# Patient Record
Sex: Female | Born: 1945
Health system: Southern US, Community
[De-identification: ages and names within clinical notes are randomized; demographics above are authoritative.]

## PROBLEM LIST (undated history)

## (undated) DIAGNOSIS — Z9289 Personal history of other medical treatment: Secondary | ICD-10-CM

## (undated) DIAGNOSIS — R112 Nausea with vomiting, unspecified: Secondary | ICD-10-CM

## (undated) DIAGNOSIS — Z923 Personal history of irradiation: Secondary | ICD-10-CM

## (undated) DIAGNOSIS — B1910 Unspecified viral hepatitis B without hepatic coma: Secondary | ICD-10-CM

## (undated) DIAGNOSIS — C679 Malignant neoplasm of bladder, unspecified: Secondary | ICD-10-CM

## (undated) DIAGNOSIS — F329 Major depressive disorder, single episode, unspecified: Secondary | ICD-10-CM

## (undated) DIAGNOSIS — C50312 Malignant neoplasm of lower-inner quadrant of left female breast: Secondary | ICD-10-CM

## (undated) DIAGNOSIS — J449 Chronic obstructive pulmonary disease, unspecified: Secondary | ICD-10-CM

## (undated) DIAGNOSIS — I10 Essential (primary) hypertension: Secondary | ICD-10-CM

## (undated) DIAGNOSIS — Z9889 Other specified postprocedural states: Secondary | ICD-10-CM

## (undated) DIAGNOSIS — C50919 Malignant neoplasm of unspecified site of unspecified female breast: Secondary | ICD-10-CM

## (undated) DIAGNOSIS — Z9221 Personal history of antineoplastic chemotherapy: Secondary | ICD-10-CM

## (undated) DIAGNOSIS — F419 Anxiety disorder, unspecified: Secondary | ICD-10-CM

## (undated) DIAGNOSIS — K219 Gastro-esophageal reflux disease without esophagitis: Secondary | ICD-10-CM

## (undated) DIAGNOSIS — E785 Hyperlipidemia, unspecified: Secondary | ICD-10-CM

## (undated) DIAGNOSIS — F32A Depression, unspecified: Secondary | ICD-10-CM

## (undated) DIAGNOSIS — F319 Bipolar disorder, unspecified: Secondary | ICD-10-CM

## (undated) HISTORY — DX: Unspecified viral hepatitis B without hepatic coma: B19.10

## (undated) HISTORY — DX: Depression, unspecified: F32.A

## (undated) HISTORY — DX: Malignant neoplasm of unspecified site of unspecified female breast: C50.919

## (undated) HISTORY — DX: Hyperlipidemia, unspecified: E78.5

## (undated) HISTORY — DX: Malignant neoplasm of bladder, unspecified: C67.9

## (undated) HISTORY — DX: Gastro-esophageal reflux disease without esophagitis: K21.9

## (undated) HISTORY — PX: ABDOMINAL HYSTERECTOMY: SHX81

## (undated) HISTORY — DX: Personal history of other medical treatment: Z92.89

## (undated) HISTORY — DX: Bipolar disorder, unspecified: F31.9

## (undated) HISTORY — DX: Major depressive disorder, single episode, unspecified: F32.9

## (undated) HISTORY — DX: Malignant neoplasm of lower-inner quadrant of left female breast: C50.312

---
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
## 1989-10-21 HISTORY — PX: PARTIAL HYSTERECTOMY: SHX80

## 1999-08-31 ENCOUNTER — Inpatient Hospital Stay (HOSPITAL_COMMUNITY): Admission: AD | Admit: 1999-08-31 | Discharge: 1999-09-03 | Payer: Self-pay | Admitting: *Deleted

## 1999-09-04 ENCOUNTER — Other Ambulatory Visit: Admission: RE | Admit: 1999-09-04 | Discharge: 1999-09-10 | Payer: Self-pay

## 2004-10-21 HISTORY — PX: OTHER SURGICAL HISTORY: SHX169

## 2005-10-21 HISTORY — PX: BREAST LUMPECTOMY: SHX2

## 2007-04-21 ENCOUNTER — Emergency Department (HOSPITAL_COMMUNITY): Admission: EM | Admit: 2007-04-21 | Discharge: 2007-04-21 | Payer: Self-pay | Admitting: Family Medicine

## 2011-04-17 ENCOUNTER — Encounter: Payer: Self-pay | Admitting: Family Medicine

## 2011-04-17 ENCOUNTER — Ambulatory Visit (INDEPENDENT_AMBULATORY_CARE_PROVIDER_SITE_OTHER): Payer: Medicare Other | Admitting: Family Medicine

## 2011-04-17 DIAGNOSIS — E119 Type 2 diabetes mellitus without complications: Secondary | ICD-10-CM

## 2011-04-17 DIAGNOSIS — J45909 Unspecified asthma, uncomplicated: Secondary | ICD-10-CM

## 2011-04-17 DIAGNOSIS — F319 Bipolar disorder, unspecified: Secondary | ICD-10-CM | POA: Insufficient documentation

## 2011-04-17 DIAGNOSIS — E785 Hyperlipidemia, unspecified: Secondary | ICD-10-CM

## 2011-04-17 DIAGNOSIS — K219 Gastro-esophageal reflux disease without esophagitis: Secondary | ICD-10-CM

## 2011-04-17 DIAGNOSIS — E1165 Type 2 diabetes mellitus with hyperglycemia: Secondary | ICD-10-CM | POA: Insufficient documentation

## 2011-04-17 DIAGNOSIS — C50919 Malignant neoplasm of unspecified site of unspecified female breast: Secondary | ICD-10-CM

## 2011-04-17 DIAGNOSIS — C679 Malignant neoplasm of bladder, unspecified: Secondary | ICD-10-CM | POA: Insufficient documentation

## 2011-04-17 LAB — CBC WITH DIFFERENTIAL/PLATELET
Basophils Absolute: 0 10*3/uL (ref 0.0–0.1)
Basophils Relative: 0.5 % (ref 0.0–3.0)
Eosinophils Absolute: 0.2 10*3/uL (ref 0.0–0.7)
Eosinophils Relative: 3.5 % (ref 0.0–5.0)
HCT: 42.1 % (ref 36.0–46.0)
Hemoglobin: 14.3 g/dL (ref 12.0–15.0)
Lymphocytes Relative: 24.4 % (ref 12.0–46.0)
Lymphs Abs: 1.2 10*3/uL (ref 0.7–4.0)
MCHC: 33.9 g/dL (ref 30.0–36.0)
MCV: 95.6 fl (ref 78.0–100.0)
Monocytes Absolute: 0.4 10*3/uL (ref 0.1–1.0)
Monocytes Relative: 8.4 % (ref 3.0–12.0)
Neutro Abs: 3.1 10*3/uL (ref 1.4–7.7)
Neutrophils Relative %: 63.2 % (ref 43.0–77.0)
Platelets: 158 10*3/uL (ref 150.0–400.0)
RBC: 4.4 Mil/uL (ref 3.87–5.11)
RDW: 13.2 % (ref 11.5–14.6)
WBC: 4.9 10*3/uL (ref 4.5–10.5)

## 2011-04-17 LAB — LIPID PANEL
Cholesterol: 198 mg/dL (ref 0–200)
HDL: 45.1 mg/dL (ref >39.00)
LDL Cholesterol: 137 mg/dL — ABNORMAL HIGH (ref 0–99)
Total CHOL/HDL Ratio: 4
Triglycerides: 82 mg/dL (ref 0.0–149.0)
VLDL: 16.4 mg/dL (ref 0.0–40.0)

## 2011-04-17 LAB — BASIC METABOLIC PANEL
BUN: 10 mg/dL (ref 6–23)
CO2: 25 mEq/L (ref 19–32)
Calcium: 8.8 mg/dL (ref 8.4–10.5)
Chloride: 109 mEq/L (ref 96–112)
Creatinine, Ser: 0.7 mg/dL (ref 0.4–1.2)
GFR: 97.22 mL/min (ref >60.00)
Glucose, Bld: 110 mg/dL — ABNORMAL HIGH (ref 70–99)
Potassium: 4.3 mEq/L (ref 3.5–5.1)
Sodium: 142 mEq/L (ref 135–145)

## 2011-04-17 LAB — HEPATIC FUNCTION PANEL
ALT: 30 U/L (ref 0–35)
AST: 27 U/L (ref 0–37)
Albumin: 4 g/dL (ref 3.5–5.2)
Alkaline Phosphatase: 54 U/L (ref 39–117)
Bilirubin, Direct: 0.1 mg/dL (ref 0.0–0.3)
Total Bilirubin: 0.6 mg/dL (ref 0.3–1.2)
Total Protein: 6.3 g/dL (ref 6.0–8.3)

## 2011-04-17 LAB — HEMOGLOBIN A1C: Hgb A1c MFr Bld: 6.7 % — ABNORMAL HIGH (ref 4.6–6.5)

## 2011-04-17 LAB — TSH: TSH: 1.03 u[IU]/mL (ref 0.35–5.50)

## 2011-04-17 MED ORDER — ALBUTEROL SULFATE HFA 108 (90 BASE) MCG/ACT IN AERS: 2.0000 | INHALATION_SPRAY | RESPIRATORY_TRACT | Status: DC | PRN

## 2011-04-17 MED ORDER — ATORVASTATIN CALCIUM 20 MG PO TABS
20.0000 mg | ORAL_TABLET | Freq: Every day | ORAL | Status: DC
Start: 2011-04-17 — End: 2011-07-25

## 2011-04-17 NOTE — Patient Instructions (Signed)
Please schedule your complete physical in the next 4-6 weeks We'll notify you of your lab results Someone will call you with your urology and oncology appts Call with any questions or concerns Welcome!  We're glad to have you!

## 2011-04-17 NOTE — Progress Notes (Signed)
  Subjective:    Patient ID: Michelle Long, female    DOB: 1946/04/29, 65 y.o.   MRN: [id number]  HPI New to establish care.  Previous MD- Landry Mellow at [place].  Bipolar- sees Dr Betti Cruz and a therapist regularly for this.  On Lamictal, klonopin, fanapt.  Feels sxs are currently well controlled.  Bladder Cancer- dx'd 2006, was seeing Dr Sabino Gasser in [place].  Was seeing him every 6-12 months.  S/p chemo txs.  Would prefer to see [facility].  Breast Cancer- dx'd 2007, was seeing Dr Fredricka Bonine at [facility].  Currently on Tamoxifen.  Had lumpectomy, chemo and radiation.  Was following w/ her every 6 months.  Would like to see new oncologist.  DM- reports this is secondary to antipsychotics.  dx'd 2 yrs ago.  Not currently on meds, reports 'A1C is great'.  Controlling sugars w/ diet and exercise.  Rarely checks sugars at home.  Does get yearly eye exams.  Denies neuropathy.  Hyperlipidemia- chronic problem for pt.  Previously on Pravachol but has not taken this 'in a long time'.  Asthma/COPD- chronic problem for pt, rarely uses albuterol but takes Symbicort daily.  Was having severe problems w/ GERD.    Review of Systems For ROS see HPI     Objective:   Physical Exam  Constitutional: She is oriented to person, place, and time. She appears well-developed and well-nourished. No distress.  HENT:  Head: Normocephalic and atraumatic.  Eyes: Conjunctivae and EOM are normal. Pupils are equal, round, and reactive to light.  Neck: Normal range of motion. Neck supple. No thyromegaly present.  Cardiovascular: Normal rate, regular rhythm, normal heart sounds and intact distal pulses.   No murmur heard. Pulmonary/Chest: Effort normal and breath sounds normal. No respiratory distress.  Abdominal: Soft. She exhibits no distension. There is no tenderness.  Musculoskeletal: She exhibits no edema.  Lymphadenopathy:    She has no cervical adenopathy.  Neurological: She is alert and oriented to person,  place, and time.  Skin: Skin is warm and dry.  Psychiatric: She has a normal mood and affect. Her behavior is normal.          Assessment & Plan:

## 2011-04-30 NOTE — Assessment & Plan Note (Signed)
2/2 antipsychotic use and weight gain.  Pt reports she has been managing this w/ diet and exercise.  Check labs.  Start meds prn.

## 2011-04-30 NOTE — Assessment & Plan Note (Signed)
Following w/ psych.  sxs currently well controlled per pt report.  No changes.

## 2011-04-30 NOTE — Assessment & Plan Note (Signed)
Pt reports this was a severe problem for her but is now doing well on protonix.  Currently sx free.  Continue meds.

## 2011-04-30 NOTE — Assessment & Plan Note (Signed)
Pt prefers to establish care in [place]- refer to oncology.

## 2011-04-30 NOTE — Assessment & Plan Note (Signed)
Check labs to assess starting point and prescribe appropriate statin based on results.  LDL goal i <70 w/ DM

## 2011-04-30 NOTE — Assessment & Plan Note (Signed)
Pt rarely using rescue inhaler.  Taking symbicort daily as controller med.  She is not sure if she truly has asthma or her lung problem was due to her severe GERD at the time.  May need PFTs in the future to assess.

## 2011-04-30 NOTE — Assessment & Plan Note (Signed)
Was following w/ urology in [place].  Would prefer to transfer her care to [place].  Will refer to [facility].

## 2011-05-01 ENCOUNTER — Other Ambulatory Visit: Payer: Self-pay | Admitting: Family Medicine

## 2011-05-01 MED ORDER — BUDESONIDE-FORMOTEROL FUMARATE 160-4.5 MCG/ACT IN AERO: 2.0000 | INHALATION_SPRAY | Freq: Two times a day (BID) | RESPIRATORY_TRACT | Status: DC

## 2011-05-01 NOTE — Telephone Encounter (Signed)
Refill sent.

## 2011-05-09 ENCOUNTER — Other Ambulatory Visit: Payer: Self-pay | Admitting: Family Medicine

## 2011-05-09 ENCOUNTER — Ambulatory Visit: Payer: Medicare Other | Admitting: Hematology & Oncology

## 2011-05-09 DIAGNOSIS — Z9889 Other specified postprocedural states: Secondary | ICD-10-CM

## 2011-05-09 DIAGNOSIS — Z853 Personal history of malignant neoplasm of breast: Secondary | ICD-10-CM

## 2011-05-15 ENCOUNTER — Ambulatory Visit: Payer: Self-pay | Admitting: Family Medicine

## 2011-05-15 ENCOUNTER — Ambulatory Visit (INDEPENDENT_AMBULATORY_CARE_PROVIDER_SITE_OTHER): Payer: Medicare Other | Admitting: Family Medicine

## 2011-05-15 ENCOUNTER — Telehealth: Payer: Self-pay | Admitting: *Deleted

## 2011-05-15 VITALS — BP 120/74 | Temp 98.7°F | Wt 167.0 lb

## 2011-05-15 DIAGNOSIS — R300 Dysuria: Secondary | ICD-10-CM

## 2011-05-15 DIAGNOSIS — L853 Xerosis cutis: Secondary | ICD-10-CM

## 2011-05-15 DIAGNOSIS — R238 Other skin changes: Secondary | ICD-10-CM

## 2011-05-15 LAB — POCT URINALYSIS DIPSTICK
Bilirubin, UA: NEGATIVE
Glucose, UA: NEGATIVE
Ketones, UA: NEGATIVE
Nitrite, UA: NEGATIVE
Protein, UA: NEGATIVE
Spec Grav, UA: 1.02
Urobilinogen, UA: 0.2
pH, UA: 5

## 2011-05-15 MED ORDER — CIPROFLOXACIN HCL 500 MG PO TABS: 500.0000 mg | ORAL_TABLET | Freq: Two times a day (BID) | ORAL | Status: AC

## 2011-05-15 NOTE — Telephone Encounter (Signed)
Pt has pending appt with Dr Beverely Low @ 9:30 am today.

## 2011-05-15 NOTE — Assessment & Plan Note (Signed)
Itching is most likely due to pt's dry skin.  Reviewed that in excessive humidity she will need to moisturize skin due to insensible losses.  Pt understands.

## 2011-05-15 NOTE — Progress Notes (Signed)
  Subjective:    Patient ID: Michelle Long, female    DOB: September 03, 1946, 65 y.o.   MRN: [id number]  HPI ? UTI- sxs started yesterday w/ dysuria.  Started drinking increased fluids and cranberry juice w/ some relief this AM.  Was having increased frequency and urgency overnight.  Hx of similar.  Diffuse intermittent body itching- has started just recently.  No pattern to sxs.  Review of Systems For ROS see HPI     Objective:   Physical Exam  Constitutional: She appears well-developed and well-nourished. No distress.  Abdominal: Soft. Bowel sounds are normal. She exhibits no distension. There is no tenderness. There is no rebound.  Skin: Skin is warm and dry. No rash noted.          Assessment & Plan:

## 2011-05-15 NOTE — Patient Instructions (Signed)
Take the cipro as directed for the UTI Drink plenty of fluids Make sure to keep your skin moisturized to prevent itching Call with any questions or concerns Have a great summer!!!

## 2011-05-15 NOTE — Assessment & Plan Note (Signed)
UA consistent w/ infxn.  Start abx and send urine for cx.

## 2011-05-15 NOTE — Telephone Encounter (Signed)
[facility] Triage Call Report Triage Record Num: [id number] Operator: Karma Greaser Patient Name: Michelle Long Call Date & Time: 05/14/2011 6:40:33PM Patient Phone: [phone] PCP: Patient Gender: Female PCP Fax : Patient DOB: 1946-04-03 Practice Name: [facility] Reason for Call: Urinary symptoms began 05/11/11 but worsening 05/13/11. Burning and not able to pass all of her urine. All emergent s/s of Urinary Symptoms r/o except has one or more urinary tract symptoms and has not been previously evaluated. Told her to see Provider in 24 hours. Protocol(s) Used: Urinary Symptoms - Female Recommended Outcome per Protocol: See Provider within 24 hours Reason for Outcome: Has one or more urinary tract symptoms AND has not been previously evaluated Care Advice: ~ Tell provider medical history of renal disease; especially if have only one kidney. ~ Call provider if you develop flank or low back pain, fever, generally feel sick. Increase intake of fluids. Try to drink 8 oz. (.2 liter) every hour when awake, including unsweetened cranberry juice, unless on restricted fluids for other medical reasons. Take sips of fluid or eat ice chips if nauseated or vomiting. ~ ~ Tell your provider if you are taking Warfarin (Coumadin) and drinking cranberry juice or taking cranberry capsules. ~ SYMPTOM / CONDITION MANAGEMENT ~ CAUTIONS Limit carbonated, alcoholic, and caffeinated beverages such as coffee, tea and soda. Avoid nonprescription cold and allergy medications that contain caffeine. Limit intake of tomatoes, fruit juices (except for unsweetened cranberry juice), dairy products, spicy foods, sugar, and artificial sweeteners (aspartame or saccharine). Stop or decrease smoking. Reducing exposure to bladder irritants may help lessen urgency. ~ Analgesic/Antipyretic Advice - Acetaminophen: Consider acetaminophen as directed on label or by pharmacist/provider for pain or  fever PRECAUTIONS: - Use if there is no history of liver disease, alcoholism, or intake of three or more alcohol drinks per day - Only if approved by provider during pregnancy or when breastfeeding - During pregnancy, acetaminophen should not be taken more than 3 consecutive days without telling provider - Do not exceed recommended dose or frequency ~ ~ Call provider if urine is pink, red, smoky or cola colored. Systemic Inflammatory Response Syndrome (SIRS): Watch for signs of a generalized, whole body infection. Occurs within days of a localized infection, especially of the urinary, GI, respiratory or nervous systems; or after a traumatic injury or invasive procedure. - Call EMS 911 if symptoms have worsened, such as increasing confusion or unusual drowsiness; cold and clammy skin; no urine output; rapid respiration (>30/min.) or slow respiration (<10/min.); struggling to breathe. - Go to the ED immediately for early symptoms of rapid pulse >90/min. or rapid breathing >20/min. at rest; chills; oral temperature >100.4 F (38 C) or <96.8 F (36 C) when associated with conditions noted. ~ Analgesic/Antipyretic Advice - NSAIDs: Consider aspirin, ibuprofen, naproxen or ketoprofen for pain or fever as directed on label or by pharmacist/provider. PRECAUTIONS: - If over 43 years of age, should not take longer than 1 week without consulting provider. EXCEPTIONS: - Should not be used if taking blood thinners or have bleeding problems. - Do not use if have history of sensitivity/allergy to any of these medications; or history of cardiovascular, ulcer, kidney, liver disease or diabetes unless approved by provider.

## 2011-05-17 ENCOUNTER — Ambulatory Visit
Admission: RE | Admit: 2011-05-17 | Discharge: 2011-05-17 | Disposition: A | Discharge status: Home / Self Care | Payer: Medicare Other | Source: Ambulatory Visit | Attending: Family Medicine | Admitting: Family Medicine

## 2011-05-17 DIAGNOSIS — Z9889 Other specified postprocedural states: Secondary | ICD-10-CM

## 2011-05-17 DIAGNOSIS — Z853 Personal history of malignant neoplasm of breast: Secondary | ICD-10-CM

## 2011-05-17 LAB — URINE CULTURE

## 2011-05-29 ENCOUNTER — Ambulatory Visit (INDEPENDENT_AMBULATORY_CARE_PROVIDER_SITE_OTHER): Payer: Medicare Other | Admitting: Family Medicine

## 2011-05-29 ENCOUNTER — Encounter: Payer: Self-pay | Admitting: Family Medicine

## 2011-05-29 DIAGNOSIS — F319 Bipolar disorder, unspecified: Secondary | ICD-10-CM

## 2011-05-29 DIAGNOSIS — E559 Vitamin D deficiency, unspecified: Secondary | ICD-10-CM | POA: Insufficient documentation

## 2011-05-29 DIAGNOSIS — Z8742 Personal history of other diseases of the female genital tract: Secondary | ICD-10-CM | POA: Insufficient documentation

## 2011-05-29 DIAGNOSIS — Z0000 Encounter for general adult medical examination without abnormal findings: Secondary | ICD-10-CM

## 2011-05-29 DIAGNOSIS — Z136 Encounter for screening for cardiovascular disorders: Secondary | ICD-10-CM

## 2011-05-29 NOTE — Patient Instructions (Signed)
Follow up in 2 months to recheck diabetes- sooner if needed We'll call you with your bone density and GYN appt Continue your current meds Your exam looks great!  Keep up the good work! Call with any questions or concerns Hang in there!  You're doing great!

## 2011-05-29 NOTE — Progress Notes (Signed)
  Subjective:    Patient ID: Michelle Long, female    DOB: 1945/12/09, 65 y.o.   MRN: [id number]  HPI Here today for CPE.  Risk Factors: Depression/Anxiety- pt admits to increased difficulty recently, has not been seeing therapist regularly- plans on making appt.  Is not taking Fanapt as directed due to sleepiness.  Feels this is why BP is elevated.  Admits to increased anxiety. Vit D deficiency- taking 1000 IUs daily in form of supplement, plus 200 IUs in Calcium supplement.  Due for DEXA scan. Physical Activity: walking regularly Fall risk: low Depression: ongoing issue, sees Psych regularly Hearing: no concerns, normal to whispered voice at 6 ft ADL's: independent Cognitive: normal linear thought process, no deficits noted Home Safety: feels safe at home, lives alone. Height, Weight, BMI, Visual Acuity: see vitals, vision corrected to 20/20 w/ glasses Counseling: UTD on colonoscopy, mammograms.  Overdue on pap and dexa.  Has had Pneumovax. Labs Ordered: See A&P Care Plan: See A&P    Review of Systems Patient reports no vision/ hearing changes, adenopathy,fever, weight change,  persistant/recurrent hoarseness , swallowing issues, chest pain, palpitations, edema, persistant/recurrent cough, hemoptysis, dyspnea (rest/exertional/paroxysmal nocturnal), gastrointestinal bleeding (melena, rectal bleeding), abdominal pain, significant heartburn, bowel changes, GU symptoms (dysuria, hematuria, incontinence), Gyn symptoms (abnormal  bleeding, pain),  syncope, focal weakness, memory loss, numbness & tingling, skin/hair/nail changes, abnormal bruising or bleeding.    Objective:   Physical Exam  General Appearance:    Alert, cooperative, no distress, appears stated age  Head:    Normocephalic, without obvious abnormality, atraumatic  Eyes:    PERRL, conjunctiva/corneas clear, EOM's intact, fundi    benign, both eyes  Ears:    Normal TM's and external ear canals, both ears  Nose:   Nares normal,  septum midline, mucosa normal, no drainage    or sinus tenderness  Throat:   Lips, mucosa, and tongue normal; teeth and gums normal  Neck:   Supple, symmetrical, trachea midline, no adenopathy;    Thyroid: no enlargement/tenderness/nodules  Back:     Symmetric, no curvature, ROM normal, no CVA tenderness  Lungs:     Clear to auscultation bilaterally, respirations unlabored  Chest Wall:    No tenderness or deformity   Heart:    Regular rate and rhythm, S1 and S2 normal, no murmur, rub   or gallop  Breast Exam:    Deferred to GYN  Abdomen:     Soft, non-tender, bowel sounds active all four quadrants,    no masses, no organomegaly  Genitalia:    Deferred to GYN  Rectal:    Deferred to GYN  Extremities:   Extremities normal, atraumatic, no cyanosis or edema  Pulses:   2+ and symmetric all extremities  Skin:   Skin color, texture, turgor normal, no rashes or lesions  Lymph nodes:   Cervical, supraclavicular, and axillary nodes normal  Neurologic:   CNII-XII intact, normal strength, sensation and reflexes    throughout          Assessment & Plan:

## 2011-05-30 ENCOUNTER — Encounter (HOSPITAL_BASED_OUTPATIENT_CLINIC_OR_DEPARTMENT_OTHER): Payer: Medicare Other | Admitting: Hematology & Oncology

## 2011-05-30 ENCOUNTER — Other Ambulatory Visit: Payer: Self-pay | Admitting: Hematology & Oncology

## 2011-05-30 DIAGNOSIS — C50419 Malignant neoplasm of upper-outer quadrant of unspecified female breast: Secondary | ICD-10-CM

## 2011-05-30 LAB — CBC WITH DIFFERENTIAL (CANCER CENTER ONLY)
BASO#: 0 10*3/uL (ref 0.0–0.2)
BASO%: 0.2 % (ref 0.0–2.0)
EOS%: 1.4 % (ref 0.0–7.0)
Eosinophils Absolute: 0.2 10*3/uL (ref 0.0–0.5)
HCT: 40.3 % (ref 34.8–46.6)
HGB: 14.1 g/dL (ref 11.6–15.9)
LYMPH#: 1.7 10*3/uL (ref 0.9–3.3)
LYMPH%: 16.2 % (ref 14.0–48.0)
MCH: 32.5 pg (ref 26.0–34.0)
MCHC: 35 g/dL (ref 32.0–36.0)
MCV: 93 fL (ref 81–101)
MONO#: 0.8 10*3/uL (ref 0.1–0.9)
MONO%: 7.2 % (ref 0.0–13.0)
NEUT#: 7.8 10*3/uL — ABNORMAL HIGH (ref 1.5–6.5)
NEUT%: 75 % (ref 39.6–80.0)
Platelets: 145 10*3/uL (ref 145–400)
RBC: 4.34 10*6/uL (ref 3.70–5.32)
RDW: 12 % (ref 11.1–15.7)
WBC: 10.4 10*3/uL — ABNORMAL HIGH (ref 3.9–10.0)

## 2011-05-30 LAB — COMPREHENSIVE METABOLIC PANEL
ALT: 30 U/L (ref 0–35)
AST: 24 U/L (ref 0–37)
Albumin: 4.3 g/dL (ref 3.5–5.2)
Alkaline Phosphatase: 64 U/L (ref 39–117)
BUN: 12 mg/dL (ref 6–23)
CO2: 25 mEq/L (ref 19–32)
Calcium: 9.7 mg/dL (ref 8.4–10.5)
Chloride: 107 mEq/L (ref 96–112)
Creatinine, Ser: 0.75 mg/dL (ref 0.50–1.10)
Glucose, Bld: 95 mg/dL (ref 70–99)
Potassium: 3.9 mEq/L (ref 3.5–5.3)
Sodium: 139 mEq/L (ref 135–145)
Total Bilirubin: 0.4 mg/dL (ref 0.3–1.2)
Total Protein: 6.1 g/dL (ref 6.0–8.3)

## 2011-05-30 LAB — VITAMIN D 25 HYDROXY (VIT D DEFICIENCY, FRACTURES): Vit D, 25-Hydroxy: 28 ng/mL — ABNORMAL LOW (ref 30–89)

## 2011-05-31 ENCOUNTER — Telehealth: Payer: Self-pay

## 2011-05-31 LAB — VITAMIN D 1,25 DIHYDROXY
Vitamin D 1, 25 (OH)2 Total: 86 pg/mL — ABNORMAL HIGH (ref 18–72)
Vitamin D2 1, 25 (OH)2: <8 pg/mL
Vitamin D3 1, 25 (OH)2: 86 pg/mL

## 2011-05-31 NOTE — Telephone Encounter (Signed)
Message copied by Beverely Low on Fri May 31, 2011  3:31 PM ------      Message from: Sheliah Hatch      Created: Fri May 31, 2011  8:01 AM       Vit D is elevated- should decrease the amount of her supplement.  She is taking 1000 units daily + her Ca/D + multi vitamin.  Can stop the 1000 units daily for now and will recheck in 6 months.

## 2011-05-31 NOTE — Telephone Encounter (Signed)
Pt.notified

## 2011-06-02 NOTE — Assessment & Plan Note (Signed)
Pt's PE WNL.  Reviewed labs from last visit.  Only thing missing is Vit D level- will get this today.  UTD on health maintenance- w/ exception of pap and dexa.  Will order DEXA and refer to GYN for pap due to hx of abnormal paps and hx of cancer.  Pt expressed understanding and is in agreement w/ plan.

## 2011-06-02 NOTE — Assessment & Plan Note (Signed)
Refer to GYN. 

## 2011-06-02 NOTE — Assessment & Plan Note (Signed)
Check labs and get DEXA

## 2011-06-02 NOTE — Assessment & Plan Note (Signed)
Pt extremely anxious over recently changing doctors.  Says she needed to change for a reason she won't divulge but this makes her very nervous.  Reassured her that the specialists she is seeing are excellent- this made her happy.  Encouraged her to schedule a f/u w/ her therapist- she plans to.  Will follow.

## 2011-06-04 ENCOUNTER — Ambulatory Visit (INDEPENDENT_AMBULATORY_CARE_PROVIDER_SITE_OTHER): Payer: Medicare Other | Admitting: Family

## 2011-06-04 VITALS — BP 142/72 | HR 72 | Temp 98.1°F | Ht 64.0 in | Wt 168.0 lb

## 2011-06-04 DIAGNOSIS — N390 Urinary tract infection, site not specified: Secondary | ICD-10-CM

## 2011-06-04 DIAGNOSIS — R350 Frequency of micturition: Secondary | ICD-10-CM

## 2011-06-04 LAB — POCT URINALYSIS DIPSTICK
Glucose, UA: NEGATIVE
Ketones, UA: NEGATIVE
Nitrite, UA: NEGATIVE
Spec Grav, UA: 1.005
Urobilinogen, UA: 0.2
pH, UA: 6

## 2011-06-04 MED ORDER — CEFUROXIME AXETIL 500 MG PO TABS: 500.0000 mg | ORAL_TABLET | Freq: Two times a day (BID) | ORAL | Status: AC

## 2011-06-04 NOTE — Assessment & Plan Note (Signed)
Symptoms and UA are suggestive of UTI.  Will send urine for culture and plan to treat with ceftin. Pt is instructed to call for f/u as outlined in after visit summary.

## 2011-06-04 NOTE — Patient Instructions (Signed)
Please call if your symptoms worsen or if you develop low back pain, blood in urine, fever >101, or if no improvement in 2-3 days.

## 2011-06-04 NOTE — Progress Notes (Signed)
Subjective:    Patient ID: Michelle Long, female    DOB: July 13, 1946, 65 y.o.   MRN: [id number]  HPI  Michelle Long is a 65 yr old female who presents today with chief complaint of dysuria.  Has hx of bladder cancer 6 yrs ago.  Reports + cystoscopy several weeks ago.  Saw Dr. Beverely Low with dysuria on 7/25 and was treated with cipro for UTI (20 K mult morphotypes).  Symptoms improved briefly, now have returned.  Denies fever.  Deneis low back pain or hematuria.  She does have a sense of pelvic fullness.  Denies vaginal discharge.  She is on Tamoxifen.  Review of Systems See HPI  Past Medical History  Diagnosis Date  . Asthma   . Breast cancer   . Bladder cancer   . Depression   . Bipolar disorder   . Diabetes mellitus   . GERD (gastroesophageal reflux disease)   . Hepatitis B infection   . Hyperlipidemia   . History of transfusion of whole blood     Approximately 2009, due to breast cancer/chemo.    History   Social History  . Marital Status: Divorced    Spouse Name: N/A    Number of Children: N/A  . Years of Education: N/A   Occupational History  . Not on file.   Social History Main Topics  . Smoking status: Former Smoker    Quit date: 08/21/1998  . Smokeless tobacco: Not on file  . Alcohol Use: No  . Drug Use: No  . Sexually Active:    Other Topics Concern  . Not on file   Social History Narrative  . No narrative on file    Past Surgical History  Procedure Date  . Tonsillectomy 1951  . Appendectomy 1961  . Partial hysterectomy 1991  . Bladder cancer 2006  . Breast lumpectomy 2007  . Abdominal hysterectomy     Family History  Problem Relation Age of Onset  . Arthritis Mother   . Breast cancer Mother   . Transient ischemic attack Mother   . Hypertension Mother   . Heart disease Father   . Diabetes Father   . Cancer Father     bladder cancer  . Throat cancer Father     Allergies  Allergen Reactions  . Prozac (Fluoxetine Hcl)     seizure  . Sulfa  Drugs Cross Reactors     Dyspnea     Current Outpatient Prescriptions on File Prior to Visit  Medication Sig Dispense Refill  . atorvastatin (LIPITOR) 20 MG tablet Take 1 tablet (20 mg total) by mouth daily.  30 tablet  3  . budesonide-formoterol (SYMBICORT) 160-4.5 MCG/ACT inhaler Inhale 2 puffs into the lungs 2 (two) times daily.  1 Inhaler  0  . Calcium Carb-Cholecalciferol (CALCIUM 1000 + D PO) Take by mouth daily.        . Cholecalciferol (VITAMIN D) 1000 UNITS capsule Take 3,000 Units by mouth daily.       . clonazePAM (KLONOPIN) 0.5 MG tablet Take 0.5 mg by mouth 2 (two) times daily as needed.        . fish oil-omega-3 fatty acids 1000 MG capsule Take 1 g by mouth daily.        . Iloperidone (FANAPT) 6 MG TABS Take 3 mg by mouth at bedtime as needed.        . lamoTRIgine (LAMICTAL) 100 MG tablet Take 100 mg by mouth daily.        Marland Kitchen  Multiple Vitamin (MULTIVITAMIN) tablet Take 1 tablet by mouth daily.        . pantoprazole (PROTONIX) 40 MG tablet Take 40 mg by mouth daily.        . tamoxifen (NOLVADEX) 20 MG tablet Take 20 mg by mouth daily.          BP 142/72  Pulse 72  Temp(Src) 98.1 F (36.7 C) (Oral)  Ht 5\' 4"  (1.626 m)  Wt 168 lb 0.6 oz (76.222 kg)  BMI 28.84 kg/m2       Objective:   Physical Exam  Constitutional: She appears well-developed and well-nourished.  HENT:  Head: Normocephalic and atraumatic.  Eyes: Conjunctivae are normal.  Cardiovascular: Normal rate and regular rhythm.   No murmur heard. Pulmonary/Chest: Effort normal and breath sounds normal. No respiratory distress. She has no wheezes. She has no rales. She exhibits no tenderness.  Abdominal: Soft. Bowel sounds are normal.       Neg Suprapubic tenderness to palpation  Genitourinary:       Neg CVAT bilaterally  Psychiatric: She has a normal mood and affect. Her speech is normal and behavior is normal. Judgment and thought content normal. Cognition and memory are normal.          Assessment &  Plan:

## 2011-06-05 ENCOUNTER — Other Ambulatory Visit: Payer: Self-pay | Admitting: Family Medicine

## 2011-06-05 NOTE — Telephone Encounter (Signed)
Request denied. Pt advised to schedule office visit with last refill

## 2011-06-06 ENCOUNTER — Other Ambulatory Visit: Payer: Medicare Other

## 2011-06-07 LAB — URINE CULTURE: Colony Count: 75000

## 2011-06-10 MED ORDER — BUDESONIDE-FORMOTEROL FUMARATE 160-4.5 MCG/ACT IN AERO: 2.0000 | INHALATION_SPRAY | Freq: Two times a day (BID) | RESPIRATORY_TRACT | Status: DC

## 2011-06-10 NOTE — Telephone Encounter (Signed)
Addended by: Beverely Low on: 06/10/2011 10:34 AM   Modules accepted: Orders

## 2011-06-11 ENCOUNTER — Telehealth: Payer: Self-pay | Admitting: Family

## 2011-06-11 MED ORDER — NITROFURANTOIN MACROCRYSTAL 100 MG PO CAPS: 100.0000 mg | ORAL_CAPSULE | Freq: Four times a day (QID) | ORAL | Status: AC

## 2011-06-11 NOTE — Telephone Encounter (Signed)
Spoke with pt re: enterococcus UTI and the cephalosporin may not have covered this organism. She reports that she is feeling better.  Will plan to treat with 7 days of macrobid. Pt is aware.

## 2011-06-18 ENCOUNTER — Ambulatory Visit
Admission: RE | Admit: 2011-06-18 | Discharge: 2011-06-18 | Disposition: A | Discharge status: Home / Self Care | Payer: Medicare Other | Source: Ambulatory Visit | Attending: Family Medicine | Admitting: Family Medicine

## 2011-06-19 ENCOUNTER — Encounter: Payer: Medicare Other | Admitting: Family Medicine

## 2011-06-19 ENCOUNTER — Telehealth: Payer: Self-pay

## 2011-06-19 NOTE — Telephone Encounter (Signed)
Left message to notify pt dexa nl. Copy mailed

## 2011-06-19 NOTE — Telephone Encounter (Signed)
Message copied by Beverely Low on Wed Jun 19, 2011 10:58 AM ------      Message from: Sheliah Hatch      Created: Wed Jun 19, 2011  8:04 AM       Normal bone density- this is great news!

## 2011-07-25 ENCOUNTER — Other Ambulatory Visit: Payer: Self-pay | Admitting: Family Medicine

## 2011-07-25 MED ORDER — ATORVASTATIN CALCIUM 20 MG PO TABS: 20.0000 mg | ORAL_TABLET | Freq: Every day | ORAL | Status: DC

## 2011-07-25 NOTE — Telephone Encounter (Signed)
Done

## 2011-08-15 ENCOUNTER — Telehealth: Payer: Self-pay

## 2011-08-15 NOTE — Telephone Encounter (Signed)
msg from patient stating she has a knot on her arm close the elbow.   I called patient back and there was no answer---Msg left to return call       KP

## 2011-08-15 NOTE — Telephone Encounter (Signed)
Discussed with patient and apt has been scheduled for tomorrow. Ok with Dr.Tabori    KP

## 2011-08-16 ENCOUNTER — Ambulatory Visit (INDEPENDENT_AMBULATORY_CARE_PROVIDER_SITE_OTHER): Payer: Medicare Other | Admitting: Family Medicine

## 2011-08-16 ENCOUNTER — Ambulatory Visit (HOSPITAL_BASED_OUTPATIENT_CLINIC_OR_DEPARTMENT_OTHER)
Admission: RE | Admit: 2011-08-16 | Discharge: 2011-08-16 | Disposition: A | Discharge status: Home / Self Care | Payer: Medicare Other | Source: Ambulatory Visit | Attending: Family Medicine | Admitting: Family Medicine

## 2011-08-16 VITALS — BP 140/82 | HR 106 | Temp 98.6°F | Ht 64.0 in | Wt 167.0 lb

## 2011-08-16 DIAGNOSIS — M7989 Other specified soft tissue disorders: Secondary | ICD-10-CM

## 2011-08-16 DIAGNOSIS — R252 Cramp and spasm: Secondary | ICD-10-CM

## 2011-08-16 IMAGING — US US EXTREM  UP VENOUS*R*
1 series · 14 of 24 positions shown · non-contrast
Comparison: None.

CLINICAL DATA: Right upper arm swelling.  History of right-sided
Port-A-Cath.

RIGHT UPPER EXTREMITY VENOUS DUPLEX ULTRASOUND
TECHNIQUE: Gray-scale sonography with graded compression, as well
as color Doppler and duplex ultrasound were performed to evaluate
the upper extremity deep venous system from the level of the
subclavian vein and including the jugular, axillary, basilic and
upper cephalic vein.  Spectral Doppler was utilized to evaluate
flow at rest and with distal augmentation maneuvers.

[Series 1: us extrem up venous*right* · 14 of 31 slices shown]
[im 1/31]
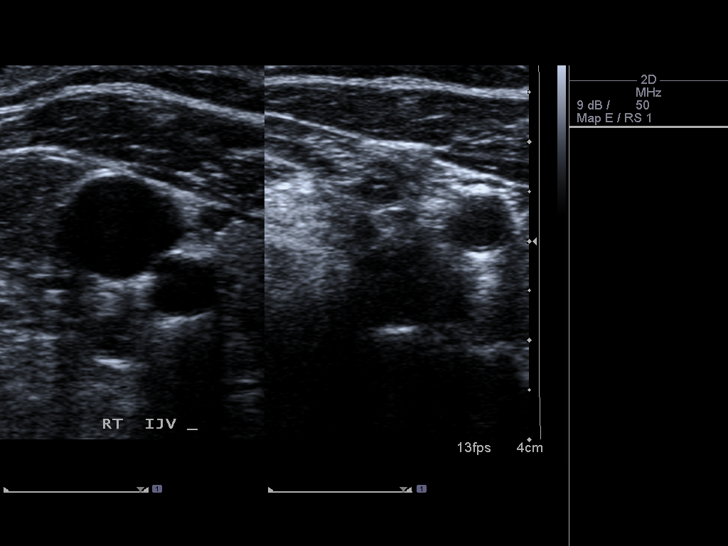
[im 3/31]
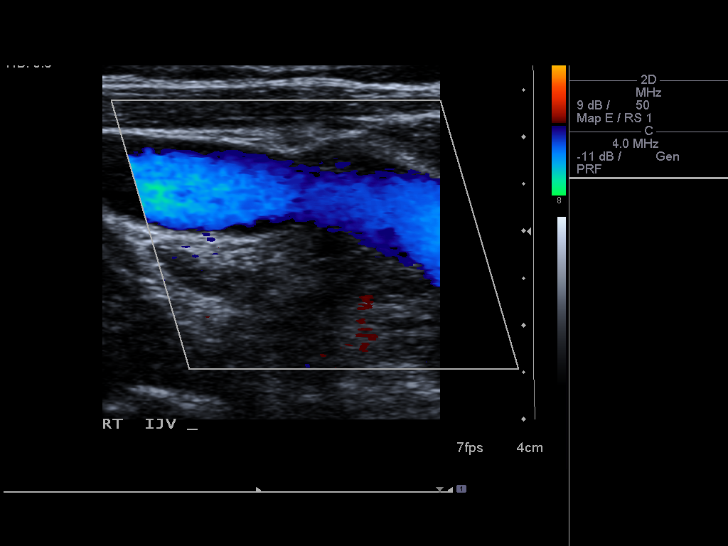
[im 6/31]
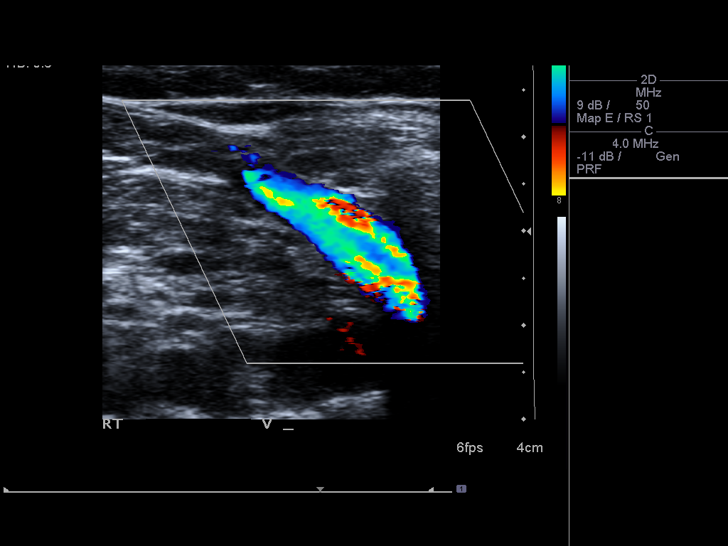
[im 8/31]
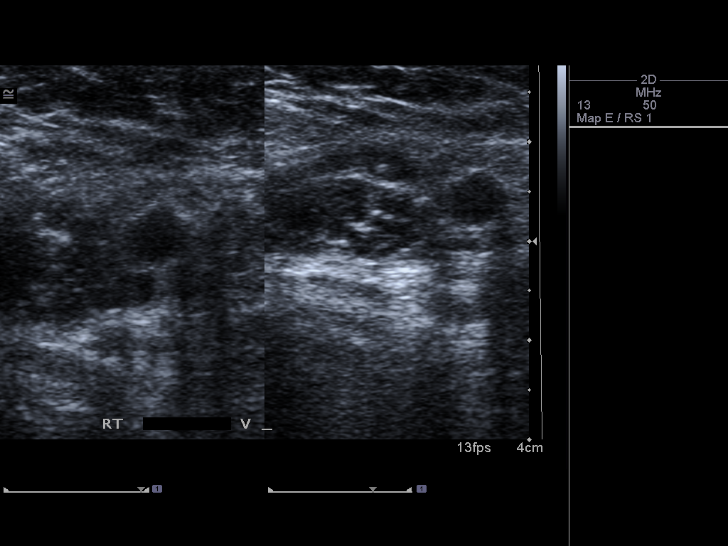
[im 10/31]
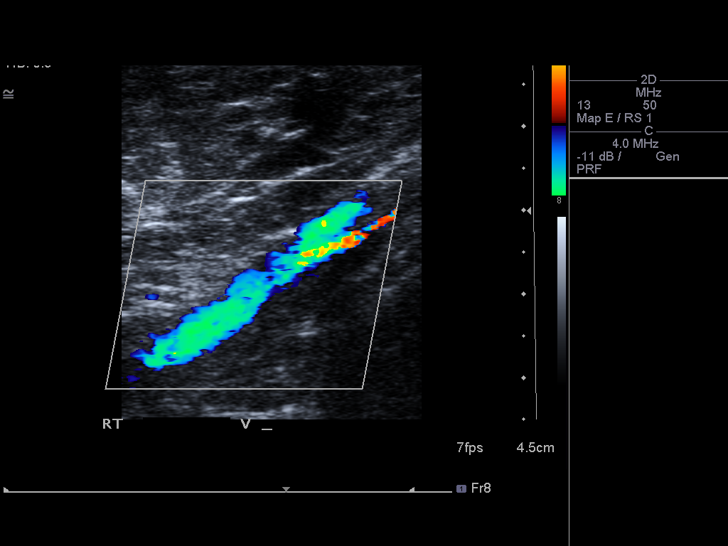
[im 12/31]
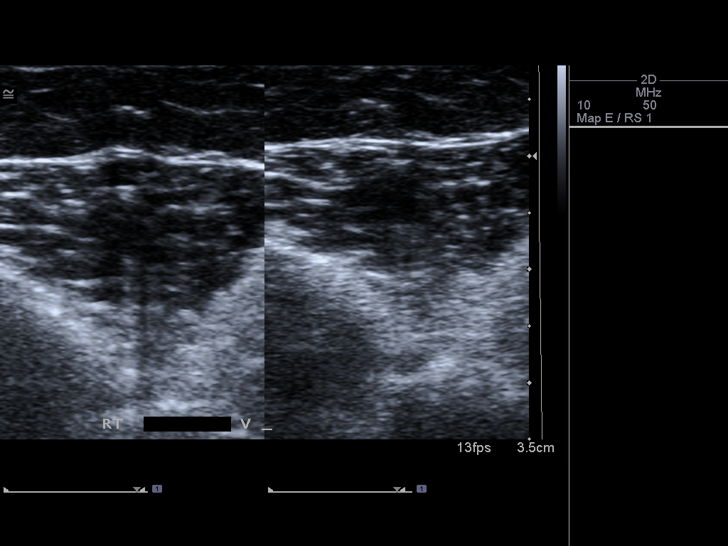
[im 15/31]
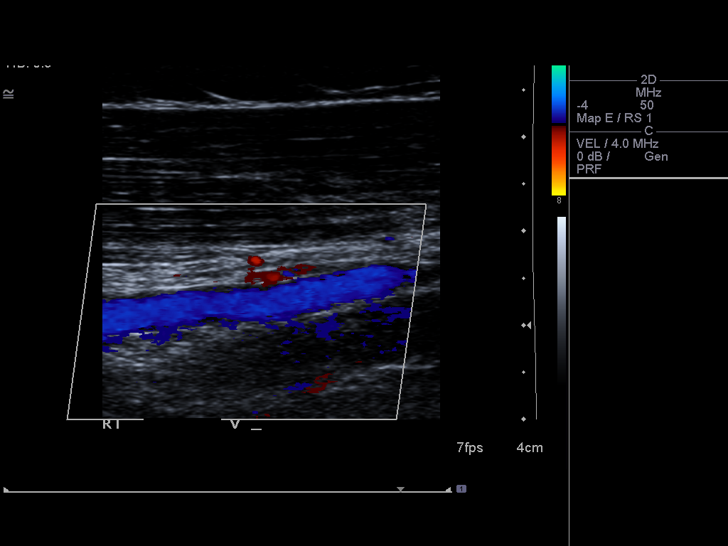
[im 16/31]
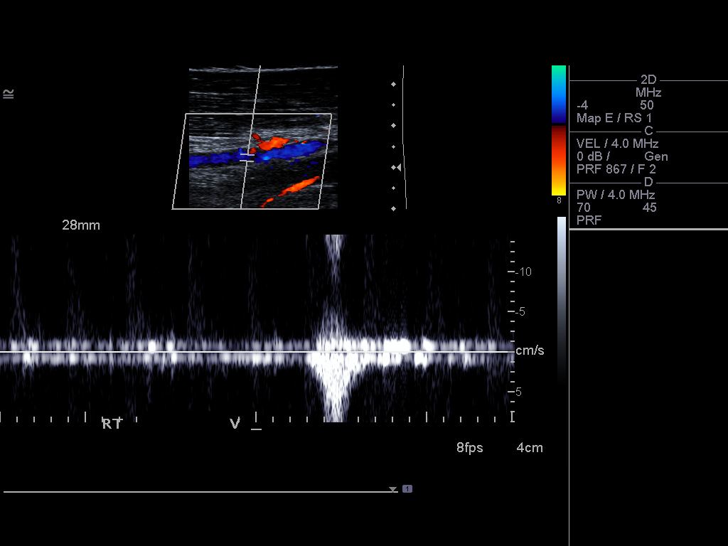
[im 19/31]
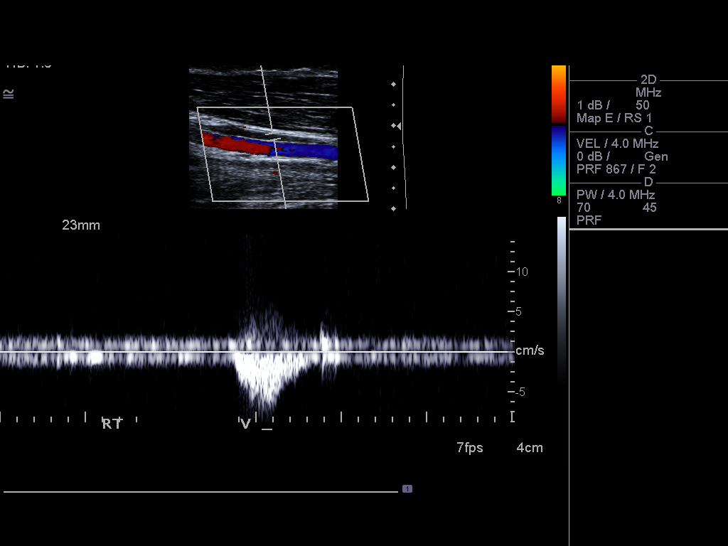
[im 21/31]
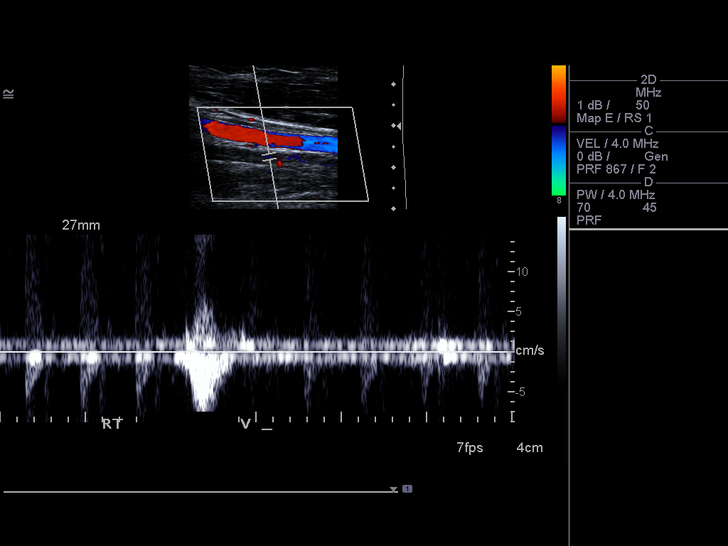
[im 24/31]
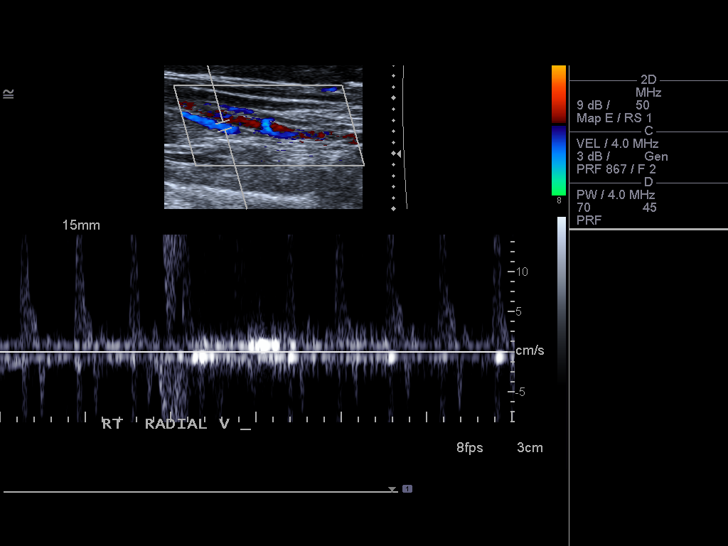
[im 25/31]
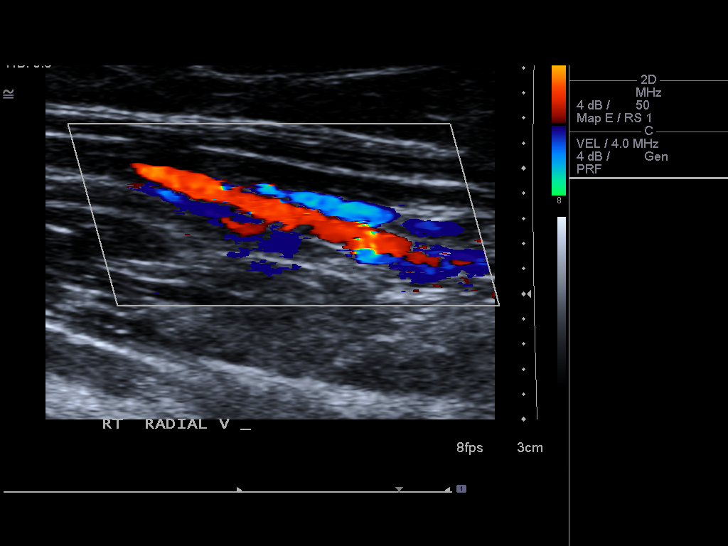
[im 28/31]
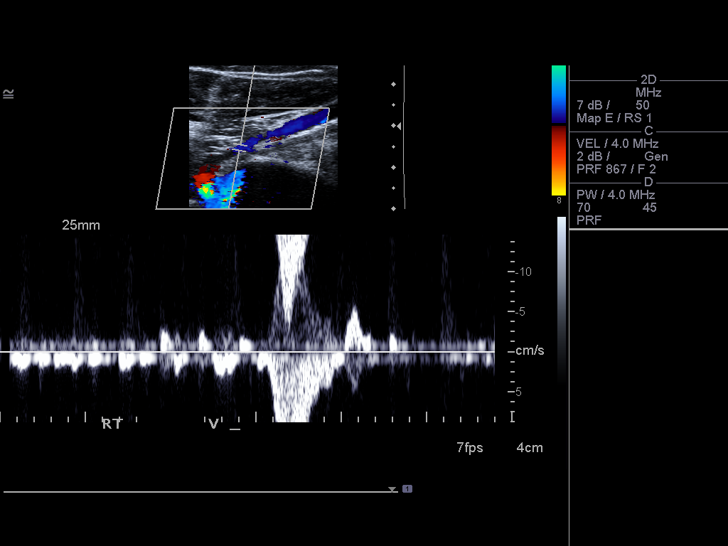
[im 31/31]
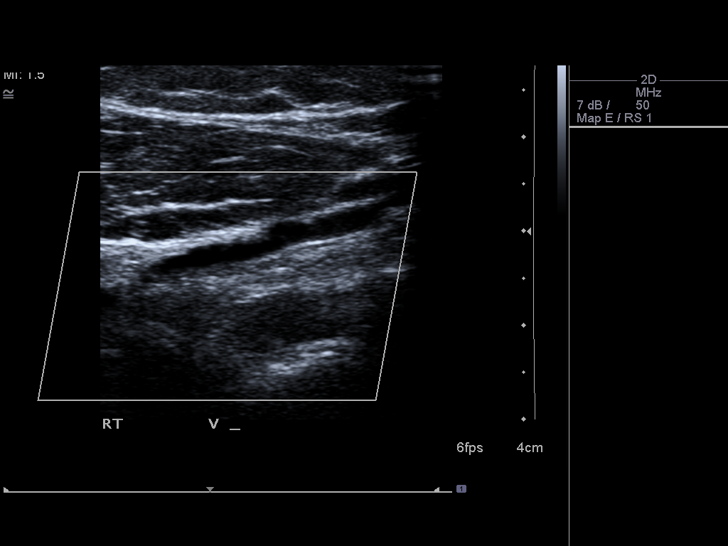

[14 of 24 positions shown; findings below may reference images not displayed]

FINDINGS: Right IJ vein is patent by color flow imaging.  Normal
appearing venous wave form is present in the right internal jugular
vein.

Right subclavian vein is patent by color flow imaging.  Normal
appearing venous wave form is present.

Right axillary, cephalic, basilic, brachial, radial, and ulnar
veins are all compressible.  Doppler analysis demonstrates a normal
appearing venous wave form.  There is augmentation of flow upon
distal compression.
IMPRESSION: No evidence of DVT in the right upper extremity or right internal
jugular vein.

## 2011-08-16 NOTE — Patient Instructions (Signed)
We'll get you scheduled for your [place] and let you know the results Alternate ice or heat to improve swelling Increase the amount of potassium in your diet- citrus fruits, leafy green veggies, bananas, dried fruits, beans, milk Call with any questions or concerns Hang in there!!

## 2011-08-16 NOTE — Progress Notes (Signed)
  Subjective:    Patient ID: Michelle Long, female    DOB: May 19, 1946, 65 y.o.   MRN: [id number]  HPI Arm swelling- R sided, just above elbow crease.  Noticed by daughter yesterday but pt doesn't think this is new.  Not painful.  Not red.  Not warm.  Lower extremity cramping- pt reports this is ongoing problem, thought it was normal part of aging but friends have told her otherwise.   Review of Systems For ROS see HPI     Objective:   Physical Exam  Vitals reviewed. Constitutional: She appears well-developed and well-nourished. No distress.  Musculoskeletal: Normal range of motion. She exhibits edema (soft tissue swelling of R upper arm just above elbow crease, no redness, warmth, pain). She exhibits no tenderness.  Skin: Skin is warm and dry. No erythema.          Assessment & Plan:

## 2011-08-17 DIAGNOSIS — R252 Cramp and spasm: Secondary | ICD-10-CM | POA: Insufficient documentation

## 2011-08-17 NOTE — Assessment & Plan Note (Signed)
Unlikely to be DVT but will r/o w/ doppler.  Soft tissue swelling is more concerning for possible cyst, mass or other underlying abnormality.  Will [place] to assess.  No pain to treat at this time.  Pt to call if sxs change or worsen.

## 2011-08-17 NOTE — Assessment & Plan Note (Signed)
Pt's recent electrolytes have been normal.  Encouraged increased fluid intake, increased K+ in diet.  Pt to notify if sxs don't improve.

## 2011-08-20 ENCOUNTER — Telehealth: Payer: Self-pay | Admitting: Family

## 2011-08-22 ENCOUNTER — Other Ambulatory Visit: Payer: Self-pay | Admitting: *Deleted

## 2011-08-22 MED ORDER — PANTOPRAZOLE SODIUM 40 MG PO TBEC: 40.0000 mg | DELAYED_RELEASE_TABLET | Freq: Every day | ORAL | Status: DC

## 2011-08-23 ENCOUNTER — Telehealth: Payer: Self-pay | Admitting: *Deleted

## 2011-08-23 NOTE — Telephone Encounter (Signed)
rx sent to pharmacy by e-script  

## 2011-08-26 NOTE — Telephone Encounter (Signed)
Pt aware.

## 2011-08-28 ENCOUNTER — Telehealth: Payer: Self-pay | Admitting: Family Medicine

## 2011-08-28 NOTE — Telephone Encounter (Signed)
Patient needs Test strips for one touch glucose monitor - [facility] - [employer]

## 2011-08-29 MED ORDER — GLUCOSE BLOOD VI STRP: ORAL_STRIP | Status: AC

## 2011-08-29 NOTE — Telephone Encounter (Signed)
rx sent to pharmacy by e-script For test strips

## 2011-09-05 ENCOUNTER — Telehealth: Payer: Self-pay | Admitting: *Deleted

## 2011-09-05 NOTE — Telephone Encounter (Signed)
.  left message to have patient return my call. Per pt called to ask about last reesults for A1C however did not notice any recent labs for A1C.

## 2011-09-24 ENCOUNTER — Telehealth: Payer: Self-pay | Admitting: *Deleted

## 2011-09-24 NOTE — Telephone Encounter (Signed)
Pt c/o anxiousness, unable to sleep xweeks, increased heart rate and Pt states that she just doesn't feel good. Pt denies any SOB, chest pain/pressure, numbness or tingling in extremity. Pt advise ED. Pt states that she is currently driving and just saw a UC and will be evaluated there.

## 2011-09-25 ENCOUNTER — Ambulatory Visit: Payer: Medicare Other | Admitting: Family Medicine

## 2011-09-25 ENCOUNTER — Encounter: Payer: Self-pay | Admitting: Family Medicine

## 2011-09-25 ENCOUNTER — Ambulatory Visit (INDEPENDENT_AMBULATORY_CARE_PROVIDER_SITE_OTHER): Payer: Medicare Other | Admitting: Family Medicine

## 2011-09-25 DIAGNOSIS — J45909 Unspecified asthma, uncomplicated: Secondary | ICD-10-CM

## 2011-09-25 DIAGNOSIS — R238 Other skin changes: Secondary | ICD-10-CM

## 2011-09-25 DIAGNOSIS — R252 Cramp and spasm: Secondary | ICD-10-CM

## 2011-09-25 DIAGNOSIS — E785 Hyperlipidemia, unspecified: Secondary | ICD-10-CM

## 2011-09-25 DIAGNOSIS — F319 Bipolar disorder, unspecified: Secondary | ICD-10-CM

## 2011-09-25 LAB — PROTIME-INR
INR: 0.97 (ref <1.50)
Prothrombin Time: 13.3 seconds (ref 11.6–15.2)

## 2011-09-25 MED ORDER — ALBUTEROL SULFATE (2.5 MG/3ML) 0.083% IN NEBU: 2.5000 mg | INHALATION_SOLUTION | Freq: Four times a day (QID) | RESPIRATORY_TRACT | Status: DC | PRN

## 2011-09-25 NOTE — Progress Notes (Signed)
  Subjective:    Patient ID: Michelle Long, female    DOB: 11/05/1945, 65 y.o.   MRN: [id number]  HPI Easy bruising- reports hx of similar.  Has multiple unexplained bruising on arms, none on legs, back, and abdomen.  Itching- lower legs bilaterally, grandchild has flea bites.  Previous whole body itching is attributed to Lipitor.  Not currently taking.  Leg cramps- have improved since restart magnesium  GERD- on protonix, reports sxs are worse than usual.  Asthma- reports she's out of albuterol for her nebulizer.  Bipolar- stopped Lamictal, Dr Betti Cruz is unaware.  Will see him on Friday.  Is very animated today- pressured speech, hyperactive.   Review of Systems For ROS see HPI     Objective:   Physical Exam  Vitals reviewed. Constitutional: She is oriented to person, place, and time. She appears well-developed and well-nourished. No distress.       Very animated- pressured speech, hyperactivity  HENT:  Head: Normocephalic and atraumatic.  Eyes: Conjunctivae and EOM are normal. Pupils are equal, round, and reactive to light.  Neck: Normal range of motion. Neck supple. No thyromegaly present.  Cardiovascular: Normal rate, regular rhythm, normal heart sounds and intact distal pulses.   Pulmonary/Chest: Effort normal and breath sounds normal. No respiratory distress. She has no wheezes. She has no rales.  Abdominal: Soft. She exhibits no distension. There is no tenderness.  Musculoskeletal: She exhibits no edema.  Lymphadenopathy:    She has no cervical adenopathy.  Neurological: She is alert and oriented to person, place, and time.  Skin: Skin is warm and dry.       Multiple flea bites of lower legs bilaterally Scattered bruises on legs and arms bilaterally- none seen on back, trunk, buttocks  Psychiatric: She has a normal mood and affect. Her behavior is normal.          Assessment & Plan:

## 2011-09-25 NOTE — Patient Instructions (Signed)
We'll notify you of your lab results Your bruising is likely due to increased activity but the labs will give [place] more info Start the Crestor 10mg  samples- watch for itching Continue the magnesium Call with any questions or concerns Happy Holidays!

## 2011-09-25 NOTE — Telephone Encounter (Signed)
Agree w/ evaluation- will need f/u w/ me to discuss plan going forward

## 2011-09-26 ENCOUNTER — Ambulatory Visit: Payer: Medicare Other | Admitting: Family Medicine

## 2011-09-26 LAB — CBC WITH DIFFERENTIAL/PLATELET
Basophils Absolute: 0 10*3/uL (ref 0.0–0.1)
Basophils Relative: 0.1 % (ref 0.0–3.0)
Eosinophils Absolute: 0.2 10*3/uL (ref 0.0–0.7)
Eosinophils Relative: 2.8 % (ref 0.0–5.0)
HCT: 42.1 % (ref 36.0–46.0)
Hemoglobin: 14.2 g/dL (ref 12.0–15.0)
Lymphocytes Relative: 29.4 % (ref 12.0–46.0)
Lymphs Abs: 1.6 10*3/uL (ref 0.7–4.0)
MCHC: 33.6 g/dL (ref 30.0–36.0)
MCV: 96.3 fl (ref 78.0–100.0)
Monocytes Absolute: 0.5 10*3/uL (ref 0.1–1.0)
Monocytes Relative: 8.5 % (ref 3.0–12.0)
Neutro Abs: 3.2 10*3/uL (ref 1.4–7.7)
Neutrophils Relative %: 59.2 % (ref 43.0–77.0)
Platelets: 135 10*3/uL — ABNORMAL LOW (ref 150.0–400.0)
RBC: 4.38 Mil/uL (ref 3.87–5.11)
RDW: 13.5 % (ref 11.5–14.6)
WBC: 5.5 10*3/uL (ref 4.5–10.5)

## 2011-09-26 LAB — HEPATIC FUNCTION PANEL
ALT: 52 U/L — ABNORMAL HIGH (ref 0–35)
AST: 46 U/L — ABNORMAL HIGH (ref 0–37)
Albumin: 3.9 g/dL (ref 3.5–5.2)
Alkaline Phosphatase: 55 U/L (ref 39–117)
Bilirubin, Direct: 0.1 mg/dL (ref 0.0–0.3)
Total Bilirubin: 0.3 mg/dL (ref 0.3–1.2)
Total Protein: 6.4 g/dL (ref 6.0–8.3)

## 2011-09-26 LAB — BASIC METABOLIC PANEL
BUN: 8 mg/dL (ref 6–23)
CO2: 27 mEq/L (ref 19–32)
Calcium: 8.8 mg/dL (ref 8.4–10.5)
Chloride: 105 mEq/L (ref 96–112)
Creatinine, Ser: 0.8 mg/dL (ref 0.4–1.2)
GFR: 74.25 mL/min (ref >60.00)
Glucose, Bld: 168 mg/dL — ABNORMAL HIGH (ref 70–99)
Potassium: 4.4 mEq/L (ref 3.5–5.1)
Sodium: 139 mEq/L (ref 135–145)

## 2011-10-06 NOTE — Assessment & Plan Note (Signed)
Pt has stopped Lipitor due to itching.  Given her psychological state today will not start new medication but will follow this at next lab draw.

## 2011-10-06 NOTE — Assessment & Plan Note (Signed)
Lungs currently clear.  Refill provided for nebulizer.

## 2011-10-06 NOTE — Assessment & Plan Note (Signed)
None of pt's current bruises are concerning.  Most likely they are results of unremembered bumps of arms or legs.  Will check labs to ensure no underlying problems.  Reassurance provided.

## 2011-10-06 NOTE — Assessment & Plan Note (Signed)
Pt has decided to stop her mood stabilizer.  This is very apparent today in her pressured speech, hyperactivity.  Has appt upcoming w/ psych.  Will defer med changes or management to him.

## 2011-10-06 NOTE — Assessment & Plan Note (Signed)
Pt reports these have improved since stopping Lipitor and restarting magnesium.

## 2011-10-14 ENCOUNTER — Telehealth: Payer: Self-pay | Admitting: Hematology & Oncology

## 2011-10-14 NOTE — Telephone Encounter (Signed)
Pt called and resch missed apt for 08/20/11 to 11/21/11

## 2011-10-16 ENCOUNTER — Telehealth: Payer: Self-pay | Admitting: *Deleted

## 2011-10-16 NOTE — Telephone Encounter (Signed)
Pt called to advise that she has a sore throat,fatigue, and congestion since Monday-worried this is the flu, delegated instructions verbally from MD Beverely Low, this is more than likely a viral illness, she can take OTC cold medication, alternate tylenol and ibuprofen if not better in 7-10 days she needs to come in for an office visit, pt understood, scheduled pt for a follow up visit on 10-18-11 at 9:45am to check her A1C and cholesterol advised to be fasting, pt understood and will come in office for appt

## 2011-10-18 ENCOUNTER — Ambulatory Visit (INDEPENDENT_AMBULATORY_CARE_PROVIDER_SITE_OTHER): Payer: Medicare Other | Admitting: Family Medicine

## 2011-10-18 ENCOUNTER — Encounter: Payer: Self-pay | Admitting: Family Medicine

## 2011-10-18 DIAGNOSIS — E119 Type 2 diabetes mellitus without complications: Secondary | ICD-10-CM

## 2011-10-18 DIAGNOSIS — E785 Hyperlipidemia, unspecified: Secondary | ICD-10-CM

## 2011-10-18 LAB — HEPATIC FUNCTION PANEL
ALT: 48 U/L — ABNORMAL HIGH (ref 0–35)
AST: 36 U/L (ref 0–37)
Albumin: 4.1 g/dL (ref 3.5–5.2)
Alkaline Phosphatase: 52 U/L (ref 39–117)
Bilirubin, Direct: 0.1 mg/dL (ref 0.0–0.3)
Total Bilirubin: 0.6 mg/dL (ref 0.3–1.2)
Total Protein: 6.9 g/dL (ref 6.0–8.3)

## 2011-10-18 LAB — HEMOGLOBIN A1C: Hgb A1c MFr Bld: 6.8 % — ABNORMAL HIGH (ref 4.6–6.5)

## 2011-10-18 LAB — LIPID PANEL
Cholesterol: 188 mg/dL (ref 0–200)
HDL: 47.2 mg/dL (ref >39.00)
LDL Cholesterol: 119 mg/dL — ABNORMAL HIGH (ref 0–99)
Total CHOL/HDL Ratio: 4
Triglycerides: 111 mg/dL (ref 0.0–149.0)
VLDL: 22.2 mg/dL (ref 0.0–40.0)

## 2011-10-18 LAB — BASIC METABOLIC PANEL
BUN: 9 mg/dL (ref 6–23)
CO2: 29 mEq/L (ref 19–32)
Calcium: 9.4 mg/dL (ref 8.4–10.5)
Chloride: 108 mEq/L (ref 96–112)
Creatinine, Ser: 0.8 mg/dL (ref 0.4–1.2)
GFR: 76.39 mL/min (ref >60.00)
Glucose, Bld: 145 mg/dL — ABNORMAL HIGH (ref 70–99)
Potassium: 4.8 mEq/L (ref 3.5–5.1)
Sodium: 145 mEq/L (ref 135–145)

## 2011-10-18 NOTE — Assessment & Plan Note (Signed)
Chronic problem.  Has never been on meds.  Attempting to manage w/ diet and exercise.  Check labs.  Start meds prn.  Pt expressed understanding and is in agreement w/ plan.

## 2011-10-18 NOTE — Patient Instructions (Signed)
Schedule your complete physical for June We'll notify you of your lab results and make any changes if needed Keep up the good work!  You look great! Call with any questions or concerns Happy New Year!!!

## 2011-10-18 NOTE — Assessment & Plan Note (Signed)
Chronic problem.  Was previously on pravastatin which was not adequate in reducing cholesterol.  Did not tolerate switch to Lipitor.  Not currently on meds.  Check labs.  Determine which med would be appropriate to start based on levels.  Pt expressed understanding and is in agreement w/ plan.

## 2011-10-18 NOTE — Progress Notes (Signed)
  Subjective:    Patient ID: Michelle Long, female    DOB: [birth year], 65 y.o.   MRN: [id number]  HPI Hyperlipidemia- chronic problem, previously on Pravastatin.  Was switched to Lipitor but had itching from this.  Not currently on meds.  Due for labs.  DM- chronic problem, reports this was due to psych meds.  Has never been on meds.  Has previously controlled w/ diet and exercise.  No CP, SOB, HAs, visual changes, edema   Review of Systems For ROS see HPI     Objective:   Physical Exam  Vitals reviewed. Constitutional: She is oriented to person, place, and time. She appears well-developed and well-nourished. No distress.  HENT:  Head: Normocephalic and atraumatic.  Eyes: Conjunctivae and EOM are normal. Pupils are equal, round, and reactive to light.  Neck: Normal range of motion. Neck supple. No thyromegaly present.  Cardiovascular: Normal rate, regular rhythm, normal heart sounds and intact distal pulses.   No murmur heard. Pulmonary/Chest: Effort normal and breath sounds normal. No respiratory distress.  Abdominal: Soft. She exhibits no distension. There is no tenderness.  Musculoskeletal: She exhibits no edema.  Lymphadenopathy:    She has no cervical adenopathy.  Neurological: She is alert and oriented to person, place, and time.  Skin: Skin is warm and dry.  Psychiatric: She has a normal mood and affect. Her behavior is normal.          Assessment & Plan:

## 2011-10-25 ENCOUNTER — Telehealth: Payer: Self-pay | Admitting: Family Medicine

## 2011-10-25 NOTE — Telephone Encounter (Signed)
Please contact patient

## 2011-10-25 NOTE — Telephone Encounter (Signed)
Spoke to pt to advise results/instructions. Pt understood.  

## 2011-11-21 ENCOUNTER — Ambulatory Visit (HOSPITAL_BASED_OUTPATIENT_CLINIC_OR_DEPARTMENT_OTHER): Payer: Medicare Other | Admitting: Hematology & Oncology

## 2011-11-21 ENCOUNTER — Other Ambulatory Visit (HOSPITAL_BASED_OUTPATIENT_CLINIC_OR_DEPARTMENT_OTHER): Payer: Medicare Other | Admitting: Lab

## 2011-11-21 VITALS — BP 135/81 | HR 98 | Temp 97.1°F | Ht 64.0 in | Wt 163.0 lb

## 2011-11-21 DIAGNOSIS — C50919 Malignant neoplasm of unspecified site of unspecified female breast: Secondary | ICD-10-CM

## 2011-11-21 DIAGNOSIS — C50319 Malignant neoplasm of lower-inner quadrant of unspecified female breast: Secondary | ICD-10-CM

## 2011-11-21 LAB — COMPREHENSIVE METABOLIC PANEL WITH GFR
ALT: 32 U/L (ref 0–35)
AST: 26 U/L (ref 0–37)
Albumin: 4.1 g/dL (ref 3.5–5.2)
Alkaline Phosphatase: 55 U/L (ref 39–117)
BUN: 10 mg/dL (ref 6–23)
CO2: 24 meq/L (ref 19–32)
Calcium: 9.1 mg/dL (ref 8.4–10.5)
Chloride: 108 meq/L (ref 96–112)
Creatinine, Ser: 0.73 mg/dL (ref 0.50–1.10)
Glucose, Bld: 130 mg/dL — ABNORMAL HIGH (ref 70–99)
Potassium: 4.1 meq/L (ref 3.5–5.3)
Sodium: 142 meq/L (ref 135–145)
Total Bilirubin: 0.3 mg/dL (ref 0.3–1.2)
Total Protein: 6.2 g/dL (ref 6.0–8.3)

## 2011-11-21 LAB — CBC WITH DIFFERENTIAL (CANCER CENTER ONLY)
BASO#: 0 10*3/uL (ref 0.0–0.2)
BASO%: 0.4 % (ref 0.0–2.0)
EOS%: 3.2 % (ref 0.0–7.0)
Eosinophils Absolute: 0.2 10*3/uL (ref 0.0–0.5)
HCT: 42.5 % (ref 34.8–46.6)
HGB: 14.3 g/dL (ref 11.6–15.9)
LYMPH#: 1.5 10*3/uL (ref 0.9–3.3)
LYMPH%: 26.8 % (ref 14.0–48.0)
MCH: 31.5 pg (ref 26.0–34.0)
MCHC: 33.6 g/dL (ref 32.0–36.0)
MCV: 94 fL (ref 81–101)
MONO#: 0.4 10*3/uL (ref 0.1–0.9)
MONO%: 7.3 % (ref 0.0–13.0)
NEUT#: 3.5 10*3/uL (ref 1.5–6.5)
NEUT%: 62.3 % (ref 39.6–80.0)
Platelets: 160 10*3/uL (ref 145–400)
RBC: 4.54 10*6/uL (ref 3.70–5.32)
RDW: 12.1 % (ref 11.1–15.7)
WBC: 5.6 10*3/uL (ref 3.9–10.0)

## 2011-11-21 LAB — LACTATE DEHYDROGENASE: LDH: 184 U/L (ref 94–250)

## 2011-11-21 MED ORDER — TAMOXIFEN CITRATE 20 MG PO TABS: 20.0000 mg | ORAL_TABLET | Freq: Every day | ORAL | Status: DC

## 2011-11-21 NOTE — Progress Notes (Signed)
Addended by: Arlan Organ R on: 11/21/2011 05:22 PM   Modules accepted: Orders

## 2011-11-21 NOTE — Progress Notes (Signed)
This office note has been dictated.

## 2011-11-22 NOTE — Progress Notes (Signed)
DIAGNOSIS:  Stage IIA (T2 N0 M0) ductal carcinoma of the left breast.  CURRENT THERAPY:  Observation.  INTERIM HISTORY:  [name] comes in for follow-up.  She was seen back in August 2012.  She is doing okay.  She got through the holidays without any problems. She has had no nausea or vomiting.  There has been no bony pain.  She has had no fatigue or weakness.  There has been no leg swelling.  There have been no rashes.  She I think stopped her tamoxifen back in I think December.  She had been on it for about 5-1/2 years.  PHYSICAL EXAMINATION:  General Appearance:  This is a well-developed, well-nourished white female in no obvious distress.  Vital Signs:  Show a temperature of 97.1, pulse 98, respiratory rate 16, blood pressure 135/81.  Weight is 163.  Head and Neck Exam:  Shows a normocephalic, atraumatic skull.  There are no ocular or oral lesions.  There are no palpable cervical or supraclavicular lymph nodes.  Lungs:  Clear bilaterally.  She has no rales, wheezes or rhonchi.  Cardiac Exam: Regular rate and rhythm with a normal S1 and S2.  There are no murmurs, rubs or bruits.  Abdominal Exam:  Soft with good bowel sounds.  There is no palpable abdominal mass.  There is no palpable hepatosplenomegaly. Breast Exam: Shows right breast with no masses, edema or erythema. There is no right axillary adenopathy.  Left breast shows a well-healed lumpectomy at the I think 8 o'clock position.  There is some slight contraction of the left breast from radiation.  There is some firmness at the lumpectomy site.  There is no distinct mass in the left breast. There is no left axillary adenopathy.  Back Exam:  No tenderness over the spine, ribs or hips.  Extremities:  Show no clubbing, cyanosis or edema.  No lymphedema is noted in the left arm.  Neurological Exam: Shows no focal neurological deficits.  LABORATORY STUDIES:  White cell count is 5.6, hemoglobin 14.3, hematocrit 42.5, platelet  count 160.  IMPRESSION:  [name] is a 66 year old white female with stage IIA (T2 N0 M0) ductal carcinoma of the left breast.  She did receive adjuvant chemotherapy with four cycles of Adriamycin/Cytoxan followed by Herceptin.  She could not complete a year of Herceptin because of declining cardiac function.  I do not see any evidence of congestive heart failure right now.  I think the issue that we are going to have though is that she is off tamoxifen now.  At the recent [facility] breast meeting, it was found that 10 years of tamoxifen clearly is better than 5 years.  As such, I think that we are going to have to get [name] back onto tamoxifen.  I am going to have to talk to her about this.  I do believe tamoxifen is useful and helpful for her.  I also would put her on aspirin.  I think two baby aspirin a day would be helpful for her.  I do want to get her back in 6 months for follow-up.  I do not see that we need to do any x-rays or additional lab work in between visits.    ______________________________ Josph Macho, M.D. PRE/MEDQ  D:  11/21/2011  T:  11/22/2011  Job:  [id number]

## 2011-12-18 ENCOUNTER — Other Ambulatory Visit: Payer: Self-pay

## 2011-12-18 MED ORDER — ROSUVASTATIN CALCIUM 10 MG PO TABS: 10.0000 mg | ORAL_TABLET | Freq: Every day | ORAL | Status: DC

## 2011-12-18 NOTE — Telephone Encounter (Signed)
Patient called requesting a script for the Crestor to be called into her pharmacy.  She states that she was given samples previously.  Prescription sent to her pharmacy.

## 2011-12-19 ENCOUNTER — Telehealth: Payer: Self-pay | Admitting: Family Medicine

## 2011-12-19 MED ORDER — BUDESONIDE-FORMOTEROL FUMARATE 160-4.5 MCG/ACT IN AERO: 2.0000 | INHALATION_SPRAY | Freq: Two times a day (BID) | RESPIRATORY_TRACT | Status: DC

## 2011-12-19 NOTE — Telephone Encounter (Signed)
rx sent to pharmacy by e-script  

## 2011-12-19 NOTE — Telephone Encounter (Signed)
Refill: Symbicort 160-4.33mcg AER. Inhale 2 puffs into the lungs 2 times daily.  Qty 10.2. Last fill 12-19-11

## 2011-12-25 ENCOUNTER — Telehealth: Payer: Self-pay | Admitting: *Deleted

## 2011-12-25 NOTE — Telephone Encounter (Signed)
Will await derm report

## 2011-12-25 NOTE — Telephone Encounter (Signed)
[facility] Triage Call Report Triage Record Num: [id number] Operator: Richardean Chimera Patient Name: Michelle Long Call Date & Time: 12/25/2011 1:57:00PM Patient Phone: [phone] PCP: Lezlie Octave Patient Gender: Female PCP Fax : [phone] Patient DOB: 22-Dec-1945 Practice Name: [facility] Reason for Call: [name]: Michelle Long/Patient; PCP: Sheliah Hatch.; CB#: [phone]; Call regarding pin point, bright red, non-itchy rash on both shins. Onset: since June, 2012. All emergent sxs per Rash protocol r/o with exception to 'Rash lasting more than 2 weeks'. Pt. says she has a dermatologist and will go for a full body scan to have evaluated. RN instructed her to have Dermatologist send report for our records as well. Protocol(s) Used: Rash Recommended Outcome per Protocol: See Provider within 72 Hours Reason for Outcome: Rash lasting more than 2 weeks Care Advice: ~ Do not scratch or rub lesions to decrease the chance of secondary infection. ~ Wear loose-fitting, non-restricting clothing. Avoid use of perfumed or strong soaps, detergents or any product that is irritating to the skin. Only use mild, unscented soap (Aveeno, Neutrogena) for bathing to decrease irritation. ~ ~ SYMPTOM / CONDITION MANAGEMENT

## 2012-01-22 ENCOUNTER — Encounter: Payer: Self-pay | Admitting: Family Medicine

## 2012-02-04 ENCOUNTER — Ambulatory Visit (INDEPENDENT_AMBULATORY_CARE_PROVIDER_SITE_OTHER): Payer: Medicare Other | Admitting: Internal Medicine

## 2012-02-04 ENCOUNTER — Encounter: Payer: Self-pay | Admitting: Internal Medicine

## 2012-02-04 VITALS — BP 140/86 | HR 109 | Temp 97.9°F | Wt 166.0 lb

## 2012-02-04 DIAGNOSIS — D696 Thrombocytopenia, unspecified: Secondary | ICD-10-CM

## 2012-02-04 DIAGNOSIS — R5381 Other malaise: Secondary | ICD-10-CM

## 2012-02-04 DIAGNOSIS — R5383 Other fatigue: Secondary | ICD-10-CM

## 2012-02-04 DIAGNOSIS — N390 Urinary tract infection, site not specified: Secondary | ICD-10-CM

## 2012-02-04 DIAGNOSIS — E119 Type 2 diabetes mellitus without complications: Secondary | ICD-10-CM

## 2012-02-04 LAB — POCT URINALYSIS DIPSTICK
Glucose, UA: NEGATIVE
Ketones, UA: NEGATIVE
Leukocytes, UA: NEGATIVE
Nitrite, UA: NEGATIVE
Spec Grav, UA: 1.02
Urobilinogen, UA: 0.2
pH, UA: 6

## 2012-02-04 MED ORDER — NITROFURANTOIN MONOHYD MACRO 100 MG PO CAPS: 100.0000 mg | ORAL_CAPSULE | Freq: Two times a day (BID) | ORAL | Status: AC

## 2012-02-04 NOTE — Progress Notes (Addendum)
  Subjective:    Patient ID: Michelle Long, female    DOB: 08-Apr-1946, 66 y.o.   MRN: [id number]  HPI Acute visit The patient is here with several symptoms: One month  history of fatigue and tiredness, hard for her to describe it any further. See review of systems. Also, her diabetes is not as well-controlled as before. Previously , her ambulatory blood sugars fasting where ~100, lately they are ~ 163-170. Nonfasting blood sugars are 200 Last night she felt achy and had a very dry mouth but she feels better today.  Past Medical History  Diagnosis Date  . Asthma   . Breast cancer   . Bladder cancer   . Depression   . Bipolar disorder   . Diabetes mellitus   . GERD (gastroesophageal reflux disease)   . Hepatitis B infection   . Hyperlipidemia   . History of transfusion of whole blood     Approximately 2009, due to breast cancer/chemo.   Past Surgical History  Procedure Date  . Tonsillectomy 1951  . Appendectomy 1961  . Partial hysterectomy 1991  . Bladder cancer 2006  . Breast lumpectomy 2007  . Abdominal hysterectomy      Review of Systems No chest pain or shortness breath. No lower extremity edema or dyspnea on exertion. No nausea, vomiting, diarrhea. Her psych medicines have been changed, the newest medication she's taking the Zyprexa for the last 3 months. She did have polyuria last night but otherwise denies it. No polydipsia. No blurred vision. No dysuria, gross hematuria. She is a slightly tachycardic, but denies any recent airplane  trips or lower extremity pain or edema.    Objective:   Physical Exam General -- alert, well-developed, and well-nourished. NAD  Neck --no LADs, no thyromegaly Lungs -- normal respiratory effort, no intercostal retractions, no accessory muscle use, and normal breath sounds.   Heart-- slightly tachycardic, regular rhythm, no murmur, and no gallop.   Abdomen--soft, non-tender, no distention, no masses, no HSM, no guarding, and no  rigidity.   Extremities-- no pretibial edema bilaterally , no calf tenderness       Assessment & Plan:    Presents today with the following problems: Fatigue, may be due to: -undercontrolled diabetes -recent change on psych medicines -other etiology. Diabetes with recently increased CBGs, may be related to UTI Urinalysis consistent with a UTI, culture pending. Also wonders if Crestor is increasing her CBGs. Plan: Start empiric treatment with Bactrim, UCX pending Labs Followup with PCP in 10 days As far as Crestor, I recommend her to continue with it for now.

## 2012-02-04 NOTE — Patient Instructions (Signed)
Drink plenty of fluids, call if you get  worse. Please see your  PCP next week.

## 2012-02-05 LAB — CBC WITH DIFFERENTIAL/PLATELET
Basophils Absolute: 0 10*3/uL (ref 0.0–0.1)
Basophils Relative: 0.6 % (ref 0.0–3.0)
Eosinophils Absolute: 0.1 10*3/uL (ref 0.0–0.7)
Eosinophils Relative: 1.2 % (ref 0.0–5.0)
HCT: 43.5 % (ref 36.0–46.0)
Hemoglobin: 14.2 g/dL (ref 12.0–15.0)
Lymphocytes Relative: 33.2 % (ref 12.0–46.0)
Lymphs Abs: 1.9 10*3/uL (ref 0.7–4.0)
MCHC: 32.7 g/dL (ref 30.0–36.0)
MCV: 95.3 fl (ref 78.0–100.0)
Monocytes Absolute: 0.8 10*3/uL (ref 0.1–1.0)
Monocytes Relative: 14 % — ABNORMAL HIGH (ref 3.0–12.0)
Neutro Abs: 3 10*3/uL (ref 1.4–7.7)
Neutrophils Relative %: 51 % (ref 43.0–77.0)
Platelets: 90 10*3/uL — ABNORMAL LOW (ref 150.0–400.0)
RBC: 4.56 Mil/uL (ref 3.87–5.11)
RDW: 13.3 % (ref 11.5–14.6)
WBC: 5.8 10*3/uL (ref 4.5–10.5)

## 2012-02-05 LAB — BASIC METABOLIC PANEL
BUN: 13 mg/dL (ref 6–23)
CO2: 24 mEq/L (ref 19–32)
Calcium: 9 mg/dL (ref 8.4–10.5)
Chloride: 106 mEq/L (ref 96–112)
Creatinine, Ser: 0.6 mg/dL (ref 0.4–1.2)
GFR: 98.73 mL/min (ref >60.00)
Glucose, Bld: 143 mg/dL — ABNORMAL HIGH (ref 70–99)
Potassium: 4 mEq/L (ref 3.5–5.1)
Sodium: 139 mEq/L (ref 135–145)

## 2012-02-05 LAB — HEMOGLOBIN A1C: Hgb A1c MFr Bld: 7.7 % — ABNORMAL HIGH (ref 4.6–6.5)

## 2012-02-06 LAB — URINE CULTURE: Colony Count: 4000

## 2012-02-07 NOTE — Progress Notes (Signed)
Addended by: Edwena Felty T on: 02/07/2012 12:13 PM   Modules accepted: Orders

## 2012-02-11 ENCOUNTER — Other Ambulatory Visit (INDEPENDENT_AMBULATORY_CARE_PROVIDER_SITE_OTHER): Payer: Medicare Other

## 2012-02-11 DIAGNOSIS — D696 Thrombocytopenia, unspecified: Secondary | ICD-10-CM

## 2012-02-11 LAB — CBC WITH DIFFERENTIAL/PLATELET
Basophils Absolute: 0 10*3/uL (ref 0.0–0.1)
Basophils Relative: 0.4 % (ref 0.0–3.0)
Eosinophils Absolute: 0.2 10*3/uL (ref 0.0–0.7)
Eosinophils Relative: 3 % (ref 0.0–5.0)
HCT: 40.6 % (ref 36.0–46.0)
Hemoglobin: 13.4 g/dL (ref 12.0–15.0)
Lymphocytes Relative: 25.8 % (ref 12.0–46.0)
Lymphs Abs: 1.4 10*3/uL (ref 0.7–4.0)
MCHC: 33 g/dL (ref 30.0–36.0)
MCV: 95.1 fl (ref 78.0–100.0)
Monocytes Absolute: 0.4 10*3/uL (ref 0.1–1.0)
Monocytes Relative: 6.7 % (ref 3.0–12.0)
Neutro Abs: 3.5 10*3/uL (ref 1.4–7.7)
Neutrophils Relative %: 64.1 % (ref 43.0–77.0)
Platelets: 147 10*3/uL — ABNORMAL LOW (ref 150.0–400.0)
RBC: 4.27 Mil/uL (ref 3.87–5.11)
RDW: 13.3 % (ref 11.5–14.6)
WBC: 5.4 10*3/uL (ref 4.5–10.5)

## 2012-02-11 NOTE — Progress Notes (Signed)
LABS ONLY  

## 2012-02-13 ENCOUNTER — Encounter: Payer: Self-pay | Admitting: Family Medicine

## 2012-02-13 ENCOUNTER — Ambulatory Visit (INDEPENDENT_AMBULATORY_CARE_PROVIDER_SITE_OTHER): Payer: Medicare Other | Admitting: Family Medicine

## 2012-02-13 ENCOUNTER — Encounter: Payer: Self-pay | Admitting: *Deleted

## 2012-02-13 VITALS — BP 118/77 | HR 97 | Temp 97.7°F | Ht 63.75 in | Wt 169.0 lb

## 2012-02-13 DIAGNOSIS — E119 Type 2 diabetes mellitus without complications: Secondary | ICD-10-CM

## 2012-02-13 DIAGNOSIS — R5383 Other fatigue: Secondary | ICD-10-CM

## 2012-02-13 DIAGNOSIS — R5381 Other malaise: Secondary | ICD-10-CM

## 2012-02-13 LAB — TSH: TSH: 0.68 u[IU]/mL (ref 0.35–5.50)

## 2012-02-13 MED ORDER — SITAGLIPTIN PHOSPHATE 100 MG PO TABS: 100.0000 mg | ORAL_TABLET | Freq: Every day | ORAL | Status: DC

## 2012-02-13 NOTE — Patient Instructions (Signed)
Follow up in 1 month Start the Januvia- 1 tab daily Don't take the Crestor until our next visit We'll notify you of your lab results Hang in there!

## 2012-02-13 NOTE — Progress Notes (Signed)
  Subjective:    Patient ID: Michelle Long, female    DOB: 1946/03/17, 66 y.o.   MRN: [id number]  HPI Fatigue- sxs started 'several weeks ago'.  Had CBC 2 days ago showing no anemia.  Reports sleeping well.  Reports CBGs are running higher than usual- wonders if this can contribute.  Has never been on meds for DM.  Has not had thyroid checked recently.  Has called psych to discuss bipolar meds- zyprexa was recently started.   Review of Systems For ROS see HPI     Objective:   Physical Exam  Vitals reviewed. Constitutional: She is oriented to person, place, and time. She appears well-developed and well-nourished. No distress.  HENT:  Head: Normocephalic and atraumatic.  Eyes: Conjunctivae and EOM are normal. Pupils are equal, round, and reactive to light.  Neck: Normal range of motion. Neck supple. No thyromegaly present.  Cardiovascular: Normal rate, regular rhythm, normal heart sounds and intact distal pulses.   No murmur heard. Pulmonary/Chest: Effort normal and breath sounds normal. No respiratory distress.  Abdominal: Soft. She exhibits no distension. There is no tenderness.  Musculoskeletal: She exhibits no edema.  Lymphadenopathy:    She has no cervical adenopathy.  Neurological: She is alert and oriented to person, place, and time.  Skin: Skin is warm and dry.  Psychiatric: She has a normal mood and affect. Her behavior is normal.          Assessment & Plan:

## 2012-02-14 NOTE — Assessment & Plan Note (Signed)
New.  Likely multifactorial- poor glycemic control, sedating psych meds, ? Viral illness (given transient low platelet count as this is an acute phase reactant).  Check TSH as this was not done previous.

## 2012-02-14 NOTE — Assessment & Plan Note (Signed)
Deteriorated.  Pt's recent A1C shows a 1% jump from previous.  Likely due in part to her antipsychotic use.  Add Januvia daily for better glycemic control.  This may also improve pt's sxs of fatigue.  Will follow closely.

## 2012-02-17 ENCOUNTER — Encounter: Payer: Self-pay | Admitting: *Deleted

## 2012-03-13 ENCOUNTER — Ambulatory Visit: Payer: Medicare Other | Admitting: Family Medicine

## 2012-03-19 ENCOUNTER — Ambulatory Visit (INDEPENDENT_AMBULATORY_CARE_PROVIDER_SITE_OTHER): Payer: Medicare Other | Admitting: Family Medicine

## 2012-03-19 ENCOUNTER — Encounter: Payer: Self-pay | Admitting: Family Medicine

## 2012-03-19 VITALS — BP 136/74 | HR 96 | Temp 97.7°F | Wt 161.0 lb

## 2012-03-19 DIAGNOSIS — R5383 Other fatigue: Secondary | ICD-10-CM

## 2012-03-19 DIAGNOSIS — E119 Type 2 diabetes mellitus without complications: Secondary | ICD-10-CM

## 2012-03-19 DIAGNOSIS — R5381 Other malaise: Secondary | ICD-10-CM

## 2012-03-19 DIAGNOSIS — K219 Gastro-esophageal reflux disease without esophagitis: Secondary | ICD-10-CM

## 2012-03-19 NOTE — Patient Instructions (Signed)
Follow up as scheduled in June Continue the Januvia daily Keep up the good work!  You look great! Try and eat earlier in the evening to avoid lying down after eating- this will improve the reflux If you again have severe symptoms, double the Protonix to twice daily for 3-5 days Call with any questions or concerns Happy Early [place] Ouch!

## 2012-03-19 NOTE — Progress Notes (Signed)
  Subjective:    Patient ID: Michelle Long, female    DOB: 1946-01-26, 66 y.o.   MRN: [id number]  HPI Fatigue- CBGs have leveled out, now running 100-110.  Has lost weight since last visit.  Leg cramps and itching has resolved since stopping Lamictal and Crestor.  Reports sxs of fatigue have improved.   GERD- pt has hx of severe sxs, was taking Protonix BID but had decreased to daily.  Recently had severe flare.  Wants to know about changing meds.  Admits to poor lifestyle choices- wrong foods, eating late.  Has GI in [place].    Review of Systems For ROS see HPI     Objective:   Physical Exam  Vitals reviewed. Constitutional: She is oriented to person, place, and time. She appears well-developed and well-nourished. No distress.  HENT:  Head: Normocephalic and atraumatic.  Eyes: Conjunctivae and EOM are normal. Pupils are equal, round, and reactive to light.  Neck: Normal range of motion. Neck supple. No thyromegaly present.  Cardiovascular: Normal rate, regular rhythm, normal heart sounds and intact distal pulses.   No murmur heard. Pulmonary/Chest: Effort normal and breath sounds normal. No respiratory distress.  Abdominal: Soft. She exhibits no distension. There is no tenderness.  Musculoskeletal: She exhibits no edema.  Lymphadenopathy:    She has no cervical adenopathy.  Neurological: She is alert and oriented to person, place, and time.  Skin: Skin is warm and dry.  Psychiatric: She has a normal mood and affect. Her behavior is normal.          Assessment & Plan:

## 2012-03-22 NOTE — Assessment & Plan Note (Signed)
Improved CBGs since starting Januvia at last visit.  Continue current meds.  Follow up as scheduled in June.

## 2012-03-22 NOTE — Assessment & Plan Note (Signed)
Improved.  Pt stopped Crestor and had psych meds switched.  Also feels that improved CBGs are making a difference since starting Januvia.  Will continue to follow.  No changes at this time.

## 2012-03-22 NOTE — Assessment & Plan Note (Signed)
Deteriorated.  Pt w/ recent flare.  Discussed lifestyle modifications.  Pt to double PPI short term when flares occur.  Will follow.

## 2012-04-13 ENCOUNTER — Other Ambulatory Visit: Payer: Self-pay | Admitting: Family Medicine

## 2012-04-13 DIAGNOSIS — Z9889 Other specified postprocedural states: Secondary | ICD-10-CM

## 2012-04-13 DIAGNOSIS — Z853 Personal history of malignant neoplasm of breast: Secondary | ICD-10-CM

## 2012-04-16 ENCOUNTER — Other Ambulatory Visit: Payer: Self-pay | Admitting: *Deleted

## 2012-04-16 MED ORDER — SITAGLIPTIN PHOSPHATE 100 MG PO TABS: 100.0000 mg | ORAL_TABLET | Freq: Every day | ORAL | Status: DC

## 2012-04-17 ENCOUNTER — Encounter: Payer: Medicare Other | Admitting: Family Medicine

## 2012-04-20 ENCOUNTER — Telehealth: Payer: Self-pay | Admitting: Family Medicine

## 2012-04-20 MED ORDER — PANTOPRAZOLE SODIUM 40 MG PO TBEC: 40.0000 mg | DELAYED_RELEASE_TABLET | Freq: Every day | ORAL | Status: DC

## 2012-04-20 NOTE — Telephone Encounter (Signed)
rx sent to pharmacy by e-script  

## 2012-04-20 NOTE — Telephone Encounter (Signed)
Refill: Pantoprazole sodium 40mg  ter. Take 1 tablet by mouth daily. Qty 30. Last fill 04-18-12

## 2012-05-14 ENCOUNTER — Telehealth: Payer: Self-pay | Admitting: Hematology & Oncology

## 2012-05-14 ENCOUNTER — Ambulatory Visit (HOSPITAL_BASED_OUTPATIENT_CLINIC_OR_DEPARTMENT_OTHER): Payer: Medicare Other | Admitting: Medical

## 2012-05-14 ENCOUNTER — Other Ambulatory Visit (HOSPITAL_BASED_OUTPATIENT_CLINIC_OR_DEPARTMENT_OTHER): Payer: Medicare Other | Admitting: Lab

## 2012-05-14 VITALS — BP 135/78 | HR 97 | Temp 97.5°F | Ht 63.0 in | Wt 166.0 lb

## 2012-05-14 DIAGNOSIS — C50919 Malignant neoplasm of unspecified site of unspecified female breast: Secondary | ICD-10-CM

## 2012-05-14 DIAGNOSIS — C50319 Malignant neoplasm of lower-inner quadrant of unspecified female breast: Secondary | ICD-10-CM

## 2012-05-14 LAB — CBC WITH DIFFERENTIAL (CANCER CENTER ONLY)
BASO#: 0 10*3/uL (ref 0.0–0.2)
BASO%: 0.6 % (ref 0.0–2.0)
EOS%: 3.3 % (ref 0.0–7.0)
Eosinophils Absolute: 0.2 10*3/uL (ref 0.0–0.5)
HCT: 42.6 % (ref 34.8–46.6)
HGB: 14.5 g/dL (ref 11.6–15.9)
LYMPH#: 1.6 10*3/uL (ref 0.9–3.3)
LYMPH%: 30.7 % (ref 14.0–48.0)
MCH: 31.7 pg (ref 26.0–34.0)
MCHC: 34 g/dL (ref 32.0–36.0)
MCV: 93 fL (ref 81–101)
MONO#: 0.5 10*3/uL (ref 0.1–0.9)
MONO%: 10 % (ref 0.0–13.0)
NEUT#: 2.8 10*3/uL (ref 1.5–6.5)
NEUT%: 55.4 % (ref 39.6–80.0)
Platelets: 136 10*3/uL — ABNORMAL LOW (ref 145–400)
RBC: 4.57 10*6/uL (ref 3.70–5.32)
RDW: 12.6 % (ref 11.1–15.7)
WBC: 5.1 10*3/uL (ref 3.9–10.0)

## 2012-05-14 NOTE — Telephone Encounter (Signed)
Mailed January schedule °

## 2012-05-14 NOTE — Progress Notes (Signed)
Patient Name : Michelle Long, Ramer MR #[id number] DOB: 1945/11/27 Encounter Date: 05/14/2012 Dictated by Eunice Blase, PA-C  Diagnosis: Stage IIA (T2, N0 M0) ductal carcinoma of the left breast.  Current therapy: Tamoxifen 20 mg by mouth every day  Interim history: Michelle Long comes in today for an office followup visit.  Overall, she, reports she's been doing quite well without any new problems.  She is not reporting any type of bony pain, fatigue, weakness.  She is not reporting any rashes, or leg swelling.  She does not report any chest pain, or shortness of breath, nausea, vomiting, diarrhea, constipation, or any obvious, bleeding.  She remains on her tamoxifen. The Overall plan was that is to keep her on it for about 10 years.  She has been on it for over 6 years now.  She continues with yearly mammograms and reports her yearly is due very soon.  Michelle Long is doing remarkable well without any new Popham's or complaints.    Review of Systems: Pt. Denies any changes in their vision, hearing, adenopathy, fevers, chills, nausea, vomiting, diarrhea, constipation, chest pain, shortness of breath, passing blood, passing out, blacking out,  any changes in skin, joints, neurologic or psychiatric except as noted.  Physical Exam: This is a pleasant, 66 year old, white female, in no obvious distress Vitals: Temperature 97.5 degrees, pulse 97, respirations 20, blood pressure 135/78, weight 166 pounds HEENT reveals a normocephalic, atraumatic skull, no scleral icterus, no oral lesions  Neck is supple without any cervical or supraclavicular adenopathy.  Lungs are clear to auscultation bilaterally. There are no wheezes, rales or rhonci Cardiac is regular rate and rhythm with a normal S1 and S2. There are no murmurs, rubs, or bruits.  Abdomen is soft with good bowel sounds there's a palpable mass. There is no palpable hepatosplenomegaly. There is no palpable fluid wave.  Musculoskeletal no tenderness of the spine,  ribs, or hips.  Extremities there are no clubbing, cyanosis, or edema.  Skin no petechia, purpura or ecchymosis Neurologic is nonfocal. Bilateral Breast Exam: Right breast with no masses, edema, erythema, peau de orange, nipple discharge, or dimpling. No axillary adenopathy.  Left breast exam: well healed lumpectomy at the 8'oclock position.  Slight contraction of the left breast from radiation and scar tissue. There is no distinct mass in the left breast or axillary adenopathy.  Laboratory Data: White count 5.1, hemoglobin 14.5, hematocrit 42.6, platelets 136,000  Current Outpatient Prescriptions on File Prior to Visit  Medication Sig Dispense Refill  . albuterol (PROVENTIL) (2.5 MG/3ML) 0.083% nebulizer solution Take 3 mLs (2.5 mg total) by nebulization every 6 (six) hours as needed for wheezing.  75 mL  12  . aspirin 81 MG tablet Take 81 mg by mouth daily.      . budesonide-formoterol (SYMBICORT) 160-4.5 MCG/ACT inhaler Inhale 2 puffs into the lungs 2 (two) times daily.  1 Inhaler  5  . calcium carbonate (TUMS EX) 750 MG chewable tablet Chew 1 tablet by mouth daily.      . Cholecalciferol (VITAMIN D) 1000 UNITS capsule Take 3,000 Units by mouth daily.       . citalopram (CELEXA) 10 MG tablet Take 10 mg by mouth at bedtime. Taking 5 mg. Only.      . fish oil-omega-3 fatty acids 1000 MG capsule Take 1 g by mouth daily.        Marland Kitchen glucose blood (ONE TOUCH ULTRA TEST) test strip Use as instructed  100 each  12  . LORazepam (  ATIVAN) 0.5 MG tablet Take 0.5 mg by mouth 2 (two) times daily as needed.        . Multiple Vitamin (MULTIVITAMIN) tablet Take 1 tablet by mouth daily.        Marland Kitchen OLANZapine (ZYPREXA) 10 MG tablet Take 10 mg by mouth At bedtime.      . pantoprazole (PROTONIX) 40 MG tablet Take 1 tablet (40 mg total) by mouth daily.  30 tablet  5  . sitaGLIPtin (JANUVIA) 100 MG tablet Take 1 tablet (100 mg total) by mouth daily.  30 tablet  1  . tamoxifen (NOLVADEX) 20 MG tablet Take 1 tablet  (20 mg total) by mouth daily.  90 tablet  3  . vitamin C (ASCORBIC ACID) 500 MG tablet Take 500 mg by mouth 2 (two) times daily before a meal.        Assessment/Plan: This is a pleasant, 66 year old, female, with the following issues: #1 stage II a (T2, N0, M0) ductal carcinoma of left breast.  She did receive adjuvant chemotherapy with 4 cycles of Adriamycin/Cytoxan, followed by Herceptin.  She was unable to complete a year Perceptin because of declining cardiac function.  She will continue to remain on her tamoxifen for a total of 10 years.  She will continue with yearly mammograms.  She will also remain on 2 baby aspirin a day.   #2 followup-we will follow back up with Michelle Long in 6 months, but before then should there be questions or concerns.

## 2012-05-15 LAB — COMPREHENSIVE METABOLIC PANEL
ALT: 25 U/L (ref 0–35)
AST: 22 U/L (ref 0–37)
Albumin: 4.1 g/dL (ref 3.5–5.2)
Alkaline Phosphatase: 49 U/L (ref 39–117)
BUN: 11 mg/dL (ref 6–23)
CO2: 26 mEq/L (ref 19–32)
Calcium: 9 mg/dL (ref 8.4–10.5)
Chloride: 108 mEq/L (ref 96–112)
Creatinine, Ser: 0.73 mg/dL (ref 0.50–1.10)
Glucose, Bld: 132 mg/dL — ABNORMAL HIGH (ref 70–99)
Potassium: 4.3 mEq/L (ref 3.5–5.3)
Sodium: 143 mEq/L (ref 135–145)
Total Bilirubin: 0.4 mg/dL (ref 0.3–1.2)
Total Protein: 6.2 g/dL (ref 6.0–8.3)

## 2012-05-15 LAB — VITAMIN D 25 HYDROXY (VIT D DEFICIENCY, FRACTURES): Vit D, 25-Hydroxy: 37 ng/mL (ref 30–89)

## 2012-05-18 ENCOUNTER — Telehealth: Payer: Self-pay | Admitting: Family Medicine

## 2012-05-18 MED ORDER — SITAGLIPTIN PHOSPHATE 100 MG PO TABS: 100.0000 mg | ORAL_TABLET | Freq: Every day | ORAL | Status: DC

## 2012-05-18 NOTE — Telephone Encounter (Signed)
Refill: Januvia 100mg  tab. Take 1 tablet by mouth daily.Qty 30. Last fill 05-17-12

## 2012-05-18 NOTE — Telephone Encounter (Signed)
rx sent to pharmacy by e-script  

## 2012-05-19 ENCOUNTER — Ambulatory Visit
Admission: RE | Admit: 2012-05-19 | Discharge: 2012-05-19 | Disposition: A | Discharge status: Home / Self Care | Payer: Medicare Other | Source: Ambulatory Visit | Attending: Family Medicine | Admitting: Family Medicine

## 2012-05-19 DIAGNOSIS — Z9889 Other specified postprocedural states: Secondary | ICD-10-CM

## 2012-05-19 DIAGNOSIS — Z853 Personal history of malignant neoplasm of breast: Secondary | ICD-10-CM

## 2012-06-15 ENCOUNTER — Telehealth: Payer: Self-pay | Admitting: *Deleted

## 2012-06-15 ENCOUNTER — Ambulatory Visit (INDEPENDENT_AMBULATORY_CARE_PROVIDER_SITE_OTHER): Payer: Medicare Other | Admitting: Family Medicine

## 2012-06-15 ENCOUNTER — Encounter: Payer: Self-pay | Admitting: Family Medicine

## 2012-06-15 VITALS — BP 130/72 | HR 88 | Temp 97.6°F | Ht 63.75 in | Wt 170.8 lb

## 2012-06-15 DIAGNOSIS — Z0000 Encounter for general adult medical examination without abnormal findings: Secondary | ICD-10-CM

## 2012-06-15 DIAGNOSIS — E1165 Type 2 diabetes mellitus with hyperglycemia: Secondary | ICD-10-CM

## 2012-06-15 DIAGNOSIS — E785 Hyperlipidemia, unspecified: Secondary | ICD-10-CM

## 2012-06-15 DIAGNOSIS — E119 Type 2 diabetes mellitus without complications: Secondary | ICD-10-CM | POA: Insufficient documentation

## 2012-06-15 LAB — HEPATIC FUNCTION PANEL
ALT: 31 U/L (ref 0–35)
AST: 29 U/L (ref 0–37)
Albumin: 3.7 g/dL (ref 3.5–5.2)
Alkaline Phosphatase: 54 U/L (ref 39–117)
Bilirubin, Direct: 0 mg/dL (ref 0.0–0.3)
Total Bilirubin: 0.3 mg/dL (ref 0.3–1.2)
Total Protein: 6.3 g/dL (ref 6.0–8.3)

## 2012-06-15 LAB — LIPID PANEL
Cholesterol: 186 mg/dL (ref 0–200)
HDL: 48.4 mg/dL (ref >39.00)
LDL Cholesterol: 119 mg/dL — ABNORMAL HIGH (ref 0–99)
Total CHOL/HDL Ratio: 4
Triglycerides: 92 mg/dL (ref 0.0–149.0)
VLDL: 18.4 mg/dL (ref 0.0–40.0)

## 2012-06-15 LAB — BASIC METABOLIC PANEL
BUN: 8 mg/dL (ref 6–23)
CO2: 26 mEq/L (ref 19–32)
Calcium: 8.9 mg/dL (ref 8.4–10.5)
Chloride: 106 mEq/L (ref 96–112)
Creatinine, Ser: 0.6 mg/dL (ref 0.4–1.2)
GFR: 108.32 mL/min (ref >60.00)
Glucose, Bld: 120 mg/dL — ABNORMAL HIGH (ref 70–99)
Potassium: 3.9 mEq/L (ref 3.5–5.1)
Sodium: 143 mEq/L (ref 135–145)

## 2012-06-15 LAB — CBC WITH DIFFERENTIAL/PLATELET
Basophils Absolute: 0 10*3/uL (ref 0.0–0.1)
Basophils Relative: 0.7 % (ref 0.0–3.0)
Eosinophils Absolute: 0.1 10*3/uL (ref 0.0–0.7)
Eosinophils Relative: 2.9 % (ref 0.0–5.0)
HCT: 42.3 % (ref 36.0–46.0)
Hemoglobin: 13.8 g/dL (ref 12.0–15.0)
Lymphocytes Relative: 26.9 % (ref 12.0–46.0)
Lymphs Abs: 1.3 10*3/uL (ref 0.7–4.0)
MCHC: 32.6 g/dL (ref 30.0–36.0)
MCV: 94.1 fl (ref 78.0–100.0)
Monocytes Absolute: 0.4 10*3/uL (ref 0.1–1.0)
Monocytes Relative: 8.3 % (ref 3.0–12.0)
Neutro Abs: 3 10*3/uL (ref 1.4–7.7)
Neutrophils Relative %: 61.2 % (ref 43.0–77.0)
Platelets: 133 10*3/uL — ABNORMAL LOW (ref 150.0–400.0)
RBC: 4.49 Mil/uL (ref 3.87–5.11)
RDW: 14.2 % (ref 11.5–14.6)
WBC: 4.9 10*3/uL (ref 4.5–10.5)

## 2012-06-15 LAB — HEMOGLOBIN A1C: Hgb A1c MFr Bld: 6.8 % — ABNORMAL HIGH (ref 4.6–6.5)

## 2012-06-15 LAB — TSH: TSH: 1.19 u[IU]/mL (ref 0.35–5.50)

## 2012-06-15 NOTE — Patient Instructions (Addendum)
Follow up in 3-4 months to recheck diabetes You look great!  Keep up the good work! We'll notify you of your lab results and make any changes if needed Call with any questions or concerns Happy Labor Day!!!

## 2012-06-15 NOTE — Progress Notes (Signed)
  Subjective:    Patient ID: Michelle Long, female    DOB: 1945-10-22, 66 y.o.   MRN: [id number]  HPI Here today for CPE.  Risk Factors: DM- chronic problem, on Januvia daily.  Asymptomatic.  No symptomatic lows- no CP, SOB, HAs, visual changes.  UTD on eye exam.  No numbness or tingling of hands/feet. Hyperlipidemia- chronic problem, on fish oil daily, not currently on statin.  Statin was causing muscle aches/itching.  Physical Activity:  Some exercise, walking Fall Risk: low risk, steady on feet Depression: no current concerns, feels sxs are well controlled. Hearing: normal to conversational tones and whispered voice. ADL's: independent Cognitive: normal linear thought process, memory and attention intact Home Safety: feels safe at home Height, Weight, BMI, Visual Acuity: see vitals, vision corrected to 20/20 w/ glasses Counseling: UTD on pap/mammo, plans to call GI re colonoscopy Labs Ordered: See A&P Care Plan: See A&P    Review of Systems Patient reports no vision/ hearing changes, adenopathy,fever, weight change,  persistant/recurrent hoarseness , swallowing issues, chest pain, palpitations, edema, persistant/recurrent cough, hemoptysis, dyspnea (rest/exertional/paroxysmal nocturnal), gastrointestinal bleeding (melena, rectal bleeding), abdominal pain, significant heartburn, bowel changes, GU symptoms (dysuria, hematuria, incontinence), Gyn symptoms (abnormal  bleeding, pain),  syncope, focal weakness, memory loss, numbness & tingling, skin/hair/nail changes, abnormal bruising or bleeding, anxiety, or depression.     Objective:   Physical Exam  General Appearance:    Alert, cooperative, no distress, appears stated age  Head:    Normocephalic, without obvious abnormality, atraumatic  Eyes:    PERRL, conjunctiva/corneas clear, EOM's intact, fundi    benign, both eyes  Ears:    Normal TM's and external ear canals, both ears  Nose:   Nares normal, septum midline, mucosa normal, no  drainage    or sinus tenderness  Throat:   Lips, mucosa, and tongue normal; teeth and gums normal  Neck:   Supple, symmetrical, trachea midline, no adenopathy;    Thyroid: no enlargement/tenderness/nodules  Back:     Symmetric, no curvature, ROM normal, no CVA tenderness  Lungs:     Clear to auscultation bilaterally, respirations unlabored  Chest Wall:    No tenderness or deformity   Heart:    Regular rate and rhythm, S1 and S2 normal, no murmur, rub   or gallop  Breast Exam:    No tenderness, masses, or nipple abnormality  Abdomen:     Soft, non-tender, bowel sounds active all four quadrants,    no masses, no organomegaly  Genitalia:    deferred  Rectal:    Extremities:   Extremities normal, atraumatic, no cyanosis or edema  Pulses:   2+ and symmetric all extremities  Skin:   Skin color, texture, turgor normal, no rashes or lesions  Lymph nodes:   Cervical, supraclavicular, and axillary nodes normal  Neurologic:   CNII-XII intact, normal strength, sensation and reflexes    throughout          Assessment & Plan:

## 2012-06-15 NOTE — Telephone Encounter (Signed)
Pt called to report that [place] is too expensive and would like to be changed to a cheaper med. .Please advise

## 2012-06-15 NOTE — Telephone Encounter (Signed)
We can continue to give her samples and she can try patient assistance (if her A1C is well controlled)

## 2012-06-15 NOTE — Telephone Encounter (Signed)
.  left message to have patient return my call.  

## 2012-06-16 NOTE — Telephone Encounter (Signed)
Spoke to pt to advise results/instructions. Pt understood. Placed samples at front desk for pt pick up, pt will contact company for next steps for pt assistance.

## 2012-06-17 LAB — VITAMIN D 1,25 DIHYDROXY
Vitamin D 1, 25 (OH)2 Total: 54 pg/mL (ref 18–72)
Vitamin D2 1, 25 (OH)2: <8 pg/mL
Vitamin D3 1, 25 (OH)2: 54 pg/mL

## 2012-06-28 NOTE — Assessment & Plan Note (Signed)
Chronic problem.  Tolerating meds w/out difficulty.  Check labs.  Adjust meds prn  

## 2012-06-28 NOTE — Assessment & Plan Note (Signed)
Pt's PE WNL.  Plans to call GI to reschedule colonoscopy.  UTD on mammo.  Check labs.  Anticipatory guidance provided.

## 2012-06-28 NOTE — Assessment & Plan Note (Signed)
Chronic problem.  Tolerating meds w/out difficulty.  UTD on eye exam.  Currently asymptomatic.  Check labs.  Adjust meds prn

## 2012-06-30 ENCOUNTER — Telehealth: Payer: Self-pay | Admitting: *Deleted

## 2012-06-30 NOTE — Telephone Encounter (Signed)
Pt left msg on triage vmail requesting lab results.  

## 2012-07-01 ENCOUNTER — Telehealth: Payer: Self-pay | Admitting: *Deleted

## 2012-07-01 NOTE — Telephone Encounter (Signed)
Discuss with patient  

## 2012-07-01 NOTE — Telephone Encounter (Signed)
Pt left VM that she was calling to get result of labs. Left message to call office.   Entered by Sheliah Hatch, MD at 06/15/2012 3:09 PM A1C has dropped nearly a whole point- this is great! Based on this, I would continue the Januvia- we can continue to give you samples or you can apply for patient assistance to help w/ the cost  Your cholesterol is holding steady since stopping the meds- this is good news!!  Overall, I'm very pleased! Keep up the good work!

## 2012-07-01 NOTE — Telephone Encounter (Signed)
Pt made aware results via phone note, pt understood

## 2012-07-10 ENCOUNTER — Other Ambulatory Visit: Payer: Self-pay | Admitting: Family Medicine

## 2012-07-10 MED ORDER — BUDESONIDE-FORMOTEROL FUMARATE 160-4.5 MCG/ACT IN AERO: 2.0000 | INHALATION_SPRAY | Freq: Two times a day (BID) | RESPIRATORY_TRACT | Status: DC

## 2012-07-10 NOTE — Telephone Encounter (Signed)
refill symbicourt 160-4.41mcg AER #10.2 inhale 2 puffs into the lungs 2 times daily -- last fill 2.28.13 Last ov 8.26.13 V70.9

## 2012-09-02 ENCOUNTER — Other Ambulatory Visit: Payer: Self-pay

## 2012-09-02 MED ORDER — SITAGLIPTIN PHOSPHATE 100 MG PO TABS: 100.0000 mg | ORAL_TABLET | Freq: Every day | ORAL | Status: DC

## 2012-09-02 NOTE — Telephone Encounter (Signed)
Pt called [facility] triage line stating she is in the donut whole and requesting Junuvia samples. OV 06/15/12 last filled 05/18/12 # 30 x 3   Plz advise  MW

## 2012-09-02 NOTE — Telephone Encounter (Signed)
Ok for [employer] x2 months if available

## 2012-09-02 NOTE — Telephone Encounter (Signed)
Pt aware samples placed up front. 

## 2012-09-23 ENCOUNTER — Other Ambulatory Visit: Payer: Self-pay | Admitting: Family Medicine

## 2012-09-23 MED ORDER — GLUCOSE BLOOD VI STRP: ORAL_STRIP | Status: DC

## 2012-09-23 NOTE — Telephone Encounter (Signed)
Refill one touch ultra tes #100 Use as directed last fill 11.08.12 last ov 8.26.13 V70.9

## 2012-09-23 NOTE — Telephone Encounter (Signed)
Last Ov 06-15-12, not on med list. .Please advise

## 2012-09-23 NOTE — Telephone Encounter (Signed)
Ok for #100, 6 refills

## 2012-09-23 NOTE — Telephone Encounter (Signed)
Test Strips sent.    MW

## 2012-10-06 ENCOUNTER — Encounter: Payer: Self-pay | Admitting: Family Medicine

## 2012-10-06 ENCOUNTER — Ambulatory Visit (INDEPENDENT_AMBULATORY_CARE_PROVIDER_SITE_OTHER): Payer: Medicare Other | Admitting: Family Medicine

## 2012-10-06 VITALS — BP 140/80 | HR 100 | Temp 98.1°F | Ht 63.75 in | Wt 168.2 lb

## 2012-10-06 DIAGNOSIS — E785 Hyperlipidemia, unspecified: Secondary | ICD-10-CM

## 2012-10-06 DIAGNOSIS — F4321 Adjustment disorder with depressed mood: Secondary | ICD-10-CM

## 2012-10-06 DIAGNOSIS — E119 Type 2 diabetes mellitus without complications: Secondary | ICD-10-CM

## 2012-10-06 DIAGNOSIS — E1165 Type 2 diabetes mellitus with hyperglycemia: Secondary | ICD-10-CM

## 2012-10-06 LAB — BASIC METABOLIC PANEL
BUN: 7 mg/dL (ref 6–23)
CO2: 24 mEq/L (ref 19–32)
Calcium: 9.3 mg/dL (ref 8.4–10.5)
Chloride: 105 mEq/L (ref 96–112)
Creatinine, Ser: 0.7 mg/dL (ref 0.4–1.2)
GFR: 86 mL/min (ref >60.00)
Glucose, Bld: 166 mg/dL — ABNORMAL HIGH (ref 70–99)
Potassium: 4.1 mEq/L (ref 3.5–5.1)
Sodium: 139 mEq/L (ref 135–145)

## 2012-10-06 LAB — HEPATIC FUNCTION PANEL
ALT: 29 U/L (ref 0–35)
AST: 30 U/L (ref 0–37)
Albumin: 4.1 g/dL (ref 3.5–5.2)
Alkaline Phosphatase: 66 U/L (ref 39–117)
Bilirubin, Direct: 0 mg/dL (ref 0.0–0.3)
Total Bilirubin: 0.6 mg/dL (ref 0.3–1.2)
Total Protein: 7.1 g/dL (ref 6.0–8.3)

## 2012-10-06 LAB — POCT GLYCOSYLATED HEMOGLOBIN (HGB A1C): Hemoglobin A1C: 6.9

## 2012-10-06 LAB — LIPID PANEL
Cholesterol: 185 mg/dL (ref 0–200)
HDL: 39.5 mg/dL (ref >39.00)
LDL Cholesterol: 130 mg/dL — ABNORMAL HIGH (ref 0–99)
Total CHOL/HDL Ratio: 5
Triglycerides: 80 mg/dL (ref 0.0–149.0)
VLDL: 16 mg/dL (ref 0.0–40.0)

## 2012-10-06 NOTE — Assessment & Plan Note (Signed)
Chronic problem.  Today's A1C 6.9.  Pt tolerating Januvia w/out difficulty.  UTD on eye exam.  Asymptomatic.  No changes.

## 2012-10-06 NOTE — Patient Instructions (Addendum)
Follow up in 1 month to recheck mood We'll notify you of your lab results and make any changes if needed Call with any questions or concerns HANG IN THERE!!!

## 2012-10-06 NOTE — Assessment & Plan Note (Signed)
New.  Pt still in shock.  Hx of bipolar- well controlled on current meds but will need close f/u given recent events.

## 2012-10-06 NOTE — Progress Notes (Signed)
  Subjective:    Patient ID: Michelle Long, female    DOB: 07-29-1946, 67 y.o.   MRN: [id number]  HPI DM- chronic problem, last labs showed good control.  On Januvia.  No symptomatic lows.  No CP, SOB, HAs, visual changes, edema.  Hyperlipidemia- pt was tried on multiple statins but had 'horrible muscle aches' on each.  Currently not on meds.  Due for labs.  Grief- grandson was killed this AM in MVA.  Tearful.  Obviously upset.   Review of Systems For ROS see HPI     Objective:   Physical Exam  Constitutional: She is oriented to person, place, and time. She appears well-developed and well-nourished. No distress.  HENT:  Head: Normocephalic and atraumatic.  Eyes: Conjunctivae normal and EOM are normal. Pupils are equal, round, and reactive to light.  Neck: Normal range of motion. Neck supple. No thyromegaly present.  Cardiovascular: Normal rate, regular rhythm, normal heart sounds and intact distal pulses.   No murmur heard. Pulmonary/Chest: Effort normal and breath sounds normal. No respiratory distress.  Abdominal: Soft. She exhibits no distension. There is no tenderness.  Musculoskeletal: She exhibits no edema.  Lymphadenopathy:    She has no cervical adenopathy.  Neurological: She is alert and oriented to person, place, and time.  Skin: Skin is warm and dry.  Psychiatric: Her behavior is normal. Thought content normal.       Tearful, understandably upset          Assessment & Plan:

## 2012-10-06 NOTE — Assessment & Plan Note (Signed)
Chronic problem, due for labs to determine whether pt requires Zetia or other medication.

## 2012-11-05 ENCOUNTER — Ambulatory Visit (INDEPENDENT_AMBULATORY_CARE_PROVIDER_SITE_OTHER): Payer: BC Managed Care – HMO | Admitting: Family Medicine

## 2012-11-05 ENCOUNTER — Encounter: Payer: Self-pay | Admitting: Family Medicine

## 2012-11-05 VITALS — BP 130/60 | HR 91 | Temp 98.1°F | Resp 18 | Ht 64.0 in | Wt 171.6 lb

## 2012-11-05 DIAGNOSIS — R030 Elevated blood-pressure reading, without diagnosis of hypertension: Secondary | ICD-10-CM

## 2012-11-05 DIAGNOSIS — IMO0001 Reserved for inherently not codable concepts without codable children: Secondary | ICD-10-CM | POA: Insufficient documentation

## 2012-11-05 DIAGNOSIS — F4321 Adjustment disorder with depressed mood: Secondary | ICD-10-CM

## 2012-11-05 NOTE — Assessment & Plan Note (Signed)
Improved.  Pt doing very well since sudden death of grandson.  No sxs of current bipolar.  Will continue to follow.

## 2012-11-05 NOTE — Patient Instructions (Addendum)
Schedule your diabetes f/u for March Keep up the good work!  You look great! Call me if you need anything! Keep hanging in there!!!

## 2012-11-05 NOTE — Progress Notes (Signed)
  Subjective:    Patient ID: Michelle Long, female    DOB: 12-25-1945, 66 y.o.   MRN: [id number]  HPI Grief- grandson was killed in MVA just before Christmas.  Pt feels she is in a good place w/ this loss.  Pt reports mood has been stable- her 2 prior 'bipolar episodes' were triggered by loss.  Pt is very aware of this.  Eating well, exercising regularly.  Currently has a therapist.   Elevated BP- BP looks good today.  No CP, SOB, HAs, visual changes, edema.  BP was elevated last visit due to circumstances.  Has never been on BP meds and has never had an issue.   Review of Systems For ROS see HPI     Objective:   Physical Exam  Vitals reviewed. Constitutional: She is oriented to person, place, and time. She appears well-developed and well-nourished. No distress.  HENT:  Head: Normocephalic and atraumatic.  Eyes: Conjunctivae normal and EOM are normal. Pupils are equal, round, and reactive to light.  Neck: Normal range of motion. Neck supple. No thyromegaly present.  Cardiovascular: Normal rate, regular rhythm, normal heart sounds and intact distal pulses.   No murmur heard. Pulmonary/Chest: Effort normal and breath sounds normal. No respiratory distress.  Abdominal: Soft. She exhibits no distension. There is no tenderness.  Musculoskeletal: She exhibits no edema.  Lymphadenopathy:    She has no cervical adenopathy.  Neurological: She is alert and oriented to person, place, and time.  Skin: Skin is warm and dry.  Psychiatric: She has a normal mood and affect. Her behavior is normal. Judgment and thought content normal.          Assessment & Plan:

## 2012-11-05 NOTE — Assessment & Plan Note (Signed)
Much improved today.  No need for meds.  Will continue to follow.

## 2012-11-16 ENCOUNTER — Other Ambulatory Visit: Payer: Medicare Other | Admitting: Lab

## 2012-11-16 ENCOUNTER — Ambulatory Visit: Payer: Medicare Other | Admitting: Hematology & Oncology

## 2012-11-26 ENCOUNTER — Other Ambulatory Visit (HOSPITAL_BASED_OUTPATIENT_CLINIC_OR_DEPARTMENT_OTHER): Payer: BC Managed Care – HMO | Admitting: Lab

## 2012-11-26 ENCOUNTER — Ambulatory Visit (HOSPITAL_BASED_OUTPATIENT_CLINIC_OR_DEPARTMENT_OTHER): Payer: BC Managed Care – HMO | Admitting: Hematology & Oncology

## 2012-11-26 ENCOUNTER — Telehealth: Payer: Self-pay | Admitting: *Deleted

## 2012-11-26 VITALS — BP 137/74 | HR 89 | Temp 97.7°F | Resp 16 | Ht 64.0 in | Wt 168.0 lb

## 2012-11-26 DIAGNOSIS — E119 Type 2 diabetes mellitus without complications: Secondary | ICD-10-CM

## 2012-11-26 DIAGNOSIS — C50919 Malignant neoplasm of unspecified site of unspecified female breast: Secondary | ICD-10-CM

## 2012-11-26 DIAGNOSIS — Z170 Estrogen receptor positive status [ER+]: Secondary | ICD-10-CM

## 2012-11-26 LAB — CBC WITH DIFFERENTIAL (CANCER CENTER ONLY)
BASO#: 0 10*3/uL (ref 0.0–0.2)
BASO%: 0.4 % (ref 0.0–2.0)
EOS%: 2.8 % (ref 0.0–7.0)
Eosinophils Absolute: 0.1 10*3/uL (ref 0.0–0.5)
HCT: 44 % (ref 34.8–46.6)
HGB: 14.7 g/dL (ref 11.6–15.9)
LYMPH#: 1.6 10*3/uL (ref 0.9–3.3)
LYMPH%: 31.6 % (ref 14.0–48.0)
MCH: 31.1 pg (ref 26.0–34.0)
MCHC: 33.4 g/dL (ref 32.0–36.0)
MCV: 93 fL (ref 81–101)
MONO#: 0.4 10*3/uL (ref 0.1–0.9)
MONO%: 8.1 % (ref 0.0–13.0)
NEUT#: 2.8 10*3/uL (ref 1.5–6.5)
NEUT%: 57.1 % (ref 39.6–80.0)
Platelets: 148 10*3/uL (ref 145–400)
RBC: 4.73 10*6/uL (ref 3.70–5.32)
RDW: 12.4 % (ref 11.1–15.7)
WBC: 4.9 10*3/uL (ref 3.9–10.0)

## 2012-11-26 MED ORDER — EZETIMIBE 10 MG PO TABS: 10.0000 mg | ORAL_TABLET | Freq: Every day | ORAL | Status: DC

## 2012-11-26 MED ORDER — TAMOXIFEN CITRATE 20 MG PO TABS: 20.0000 mg | ORAL_TABLET | Freq: Every day | ORAL | Status: DC

## 2012-11-26 NOTE — Progress Notes (Signed)
This office note has been dictated.

## 2012-11-26 NOTE — Telephone Encounter (Signed)
Pt called back stating that she has not had any response to the Zetia as of yet and would like to have Rx sent to pharmacy. Rx sent

## 2012-11-27 LAB — COMPREHENSIVE METABOLIC PANEL
ALT: 39 U/L — ABNORMAL HIGH (ref 0–35)
AST: 44 U/L — ABNORMAL HIGH (ref 0–37)
Albumin: 4.4 g/dL (ref 3.5–5.2)
Alkaline Phosphatase: 71 U/L (ref 39–117)
BUN: 10 mg/dL (ref 6–23)
CO2: 24 mEq/L (ref 19–32)
Calcium: 9.1 mg/dL (ref 8.4–10.5)
Chloride: 103 mEq/L (ref 96–112)
Creatinine, Ser: 0.85 mg/dL (ref 0.50–1.10)
Glucose, Bld: 183 mg/dL — ABNORMAL HIGH (ref 70–99)
Potassium: 4 mEq/L (ref 3.5–5.3)
Sodium: 138 mEq/L (ref 135–145)
Total Bilirubin: 0.5 mg/dL (ref 0.3–1.2)
Total Protein: 6.6 g/dL (ref 6.0–8.3)

## 2012-11-27 LAB — VITAMIN D 25 HYDROXY (VIT D DEFICIENCY, FRACTURES): Vit D, 25-Hydroxy: 42 ng/mL (ref 30–89)

## 2012-11-27 NOTE — Progress Notes (Signed)
CC:   Neena Rhymes, M.D.  DIAGNOSIS:  Stage IIA (T2 N0 M0) infiltrating ductal carcinoma of the left breast.  CURRENT THERAPY:  Tamoxifen 20 mg p.o. daily.  INTERIM HISTORY:  Ms. Correia comes in for her followup.  She is doing quite well.  We see her every 6 months.  She has had no issues with nausea or vomiting.  There has been no bony pain.  Unfortunately, her grandson died in a car accident over Christmas.  This has been very tough on the whole family.  Ms. Tugman is showing a lot of strength.  She is having no problems with the tamoxifen.  She is taking aspirin.  She is on I think vitamin D.  She is due for a mammogram this summer.  She is diabetic.  Her blood sugars apparently have been fairly well control.  PHYSICAL EXAMINATION:  General:  This is a well-developed, well- nourished white female in no obvious distress.  Vital signs: Temperature of 97.7, pulse 89, respiratory rate 16, blood pressure 137/74.  Weight is 168.  Head and neck: Normocephalic, atraumatic skull. There are no ocular or oral lesions.  There are no palpable cervical or supraclavicular lymph nodes.  Lungs:  Clear bilaterally.  Cardiac: Regular rate and rhythm with a normal S1 and S2.  There are no murmurs, rubs, or bruits.  Abdomen:  Soft with good bowel sounds.  There is no palpable abdominal mass.  There is no palpable hepatosplenomegaly. Breasts:  Right breast with no masses, edema, or erythema.  There is no right axillary adenopathy.  Left breast shows slightly contracted breast.  She has a well-healed lumpectomy at the 3 o'clock position. There is some slight firmness at the lumpectomy site.  No distinct mass is noted in the left breast.  There is no left axillary adenopathy. Back:  No tenderness over the spine, ribs, or hips.  There is no kyphosis or osteoporotic changes.  Extremities:  No clubbing, cyanosis, or edema.  No lymphedema is noted in the left arm.  LABORATORY STUDIES:  White  cell count is 4.9, hemoglobin 14.7, hematocrit 44, platelet count 148.  IMPRESSION:  Ms. Bentivegna is a very nice 67 year old white female with stage IIA infiltrating ductal carcinoma of the left breast.  She is ER positive.  She did receive 4 cycles of adjuvant chemotherapy followed by Herceptin.  Obviously, her tumor is HER2 positive.  We will continue with the tamoxifen for 10 years.  We will go ahead and plan to get her back in 6 more months.    ______________________________ Josph Macho, M.D. PRE/MEDQ  D:  11/26/2012  T:  11/27/2012  Job:  [id number]

## 2012-12-25 ENCOUNTER — Ambulatory Visit: Payer: BC Managed Care – HMO | Admitting: Family Medicine

## 2012-12-26 ENCOUNTER — Other Ambulatory Visit: Payer: Self-pay | Admitting: Family Medicine

## 2012-12-30 ENCOUNTER — Ambulatory Visit (INDEPENDENT_AMBULATORY_CARE_PROVIDER_SITE_OTHER): Payer: BC Managed Care – HMO | Admitting: Family Medicine

## 2012-12-30 ENCOUNTER — Encounter: Payer: Self-pay | Admitting: Family Medicine

## 2012-12-30 VITALS — BP 128/70 | HR 93 | Temp 98.4°F | Ht 64.0 in | Wt 167.6 lb

## 2012-12-30 DIAGNOSIS — E1165 Type 2 diabetes mellitus with hyperglycemia: Secondary | ICD-10-CM

## 2012-12-30 DIAGNOSIS — E119 Type 2 diabetes mellitus without complications: Secondary | ICD-10-CM

## 2012-12-30 LAB — BASIC METABOLIC PANEL
BUN: 8 mg/dL (ref 6–23)
CO2: 27 mEq/L (ref 19–32)
Calcium: 8.9 mg/dL (ref 8.4–10.5)
Chloride: 105 mEq/L (ref 96–112)
Creatinine, Ser: 0.8 mg/dL (ref 0.4–1.2)
GFR: 73.97 mL/min (ref >60.00)
Glucose, Bld: 145 mg/dL — ABNORMAL HIGH (ref 70–99)
Potassium: 4 mEq/L (ref 3.5–5.1)
Sodium: 140 mEq/L (ref 135–145)

## 2012-12-30 LAB — HEMOGLOBIN A1C: Hgb A1c MFr Bld: 8.4 % — ABNORMAL HIGH (ref 4.6–6.5)

## 2012-12-30 NOTE — Progress Notes (Signed)
  Subjective:    Patient ID: Michelle Long, female    DOB: 1945/12/09, 67 y.o.   MRN: [id number]  HPI DM- chronic problem, on Januvia daily.  Due to weight gain pt stopped Zyprexa and switched to Saphris.  Not checking sugars.  UTD on eye exam.  No numbness/tingling hands or feet.  No CP, SOB, HAs, visual changes, abd pain, N/V, symptomatic lows.   Review of Systems For ROS see HPI     Objective:   Physical Exam  Vitals reviewed. Constitutional: She is oriented to person, place, and time. She appears well-developed and well-nourished. No distress.  HENT:  Head: Normocephalic and atraumatic.  Eyes: Conjunctivae and EOM are normal. Pupils are equal, round, and reactive to light.  Neck: Normal range of motion. Neck supple. No thyromegaly present.  Cardiovascular: Normal rate, regular rhythm, normal heart sounds and intact distal pulses.   No murmur heard. Pulmonary/Chest: Effort normal and breath sounds normal. No respiratory distress.  Abdominal: Soft. She exhibits no distension. There is no tenderness.  Musculoskeletal: She exhibits no edema.  Lymphadenopathy:    She has no cervical adenopathy.  Neurological: She is alert and oriented to person, place, and time.  Skin: Skin is warm and dry.  Psychiatric: She has a normal mood and affect. Her behavior is normal.          Assessment & Plan:

## 2012-12-30 NOTE — Assessment & Plan Note (Signed)
Pt tolerating Januvia w/out difficulty.  Not checking sugars.  UTD on eye exam.  Foot exam done today.  Asymptomatic.  Check labs.  Adjust meds prn.

## 2012-12-30 NOTE — Patient Instructions (Addendum)
Follow up in 3 months to recheck cholesterol and diabetes Keep up the good work!  You look good! We'll notify you of your lab results and make any changes if needed Call with any questions or concerns Happy Spring!

## 2012-12-31 ENCOUNTER — Telehealth: Payer: Self-pay | Admitting: *Deleted

## 2012-12-31 DIAGNOSIS — K219 Gastro-esophageal reflux disease without esophagitis: Secondary | ICD-10-CM

## 2012-12-31 MED ORDER — PANTOPRAZOLE SODIUM 40 MG PO TBEC: 40.0000 mg | DELAYED_RELEASE_TABLET | Freq: Every day | ORAL | Status: DC

## 2012-12-31 NOTE — Telephone Encounter (Signed)
Refill for protonix sent to [facility] per pt request

## 2013-01-01 ENCOUNTER — Telehealth: Payer: Self-pay | Admitting: Family Medicine

## 2013-01-01 DIAGNOSIS — J441 Chronic obstructive pulmonary disease with (acute) exacerbation: Secondary | ICD-10-CM

## 2013-01-01 MED ORDER — ALBUTEROL SULFATE HFA 108 (90 BASE) MCG/ACT IN AERS: 2.0000 | INHALATION_SPRAY | Freq: Four times a day (QID) | RESPIRATORY_TRACT | Status: DC | PRN

## 2013-01-01 NOTE — Telephone Encounter (Signed)
per hand wrt request from pharmacy "PT is requesting refills on her ProAir she is new to [place] please send new RX)

## 2013-01-01 NOTE — Telephone Encounter (Signed)
Refill for Pro-Air [facility] to [facility] [address] per pt request. Pt also wanting lab results from recent blood work, advised Hgb A1c has increased since last check. Pt explained that it was due to a new med prescribed by another MD and that she would be working hard to decrease that value prior to the next OV.

## 2013-01-06 ENCOUNTER — Telehealth: Payer: Self-pay | Admitting: Family Medicine

## 2013-01-12 ENCOUNTER — Other Ambulatory Visit: Payer: Self-pay | Admitting: *Deleted

## 2013-01-12 NOTE — Telephone Encounter (Signed)
Refill for [facility]Air called [facility]

## 2013-02-01 ENCOUNTER — Telehealth: Payer: Self-pay | Admitting: Family Medicine

## 2013-02-01 DIAGNOSIS — K219 Gastro-esophageal reflux disease without esophagitis: Secondary | ICD-10-CM

## 2013-02-01 NOTE — Telephone Encounter (Signed)
*  New Pharmacy: [facility]* Refill: Pantoprazole 40 mg tab. 1 po qd.

## 2013-02-02 MED ORDER — PANTOPRAZOLE SODIUM 40 MG PO TBEC: 40.0000 mg | DELAYED_RELEASE_TABLET | Freq: Every day | ORAL | Status: DC

## 2013-02-02 NOTE — Telephone Encounter (Signed)
Rx sent to the pharmacy by e-script.//AB/CMA 

## 2013-02-03 ENCOUNTER — Other Ambulatory Visit: Payer: Self-pay | Admitting: *Deleted

## 2013-02-03 DIAGNOSIS — C50919 Malignant neoplasm of unspecified site of unspecified female breast: Secondary | ICD-10-CM

## 2013-02-03 MED ORDER — TAMOXIFEN CITRATE 20 MG PO TABS: 20.0000 mg | ORAL_TABLET | Freq: Every day | ORAL | Status: DC

## 2013-02-03 NOTE — Telephone Encounter (Signed)
error 

## 2013-02-04 ENCOUNTER — Encounter: Payer: Self-pay | Admitting: Family Medicine

## 2013-02-04 ENCOUNTER — Ambulatory Visit: Payer: BC Managed Care – HMO | Admitting: Family Medicine

## 2013-02-04 ENCOUNTER — Ambulatory Visit (INDEPENDENT_AMBULATORY_CARE_PROVIDER_SITE_OTHER): Payer: BC Managed Care – HMO | Admitting: Family Medicine

## 2013-02-04 VITALS — BP 118/80 | HR 103 | Temp 98.7°F | Wt 168.0 lb

## 2013-02-04 DIAGNOSIS — D229 Melanocytic nevi, unspecified: Secondary | ICD-10-CM

## 2013-02-04 DIAGNOSIS — L309 Dermatitis, unspecified: Secondary | ICD-10-CM

## 2013-02-04 DIAGNOSIS — L259 Unspecified contact dermatitis, unspecified cause: Secondary | ICD-10-CM

## 2013-02-04 DIAGNOSIS — D239 Other benign neoplasm of skin, unspecified: Secondary | ICD-10-CM

## 2013-02-04 MED ORDER — MOMETASONE FUROATE 0.1 % EX CREA: TOPICAL_CREAM | Freq: Every day | CUTANEOUS | Status: DC

## 2013-02-04 NOTE — Progress Notes (Signed)
  Subjective:     Michelle Long is a 67 y.o. female who presents for evaluation of a rash involving the lower leg. Rash started several days ago. Lesions are pink, and raised in texture. Rash has not changed over time. Rash is pruritic. Associated symptoms: none. Patient denies: abdominal pain, arthralgia, congestion, cough, crankiness, decrease in appetite, decrease in energy level, fever, headache, irritability, myalgia, nausea, sore throat and vomiting. Patient has not had contacts with similar rash. Patient has not had new exposures (soaps, lotions, laundry detergents, foods, medications, plants, insects or animals).  Pt also has multiple moles she wants [place] to look at---on face and R thigh  The following portions of the patient's history were reviewed and updated as appropriate: allergies, current medications, past family history, past medical history, past social history, past surgical history and problem list.  Review of Systems Pertinent items are noted in HPI.    Objective:    BP 118/80  Pulse 103  Temp(Src) 98.7 F (37.1 C) (Oral)  Wt 168 lb (76.204 kg)  BMI 28.82 kg/m2  SpO2 95% General:  alert, cooperative, appears stated age and no distress  Skin:  erythema noted on low ext with dry skin and escoriations and moles noted on face and r upper thigh   + irregular moles on either side nose, irregular borders and color Dark raised mole on upper R thigh.  Assessment:    eczema and atypical moles    Plan:    Medications: moisturizing cream and topical steroid: elocon. verbal and written patient instruction given. Follow up in several days. [----prn Refer to derm for moles

## 2013-02-04 NOTE — Patient Instructions (Signed)
Mix elocon cream with eucerin cream 50/50 and use 1x daily----you can use eucerin as much as you want.

## 2013-03-01 ENCOUNTER — Other Ambulatory Visit: Payer: Self-pay | Admitting: *Deleted

## 2013-03-01 DIAGNOSIS — K219 Gastro-esophageal reflux disease without esophagitis: Secondary | ICD-10-CM

## 2013-03-01 MED ORDER — PANTOPRAZOLE SODIUM 40 MG PO TBEC: 40.0000 mg | DELAYED_RELEASE_TABLET | Freq: Every day | ORAL | Status: DC

## 2013-03-01 NOTE — Telephone Encounter (Signed)
Rx sent 

## 2013-03-09 ENCOUNTER — Telehealth: Payer: Self-pay | Admitting: Family Medicine

## 2013-03-09 NOTE — Telephone Encounter (Signed)
Patient called requesting samples of zetia 10mg  and januvia 100mg . Call [phone] when ready for pick up.

## 2013-03-10 MED ORDER — SITAGLIPTIN PHOSPHATE 100 MG PO TABS: 100.0000 mg | ORAL_TABLET | Freq: Every day | ORAL | Status: DC

## 2013-03-10 MED ORDER — EZETIMIBE 10 MG PO TABS: 10.0000 mg | ORAL_TABLET | Freq: Every day | ORAL | Status: DC

## 2013-03-10 NOTE — Telephone Encounter (Signed)
Ok for samples of both but please ask if she has been taking them regularly.  Also provide refills if needed.

## 2013-03-10 NOTE — Telephone Encounter (Signed)
Please advise if ok for samples. Pt last seen 12/2012 and recent labs completed within 6 months. No current Rx's on file for either med.

## 2013-03-10 NOTE — Telephone Encounter (Signed)
Pt is taking meds regularly. Rx sent for both medications. Pt aware samples up front for pick up.

## 2013-04-01 ENCOUNTER — Encounter: Payer: Self-pay | Admitting: Family Medicine

## 2013-04-01 ENCOUNTER — Ambulatory Visit (INDEPENDENT_AMBULATORY_CARE_PROVIDER_SITE_OTHER): Payer: BC Managed Care – HMO | Admitting: Family Medicine

## 2013-04-01 VITALS — BP 130/70 | HR 92 | Temp 98.6°F | Ht 64.0 in | Wt 164.8 lb

## 2013-04-01 DIAGNOSIS — E785 Hyperlipidemia, unspecified: Secondary | ICD-10-CM

## 2013-04-01 DIAGNOSIS — E119 Type 2 diabetes mellitus without complications: Secondary | ICD-10-CM

## 2013-04-01 DIAGNOSIS — E1165 Type 2 diabetes mellitus with hyperglycemia: Secondary | ICD-10-CM

## 2013-04-01 LAB — BASIC METABOLIC PANEL
BUN: 8 mg/dL (ref 6–23)
CO2: 28 mEq/L (ref 19–32)
Calcium: 9.1 mg/dL (ref 8.4–10.5)
Chloride: 108 mEq/L (ref 96–112)
Creatinine, Ser: 0.8 mg/dL (ref 0.4–1.2)
GFR: 80.68 mL/min (ref >60.00)
Glucose, Bld: 112 mg/dL — ABNORMAL HIGH (ref 70–99)
Potassium: 4.2 mEq/L (ref 3.5–5.1)
Sodium: 140 mEq/L (ref 135–145)

## 2013-04-01 LAB — LIPID PANEL
Cholesterol: 164 mg/dL (ref 0–200)
HDL: 40.9 mg/dL (ref >39.00)
LDL Cholesterol: 101 mg/dL — ABNORMAL HIGH (ref 0–99)
Total CHOL/HDL Ratio: 4
Triglycerides: 113 mg/dL (ref 0.0–149.0)
VLDL: 22.6 mg/dL (ref 0.0–40.0)

## 2013-04-01 LAB — HEPATIC FUNCTION PANEL
ALT: 42 U/L — ABNORMAL HIGH (ref 0–35)
AST: 41 U/L — ABNORMAL HIGH (ref 0–37)
Albumin: 3.8 g/dL (ref 3.5–5.2)
Alkaline Phosphatase: 52 U/L (ref 39–117)
Bilirubin, Direct: 0 mg/dL (ref 0.0–0.3)
Total Bilirubin: 0.4 mg/dL (ref 0.3–1.2)
Total Protein: 6.3 g/dL (ref 6.0–8.3)

## 2013-04-01 LAB — HEMOGLOBIN A1C: Hgb A1c MFr Bld: 7 % — ABNORMAL HIGH (ref 4.6–6.5)

## 2013-04-01 NOTE — Assessment & Plan Note (Signed)
Chronic problem.  Has made dramatic lifestyle changes since last visit.  Wants to wean off Januvia if possible.  Check labs.  Adjust meds prn

## 2013-04-01 NOTE — Assessment & Plan Note (Signed)
Chronic problem.  Tolerating Zetia w/out difficulty.  Has made drastic lifestyle changes.  Goal is to stop meds.  Check labs.  Adjust meds prn

## 2013-04-01 NOTE — Progress Notes (Signed)
  Subjective:    Patient ID: Michelle Long, female    DOB: 17-Mar-1946, 67 y.o.   MRN: [id number]  HPI DM- chronic problem, last A1C was elevated.  Read book 'Reversing Diabetes' has changed lifestyle.  Exercising 6 days/week.  Losing weight.  On Januvia daily.  No symptomatic lows.  CBGs 90-100s.  No CP, SOB, HAs, visual changes, edema.  Hyperlipidemia- chronic problem, on Zetia.  No abd pain, N/V, myalgias.   Review of Systems For ROS see HPI     Objective:   Physical Exam  Vitals reviewed. Constitutional: She is oriented to person, place, and time. She appears well-developed and well-nourished. No distress.  HENT:  Head: Normocephalic and atraumatic.  Eyes: Conjunctivae and EOM are normal. Pupils are equal, round, and reactive to light.  Neck: Normal range of motion. Neck supple. No thyromegaly present.  Cardiovascular: Normal rate, regular rhythm, normal heart sounds and intact distal pulses.   No murmur heard. Pulmonary/Chest: Effort normal and breath sounds normal. No respiratory distress.  Abdominal: Soft. She exhibits no distension. There is no tenderness.  Musculoskeletal: She exhibits no edema.  Lymphadenopathy:    She has no cervical adenopathy.  Neurological: She is alert and oriented to person, place, and time.  Skin: Skin is warm and dry.  Psychiatric: She has a normal mood and affect. Her behavior is normal.          Assessment & Plan:

## 2013-04-01 NOTE — Patient Instructions (Addendum)
Schedule your complete physical for August Keep up the good work!  You look great! We'll notify you of your lab results and make any changes if needed Call with any questions or concerns Hang in there!!!

## 2013-04-02 ENCOUNTER — Other Ambulatory Visit: Payer: Self-pay | Admitting: *Deleted

## 2013-04-02 MED ORDER — GLUCOSE BLOOD VI STRP: ORAL_STRIP | Status: DC

## 2013-04-02 MED ORDER — ONETOUCH DELICA LANCETS FINE MISC: 1.0000 | Freq: Every day | Status: DC

## 2013-04-02 NOTE — Telephone Encounter (Signed)
Pt left VM that she need to get test strips and lancet sent in to pharmacy.. Rx sent Pt aware.

## 2013-04-03 ENCOUNTER — Ambulatory Visit (INDEPENDENT_AMBULATORY_CARE_PROVIDER_SITE_OTHER): Payer: BC Managed Care – HMO | Admitting: Family Medicine

## 2013-04-03 VITALS — BP 152/88 | HR 81 | Temp 97.5°F | Wt 166.0 lb

## 2013-04-03 DIAGNOSIS — B353 Tinea pedis: Secondary | ICD-10-CM

## 2013-04-03 NOTE — Assessment & Plan Note (Signed)
Topical tx, f/u prn.  Skin flag could have been related to sandals. D/w pt.  No other intervention needed.

## 2013-04-03 NOTE — Progress Notes (Signed)
L 1st toe pain today.  Not tender yesterday.  No trauma, no trigger known.   Had changed shoes/sandals yesterday.  She feels well except for the L 1st toe and isn't isn't as tender now as it was earlier today.  Some pain with weight bearing.  No h/o gout.   A1c 7.0 when checked 2 days ago.    Meds, vitals, and allergies reviewed.   ROS: See HPI.  Otherwise, noncontributory.  L foot with normal inspection except for older chronic nail changes Plantar side of 1st toe with a small skin flap (a few mm in length) that is superficial and not infected.   Small area of likely athlete's foot in the L 1st web space.

## 2013-04-03 NOTE — Patient Instructions (Signed)
Use an over the counter athlete's foot cream or spray. Keep your foot clean and dry otherwise.  This should resolve.  Take care.

## 2013-04-05 ENCOUNTER — Telehealth: Payer: Self-pay | Admitting: Family Medicine

## 2013-04-05 NOTE — Telephone Encounter (Signed)
Patient was seen on 04/03/13 at [facility].  [facility] Triage Call Report Triage Record Num: [id number] Operator: Hillary Bow Patient Name: Michelle Long Call Date & Time: 04/03/2013 12:23:50PM Patient Phone: [phone] PCP: Lezlie Octave Patient Gender: Female PCP Fax : [phone] Patient DOB: 1946-01-09 Practice Name: [facility] Reason for Call: [name]: [name]/Patient; PCP: Sheliah Hatch.; CB#: [phone]; Call regarding Toe pain, onset 6-14. Pt noticed a small cut on bottom of Big Toe. Toe is slightly swelling,w/ slight redness on top of Toe. Afebrile. Finger Stick 121 on 6-14. Diabetes Foot Problems protocol used, call provider, due to signs of worsening soft tissue infection. Consulted w/ [facility] office, Dr Para March, before scheduled 1245 appt on 6-14 due to Pt will be approx 5 to 10 min late making appt due to transit time. MD is ok w/ Pt coming on to appt. Pt verbalized understanding. Protocol(s) Used: Diabetes: Foot Problems Recommended Outcome per Protocol: Call Provider Immediately Reason for Outcome: Any signs and symptoms of worsening soft tissue infection Care Advice: ~

## 2013-04-06 NOTE — Telephone Encounter (Signed)
Patient was seen on 04/03/13 at [facility].//AB/CMA

## 2013-04-07 ENCOUNTER — Other Ambulatory Visit: Payer: Self-pay | Admitting: Family Medicine

## 2013-04-08 ENCOUNTER — Ambulatory Visit: Payer: BC Managed Care – HMO | Admitting: Family Medicine

## 2013-04-22 ENCOUNTER — Other Ambulatory Visit: Payer: Self-pay | Admitting: Family Medicine

## 2013-04-22 NOTE — Telephone Encounter (Signed)
Refill done.  

## 2013-04-30 ENCOUNTER — Other Ambulatory Visit: Payer: Self-pay | Admitting: Family Medicine

## 2013-04-30 DIAGNOSIS — Z9889 Other specified postprocedural states: Secondary | ICD-10-CM

## 2013-04-30 DIAGNOSIS — Z853 Personal history of malignant neoplasm of breast: Secondary | ICD-10-CM

## 2013-05-20 ENCOUNTER — Ambulatory Visit
Admission: RE | Admit: 2013-05-20 | Discharge: 2013-05-20 | Disposition: A | Discharge status: Home / Self Care | Payer: BC Managed Care – HMO | Source: Ambulatory Visit | Attending: Family Medicine | Admitting: Family Medicine

## 2013-05-20 DIAGNOSIS — Z853 Personal history of malignant neoplasm of breast: Secondary | ICD-10-CM

## 2013-05-20 DIAGNOSIS — Z9889 Other specified postprocedural states: Secondary | ICD-10-CM

## 2013-05-26 ENCOUNTER — Other Ambulatory Visit: Payer: Self-pay

## 2013-05-27 ENCOUNTER — Other Ambulatory Visit (HOSPITAL_BASED_OUTPATIENT_CLINIC_OR_DEPARTMENT_OTHER): Payer: BC Managed Care – HMO | Admitting: Lab

## 2013-05-27 ENCOUNTER — Ambulatory Visit (HOSPITAL_BASED_OUTPATIENT_CLINIC_OR_DEPARTMENT_OTHER): Payer: BC Managed Care – HMO | Admitting: Hematology & Oncology

## 2013-05-27 VITALS — BP 123/68 | HR 78 | Temp 98.0°F | Resp 16 | Ht 64.0 in | Wt 162.0 lb

## 2013-05-27 DIAGNOSIS — C50912 Malignant neoplasm of unspecified site of left female breast: Secondary | ICD-10-CM

## 2013-05-27 DIAGNOSIS — C50919 Malignant neoplasm of unspecified site of unspecified female breast: Secondary | ICD-10-CM

## 2013-05-27 DIAGNOSIS — C679 Malignant neoplasm of bladder, unspecified: Secondary | ICD-10-CM

## 2013-05-27 DIAGNOSIS — Z170 Estrogen receptor positive status [ER+]: Secondary | ICD-10-CM

## 2013-05-27 LAB — COMPREHENSIVE METABOLIC PANEL
ALT: 42 U/L — ABNORMAL HIGH (ref 0–35)
AST: 42 U/L — ABNORMAL HIGH (ref 0–37)
Albumin: 4 g/dL (ref 3.5–5.2)
Alkaline Phosphatase: 49 U/L (ref 39–117)
BUN: 9 mg/dL (ref 6–23)
CO2: 25 mEq/L (ref 19–32)
Calcium: 8.9 mg/dL (ref 8.4–10.5)
Chloride: 108 mEq/L (ref 96–112)
Creatinine, Ser: 0.82 mg/dL (ref 0.50–1.10)
Glucose, Bld: 160 mg/dL — ABNORMAL HIGH (ref 70–99)
Potassium: 4.4 mEq/L (ref 3.5–5.3)
Sodium: 141 mEq/L (ref 135–145)
Total Bilirubin: 0.3 mg/dL (ref 0.3–1.2)
Total Protein: 6.1 g/dL (ref 6.0–8.3)

## 2013-05-27 LAB — CBC WITH DIFFERENTIAL (CANCER CENTER ONLY)
BASO#: 0 10*3/uL (ref 0.0–0.2)
BASO%: 0.4 % (ref 0.0–2.0)
EOS%: 1.5 % (ref 0.0–7.0)
Eosinophils Absolute: 0.1 10*3/uL (ref 0.0–0.5)
HCT: 42.8 % (ref 34.8–46.6)
HGB: 14.3 g/dL (ref 11.6–15.9)
LYMPH#: 1.4 10*3/uL (ref 0.9–3.3)
LYMPH%: 25.9 % (ref 14.0–48.0)
MCH: 32.5 pg (ref 26.0–34.0)
MCHC: 33.4 g/dL (ref 32.0–36.0)
MCV: 97 fL (ref 81–101)
MONO#: 0.4 10*3/uL (ref 0.1–0.9)
MONO%: 7.3 % (ref 0.0–13.0)
NEUT#: 3.6 10*3/uL (ref 1.5–6.5)
NEUT%: 64.9 % (ref 39.6–80.0)
Platelets: 144 10*3/uL — ABNORMAL LOW (ref 145–400)
RBC: 4.4 10*6/uL (ref 3.70–5.32)
RDW: 12.1 % (ref 11.1–15.7)
WBC: 5.5 10*3/uL (ref 3.9–10.0)

## 2013-05-27 LAB — VITAMIN D 25 HYDROXY (VIT D DEFICIENCY, FRACTURES): Vit D, 25-Hydroxy: 50 ng/mL (ref 30–89)

## 2013-05-27 NOTE — Progress Notes (Signed)
This office note has been dictated.

## 2013-05-28 NOTE — Progress Notes (Signed)
CC:   Michelle Long, M.D.  DIAGNOSIS:  Stage IIA (T2 N0 M0) ductal carcinoma of the left breast.  CURRENT THERAPY:  Tamoxifen 20 mg p.o. daily (patient has been on this for 7 years).  INTERIM HISTORY:  Michelle Long comes in for followup.  She is doing quite well.  We see her every 6 months.  She has had no problems since we last saw her.  She and her family were on vacation at [facility].  They had a nice time.  She has had no problems with cough or shortness of breath.  She got through the wintertime without too much trouble.  She had a mammogram done on July 31st.  The mammogram looked fine with no suspicious hypercalcifications.  There has been no problems with rashes.  There has been no leg swelling. She has had no lymphedema of the left arm.  Overall, her performance status is ECOG zero.  She is on quite a few medications.  She does have diabetes.  PHYSICAL EXAMINATION:  General:  This is a well-developed, well- nourished white female in no obvious distress.  Vital signs: Temperature 98, pulse 78, respiratory rate 18, blood pressure 123/68. Weight 162.  Head and Neck:  Normocephalic, atraumatic skull.  There are no ocular or oral lesions.  There are no palpable cervical or supraclavicular lymph nodes.  Lungs:  Clear bilaterally.  Cardiac: Regular rate and rhythm with a normal S1 and S2.  There are no murmurs, rubs or bruits.  Breasts:  Right breast with no masses, edema or erythema.  There is no right axillary adenopathy.  The left breast is somewhat contracted from surgery and radiation.  She has a healed lumpectomy scar at the 7 o'clock position just adjacent to the areola. There is some firmness at the lumpectomy site.  No distinct mass noted in the left breast.  There is no left axillary adenopathy.  Abdomen: Soft.  She has good bowel sounds.  There is no fluid wave.  There is no palpable hepatosplenomegaly.  Back:  No tenderness over the spine, ribs, or hips.   Extremities:  No clubbing, cyanosis or edema.  There is no lymphedema of the left arm.  Neurologic:  No focal neurological deficits.  LABORATORY STUDIES:  White cell count 5.5, hemoglobin 14.3, hematocrit 42.8, platelet count 144.  IMPRESSION:  Michelle Long is a very charming 67 year old white female with stage IIA ductal carcinoma of the left breast.  She is on tamoxifen. She underwent surgery 7 years ago.  Of note, her tumor is triple positive.  I plan for 10 years of tamoxifen.  We will get her back in 6 more months.  I do not see a need for any labs or x-rays in between visits.    ______________________________ Josph Macho, M.D. PRE/MEDQ  D:  05/27/2013  T:  05/28/2013  Job:  [id number]

## 2013-05-31 ENCOUNTER — Telehealth: Payer: Self-pay | Admitting: Oncology

## 2013-05-31 NOTE — Telephone Encounter (Addendum)
Message copied by Lacie Draft on Mon May 31, 2013  3:53 PM ------      Message from: Josph Macho      Created: Fri May 28, 2013  6:18 PM       Call - labs and vit D are ok!!  Cindee Lame ------Spoke with patient.

## 2013-06-22 ENCOUNTER — Encounter: Payer: Self-pay | Admitting: Family Medicine

## 2013-06-22 ENCOUNTER — Ambulatory Visit (INDEPENDENT_AMBULATORY_CARE_PROVIDER_SITE_OTHER): Payer: BC Managed Care – HMO | Admitting: Family Medicine

## 2013-06-22 VITALS — BP 118/80 | HR 99 | Temp 98.1°F | Ht 64.0 in | Wt 163.4 lb

## 2013-06-22 DIAGNOSIS — Z23 Encounter for immunization: Secondary | ICD-10-CM

## 2013-06-22 DIAGNOSIS — E785 Hyperlipidemia, unspecified: Secondary | ICD-10-CM

## 2013-06-22 DIAGNOSIS — Z0000 Encounter for general adult medical examination without abnormal findings: Secondary | ICD-10-CM

## 2013-06-22 DIAGNOSIS — E559 Vitamin D deficiency, unspecified: Secondary | ICD-10-CM

## 2013-06-22 DIAGNOSIS — E1165 Type 2 diabetes mellitus with hyperglycemia: Secondary | ICD-10-CM

## 2013-06-22 DIAGNOSIS — E119 Type 2 diabetes mellitus without complications: Secondary | ICD-10-CM

## 2013-06-22 DIAGNOSIS — E2839 Other primary ovarian failure: Secondary | ICD-10-CM

## 2013-06-22 LAB — BASIC METABOLIC PANEL
BUN: 8 mg/dL (ref 6–23)
CO2: 27 mEq/L (ref 19–32)
Calcium: 9.2 mg/dL (ref 8.4–10.5)
Chloride: 103 mEq/L (ref 96–112)
Creatinine, Ser: 0.7 mg/dL (ref 0.4–1.2)
GFR: 85.82 mL/min (ref >60.00)
Glucose, Bld: 144 mg/dL — ABNORMAL HIGH (ref 70–99)
Potassium: 4.2 mEq/L (ref 3.5–5.1)
Sodium: 136 mEq/L (ref 135–145)

## 2013-06-22 LAB — HEPATIC FUNCTION PANEL
ALT: 46 U/L — ABNORMAL HIGH (ref 0–35)
AST: 40 U/L — ABNORMAL HIGH (ref 0–37)
Albumin: 4.2 g/dL (ref 3.5–5.2)
Alkaline Phosphatase: 51 U/L (ref 39–117)
Bilirubin, Direct: 0 mg/dL (ref 0.0–0.3)
Total Bilirubin: 0.5 mg/dL (ref 0.3–1.2)
Total Protein: 7.1 g/dL (ref 6.0–8.3)

## 2013-06-22 LAB — CBC WITH DIFFERENTIAL/PLATELET
Basophils Absolute: 0 10*3/uL (ref 0.0–0.1)
Basophils Relative: 0.6 % (ref 0.0–3.0)
Eosinophils Absolute: 0.2 10*3/uL (ref 0.0–0.7)
Eosinophils Relative: 2.8 % (ref 0.0–5.0)
HCT: 46.2 % — ABNORMAL HIGH (ref 36.0–46.0)
Hemoglobin: 15.6 g/dL — ABNORMAL HIGH (ref 12.0–15.0)
Lymphocytes Relative: 25.8 % (ref 12.0–46.0)
Lymphs Abs: 1.8 10*3/uL (ref 0.7–4.0)
MCHC: 33.8 g/dL (ref 30.0–36.0)
MCV: 94.1 fl (ref 78.0–100.0)
Monocytes Absolute: 0.5 10*3/uL (ref 0.1–1.0)
Monocytes Relative: 7.1 % (ref 3.0–12.0)
Neutro Abs: 4.5 10*3/uL (ref 1.4–7.7)
Neutrophils Relative %: 63.7 % (ref 43.0–77.0)
Platelets: 163 10*3/uL (ref 150.0–400.0)
RBC: 4.91 Mil/uL (ref 3.87–5.11)
RDW: 13.1 % (ref 11.5–14.6)
WBC: 7 10*3/uL (ref 4.5–10.5)

## 2013-06-22 LAB — LDL CHOLESTEROL, DIRECT: Direct LDL: 145.6 mg/dL

## 2013-06-22 LAB — HEMOGLOBIN A1C: Hgb A1c MFr Bld: 6.9 % — ABNORMAL HIGH (ref 4.6–6.5)

## 2013-06-22 LAB — TSH: TSH: 1.23 u[IU]/mL (ref 0.35–5.50)

## 2013-06-22 LAB — LIPID PANEL
Cholesterol: 202 mg/dL — ABNORMAL HIGH (ref 0–200)
HDL: 40.8 mg/dL (ref >39.00)
Total CHOL/HDL Ratio: 5
Triglycerides: 159 mg/dL — ABNORMAL HIGH (ref 0.0–149.0)
VLDL: 31.8 mg/dL (ref 0.0–40.0)

## 2013-06-22 NOTE — Patient Instructions (Addendum)
Follow up in 4 months to recheck diabetes Call your insurance company and ask about the shingles vaccine We'll notify you of your lab results and make any changes if needed Call with any questions or concerns Keep up the good work!  You look great! Happy September!!

## 2013-06-22 NOTE — Progress Notes (Signed)
  Subjective:    Patient ID: Michelle Long, female    DOB: May 25, 1946, 67 y.o.   MRN: [id number]  HPI Here today for CPE.  Risk Factors: Hyperlipidemia- chronic problem, is eating low fat diet.  Stopped the Zetia due to cost but not side effects.   DM- chronic problem, has been on Januvia but stopped this due to cost.  Stopped ~1 month ago.  Is exercising regularly, eating low carb diet.  UTD on eye exam.  No numbness/tingling hands/feet.  Physical Activity: exercising regularly Fall Risk: low risk Depression: well controlled, continues to see psych regularly Hearing: normal to conversational tones and whispered voice at 6 ft ADL's: independent Cognitive: normal linear thought process, memory and attention intact Home Safety: safe at home Height, Weight, BMI, Visual Acuity: see vitals, vision corrected to 20/20 w/ glasses Counseling: UTD on colonoscopy, [facility] ([facility]).  No need for paps.  Due for DEXA. Labs Ordered: See A&P Care Plan: See A&P    Review of Systems Patient reports no vision/ hearing changes, adenopathy,fever, weight change,  persistant/recurrent hoarseness , swallowing issues, chest pain, palpitations, edema, persistant/recurrent cough, hemoptysis, dyspnea (rest/exertional/paroxysmal nocturnal), gastrointestinal bleeding (melena, rectal bleeding), abdominal pain, significant heartburn, bowel changes, GU symptoms (dysuria, hematuria, incontinence), Gyn symptoms (abnormal  bleeding, pain),  syncope, focal weakness, memory loss, numbness & tingling, skin/hair/nail changes, abnormal bruising or bleeding, anxiety, or depression.      Objective:   Physical Exam General Appearance:    Alert, cooperative, no distress, appears stated age  Head:    Normocephalic, without obvious abnormality, atraumatic  Eyes:    PERRL, conjunctiva/corneas clear, EOM's intact, fundi    benign, both eyes  Ears:    Normal TM's and external ear canals, both ears  Nose:   Nares normal, septum  midline, mucosa normal, no drainage    or sinus tenderness  Throat:   Lips, mucosa, and tongue normal; teeth and gums normal  Neck:   Supple, symmetrical, trachea midline, no adenopathy;    Thyroid: no enlargement/tenderness/nodules  Back:     Symmetric, no curvature, ROM normal, no CVA tenderness  Lungs:     Clear to auscultation bilaterally, respirations unlabored  Chest Wall:    No tenderness or deformity   Heart:    Regular rate and rhythm, S1 and S2 normal, no murmur, rub   or gallop  Breast Exam:    Deferred to mammo  Abdomen:     Soft, non-tender, bowel sounds active all four quadrants,    no masses, no organomegaly  Genitalia:    Deferred at pt's request  Rectal:    Extremities:   Extremities normal, atraumatic, no cyanosis or edema  Pulses:   2+ and symmetric all extremities  Skin:   Skin color, texture, turgor normal, no rashes or lesions  Lymph nodes:   Cervical, supraclavicular, and axillary nodes normal  Neurologic:   CNII-XII intact, normal strength, sensation and reflexes    throughout          Assessment & Plan:

## 2013-06-26 LAB — VITAMIN D 1,25 DIHYDROXY
Vitamin D 1, 25 (OH)2 Total: 58 pg/mL (ref 18–72)
Vitamin D2 1, 25 (OH)2: <8 pg/mL
Vitamin D3 1, 25 (OH)2: 58 pg/mL

## 2013-06-27 NOTE — Assessment & Plan Note (Signed)
Chronic problem.  Statin intolerant.  Using Zetia w/out difficulty but struggling due to cost.  Samples provided.  Will follow.

## 2013-06-27 NOTE — Assessment & Plan Note (Signed)
Chronic problem.  Pt doing well on [place] w/ exception of cost which caused her to stop 1 month ago.  UTD on eye exam.  Foot exam done today.  Check labs.  Adjust meds prn

## 2013-06-27 NOTE — Assessment & Plan Note (Signed)
Pt's PE WNL.  UTD on mammo, colonoscopy.  No need for paps.  Due for DEXA- pt prefers to have this at time of next mammo.  Check labs.  Anticipatory guidance provided.

## 2013-06-27 NOTE — Assessment & Plan Note (Signed)
Check labs.  Replete prn. 

## 2013-06-29 ENCOUNTER — Telehealth: Payer: Self-pay | Admitting: Family Medicine

## 2013-06-29 NOTE — Telephone Encounter (Signed)
Spoke with pt gave her lab results and mailed a copy.

## 2013-06-29 NOTE — Telephone Encounter (Signed)
Patient is calling to request lab results. Says she was not able to see full results on MyChart. Please advise.

## 2013-07-15 ENCOUNTER — Other Ambulatory Visit: Payer: Self-pay | Admitting: Family Medicine

## 2013-08-03 ENCOUNTER — Other Ambulatory Visit: Payer: Self-pay | Admitting: General Practice

## 2013-08-03 MED ORDER — PANTOPRAZOLE SODIUM 40 MG PO TBEC: 40.0000 mg | DELAYED_RELEASE_TABLET | Freq: Every day | ORAL | Status: DC

## 2013-08-05 ENCOUNTER — Telehealth: Payer: Self-pay | Admitting: *Deleted

## 2013-08-05 NOTE — Telephone Encounter (Signed)
Pt called and stated that she would like samples of symbicort if they are available. Patient states that she only have a couple of days left. Please advise. SW

## 2013-08-05 NOTE — Telephone Encounter (Signed)
Pt notified and samples placed up front

## 2013-08-05 NOTE — Telephone Encounter (Signed)
Ok for samples if available 

## 2013-08-10 ENCOUNTER — Telehealth: Payer: Self-pay | Admitting: *Deleted

## 2013-08-10 ENCOUNTER — Other Ambulatory Visit: Payer: Self-pay | Admitting: Family Medicine

## 2013-08-10 DIAGNOSIS — E2839 Other primary ovarian failure: Secondary | ICD-10-CM

## 2013-08-10 NOTE — Telephone Encounter (Signed)
Patient would like to have her bone density schedule.

## 2013-08-10 NOTE — Telephone Encounter (Signed)
Order entered Sept 2014

## 2013-08-10 NOTE — Telephone Encounter (Signed)
No record of you speaking with pt in regards to this. Pt has vitamin d deficiency. Ok for order?

## 2013-08-10 NOTE — Telephone Encounter (Signed)
Please advise if you have spoken to the pt in regard to this?

## 2013-08-12 ENCOUNTER — Ambulatory Visit (INDEPENDENT_AMBULATORY_CARE_PROVIDER_SITE_OTHER): Payer: BC Managed Care – HMO | Admitting: General Practice

## 2013-08-12 DIAGNOSIS — Z23 Encounter for immunization: Secondary | ICD-10-CM

## 2013-08-18 ENCOUNTER — Telehealth: Payer: Self-pay | Admitting: *Deleted

## 2013-08-18 NOTE — Telephone Encounter (Signed)
Pt was called on 10/22.

## 2013-08-18 NOTE — Telephone Encounter (Deleted)
Called patient to to inform her that we have Crestor samples up front for her until her prior authorization is approved.

## 2013-08-18 NOTE — Telephone Encounter (Signed)
Error

## 2013-08-31 LAB — HM DIABETES EYE EXAM: HM Diabetic Eye Exam: NEGATIVE

## 2013-09-07 ENCOUNTER — Ambulatory Visit (INDEPENDENT_AMBULATORY_CARE_PROVIDER_SITE_OTHER): Payer: BC Managed Care – HMO | Admitting: *Deleted

## 2013-09-07 DIAGNOSIS — Z2911 Encounter for prophylactic immunotherapy for respiratory syncytial virus (RSV): Secondary | ICD-10-CM

## 2013-09-07 DIAGNOSIS — Z23 Encounter for immunization: Secondary | ICD-10-CM

## 2013-09-08 ENCOUNTER — Telehealth: Payer: Self-pay | Admitting: *Deleted

## 2013-09-08 NOTE — Telephone Encounter (Signed)
Patient is requesting samples of Januvia 100mg  due to being in the "doughnut hole and is unable to afford medications". Samples provided #8 Lot # [id number] Exp 11/2015

## 2013-10-05 ENCOUNTER — Encounter: Payer: Self-pay | Admitting: Family Medicine

## 2013-10-06 ENCOUNTER — Telehealth: Payer: Self-pay | Admitting: Family Medicine

## 2013-10-06 NOTE — Telephone Encounter (Signed)
Sample placed up front and pt notified.

## 2013-10-06 NOTE — Telephone Encounter (Signed)
Patient wants to know if we have any samples of Symbicort she could have. She does not have enough to last her until the 1st of the year.

## 2013-10-22 ENCOUNTER — Ambulatory Visit (INDEPENDENT_AMBULATORY_CARE_PROVIDER_SITE_OTHER): Payer: Medicare HMO | Admitting: Family Medicine

## 2013-10-22 ENCOUNTER — Encounter: Payer: Self-pay | Admitting: Family Medicine

## 2013-10-22 VITALS — BP 122/82 | HR 94 | Temp 97.9°F | Resp 16 | Wt 165.4 lb

## 2013-10-22 DIAGNOSIS — E785 Hyperlipidemia, unspecified: Secondary | ICD-10-CM

## 2013-10-22 DIAGNOSIS — E119 Type 2 diabetes mellitus without complications: Secondary | ICD-10-CM

## 2013-10-22 DIAGNOSIS — Z23 Encounter for immunization: Secondary | ICD-10-CM

## 2013-10-22 DIAGNOSIS — E1165 Type 2 diabetes mellitus with hyperglycemia: Secondary | ICD-10-CM

## 2013-10-22 DIAGNOSIS — E2839 Other primary ovarian failure: Secondary | ICD-10-CM | POA: Insufficient documentation

## 2013-10-22 LAB — MICROALBUMIN / CREATININE URINE RATIO
Creatinine,U: 140.9 mg/dL
Microalb Creat Ratio: 1.7 mg/g (ref 0.0–30.0)
Microalb, Ur: 2.4 mg/dL — ABNORMAL HIGH (ref 0.0–1.9)

## 2013-10-22 LAB — HEPATIC FUNCTION PANEL
ALT: 45 U/L — ABNORMAL HIGH (ref 0–35)
AST: 60 U/L — ABNORMAL HIGH (ref 0–37)
Albumin: 4 g/dL (ref 3.5–5.2)
Alkaline Phosphatase: 53 U/L (ref 39–117)
Bilirubin, Direct: 0.1 mg/dL (ref 0.0–0.3)
Total Bilirubin: 0.5 mg/dL (ref 0.3–1.2)
Total Protein: 6.6 g/dL (ref 6.0–8.3)

## 2013-10-22 LAB — BASIC METABOLIC PANEL
BUN: 11 mg/dL (ref 6–23)
CO2: 27 mEq/L (ref 19–32)
Calcium: 8.8 mg/dL (ref 8.4–10.5)
Chloride: 104 mEq/L (ref 96–112)
Creatinine, Ser: 0.7 mg/dL (ref 0.4–1.2)
GFR: 90.05 mL/min (ref >60.00)
Glucose, Bld: 164 mg/dL — ABNORMAL HIGH (ref 70–99)
Potassium: 4 mEq/L (ref 3.5–5.1)
Sodium: 140 mEq/L (ref 135–145)

## 2013-10-22 LAB — LIPID PANEL
Cholesterol: 199 mg/dL (ref 0–200)
HDL: 35.6 mg/dL — ABNORMAL LOW (ref >39.00)
LDL Cholesterol: 137 mg/dL — ABNORMAL HIGH (ref 0–99)
Total CHOL/HDL Ratio: 6
Triglycerides: 132 mg/dL (ref 0.0–149.0)
VLDL: 26.4 mg/dL (ref 0.0–40.0)

## 2013-10-22 NOTE — Assessment & Plan Note (Signed)
Chronic problem.  Tolerating the Januvia w/out difficulty.  Foot exam done.  UTD on eye exam.  Due for UA and microalbumin.  Check labs.  Adjust meds prn

## 2013-10-22 NOTE — Patient Instructions (Signed)
Follow up in 3-4 months to recheck sugars We'll notify you of your lab results and make any changes if needed We'll call you with your bone density appt Call with any questions or concerns Happy New Year!!!

## 2013-10-22 NOTE — Progress Notes (Signed)
Pre visit review using our clinic review tool, if applicable. No additional management support is needed unless otherwise documented below in the visit note. 

## 2013-10-22 NOTE — Assessment & Plan Note (Signed)
Pt due for DEXA- order entered

## 2013-10-22 NOTE — Progress Notes (Signed)
   Subjective:    Patient ID: Michelle Long, female    DOB: 1946/08/14, 68 y.o.   MRN: [id number]  HPI DM- chronic problem, not currently checking CBGs.  On Januvia.  No symptomatic lows.  No CP, SOB, HAs, visual changes, edema, N/V/D.  UTD on eye exam.  No numbness/tingling hands/feet.  Hyperlipidemia- chronic problem, currently off Zetia.  Due for labs.  Estrogen deficiency- pt due for DEXA  Review of Systems For ROS see HPI     Objective:   Physical Exam  Vitals reviewed. Constitutional: She is oriented to person, place, and time. She appears well-developed and well-nourished. No distress.  HENT:  Head: Normocephalic and atraumatic.  Eyes: Conjunctivae and EOM are normal. Pupils are equal, round, and reactive to light.  Neck: Normal range of motion. Neck supple. No thyromegaly present.  Cardiovascular: Normal rate, regular rhythm, normal heart sounds and intact distal pulses.   No murmur heard. Pulmonary/Chest: Effort normal and breath sounds normal. No respiratory distress.  Abdominal: Soft. She exhibits no distension. There is no tenderness.  Musculoskeletal: She exhibits no edema.  Lymphadenopathy:    She has no cervical adenopathy.  Neurological: She is alert and oriented to person, place, and time.  Skin: Skin is warm and dry.  Psychiatric: She has a normal mood and affect. Her behavior is normal.          Assessment & Plan:

## 2013-10-22 NOTE — Assessment & Plan Note (Signed)
Chronic problem.  Pt had wanted to hold Zetia (due to cost) and see if her lipids remained stable.  Due for labs today.  Will restart meds prn.

## 2013-11-04 ENCOUNTER — Ambulatory Visit (INDEPENDENT_AMBULATORY_CARE_PROVIDER_SITE_OTHER)
Admission: RE | Admit: 2013-11-04 | Discharge: 2013-11-04 | Disposition: A | Discharge status: Home / Self Care | Payer: Medicare HMO | Source: Ambulatory Visit | Attending: Family Medicine | Admitting: Family Medicine

## 2013-11-04 DIAGNOSIS — E2839 Other primary ovarian failure: Secondary | ICD-10-CM

## 2013-11-05 ENCOUNTER — Telehealth: Payer: Self-pay | Admitting: *Deleted

## 2013-11-05 NOTE — Telephone Encounter (Signed)
Patient called and stated that she has an apt with dr Burney Gauze February 5th also has an apt with dr Reece Levy @ [facility] March 4th. Patient needs referrals.

## 2013-11-09 ENCOUNTER — Encounter: Payer: Self-pay | Admitting: Family Medicine

## 2013-11-09 DIAGNOSIS — J441 Chronic obstructive pulmonary disease with (acute) exacerbation: Secondary | ICD-10-CM

## 2013-11-10 MED ORDER — SITAGLIPTIN PHOSPHATE 100 MG PO TABS: 100.0000 mg | ORAL_TABLET | Freq: Every day | ORAL | Status: DC

## 2013-11-10 MED ORDER — ONETOUCH DELICA LANCETS FINE MISC: 1.0000 | Freq: Every day | Status: DC

## 2013-11-10 MED ORDER — ALBUTEROL SULFATE HFA 108 (90 BASE) MCG/ACT IN AERS: 2.0000 | INHALATION_SPRAY | Freq: Four times a day (QID) | RESPIRATORY_TRACT | Status: DC | PRN

## 2013-11-10 MED ORDER — TAMOXIFEN CITRATE 20 MG PO TABS: 20.0000 mg | ORAL_TABLET | Freq: Every day | ORAL | Status: DC

## 2013-11-10 MED ORDER — PANTOPRAZOLE SODIUM 40 MG PO TBEC: 40.0000 mg | DELAYED_RELEASE_TABLET | Freq: Every day | ORAL | Status: DC

## 2013-11-10 MED ORDER — BUDESONIDE-FORMOTEROL FUMARATE 160-4.5 MCG/ACT IN AERO: INHALATION_SPRAY | RESPIRATORY_TRACT | Status: DC

## 2013-11-10 MED ORDER — LORAZEPAM 0.5 MG PO TABS: 0.5000 mg | ORAL_TABLET | Freq: Two times a day (BID) | ORAL | Status: DC | PRN

## 2013-11-10 MED ORDER — OLANZAPINE 10 MG PO TABS: 5.0000 mg | ORAL_TABLET | Freq: Every day | ORAL | Status: DC

## 2013-11-10 MED ORDER — EZETIMIBE 10 MG PO TABS: 10.0000 mg | ORAL_TABLET | Freq: Every day | ORAL | Status: DC

## 2013-11-10 MED ORDER — CITALOPRAM HYDROBROMIDE 10 MG PO TABS: 5.0000 mg | ORAL_TABLET | Freq: Every day | ORAL | Status: DC

## 2013-11-10 MED ORDER — GLUCOSE BLOOD VI STRP: ORAL_STRIP | Status: DC

## 2013-11-10 NOTE — Telephone Encounter (Signed)
Ok for #90, no refills 

## 2013-11-10 NOTE — Telephone Encounter (Signed)
Pt wants to have a 90 day supply sent to [facility]. Please advise on this med.

## 2013-11-10 NOTE — Telephone Encounter (Signed)
Med filled.  

## 2013-11-11 ENCOUNTER — Other Ambulatory Visit: Payer: Self-pay | Admitting: General Practice

## 2013-11-11 ENCOUNTER — Encounter: Payer: Self-pay | Admitting: Family Medicine

## 2013-11-11 DIAGNOSIS — J441 Chronic obstructive pulmonary disease with (acute) exacerbation: Secondary | ICD-10-CM

## 2013-11-11 MED ORDER — LOSARTAN POTASSIUM 50 MG PO TABS: 50.0000 mg | ORAL_TABLET | Freq: Every day | ORAL | Status: DC

## 2013-11-11 NOTE — Telephone Encounter (Signed)
Referrals faxed. 

## 2013-11-12 MED ORDER — SITAGLIPTIN PHOSPHATE 100 MG PO TABS: 100.0000 mg | ORAL_TABLET | Freq: Every day | ORAL | Status: DC

## 2013-11-12 NOTE — Telephone Encounter (Signed)
Med filled locally for 30days per pt request.

## 2013-11-15 ENCOUNTER — Telehealth: Payer: Self-pay | Admitting: Hematology & Oncology

## 2013-11-15 NOTE — Telephone Encounter (Signed)
[employer] Referral Approved for new pt visit/consult.  Brien Few: [id number] Requesting Provider: Dr. Annye Asa Treating Provider: Dr. Burney Gauze Visits: 4 Status: Approved Dates: 11/25/2013-02/22/2014  COPY SCANNED

## 2013-11-16 ENCOUNTER — Telehealth: Payer: Self-pay | Admitting: Family Medicine

## 2013-11-16 NOTE — Telephone Encounter (Signed)
Relevant patient education assigned to patient using Emmi. ° °

## 2013-11-18 ENCOUNTER — Other Ambulatory Visit (INDEPENDENT_AMBULATORY_CARE_PROVIDER_SITE_OTHER): Payer: Medicare HMO

## 2013-11-18 DIAGNOSIS — E785 Hyperlipidemia, unspecified: Secondary | ICD-10-CM

## 2013-11-18 DIAGNOSIS — C50912 Malignant neoplasm of unspecified site of left female breast: Secondary | ICD-10-CM

## 2013-11-18 DIAGNOSIS — E119 Type 2 diabetes mellitus without complications: Secondary | ICD-10-CM

## 2013-11-18 LAB — HEPATIC FUNCTION PANEL
ALT: 54 U/L — ABNORMAL HIGH (ref 0–35)
AST: 67 U/L — ABNORMAL HIGH (ref 0–37)
Albumin: 4 g/dL (ref 3.5–5.2)
Alkaline Phosphatase: 59 U/L (ref 39–117)
Bilirubin, Direct: 0 mg/dL (ref 0.0–0.3)
Total Bilirubin: 0.6 mg/dL (ref 0.3–1.2)
Total Protein: 6.9 g/dL (ref 6.0–8.3)

## 2013-11-18 LAB — HEMOGLOBIN A1C: Hgb A1c MFr Bld: 8 % — ABNORMAL HIGH (ref 4.6–6.5)

## 2013-11-19 ENCOUNTER — Other Ambulatory Visit: Payer: Self-pay | Admitting: Family Medicine

## 2013-11-19 ENCOUNTER — Other Ambulatory Visit: Payer: Self-pay | Admitting: General Practice

## 2013-11-19 DIAGNOSIS — R7989 Other specified abnormal findings of blood chemistry: Secondary | ICD-10-CM

## 2013-11-19 DIAGNOSIS — R945 Abnormal results of liver function studies: Principal | ICD-10-CM

## 2013-11-19 MED ORDER — METFORMIN HCL 500 MG PO TABS: 500.0000 mg | ORAL_TABLET | Freq: Two times a day (BID) | ORAL | Status: DC

## 2013-11-22 ENCOUNTER — Telehealth: Payer: Self-pay | Admitting: Hematology & Oncology

## 2013-11-22 NOTE — Telephone Encounter (Signed)
Patient called and cx 11/25/13 apt and resch for 12/08/13

## 2013-11-25 ENCOUNTER — Ambulatory Visit: Payer: BC Managed Care – HMO | Admitting: Hematology & Oncology

## 2013-11-25 ENCOUNTER — Other Ambulatory Visit: Payer: BC Managed Care – HMO | Admitting: Lab

## 2013-12-07 ENCOUNTER — Other Ambulatory Visit: Payer: Self-pay | Admitting: Nurse Practitioner

## 2013-12-07 DIAGNOSIS — C679 Malignant neoplasm of bladder, unspecified: Secondary | ICD-10-CM

## 2013-12-08 ENCOUNTER — Encounter: Payer: Self-pay | Admitting: Hematology & Oncology

## 2013-12-08 ENCOUNTER — Other Ambulatory Visit (HOSPITAL_BASED_OUTPATIENT_CLINIC_OR_DEPARTMENT_OTHER): Payer: Medicare HMO | Admitting: Lab

## 2013-12-08 ENCOUNTER — Ambulatory Visit: Payer: Self-pay | Admitting: Hematology & Oncology

## 2013-12-08 ENCOUNTER — Other Ambulatory Visit: Payer: Medicare HMO | Admitting: Lab

## 2013-12-08 ENCOUNTER — Ambulatory Visit (HOSPITAL_BASED_OUTPATIENT_CLINIC_OR_DEPARTMENT_OTHER): Payer: Commercial Managed Care - HMO | Admitting: Hematology & Oncology

## 2013-12-08 VITALS — BP 134/66 | HR 90 | Temp 97.9°F | Resp 14 | Ht 64.0 in | Wt 164.0 lb

## 2013-12-08 DIAGNOSIS — E559 Vitamin D deficiency, unspecified: Secondary | ICD-10-CM

## 2013-12-08 DIAGNOSIS — C50319 Malignant neoplasm of lower-inner quadrant of unspecified female breast: Secondary | ICD-10-CM

## 2013-12-08 DIAGNOSIS — C679 Malignant neoplasm of bladder, unspecified: Secondary | ICD-10-CM

## 2013-12-08 DIAGNOSIS — C50919 Malignant neoplasm of unspecified site of unspecified female breast: Secondary | ICD-10-CM

## 2013-12-08 DIAGNOSIS — C50312 Malignant neoplasm of lower-inner quadrant of left female breast: Secondary | ICD-10-CM

## 2013-12-08 HISTORY — DX: Malignant neoplasm of lower-inner quadrant of left female breast: C50.312

## 2013-12-08 LAB — CBC WITH DIFFERENTIAL (CANCER CENTER ONLY)
BASO#: 0 10*3/uL (ref 0.0–0.2)
BASO%: 0.3 % (ref 0.0–2.0)
EOS%: 2.7 % (ref 0.0–7.0)
Eosinophils Absolute: 0.2 10*3/uL (ref 0.0–0.5)
HCT: 43.6 % (ref 34.8–46.6)
HGB: 14.2 g/dL (ref 11.6–15.9)
LYMPH#: 1.5 10*3/uL (ref 0.9–3.3)
LYMPH%: 22.4 % (ref 14.0–48.0)
MCH: 31.1 pg (ref 26.0–34.0)
MCHC: 32.6 g/dL (ref 32.0–36.0)
MCV: 95 fL (ref 81–101)
MONO#: 0.5 10*3/uL (ref 0.1–0.9)
MONO%: 7.5 % (ref 0.0–13.0)
NEUT#: 4.6 10*3/uL (ref 1.5–6.5)
NEUT%: 67.1 % (ref 39.6–80.0)
Platelets: 139 10*3/uL — ABNORMAL LOW (ref 145–400)
RBC: 4.57 10*6/uL (ref 3.70–5.32)
RDW: 12.4 % (ref 11.1–15.7)
WBC: 6.8 10*3/uL (ref 3.9–10.0)

## 2013-12-08 LAB — COMPREHENSIVE METABOLIC PANEL
ALT: 43 U/L — ABNORMAL HIGH (ref 0–35)
AST: 41 U/L — ABNORMAL HIGH (ref 0–37)
Albumin: 4.2 g/dL (ref 3.5–5.2)
Alkaline Phosphatase: 52 U/L (ref 39–117)
BUN: 8 mg/dL (ref 6–23)
CO2: 24 mEq/L (ref 19–32)
Calcium: 9 mg/dL (ref 8.4–10.5)
Chloride: 104 mEq/L (ref 96–112)
Creatinine, Ser: 0.72 mg/dL (ref 0.50–1.10)
Glucose, Bld: 211 mg/dL — ABNORMAL HIGH (ref 70–99)
Potassium: 4.3 mEq/L (ref 3.5–5.3)
Sodium: 138 mEq/L (ref 135–145)
Total Bilirubin: 0.4 mg/dL (ref 0.2–1.2)
Total Protein: 6.4 g/dL (ref 6.0–8.3)

## 2013-12-08 NOTE — Progress Notes (Signed)
Hematology and Oncology Follow Up Visit  Michelle Long [id number] [birth year] 68 y.o. 12/08/2013   Principle Diagnosis:   Stage IIA (T2 N0M0) infiltrating ductal carcinoma of the left breast  Current Therapy:    Tamoxifen 20 mg by mouth daily     Interim History:  [name] is in for followup. We see her every 6 months. She is a little worried as her family doctor is going to send her to a gastroenterologist for elevated liver test. I suspect that she, in that it has anything, has a fatty liver. She is diabetic.  As far as her breast cancer is concerned, she is doing well with this. I think in about 8 years. She's had no bony pain. She's had no nausea vomiting. There's been no change in bowel bladder habits. She's had no issues with the flu this year  Her next a mammogram will be due in July..  Medications: Current outpatient prescriptions:albuterol (PROVENTIL HFA;VENTOLIN HFA) 108 (90 BASE) MCG/ACT inhaler, Inhale 2 puffs into the lungs every 6 (six) hours as needed for wheezing., Disp: 3 Inhaler, Rfl: 1;  aspirin 81 MG tablet, Take 81 mg by mouth daily., Disp: , Rfl: ;  budesonide-formoterol (SYMBICORT) 160-4.5 MCG/ACT inhaler, USE 2 PUFFS INTO LUNGS TWICE A DAY, Disp: 3 Inhaler, Rfl: 1 Cholecalciferol (VITAMIN D) 1000 UNITS capsule, Take 3,000 Units by mouth daily. , Disp: , Rfl: ;  citalopram (CELEXA) 10 MG tablet, Take 0.5 tablets (5 mg total) by mouth at bedtime. Taking 5 mg. Only., Disp: 90 tablet, Rfl: 1;  fish oil-omega-3 fatty acids 1000 MG capsule, Take 1 g by mouth daily.  , Disp: , Rfl: ;  glucose blood test strip, Test Blood Sugar once a day. Dx 250.00, Disp: 100 each, Rfl: 5 LORazepam (ATIVAN) 0.5 MG tablet, Take 1 tablet (0.5 mg total) by mouth 2 (two) times daily as needed., Disp: 90 tablet, Rfl: 0;  losartan (COZAAR) 50 MG tablet, Take 1 tablet (50 mg total) by mouth daily., Disp: 90 tablet, Rfl: 3;  metFORMIN (GLUCOPHAGE) 500 MG tablet, Take 1 tablet (500 mg total) by mouth 2  (two) times daily with a meal., Disp: 180 tablet, Rfl: 1 OLANZapine (ZYPREXA) 10 MG tablet, Take 0.5 tablets (5 mg total) by mouth daily., Disp: 90 tablet, Rfl: 1;  ONETOUCH DELICA LANCETS FINE MISC, 1 each by Does not apply route daily. Test Blood sugar once a day. Dx 250.00, Disp: 100 each, Rfl: 5;  pantoprazole (PROTONIX) 40 MG tablet, Take 1 tablet (40 mg total) by mouth daily., Disp: 90 tablet, Rfl: 1 tamoxifen (NOLVADEX) 20 MG tablet, Take 1 tablet (20 mg total) by mouth daily., Disp: 90 tablet, Rfl: 1  Allergies:  Allergies  Allergen Reactions  . Lamictal [Lamotrigine]     itch  . Prozac [Fluoxetine Hcl]     seizure  . Sulfa Drugs Cross Reactors     Dyspnea     Past Medical History, Surgical history, Social history, and Family History were reviewed and updated.  Review of Systems: As above  Physical Exam:  height is 5\' 4"  (1.626 m) and weight is 164 lb (74.39 kg). Her oral temperature is 97.9 F (36.6 C). Her blood pressure is 134/66 and her pulse is 90. Her respiration is 14.   Well-developed well-nourished white female. She is no ocular or oral lesions. There is no palpable cervical or supraclavicular lymph nodes. Lungs are clear folic. There are no rales wheezes or rhonchi. Cardiac exam regular rate and rhythm  with no murmurs rubs or bruits. Breast exam shows right breast no masses edema or erythema. There is no right axillary adenopathy. Left breast shows a well-healed lumpectomy at about the 6:00 position. There is some contraction of the left breast. There's no mass. There is no left axillary adenopathy. Abdomen is soft. She has good bowel sounds. There is no fluid wave. There is no palpable hepato- splenomegaly back exam no tenderness over the spine ribs or hips. She has no kyphosis. Extremities shows some osteoarthritic changes. Has good strength. Skin exam no rashes. Neurological exam no focal neurological deficits.  Lab Results  Component Value Date   WBC 6.8 12/08/2013    HGB 14.2 12/08/2013   HCT 43.6 12/08/2013   MCV 95 12/08/2013   PLT 139* 12/08/2013     Chemistry      Component Value Date/Time   NA 140 10/22/2013 1140   K 4.0 10/22/2013 1140   CL 104 10/22/2013 1140   CO2 27 10/22/2013 1140   BUN 11 10/22/2013 1140   CREATININE 0.7 10/22/2013 1140      Component Value Date/Time   CALCIUM 8.8 10/22/2013 1140   ALKPHOS 59 11/18/2013 1332   AST 67* 11/18/2013 1332   ALT 54* 11/18/2013 1332   BILITOT 0.6 11/18/2013 1332         Impression and Plan: Michelle Long is a 68 year old postmenopausal female. She has stage XXVIII ductal carcinoma left breast. Her breast cancer is triple positive. She's on tamoxifen now. I plan for a total of 10 years of tamoxifen.  I tried to reassure her about her liver. I explained to her what I thought that she may have. I told her that I did not think that she was ever going to have an issue.  We will go ahead and plan for another six-month followup.    Volanda Napoleon, MD 2/18/20151:33 PM

## 2013-12-23 ENCOUNTER — Other Ambulatory Visit: Payer: Self-pay | Admitting: Family Medicine

## 2013-12-23 NOTE — Telephone Encounter (Signed)
Med filled.  

## 2014-01-18 ENCOUNTER — Encounter: Payer: Self-pay | Admitting: General Practice

## 2014-01-28 ENCOUNTER — Encounter: Payer: Self-pay | Admitting: Family Medicine

## 2014-01-28 ENCOUNTER — Encounter: Payer: Self-pay | Admitting: Lab

## 2014-01-28 ENCOUNTER — Ambulatory Visit (INDEPENDENT_AMBULATORY_CARE_PROVIDER_SITE_OTHER): Payer: Commercial Managed Care - HMO | Admitting: Family Medicine

## 2014-01-28 VITALS — BP 134/82 | HR 96 | Temp 98.2°F | Resp 12 | Wt 161.0 lb

## 2014-01-28 DIAGNOSIS — E119 Type 2 diabetes mellitus without complications: Secondary | ICD-10-CM

## 2014-01-28 DIAGNOSIS — E1165 Type 2 diabetes mellitus with hyperglycemia: Secondary | ICD-10-CM

## 2014-01-28 LAB — BASIC METABOLIC PANEL
BUN: 11 mg/dL (ref 6–23)
CO2: 25 mEq/L (ref 19–32)
Calcium: 9.4 mg/dL (ref 8.4–10.5)
Chloride: 105 mEq/L (ref 96–112)
Creatinine, Ser: 0.7 mg/dL (ref 0.4–1.2)
GFR: 89.98 mL/min (ref >60.00)
Glucose, Bld: 114 mg/dL — ABNORMAL HIGH (ref 70–99)
Potassium: 3.9 mEq/L (ref 3.5–5.1)
Sodium: 140 mEq/L (ref 135–145)

## 2014-01-28 LAB — HEMOGLOBIN A1C: Hgb A1c MFr Bld: 7.3 % — ABNORMAL HIGH (ref 4.6–6.5)

## 2014-01-28 NOTE — Patient Instructions (Signed)
Follow up in 4 months to recheck diabetes and cholesterol We'll notify you of your lab results and make any changes if needed Keep up the good work!  You look great! Call with any questions or concerns Happy Spring!!!

## 2014-01-28 NOTE — Progress Notes (Signed)
Pre visit review using our clinic review tool, if applicable. No additional management support is needed unless otherwise documented below in the visit note. 

## 2014-01-28 NOTE — Progress Notes (Signed)
   Subjective:    Patient ID: Michelle Long, female    DOB: 12-24-1945, 68 y.o.   MRN: [id number]  Diabetes   DM- chronic problem, on Januvia and Metformin.  On Losartan for renal protection.  CBGs are <130 in the AM.  Denies symptomatic lows.  No CP, SOB, HAs, visual changes, N/V/D, edema, numbness/tingling of hands/feet.  UTD on eye exam.   Review of Systems For ROS see HPI     Objective:   Physical Exam  Vitals reviewed. Constitutional: She is oriented to person, place, and time. She appears well-developed and well-nourished. No distress.  HENT:  Head: Normocephalic and atraumatic.  Eyes: Conjunctivae and EOM are normal. Pupils are equal, round, and reactive to light.  Neck: Normal range of motion. Neck supple. No thyromegaly present.  Cardiovascular: Normal rate, regular rhythm, normal heart sounds and intact distal pulses.   No murmur heard. Pulmonary/Chest: Effort normal and breath sounds normal. No respiratory distress.  Abdominal: Soft. She exhibits no distension. There is no tenderness.  Musculoskeletal: She exhibits no edema.  Lymphadenopathy:    She has no cervical adenopathy.  Neurological: She is alert and oriented to person, place, and time.  Skin: Skin is warm and dry.  Psychiatric: She has a normal mood and affect. Her behavior is normal.          Assessment & Plan:

## 2014-01-28 NOTE — Assessment & Plan Note (Signed)
Chronic problem.  Pt has recommitted to healthy diet and regular exercise.  Is UTD on eye exam.  Foot exam done today.  On ARB for renal protection.  Asymptomatic.  Check labs.  Adjust meds prn

## 2014-02-14 ENCOUNTER — Telehealth: Payer: Self-pay

## 2014-02-14 NOTE — Telephone Encounter (Signed)
Relevant patient education assigned to patient using Emmi. ° °

## 2014-03-01 ENCOUNTER — Telehealth: Payer: Self-pay | Admitting: Family Medicine

## 2014-03-01 NOTE — Telephone Encounter (Addendum)
[name] name:Michelle Long Relation to RN:HAFBXUX Call back number: [phone] Pharmacy:  Reason for call: to request a referral to [facility]. Patient states that it is time for her check up and to get glasses.   [employer] id# [id number]

## 2014-03-04 NOTE — Telephone Encounter (Signed)
Patient states this is a routine eye exam for her glasses. [employer] does not require a referral for routine eye exams. Patient made aware.

## 2014-03-11 ENCOUNTER — Encounter: Payer: Self-pay | Admitting: Family Medicine

## 2014-03-24 ENCOUNTER — Other Ambulatory Visit: Payer: Self-pay | Admitting: Hematology & Oncology

## 2014-04-15 ENCOUNTER — Other Ambulatory Visit: Payer: Self-pay

## 2014-04-15 DIAGNOSIS — Z1231 Encounter for screening mammogram for malignant neoplasm of breast: Secondary | ICD-10-CM

## 2014-04-15 DIAGNOSIS — Z853 Personal history of malignant neoplasm of breast: Secondary | ICD-10-CM

## 2014-04-18 ENCOUNTER — Other Ambulatory Visit: Payer: Self-pay | Admitting: General Practice

## 2014-04-18 ENCOUNTER — Telehealth: Payer: Self-pay | Admitting: Family Medicine

## 2014-04-18 DIAGNOSIS — J441 Chronic obstructive pulmonary disease with (acute) exacerbation: Secondary | ICD-10-CM

## 2014-04-18 MED ORDER — GLUCOSE BLOOD VI STRP: ORAL_STRIP | Status: DC

## 2014-04-18 MED ORDER — ALBUTEROL SULFATE HFA 108 (90 BASE) MCG/ACT IN AERS: 2.0000 | INHALATION_SPRAY | Freq: Four times a day (QID) | RESPIRATORY_TRACT | Status: DC | PRN

## 2014-04-18 NOTE — Telephone Encounter (Signed)
[name] name: Anelis Hrivnak Witcher Relation to pt: Patient Call back number: [phone] Pharmacy: [facility] on [address] in [facility]  Reason for call: called to request a refill for albuterol (PROVENTIL HFA;VENTOLIN HFA) 108 (90 BASE) MCG/ACT inhaler and one touch ultra 50 test strips. Also patient switched pharmacies to [facility] [address] in [place].

## 2014-04-18 NOTE — Telephone Encounter (Signed)
Medication filled will notify pt.  

## 2014-04-19 ENCOUNTER — Telehealth: Payer: Self-pay | Admitting: General Practice

## 2014-04-19 MED ORDER — ALBUTEROL SULFATE HFA 108 (90 BASE) MCG/ACT IN AERS: 2.0000 | INHALATION_SPRAY | Freq: Four times a day (QID) | RESPIRATORY_TRACT | Status: DC | PRN

## 2014-04-19 NOTE — Telephone Encounter (Signed)
Med changed, new rx sent.

## 2014-04-26 ENCOUNTER — Other Ambulatory Visit: Payer: Self-pay | Admitting: Family Medicine

## 2014-04-26 NOTE — Telephone Encounter (Signed)
Med filled.  

## 2014-05-24 ENCOUNTER — Ambulatory Visit: Payer: Commercial Managed Care - HMO

## 2014-05-31 ENCOUNTER — Ambulatory Visit (INDEPENDENT_AMBULATORY_CARE_PROVIDER_SITE_OTHER): Payer: Commercial Managed Care - HMO | Admitting: Family Medicine

## 2014-05-31 ENCOUNTER — Encounter: Payer: Self-pay | Admitting: Family Medicine

## 2014-05-31 VITALS — BP 128/84 | HR 99 | Temp 98.7°F | Resp 16 | Wt 158.5 lb

## 2014-05-31 DIAGNOSIS — E1165 Type 2 diabetes mellitus with hyperglycemia: Secondary | ICD-10-CM

## 2014-05-31 DIAGNOSIS — E119 Type 2 diabetes mellitus without complications: Secondary | ICD-10-CM

## 2014-05-31 DIAGNOSIS — E785 Hyperlipidemia, unspecified: Secondary | ICD-10-CM

## 2014-05-31 LAB — CBC WITH DIFFERENTIAL/PLATELET
Basophils Absolute: 0 10*3/uL (ref 0.0–0.1)
Basophils Relative: 0.4 % (ref 0.0–3.0)
Eosinophils Absolute: 0.1 10*3/uL (ref 0.0–0.7)
Eosinophils Relative: 2.3 % (ref 0.0–5.0)
HCT: 44 % (ref 36.0–46.0)
Hemoglobin: 14.4 g/dL (ref 12.0–15.0)
Lymphocytes Relative: 28 % (ref 12.0–46.0)
Lymphs Abs: 1.5 10*3/uL (ref 0.7–4.0)
MCHC: 32.7 g/dL (ref 30.0–36.0)
MCV: 96.8 fl (ref 78.0–100.0)
Monocytes Absolute: 0.4 10*3/uL (ref 0.1–1.0)
Monocytes Relative: 7.6 % (ref 3.0–12.0)
Neutro Abs: 3.4 10*3/uL (ref 1.4–7.7)
Neutrophils Relative %: 61.7 % (ref 43.0–77.0)
Platelets: 159 10*3/uL (ref 150.0–400.0)
RBC: 4.54 Mil/uL (ref 3.87–5.11)
RDW: 13 % (ref 11.5–15.5)
WBC: 5.4 10*3/uL (ref 4.0–10.5)

## 2014-05-31 LAB — HEPATIC FUNCTION PANEL
ALT: 55 U/L — ABNORMAL HIGH (ref 0–35)
AST: 67 U/L — ABNORMAL HIGH (ref 0–37)
Albumin: 4.1 g/dL (ref 3.5–5.2)
Alkaline Phosphatase: 46 U/L (ref 39–117)
Bilirubin, Direct: 0.1 mg/dL (ref 0.0–0.3)
Total Bilirubin: 0.7 mg/dL (ref 0.2–1.2)
Total Protein: 7.1 g/dL (ref 6.0–8.3)

## 2014-05-31 LAB — LIPID PANEL
Cholesterol: 198 mg/dL (ref 0–200)
HDL: 40.8 mg/dL (ref >39.00)
LDL Cholesterol: 134 mg/dL — ABNORMAL HIGH (ref 0–99)
NonHDL: 157.2
Total CHOL/HDL Ratio: 5
Triglycerides: 116 mg/dL (ref 0.0–149.0)
VLDL: 23.2 mg/dL (ref 0.0–40.0)

## 2014-05-31 LAB — BASIC METABOLIC PANEL WITH GFR
BUN: 11 mg/dL (ref 6–23)
CO2: 23 meq/L (ref 19–32)
Calcium: 9.2 mg/dL (ref 8.4–10.5)
Chloride: 105 meq/L (ref 96–112)
Creatinine, Ser: 0.8 mg/dL (ref 0.4–1.2)
GFR: 79.2 mL/min
Glucose, Bld: 131 mg/dL — ABNORMAL HIGH (ref 70–99)
Potassium: 4.1 meq/L (ref 3.5–5.1)
Sodium: 140 meq/L (ref 135–145)

## 2014-05-31 LAB — HEMOGLOBIN A1C: Hgb A1c MFr Bld: 6.9 % — ABNORMAL HIGH (ref 4.6–6.5)

## 2014-05-31 MED ORDER — OLANZAPINE 10 MG PO TABS: 10.0000 mg | ORAL_TABLET | Freq: Every day | ORAL | Status: DC

## 2014-05-31 NOTE — Progress Notes (Signed)
   Subjective:    Patient ID: Michelle Long, female    DOB: 08/28/1946, 68 y.o.   MRN: [id number]  HPI DM- chronic problem, on Metformin.  On ARB for renal protection.  Denies CP, SOB, HAs, visual changes, edema.  UTD on eye exam.  Denies symptomatic lows.  No numbness/tingling hands/ feet.  Hyperlipidemia- chronic problem, on fish oil daily.  Exercising regularly.  Has lost 3 lbs since last visit.  No N/V/D   Review of Systems For ROS see HPI     Objective:   Physical Exam  Vitals reviewed. Constitutional: She is oriented to person, place, and time. She appears well-developed and well-nourished. No distress.  HENT:  Head: Normocephalic and atraumatic.  Eyes: Conjunctivae and EOM are normal. Pupils are equal, round, and reactive to light.  Neck: Normal range of motion. Neck supple. No thyromegaly present.  Cardiovascular: Normal rate, regular rhythm, normal heart sounds and intact distal pulses.   No murmur heard. Pulmonary/Chest: Effort normal and breath sounds normal. No respiratory distress.  Abdominal: Soft. She exhibits no distension. There is no tenderness.  Musculoskeletal: She exhibits no edema.  Lymphadenopathy:    She has no cervical adenopathy.  Neurological: She is alert and oriented to person, place, and time.  Skin: Skin is warm and dry.  Psychiatric: She has a normal mood and affect. Her behavior is normal.          Assessment & Plan:

## 2014-05-31 NOTE — Assessment & Plan Note (Signed)
Ongoing issue.  Pt attempting to control w/ healthy diet and regular exercise.  Intolerant to statins.  Check labs.  Start Zetia prn.

## 2014-05-31 NOTE — Assessment & Plan Note (Signed)
Chronic problem.  Pt on ARB for renal protection, asymptomatic, and tolerating metformin w/o difficulty.  UTD on eye exam.  Check labs.  Adjust meds prn

## 2014-05-31 NOTE — Progress Notes (Signed)
Pre visit review using our clinic review tool, if applicable. No additional management support is needed unless otherwise documented below in the visit note. 

## 2014-05-31 NOTE — Patient Instructions (Signed)
Schedule your complete physical in 3-4 months We'll notify you of your lab results and make any changes if needed Keep up the good work on healthy diet and regular exercise Call with any questions or concerns Enjoy the rest of your summer!!!

## 2014-06-01 ENCOUNTER — Encounter: Payer: Self-pay | Admitting: General Practice

## 2014-06-01 ENCOUNTER — Encounter: Payer: Self-pay | Admitting: Family Medicine

## 2014-06-01 ENCOUNTER — Other Ambulatory Visit: Payer: Self-pay | Admitting: Family Medicine

## 2014-06-01 DIAGNOSIS — R7989 Other specified abnormal findings of blood chemistry: Secondary | ICD-10-CM

## 2014-06-01 DIAGNOSIS — R945 Abnormal results of liver function studies: Secondary | ICD-10-CM

## 2014-06-06 ENCOUNTER — Ambulatory Visit
Admission: RE | Admit: 2014-06-06 | Discharge: 2014-06-06 | Disposition: A | Discharge status: Home / Self Care | Payer: Commercial Managed Care - HMO | Source: Ambulatory Visit

## 2014-06-06 ENCOUNTER — Telehealth: Payer: Self-pay | Admitting: Hematology & Oncology

## 2014-06-06 DIAGNOSIS — Z853 Personal history of malignant neoplasm of breast: Secondary | ICD-10-CM

## 2014-06-06 DIAGNOSIS — Z1231 Encounter for screening mammogram for malignant neoplasm of breast: Secondary | ICD-10-CM

## 2014-06-06 NOTE — Telephone Encounter (Signed)
Pt cx 8-19 will call back to reschedule

## 2014-06-08 ENCOUNTER — Other Ambulatory Visit: Payer: Medicare HMO | Admitting: Lab

## 2014-06-08 ENCOUNTER — Ambulatory Visit: Payer: Medicare HMO | Admitting: Hematology & Oncology

## 2014-06-16 ENCOUNTER — Encounter: Payer: Self-pay | Admitting: Family Medicine

## 2014-06-17 ENCOUNTER — Encounter: Payer: Self-pay | Admitting: General Practice

## 2014-06-17 ENCOUNTER — Other Ambulatory Visit: Payer: Self-pay | Admitting: General Practice

## 2014-07-08 ENCOUNTER — Other Ambulatory Visit: Payer: Self-pay | Admitting: Family Medicine

## 2014-07-08 MED ORDER — METFORMIN HCL 500 MG PO TABS: 500.0000 mg | ORAL_TABLET | Freq: Two times a day (BID) | ORAL | Status: DC

## 2014-07-11 ENCOUNTER — Other Ambulatory Visit: Payer: Self-pay | Admitting: Hematology & Oncology

## 2014-07-13 LAB — HM DIABETES EYE EXAM

## 2014-07-22 ENCOUNTER — Other Ambulatory Visit (HOSPITAL_BASED_OUTPATIENT_CLINIC_OR_DEPARTMENT_OTHER): Payer: Commercial Managed Care - HMO | Admitting: Lab

## 2014-07-22 ENCOUNTER — Ambulatory Visit (HOSPITAL_BASED_OUTPATIENT_CLINIC_OR_DEPARTMENT_OTHER): Payer: Commercial Managed Care - HMO

## 2014-07-22 ENCOUNTER — Encounter: Payer: Self-pay | Admitting: Hematology & Oncology

## 2014-07-22 ENCOUNTER — Ambulatory Visit (HOSPITAL_BASED_OUTPATIENT_CLINIC_OR_DEPARTMENT_OTHER): Payer: Commercial Managed Care - HMO | Admitting: Hematology & Oncology

## 2014-07-22 VITALS — BP 128/65 | HR 104 | Temp 97.5°F | Resp 14 | Ht 64.0 in | Wt 161.0 lb

## 2014-07-22 DIAGNOSIS — C50312 Malignant neoplasm of lower-inner quadrant of left female breast: Secondary | ICD-10-CM

## 2014-07-22 DIAGNOSIS — C50812 Malignant neoplasm of overlapping sites of left female breast: Secondary | ICD-10-CM

## 2014-07-22 DIAGNOSIS — Z170 Estrogen receptor positive status [ER+]: Secondary | ICD-10-CM

## 2014-07-22 DIAGNOSIS — E559 Vitamin D deficiency, unspecified: Secondary | ICD-10-CM

## 2014-07-22 DIAGNOSIS — E119 Type 2 diabetes mellitus without complications: Secondary | ICD-10-CM

## 2014-07-22 DIAGNOSIS — C50919 Malignant neoplasm of unspecified site of unspecified female breast: Secondary | ICD-10-CM

## 2014-07-22 DIAGNOSIS — Z23 Encounter for immunization: Secondary | ICD-10-CM

## 2014-07-22 LAB — CBC WITH DIFFERENTIAL (CANCER CENTER ONLY)
BASO#: 0 10*3/uL (ref 0.0–0.2)
BASO%: 0.4 % (ref 0.0–2.0)
EOS%: 1.3 % (ref 0.0–7.0)
Eosinophils Absolute: 0.1 10*3/uL (ref 0.0–0.5)
HCT: 44.4 % (ref 34.8–46.6)
HGB: 14.9 g/dL (ref 11.6–15.9)
LYMPH#: 1.4 10*3/uL (ref 0.9–3.3)
LYMPH%: 21.1 % (ref 14.0–48.0)
MCH: 32.2 pg (ref 26.0–34.0)
MCHC: 33.6 g/dL (ref 32.0–36.0)
MCV: 96 fL (ref 81–101)
MONO#: 0.4 10*3/uL (ref 0.1–0.9)
MONO%: 5.1 % (ref 0.0–13.0)
NEUT#: 4.9 10*3/uL (ref 1.5–6.5)
NEUT%: 72.1 % (ref 39.6–80.0)
Platelets: 140 10*3/uL — ABNORMAL LOW (ref 145–400)
RBC: 4.63 10*6/uL (ref 3.70–5.32)
RDW: 12.2 % (ref 11.1–15.7)
WBC: 6.8 10*3/uL (ref 3.9–10.0)

## 2014-07-22 MED ORDER — INFLUENZA VAC SPLIT QUAD 0.5 ML IM SUSY
0.5000 mL | PREFILLED_SYRINGE | INTRAMUSCULAR | Status: AC
  Administered 2014-07-22: 0.5 mL via INTRAMUSCULAR
  Filled 2014-07-22: qty 0.5

## 2014-07-22 NOTE — Patient Instructions (Signed)

## 2014-07-22 NOTE — Progress Notes (Signed)
Hematology and Oncology Follow Up Visit  Michelle Long [id number] [birth year] 68 y.o. 07/22/2014   Principle Diagnosis:   Stage IIA (T2 N0M0) infiltrating ductal carcinoma of the left breast  Current Therapy:    Tamoxifen 20 mg by mouth daily     Interim History:  Ms.  Long is in for followup. We see her every 6 months. She has been doing well. She had a good summer. She's doing well healthwise. She had no problems with her diabetes. She's had no nausea or vomiting. She's had no leg swelling. She had no rashes. As far as her breast cancer is concerned, she is doing well with this. She has been on tamoxifen for over 8 years.. She's had no bony pain. She's had no nausea or vomiting. There's been no change in bowel or bladder habits. She's had no issues with the flu this year. She did get a flu shot.  She had her mammogram in August. This looked okay with no evidence of a suspicious microcalcifications.  Medications: Current outpatient prescriptions:albuterol (PROAIR HFA) 108 (90 BASE) MCG/ACT inhaler, Inhale 2 puffs into the lungs every 6 (six) hours as needed for wheezing or shortness of breath., Disp: 1 Inhaler, Rfl: 3;  aspirin 81 MG tablet, Take 81 mg by mouth daily., Disp: , Rfl: ;  CALCIUM-MAGNESIUM-ZINC PO, Take 2 tablets by mouth daily. 1000 mg total, Disp: , Rfl:  Cholecalciferol (VITAMIN D) 1000 UNITS capsule, Take 3,000 Units by mouth daily. , Disp: , Rfl: ;  citalopram (CELEXA) 10 MG tablet, Take 0.5 tablets (5 mg total) by mouth at bedtime. Taking 5 mg. Only., Disp: 90 tablet, Rfl: 1;  fish oil-omega-3 fatty acids 1000 MG capsule, Take 1 g by mouth daily.  , Disp: , Rfl: ;  LORazepam (ATIVAN) 0.5 MG tablet, Take 1 tablet (0.5 mg total) by mouth 2 (two) times daily as needed., Disp: 90 tablet, Rfl: 0 losartan (COZAAR) 50 MG tablet, Take 1 tablet (50 mg total) by mouth daily., Disp: 90 tablet, Rfl: 3;  metFORMIN (GLUCOPHAGE) 500 MG tablet, Take 1 tablet (500 mg total) by mouth 2 (two)  times daily with a meal., Disp: 180 tablet, Rfl: 1;  OLANZapine (ZYPREXA) 5 MG tablet, Take 5 mg by mouth daily., Disp: , Rfl: ;  ONE TOUCH ULTRA TEST test strip, USE ONE STRIP TO CHECK GLUCOSE ONCE DAILY, Disp: 100 each, Rfl: 0 ONETOUCH DELICA LANCETS FINE MISC, 1 each by Does not apply route daily. Test Blood sugar once a day. Dx 250.00, Disp: 100 each, Rfl: 5;  pantoprazole (PROTONIX) 40 MG tablet, Take 1 tablet (40 mg total) by mouth daily., Disp: 90 tablet, Rfl: 1;  SYMBICORT 160-4.5 MCG/ACT inhaler, INHALE TWO PUFFS BY MOUTH TWICE DAILY, Disp: 1 Inhaler, Rfl: 3;  tamoxifen (NOLVADEX) 20 MG tablet, TAKE ONE TABLET BY MOUTH ONCE DAILY, Disp: 90 tablet, Rfl: 0  Allergies:  Allergies  Allergen Reactions  . Lamictal [Lamotrigine]     itch  . Prozac [Fluoxetine Hcl]     seizure  . Sulfa Drugs Cross Reactors     Dyspnea     Past Medical History, Surgical history, Social history, and Family History were reviewed and updated.  Review of Systems: As above  Physical Exam:  height is 5\' 4"  (1.626 m) and weight is 161 lb (73.029 kg). Her oral temperature is 97.5 F (36.4 C). Her blood pressure is 128/65 and her pulse is 104. Her respiration is 14.   Well-developed well-nourished white female. She is no  ocular or oral lesions. There is no palpable cervical or supraclavicular lymph nodes. Lungs are clear folic. There are no rales wheezes or rhonchi. Cardiac exam regular rate and rhythm with no murmurs rubs or bruits. Breast exam shows right breast no masses edema or erythema. There is no right axillary adenopathy. Left breast shows a well-healed lumpectomy at about the 6:00 position. There is some contraction of the left breast. There's no mass. There is no left axillary adenopathy. Abdomen is soft. She has good bowel sounds. There is no fluid wave. There is no palpable hepato- splenomegaly back exam no tenderness over the spine ribs or hips. She has no kyphosis. Extremities shows some osteoarthritic  changes. Has good strength. Skin exam no rashes. Neurological exam no focal neurological deficits.  Lab Results  Component Value Date   WBC 6.8 07/22/2014   HGB 14.9 07/22/2014   HCT 44.4 07/22/2014   MCV 96 07/22/2014   PLT 140* 07/22/2014     Chemistry      Component Value Date/Time   NA 140 05/31/2014 1136   K 4.1 05/31/2014 1136   CL 105 05/31/2014 1136   CO2 23 05/31/2014 1136   BUN 11 05/31/2014 1136   CREATININE 0.8 05/31/2014 1136      Component Value Date/Time   CALCIUM 9.2 05/31/2014 1136   ALKPHOS 46 05/31/2014 1136   AST 67* 05/31/2014 1136   ALT 55* 05/31/2014 1136   BILITOT 0.7 05/31/2014 1136         Impression and Plan: Michelle Long is a 68 year old postmenopausal female. She has stage IIA ductal carcinoma left breast. Her breast cancer is triple positive. She's on tamoxifen now. I plan for a total of 10 years of tamoxifen.  I do not see any evidence of recurrence.   She did have some laboratory done about 2 months ago. Her liver function tests were up a little bit. Again, I don't see any issues from my point of view. I would not think this would be related to breast cancer recurrence.     Volanda Napoleon, MD 10/2/20155:24 PM

## 2014-07-23 LAB — COMPREHENSIVE METABOLIC PANEL
ALT: 46 U/L — ABNORMAL HIGH (ref 0–35)
AST: 56 U/L — ABNORMAL HIGH (ref 0–37)
Albumin: 4.4 g/dL (ref 3.5–5.2)
Alkaline Phosphatase: 58 U/L (ref 39–117)
BUN: 11 mg/dL (ref 6–23)
CO2: 23 mEq/L (ref 19–32)
Calcium: 9.1 mg/dL (ref 8.4–10.5)
Chloride: 103 mEq/L (ref 96–112)
Creatinine, Ser: 0.74 mg/dL (ref 0.50–1.10)
Glucose, Bld: 179 mg/dL — ABNORMAL HIGH (ref 70–99)
Potassium: 4.2 mEq/L (ref 3.5–5.3)
Sodium: 140 mEq/L (ref 135–145)
Total Bilirubin: 0.3 mg/dL (ref 0.2–1.2)
Total Protein: 6.6 g/dL (ref 6.0–8.3)

## 2014-07-23 LAB — VITAMIN D 25 HYDROXY (VIT D DEFICIENCY, FRACTURES): Vit D, 25-Hydroxy: 55 ng/mL (ref 30–89)

## 2014-07-23 LAB — LACTATE DEHYDROGENASE: LDH: 177 U/L (ref 94–250)

## 2014-07-25 ENCOUNTER — Encounter: Payer: Self-pay | Admitting: Nurse Practitioner

## 2014-07-27 ENCOUNTER — Telehealth: Payer: Self-pay | Admitting: Family Medicine

## 2014-07-27 MED ORDER — ONETOUCH ULTRA MINI W/DEVICE KIT: PACK | Status: DC

## 2014-07-27 NOTE — Telephone Encounter (Signed)
[name] name: Kimmy Relation to pt: self Call back number: [phone] [facility]acy: [facility]  Reason for call:   Patient states that one touch ultra mini meter is no longer working and would like to know if Dr. Birdie Riddle would send in a new rx for this.

## 2014-07-27 NOTE — Telephone Encounter (Signed)
Med filled and pt notified.  

## 2014-07-29 ENCOUNTER — Encounter: Payer: Self-pay | Admitting: Nurse Practitioner

## 2014-07-29 NOTE — Progress Notes (Signed)
Spoke w/Pt and per Dr. Marin Olp it is ok for her to do a trial without the Tamoxifen to see if her liver enzymes will return to normal. Pt also instructed to contact her PCP regarding her other medications such as resperdal and Citalipram. Pt states she is on a research study with her PCP regarding taking multiple medications and they are aware also. She will have lab work done at her upcoming appointments in about 6-8 weeks.

## 2014-08-09 ENCOUNTER — Telehealth: Payer: Self-pay

## 2014-08-09 DIAGNOSIS — L84 Corns and callosities: Secondary | ICD-10-CM

## 2014-08-09 DIAGNOSIS — E119 Type 2 diabetes mellitus without complications: Secondary | ICD-10-CM

## 2014-08-09 NOTE — Telephone Encounter (Signed)
Michelle Long [phone]  [name] called to say she needs a referral to a podiatrist per her and Dr Jettie Booze conversation since she has [employer]. [name] also stated that she has a soft corn between her last two toes that is causing her a lot of pain and she does not know what to do.

## 2014-08-10 NOTE — Telephone Encounter (Signed)
[facility] for podiatry referral (dx diabetes and corn).  Pt can use OTC corn/callous pad for pain relief until podiatry can see her

## 2014-08-10 NOTE — Telephone Encounter (Signed)
Referral placed.

## 2014-08-23 ENCOUNTER — Telehealth: Payer: Self-pay | Admitting: Family Medicine

## 2014-08-23 DIAGNOSIS — F3160 Bipolar disorder, current episode mixed, unspecified: Secondary | ICD-10-CM

## 2014-08-23 DIAGNOSIS — K219 Gastro-esophageal reflux disease without esophagitis: Secondary | ICD-10-CM

## 2014-08-23 DIAGNOSIS — C679 Malignant neoplasm of bladder, unspecified: Secondary | ICD-10-CM

## 2014-08-23 NOTE — Telephone Encounter (Signed)
All referrals ordered.

## 2014-08-23 NOTE — Addendum Note (Signed)
Addended by: Kris Hartmann on: 08/23/2014 02:03 PM   Modules accepted: Orders

## 2014-08-23 NOTE — Telephone Encounter (Signed)
[name] name:Nicolette Bang Relation to NB:ZXYD Call back [phone] Pharmacy:  Reason for call: pt states she has [employer] gold and is needing referrals for the following: Dr. Dalstadt-urologist- appt has been scheduled already 07/24/15 @2 :00 Dr. Reddy[facility]- appt has been scheduled already-2/ 25/16 @1 :00pm   Dr. Wenda Low with [facility]- appt need to be made in Jan/2016. 6 month evaluation, pt does not want morning appt.

## 2014-08-23 NOTE — Telephone Encounter (Signed)
Will need referrals placed/need dx codes

## 2014-08-24 ENCOUNTER — Encounter: Payer: Self-pay | Admitting: Podiatry

## 2014-08-24 ENCOUNTER — Ambulatory Visit (INDEPENDENT_AMBULATORY_CARE_PROVIDER_SITE_OTHER): Payer: Commercial Managed Care - HMO | Admitting: Podiatry

## 2014-08-24 ENCOUNTER — Ambulatory Visit (INDEPENDENT_AMBULATORY_CARE_PROVIDER_SITE_OTHER): Payer: Commercial Managed Care - HMO

## 2014-08-24 VITALS — BP 136/91 | HR 104 | Resp 15 | Ht 64.0 in | Wt 160.0 lb

## 2014-08-24 DIAGNOSIS — L608 Other nail disorders: Secondary | ICD-10-CM

## 2014-08-24 DIAGNOSIS — B351 Tinea unguium: Secondary | ICD-10-CM

## 2014-08-24 DIAGNOSIS — L609 Nail disorder, unspecified: Secondary | ICD-10-CM

## 2014-08-24 DIAGNOSIS — Q828 Other specified congenital malformations of skin: Secondary | ICD-10-CM

## 2014-08-24 DIAGNOSIS — M79675 Pain in left toe(s): Secondary | ICD-10-CM

## 2014-08-24 DIAGNOSIS — M205X9 Other deformities of toe(s) (acquired), unspecified foot: Secondary | ICD-10-CM

## 2014-08-24 LAB — HM DIABETES FOOT EXAM: HM Diabetic Foot Exam: NORMAL

## 2014-08-24 NOTE — Patient Instructions (Signed)
Follow up with Dermatologist.   Diabetes and Foot Care Diabetes may cause you to have problems because of poor blood supply (circulation) to your feet and legs. This may cause the skin on your feet to become thinner, break easier, and heal more slowly. Your skin may become dry, and the skin may peel and crack. You may also have nerve damage in your legs and feet causing decreased feeling in them. You may not notice minor injuries to your feet that could lead to infections or more serious problems. Taking care of your feet is one of the most important things you can do for yourself.  HOME CARE INSTRUCTIONS  Wear shoes at all times, even in the house. Do not go barefoot. Bare feet are easily injured.  Check your feet daily for blisters, cuts, and redness. If you cannot see the bottom of your feet, use a mirror or ask someone for help.  Wash your feet with warm water (do not use hot water) and mild soap. Then pat your feet and the areas between your toes until they are completely dry. Do not soak your feet as this can dry your skin.  Apply a moisturizing lotion or petroleum jelly (that does not contain alcohol and is unscented) to the skin on your feet and to dry, brittle toenails. Do not apply lotion between your toes.  Trim your toenails straight across. Do not dig under them or around the cuticle. File the edges of your nails with an emery board or nail file.  Do not cut corns or calluses or try to remove them with medicine.  Wear clean socks or stockings every day. Make sure they are not too tight. Do not wear knee-high stockings since they may decrease blood flow to your legs.  Wear shoes that fit properly and have enough cushioning. To break in new shoes, wear them for just a few hours a day. This prevents you from injuring your feet. Always look in your shoes before you put them on to be sure there are no objects inside.  Do not cross your legs. This may decrease the blood flow to your  feet.  If you find a minor scrape, cut, or break in the skin on your feet, keep it and the skin around it clean and dry. These areas may be cleansed with mild soap and water. Do not cleanse the area with peroxide, alcohol, or iodine.  When you remove an adhesive bandage, be sure not to damage the skin around it.  If you have a wound, look at it several times a day to make sure it is healing.  Do not use heating pads or hot water bottles. They may burn your skin. If you have lost feeling in your feet or legs, you may not know it is happening until it is too late.  Make sure your health care provider performs a complete foot exam at least annually or more often if you have foot problems. Report any cuts, sores, or bruises to your health care provider immediately. SEEK MEDICAL CARE IF:   You have an injury that is not healing.  You have cuts or breaks in the skin.  You have an ingrown nail.  You notice redness on your legs or feet.  You feel burning or tingling in your legs or feet.  You have pain or cramps in your legs and feet.  Your legs or feet are numb.  Your feet always feel cold. SEEK IMMEDIATE MEDICAL CARE IF:  There is increasing redness, swelling, or pain in or around a wound.  There is a red line that goes up your leg.  Pus is coming from a wound.  You develop a fever or as directed by your health care provider.  You notice a bad smell coming from an ulcer or wound. Document Released: 10/04/2000 Document Revised: 06/09/2013 Document Reviewed: 03/16/2013 [facility] Patient Information 2015 [place], [place]. This information is not intended to replace advice given to you by your health care provider. Make sure you discuss any questions you have with your health care provider.

## 2014-08-24 NOTE — Progress Notes (Signed)
Subjective: [name] presents the office today for diabetic risk assessment as well as painful corns on her inside and outside aspect of the left fifth toe. She states of these areas are particularly painful with shoe gear. She does state that she is diabetic and her last blood sugar was checked this morning was 129. She denies any history of ulceration, tingling or numbness or any claudication symptoms. No other complaints at this time.   Review of systems: Systems reviewed and were negative other than above-mentioned  Objective: AAO x3, NAD DP/PT pulses palpable bilaterally, CRT less than 3 seconds Protective sensation intact with Simms Weinstein monofilament, vibratory sensation intact, Achilles tendon reflex intact Left foot hyperkeratotic lesion on the lateral aspect of the fifth digit as well as in the left 4th interspace between the 4th and fifth digit. Is no surrounding erythema, redness, drainage or other clinical signs of infection. Adductovarus deformity of fourth and fifth digits bilaterally. Hammertoe contractures bilaterally. Nails are dystrophic, hardened, discolored and slightly elongated. On the right fifth digit there is a longitudinal dark streak within the nail. There is also one on the left foot however the right is more severe. Subjective the patient does not know how long this has been there but she does state it has been there for some time. There is no extension into the skin, no ulceration and no drainage. MMT 5/5, ROM WNL No calf pain, swelling, warmth, erythema  Assessment: 68 year old female presents for diabetic risk assessment with symptomatically or and left fifth digit, with longitudinal melanonychia.  Plan: -Treatment options discussed including alternatives, risks, complications. -Hyperkeratotic lesion fifth digits sharply debrided without complications. Dispensed offloading pads to help protect the area. -Discussed the importance of daily foot inspection to  monitor for any changes. If any changes are to occur she was directed to call the office. -For the longitudinal melanonychia on the right fifth toenail patient is unsure, long its been there although she does feel like it has been there for some time. However given concerns discussed with her possible biopsy of the area versus dermatology evaluation. At this time the patient states that she wants to be seen by dermatologist before undergoing a biopsy. I will put in a referral for this. Discussed various etiologies and complications of this condition. -Follow-up in 3 months or sooner if any problems are to arise or any changes symptoms. In the meantime call the office with any questions, concerns.

## 2014-08-30 ENCOUNTER — Other Ambulatory Visit: Payer: Self-pay | Admitting: *Deleted

## 2014-08-30 MED ORDER — BUDESONIDE-FORMOTEROL FUMARATE 160-4.5 MCG/ACT IN AERO: INHALATION_SPRAY | RESPIRATORY_TRACT | Status: DC

## 2014-09-02 ENCOUNTER — Telehealth: Payer: Self-pay | Admitting: *Deleted

## 2014-09-02 NOTE — Telephone Encounter (Signed)
Script for Symbicort along with [facility] refill promotion form faxed to [place] at [phone]. Fax confirmation received. JG//CMA

## 2014-09-07 ENCOUNTER — Encounter: Payer: Self-pay | Admitting: Family Medicine

## 2014-09-07 DIAGNOSIS — L819 Disorder of pigmentation, unspecified: Secondary | ICD-10-CM

## 2014-09-08 ENCOUNTER — Encounter: Payer: Self-pay | Admitting: General Practice

## 2014-09-09 NOTE — Telephone Encounter (Signed)
Referral placed.

## 2014-09-14 ENCOUNTER — Telehealth: Payer: Self-pay | Admitting: *Deleted

## 2014-09-14 DIAGNOSIS — L608 Other nail disorders: Secondary | ICD-10-CM

## 2014-09-14 NOTE — Telephone Encounter (Signed)
I called and scheduled patient an appointment at [facility] per Dr. Jacqualyn Posey.  He wants to rule out Melanonychia of the right 5th toenail.  Patient was scheduled for 10/10/14 at 11am with a PA.  I was advised to contact patient's primary care doctor to send a referral because patient has [facility].  I called and left the patient a message about her appointment at [facility] at [place] on 10/10/14 at 11am.  Call if you have any questions.  I called and asked Delsa Sale to get an authorization from [facility] for [facility].  Patient is scheduled for 10/10/2014 at 11am.  I had to call her back with the PA, Arlyss Gandy I left a message.  Call if you have any questions.

## 2014-09-14 NOTE — Telephone Encounter (Signed)
Pt states she received a call from our office, but could not understand the instructions.  I read the 09/14/2014 note from Mammie Russian explaining the [facility] appt and authorization.  Pt states she thought Dr. Birdie Riddle was to make the appt and authorize it due to her insurance.

## 2014-09-19 NOTE — Telephone Encounter (Signed)
I returned her call.  "I know I am scheduled for an appointment on 10/10/2014 with [facility].  Is there anything I need to do?"  I told her I had already contacted Dr. Virgil Benedict office to get them to do a referral to [facility].  "Where are they located?  I prefer to go to the one in [facility], I'm not familiar with the roads in [place]."  I told her that it may cause a problem with the referral if we have to change the location.  "Well never mind leave it as it is.  What's their address or the phone number?"  I told her [place] and gave her the phone number, [phone].

## 2014-09-19 NOTE — Telephone Encounter (Signed)
Delydia, please contact this pt and explain the authorization and or process to her concerning her Dr. Birdie Riddle and insurance.  She says she can't understand the messages that are left.  Thanks, Marcy Siren

## 2014-09-20 ENCOUNTER — Other Ambulatory Visit: Payer: Self-pay | Admitting: General Practice

## 2014-09-20 ENCOUNTER — Telehealth: Payer: Self-pay | Admitting: Family Medicine

## 2014-09-20 MED ORDER — BUDESONIDE-FORMOTEROL FUMARATE 160-4.5 MCG/ACT IN AERO: INHALATION_SPRAY | RESPIRATORY_TRACT | Status: DC

## 2014-09-20 NOTE — Telephone Encounter (Signed)
[name] name: edeline, greening Relation to KM:MNOT Call back number:[phone] Pharmacy:  Reason for call: pt states she has an appointment on 11/04/14 at 2:00pm with Dr. Shana Chute, GI  pt is needing a referral sent. Pt has [employer]

## 2014-09-20 NOTE — Telephone Encounter (Signed)
Referral to GI along with [facility] referral completed on 08/23/2014. JG//CMA

## 2014-09-28 ENCOUNTER — Ambulatory Visit (INDEPENDENT_AMBULATORY_CARE_PROVIDER_SITE_OTHER): Payer: Commercial Managed Care - HMO | Admitting: Family Medicine

## 2014-09-28 ENCOUNTER — Encounter: Payer: Self-pay | Admitting: Family Medicine

## 2014-09-28 VITALS — BP 126/70 | HR 80 | Temp 97.8°F | Resp 16 | Ht 64.0 in | Wt 161.1 lb

## 2014-09-28 DIAGNOSIS — E1165 Type 2 diabetes mellitus with hyperglycemia: Secondary | ICD-10-CM

## 2014-09-28 DIAGNOSIS — E785 Hyperlipidemia, unspecified: Secondary | ICD-10-CM

## 2014-09-28 DIAGNOSIS — C50312 Malignant neoplasm of lower-inner quadrant of left female breast: Secondary | ICD-10-CM

## 2014-09-28 DIAGNOSIS — Z0000 Encounter for general adult medical examination without abnormal findings: Secondary | ICD-10-CM

## 2014-09-28 DIAGNOSIS — E119 Type 2 diabetes mellitus without complications: Secondary | ICD-10-CM

## 2014-09-28 DIAGNOSIS — E559 Vitamin D deficiency, unspecified: Secondary | ICD-10-CM

## 2014-09-28 LAB — CBC WITH DIFFERENTIAL/PLATELET
Basophils Absolute: 0 10*3/uL (ref 0.0–0.1)
Basophils Relative: 0.8 % (ref 0.0–3.0)
Eosinophils Absolute: 0.3 10*3/uL (ref 0.0–0.7)
Eosinophils Relative: 5 % (ref 0.0–5.0)
HCT: 43.2 % (ref 36.0–46.0)
Hemoglobin: 14.4 g/dL (ref 12.0–15.0)
Lymphocytes Relative: 30.7 % (ref 12.0–46.0)
Lymphs Abs: 1.6 10*3/uL (ref 0.7–4.0)
MCHC: 33.2 g/dL (ref 30.0–36.0)
MCV: 94.9 fl (ref 78.0–100.0)
Monocytes Absolute: 0.4 10*3/uL (ref 0.1–1.0)
Monocytes Relative: 8.9 % (ref 3.0–12.0)
Neutro Abs: 2.8 10*3/uL (ref 1.4–7.7)
Neutrophils Relative %: 54.6 % (ref 43.0–77.0)
Platelets: 144 10*3/uL — ABNORMAL LOW (ref 150.0–400.0)
RBC: 4.56 Mil/uL (ref 3.87–5.11)
RDW: 13.8 % (ref 11.5–15.5)
WBC: 5.1 10*3/uL (ref 4.0–10.5)

## 2014-09-28 LAB — BASIC METABOLIC PANEL
BUN: 12 mg/dL (ref 6–23)
CO2: 28 mEq/L (ref 19–32)
Calcium: 9 mg/dL (ref 8.4–10.5)
Chloride: 102 mEq/L (ref 96–112)
Creatinine, Ser: 0.8 mg/dL (ref 0.4–1.2)
GFR: 79.12 mL/min (ref >60.00)
Glucose, Bld: 159 mg/dL — ABNORMAL HIGH (ref 70–99)
Potassium: 4.5 mEq/L (ref 3.5–5.1)
Sodium: 138 mEq/L (ref 135–145)

## 2014-09-28 LAB — HEPATIC FUNCTION PANEL
ALT: 58 U/L — ABNORMAL HIGH (ref 0–35)
AST: 76 U/L — ABNORMAL HIGH (ref 0–37)
Albumin: 4.1 g/dL (ref 3.5–5.2)
Alkaline Phosphatase: 50 U/L (ref 39–117)
Bilirubin, Direct: 0.1 mg/dL (ref 0.0–0.3)
Total Bilirubin: 0.6 mg/dL (ref 0.2–1.2)
Total Protein: 6.7 g/dL (ref 6.0–8.3)

## 2014-09-28 LAB — LIPID PANEL
Cholesterol: 218 mg/dL — ABNORMAL HIGH (ref 0–200)
HDL: 42.1 mg/dL (ref >39.00)
LDL Cholesterol: 150 mg/dL — ABNORMAL HIGH (ref 0–99)
NonHDL: 175.9
Total CHOL/HDL Ratio: 5
Triglycerides: 130 mg/dL (ref 0.0–149.0)
VLDL: 26 mg/dL (ref 0.0–40.0)

## 2014-09-28 LAB — VITAMIN D 25 HYDROXY (VIT D DEFICIENCY, FRACTURES): VITD: 49.05 ng/mL (ref 30.00–100.00)

## 2014-09-28 LAB — TSH: TSH: 1.46 u[IU]/mL (ref 0.35–4.50)

## 2014-09-28 LAB — HEMOGLOBIN A1C: Hgb A1c MFr Bld: 7.5 % — ABNORMAL HIGH (ref 4.6–6.5)

## 2014-09-28 NOTE — Patient Instructions (Signed)
Follow up in 3-4 months to recheck diabetes We'll notify you of your lab results and make any changes if needed Restart the Tamoxifen Keep up the good work!  You look great! Call with any questions or concerns Happy Holidays!!!

## 2014-09-28 NOTE — Assessment & Plan Note (Signed)
Chronic problem, attempting to control w/ healthy diet and regular exercise.  Statin intolerant.  Check labs.

## 2014-09-28 NOTE — Assessment & Plan Note (Signed)
Pt has hx of this.  Check labs.  Replete prn. 

## 2014-09-28 NOTE — Progress Notes (Signed)
   Subjective:    Patient ID: Michelle Long, female    DOB: 28-Jan-1946, 68 y.o.   MRN: [id number]  HPI Here today for CPE.  Risk Factors: Hyperlipidemia- chronic problem.  Attempting to control w/ healthy diet and regular exercise.  Not currently on meds. DM- chronic problem, on Metformin.  On ARB for renal protection.  UTD on eye exam. Breast Cancer- stopped Tamoxifen 2 months ago due to elevated liver functions. Physical Activity: not exercising regularly but very active Fall Risk: low Depression: chronic problem, bipolar.  Seeing psych regularly.  Well controlled.  Lost mother in August but doing well with this. Hearing: normal to conversational tones and whispered voice at 6 ft ADL's: independent Cognitive: normal linear thought process, memory and attention intact Home Safety: safe at home, lives alone Height, Weight, BMI, Visual Acuity: see vitals, vision corrected to 20/20 w/ glasses Counseling: UTD on colonoscopy, DEXA, mammo.  No need for paps due to hysterectomy. Labs Ordered: See A&P Care Plan: See A&P    Review of Systems Patient reports no vision/ hearing changes, adenopathy,fever, weight change,  persistant/recurrent hoarseness , swallowing issues, chest pain, palpitations, edema, persistant/recurrent cough, hemoptysis, dyspnea (rest/exertional/paroxysmal nocturnal), gastrointestinal bleeding (melena, rectal bleeding), abdominal pain, significant heartburn, bowel changes, GU symptoms (dysuria, hematuria, incontinence), Gyn symptoms (abnormal  bleeding, pain),  syncope, focal weakness, memory loss, numbness & tingling, skin/hair/nail changes, abnormal bruising or bleeding, anxiety, or depression.     Objective:   Physical Exam General Appearance:    Alert, cooperative, no distress, appears stated age  Head:    Normocephalic, without obvious abnormality, atraumatic  Eyes:    PERRL, conjunctiva/corneas clear, EOM's intact, fundi    benign, both eyes  Ears:    Normal TM's and  external ear canals, both ears  Nose:   Nares normal, septum midline, mucosa normal, no drainage    or sinus tenderness  Throat:   Lips, mucosa, and tongue normal; teeth and gums normal  Neck:   Supple, symmetrical, trachea midline, no adenopathy;    Thyroid: no enlargement/tenderness/nodules  Back:     Symmetric, no curvature, ROM normal, no CVA tenderness  Lungs:     Clear to auscultation bilaterally, respirations unlabored  Chest Wall:    No tenderness or deformity   Heart:    Regular rate and rhythm, S1 and S2 normal, no murmur, rub   or gallop  Breast Exam:    Deferred to GYN  Abdomen:     Soft, non-tender, bowel sounds active all four quadrants,    no masses, no organomegaly  Genitalia:    Deferred to GYN  Rectal:    Extremities:   Extremities normal, atraumatic, no cyanosis or edema  Pulses:   2+ and symmetric all extremities  Skin:   Skin color, texture, turgor normal, no rashes or lesions  Lymph nodes:   Cervical, supraclavicular, and axillary nodes normal  Neurologic:   CNII-XII intact, normal strength, sensation and reflexes    throughout          Assessment & Plan:

## 2014-09-28 NOTE — Progress Notes (Signed)
Pre visit review using our clinic review tool, if applicable. No additional management support is needed unless otherwise documented below in the visit note. 

## 2014-09-28 NOTE — Assessment & Plan Note (Signed)
Chronic problem, UTD on eye exam.  On ARB for renal protection.  Tolerating metformin w/o difficulty.  Check labs.  Adjust meds prn

## 2014-09-28 NOTE — Assessment & Plan Note (Signed)
Chronic problem.  Encouraged pt to restart Tamoxifen.  She continues to follow w/ Dr Marin Olp regularly

## 2014-09-28 NOTE — Assessment & Plan Note (Signed)
Pt's PE WNL.  UTD on pap, mammo, DEXA, colonoscopy.  Written screening schedule updated and given to pt.  Check labs.  Anticipatory guidance provided.

## 2014-09-29 ENCOUNTER — Other Ambulatory Visit: Payer: Self-pay | Admitting: Family Medicine

## 2014-09-29 DIAGNOSIS — R7989 Other specified abnormal findings of blood chemistry: Secondary | ICD-10-CM

## 2014-09-29 DIAGNOSIS — R945 Abnormal results of liver function studies: Principal | ICD-10-CM

## 2014-09-29 MED ORDER — METFORMIN HCL 1000 MG PO TABS: 1000.0000 mg | ORAL_TABLET | Freq: Two times a day (BID) | ORAL | Status: DC

## 2014-09-30 ENCOUNTER — Ambulatory Visit (HOSPITAL_BASED_OUTPATIENT_CLINIC_OR_DEPARTMENT_OTHER)
Admission: RE | Admit: 2014-09-30 | Discharge: 2014-09-30 | Disposition: A | Discharge status: Home / Self Care | Payer: Commercial Managed Care - HMO | Source: Ambulatory Visit | Attending: Family Medicine | Admitting: Family Medicine

## 2014-09-30 DIAGNOSIS — K760 Fatty (change of) liver, not elsewhere classified: Secondary | ICD-10-CM | POA: Insufficient documentation

## 2014-09-30 DIAGNOSIS — E119 Type 2 diabetes mellitus without complications: Secondary | ICD-10-CM | POA: Insufficient documentation

## 2014-09-30 DIAGNOSIS — K7689 Other specified diseases of liver: Secondary | ICD-10-CM | POA: Insufficient documentation

## 2014-09-30 DIAGNOSIS — C50919 Malignant neoplasm of unspecified site of unspecified female breast: Secondary | ICD-10-CM | POA: Diagnosis not present

## 2014-09-30 DIAGNOSIS — R7989 Other specified abnormal findings of blood chemistry: Secondary | ICD-10-CM | POA: Insufficient documentation

## 2014-09-30 DIAGNOSIS — C679 Malignant neoplasm of bladder, unspecified: Secondary | ICD-10-CM | POA: Insufficient documentation

## 2014-09-30 DIAGNOSIS — R945 Abnormal results of liver function studies: Secondary | ICD-10-CM

## 2014-09-30 DIAGNOSIS — Z9049 Acquired absence of other specified parts of digestive tract: Secondary | ICD-10-CM | POA: Insufficient documentation

## 2014-09-30 DIAGNOSIS — K219 Gastro-esophageal reflux disease without esophagitis: Secondary | ICD-10-CM | POA: Diagnosis not present

## 2014-09-30 IMAGING — US US ABDOMEN COMPLETE
1 series · 14 of 25 positions shown · non-contrast
Comparison: None.

CLINICAL DATA: Elevated LFTs

EXAM:
ULTRASOUND ABDOMEN COMPLETE

[Series 1: us abdomen complete · 0.28mm/px · 14 of 85 slices shown]
[im 1/85]
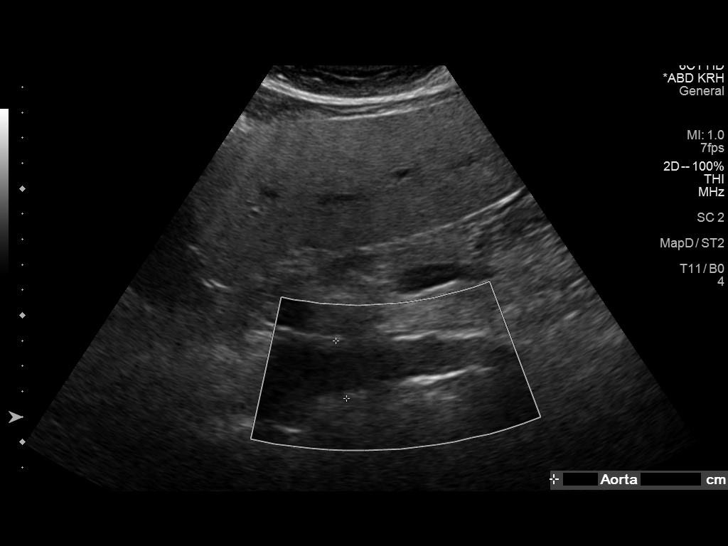
[im 8/85]
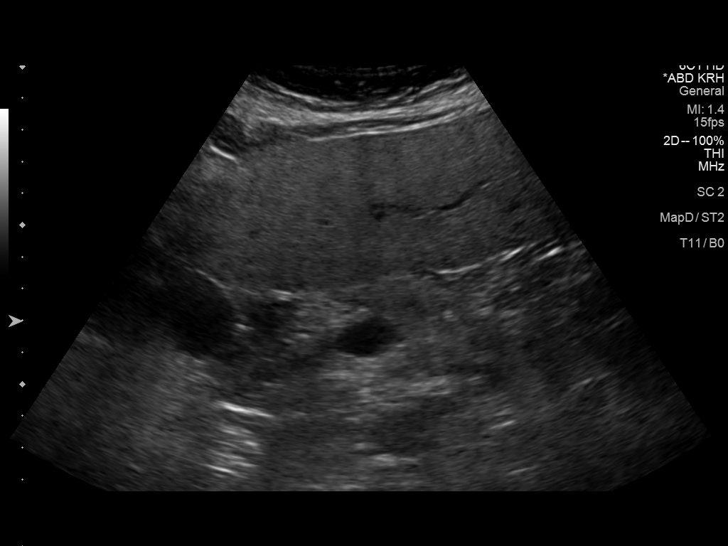
[im 15/85]
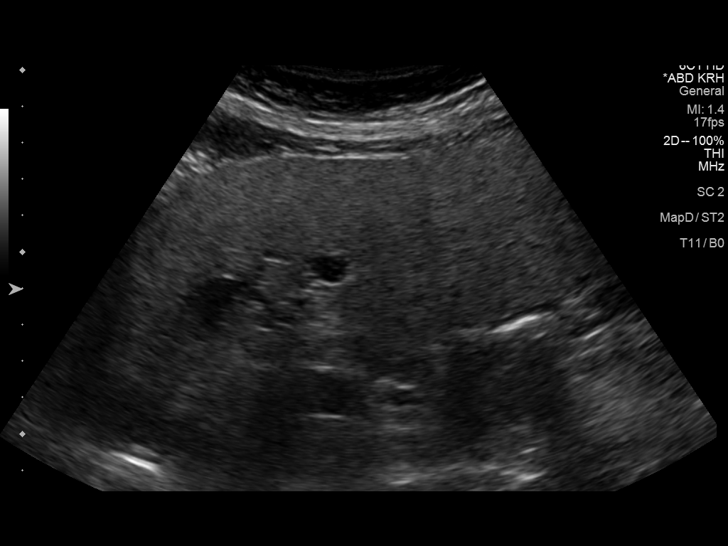
[im 22/85]
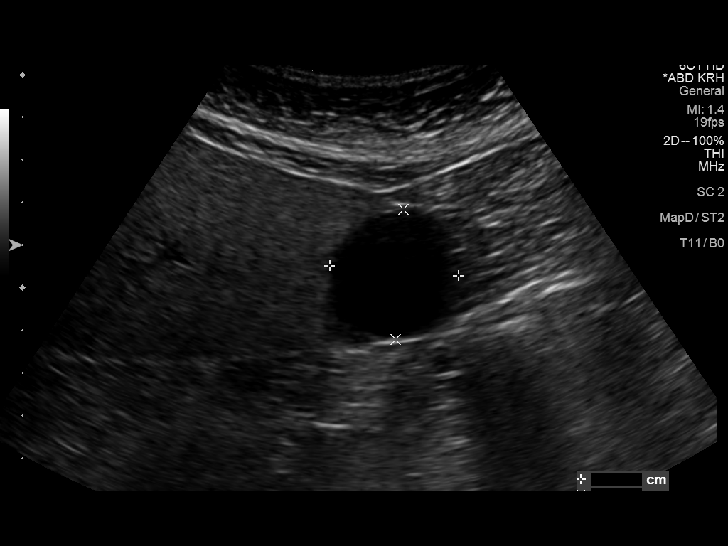
[im 29/85]
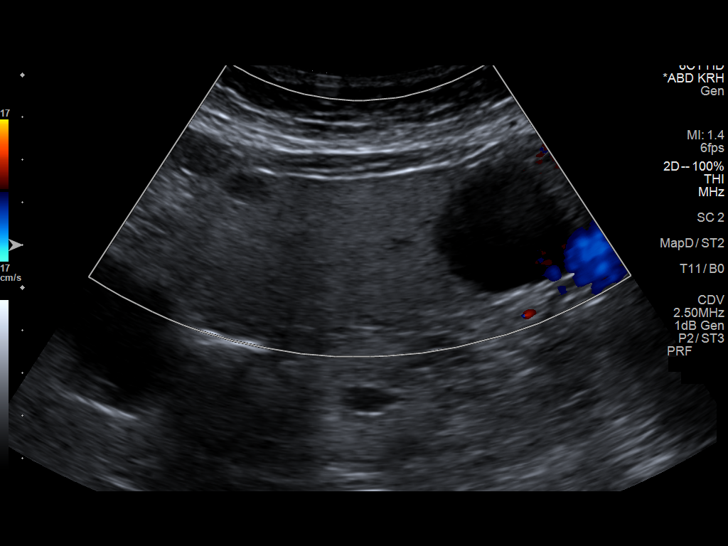
[im 32/85]
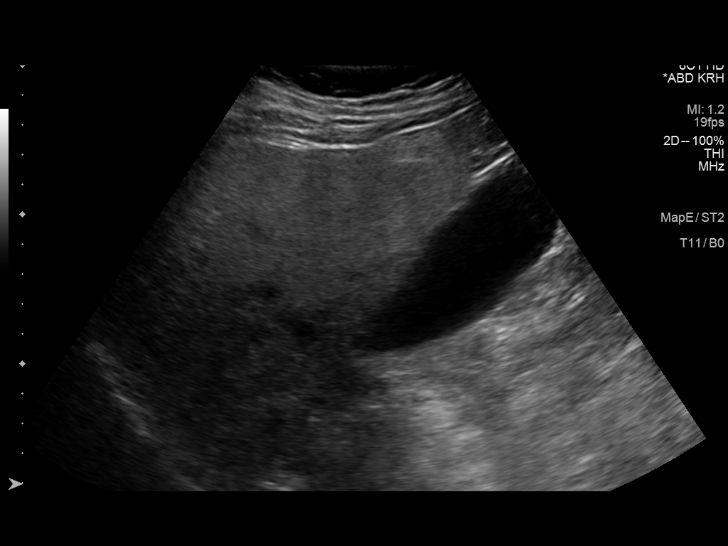
[im 39/85]
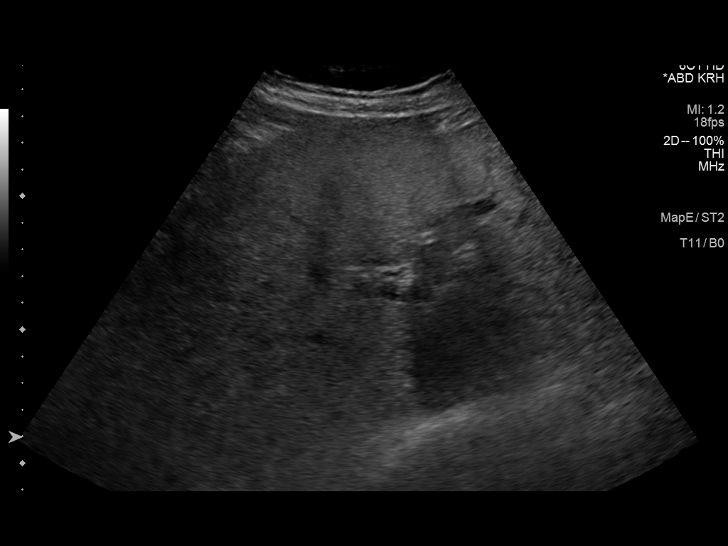
[im 46/85]
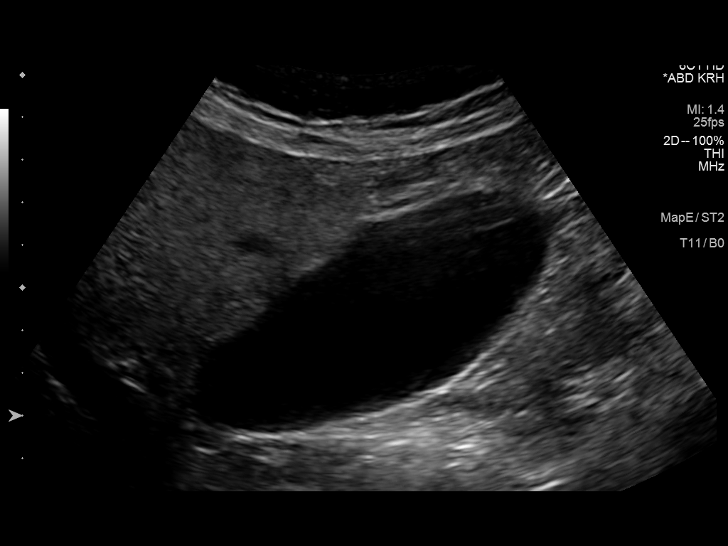
[im 53/85]
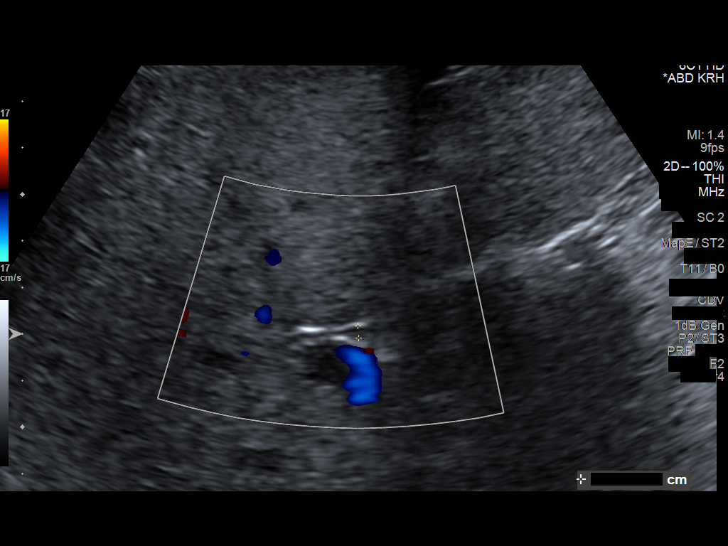
[im 57/85]
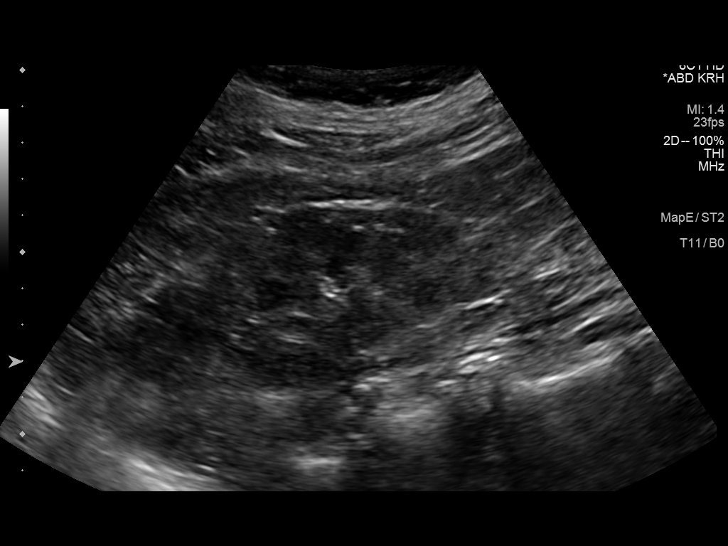
[im 64/85]
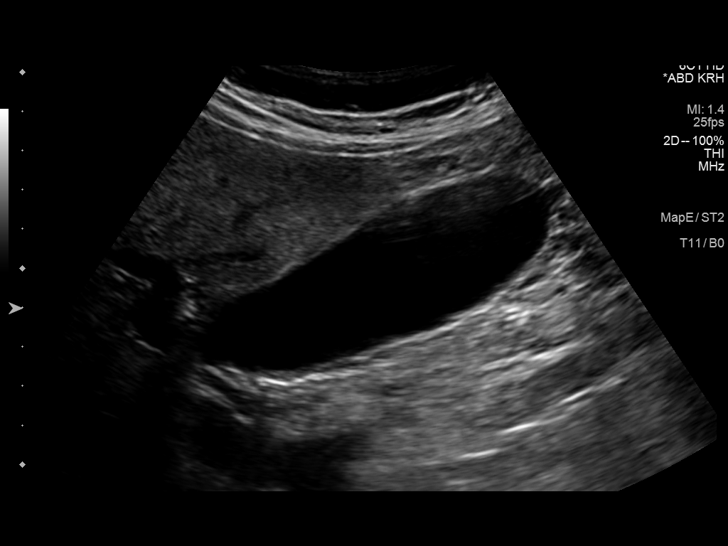
[im 71/85]
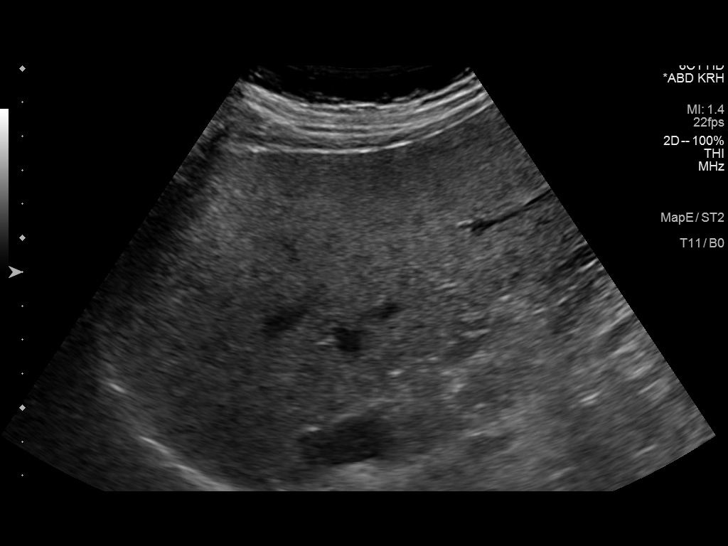
[im 78/85]
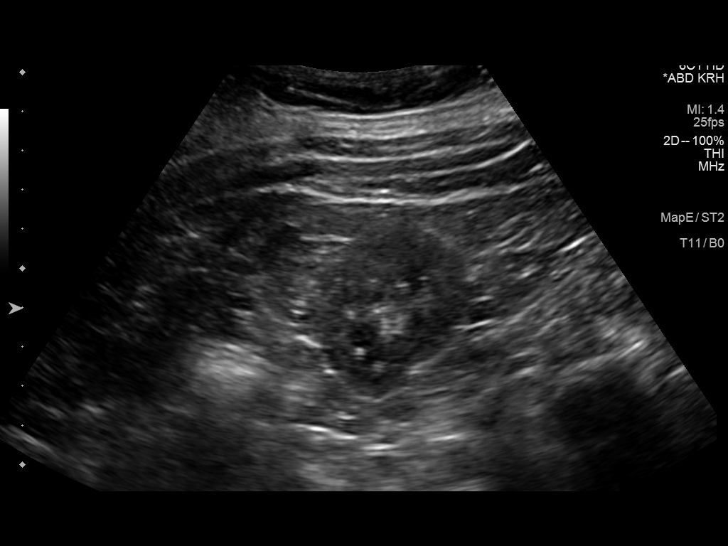
[im 85/85]
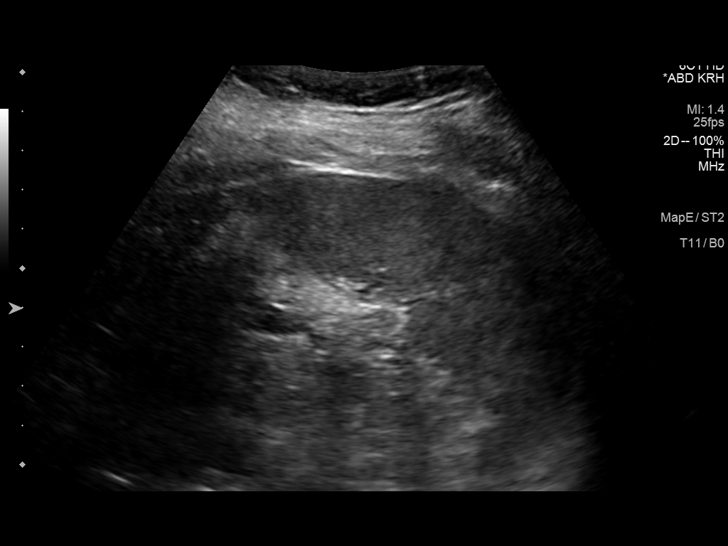

[14 of 25 positions shown; findings below may reference images not displayed]

FINDINGS: Gallbladder: No gallstones or wall thickening visualized. No
sonographic Murphy sign noted.

Common bile duct: Diameter: 3 mm

Liver: The liver is diffusely echogenic. There are several cyst
within the liver. The largest is in the left lobe measuring 3 cm.

IVC: No abnormality visualized.

Pancreas: Visualized portion unremarkable.

Spleen: Size and appearance within normal limits.

Right Kidney: Length: 12.2 cm.. Echogenicity within normal limits.
Multiple echogenic foci noted within the cortex. No mass or
hydronephrosis visualized.

Left Kidney: Length: 11.1 cm.. Echogenicity within normal limits.
Multiple echogenic foci noted within the cortex. No mass or
hydronephrosis visualized.

Abdominal aorta: No aneurysm visualized.

Other findings: None.
IMPRESSION: 1. Hepatic steatosis.
2. Liver cysts.

## 2014-10-24 DIAGNOSIS — D239 Other benign neoplasm of skin, unspecified: Secondary | ICD-10-CM | POA: Diagnosis not present

## 2014-10-24 DIAGNOSIS — L821 Other seborrheic keratosis: Secondary | ICD-10-CM | POA: Diagnosis not present

## 2014-10-24 DIAGNOSIS — L608 Other nail disorders: Secondary | ICD-10-CM | POA: Diagnosis not present

## 2014-10-27 DIAGNOSIS — R748 Abnormal levels of other serum enzymes: Secondary | ICD-10-CM | POA: Diagnosis not present

## 2014-11-04 DIAGNOSIS — K219 Gastro-esophageal reflux disease without esophagitis: Secondary | ICD-10-CM | POA: Diagnosis not present

## 2014-11-04 DIAGNOSIS — R748 Abnormal levels of other serum enzymes: Secondary | ICD-10-CM | POA: Diagnosis not present

## 2014-11-04 DIAGNOSIS — Z86010 Personal history of colonic polyps: Secondary | ICD-10-CM | POA: Diagnosis not present

## 2014-11-04 DIAGNOSIS — Z79899 Other long term (current) drug therapy: Secondary | ICD-10-CM | POA: Diagnosis not present

## 2014-11-10 ENCOUNTER — Other Ambulatory Visit: Payer: Self-pay | Admitting: Family Medicine

## 2014-11-14 NOTE — Telephone Encounter (Signed)
Med filled.  

## 2014-11-22 ENCOUNTER — Other Ambulatory Visit: Payer: Self-pay | Admitting: Family Medicine

## 2014-11-23 NOTE — Telephone Encounter (Signed)
Med filled.  

## 2014-11-24 ENCOUNTER — Other Ambulatory Visit: Payer: Self-pay | Admitting: Family Medicine

## 2014-11-24 ENCOUNTER — Telehealth: Payer: Self-pay | Admitting: Family Medicine

## 2014-11-24 NOTE — Telephone Encounter (Signed)
[name] name: Remedy, Corporan Relation to pt: self  Call back number: [phone] Pharmacy:  [facility] [place], [place] [phone] (Phone) [phone] (Fax)     Reason for call:  Pt requesting metFORMIN (GLUCOPHAGE) 1000 MG tablet

## 2014-11-24 NOTE — Telephone Encounter (Signed)
Med filled.  

## 2014-11-25 NOTE — Telephone Encounter (Signed)
This medication was filled yesterday

## 2014-11-29 ENCOUNTER — Encounter: Payer: Self-pay | Admitting: Podiatry

## 2014-11-29 ENCOUNTER — Ambulatory Visit (INDEPENDENT_AMBULATORY_CARE_PROVIDER_SITE_OTHER): Payer: Commercial Managed Care - HMO | Admitting: Podiatry

## 2014-11-29 VITALS — BP 152/78 | HR 102 | Resp 18

## 2014-11-29 DIAGNOSIS — Q828 Other specified congenital malformations of skin: Secondary | ICD-10-CM | POA: Diagnosis not present

## 2014-11-29 DIAGNOSIS — M79676 Pain in unspecified toe(s): Secondary | ICD-10-CM

## 2014-11-29 DIAGNOSIS — B351 Tinea unguium: Secondary | ICD-10-CM

## 2014-11-29 NOTE — Patient Instructions (Signed)
Diabetes and Foot Care Diabetes may cause you to have problems because of poor blood supply (circulation) to your feet and legs. This may cause the skin on your feet to become thinner, break easier, and heal more slowly. Your skin may become dry, and the skin may peel and crack. You may also have nerve damage in your legs and feet causing decreased feeling in them. You may not notice minor injuries to your feet that could lead to infections or more serious problems. Taking care of your feet is one of the most important things you can do for yourself.  HOME CARE INSTRUCTIONS  Wear shoes at all times, even in the house. Do not go barefoot. Bare feet are easily injured.  Check your feet daily for blisters, cuts, and redness. If you cannot see the bottom of your feet, use a mirror or ask someone for help.  Wash your feet with warm water (do not use hot water) and mild soap. Then pat your feet and the areas between your toes until they are completely dry. Do not soak your feet as this can dry your skin.  Apply a moisturizing lotion or petroleum jelly (that does not contain alcohol and is unscented) to the skin on your feet and to dry, brittle toenails. Do not apply lotion between your toes.  Trim your toenails straight across. Do not dig under them or around the cuticle. File the edges of your nails with an emery board or nail file.  Do not cut corns or calluses or try to remove them with medicine.  Wear clean socks or stockings every day. Make sure they are not too tight. Do not wear knee-high stockings since they may decrease blood flow to your legs.  Wear shoes that fit properly and have enough cushioning. To break in new shoes, wear them for just a few hours a day. This prevents you from injuring your feet. Always look in your shoes before you put them on to be sure there are no objects inside.  Do not cross your legs. This may decrease the blood flow to your feet.  If you find a minor scrape,  cut, or break in the skin on your feet, keep it and the skin around it clean and dry. These areas may be cleansed with mild soap and water. Do not cleanse the area with peroxide, alcohol, or iodine.  When you remove an adhesive bandage, be sure not to damage the skin around it.  If you have a wound, look at it several times a day to make sure it is healing.  Do not use heating pads or hot water bottles. They may burn your skin. If you have lost feeling in your feet or legs, you may not know it is happening until it is too late.  Make sure your health care provider performs a complete foot exam at least annually or more often if you have foot problems. Report any cuts, sores, or bruises to your health care provider immediately. SEEK MEDICAL CARE IF:   You have an injury that is not healing.  You have cuts or breaks in the skin.  You have an ingrown nail.  You notice redness on your legs or feet.  You feel burning or tingling in your legs or feet.  You have pain or cramps in your legs and feet.  Your legs or feet are numb.  Your feet always feel cold. SEEK IMMEDIATE MEDICAL CARE IF:   There is increasing redness,   swelling, or pain in or around a wound.  There is a red line that goes up your leg.  Pus is coming from a wound.  You develop a fever or as directed by your health care provider.  You notice a bad smell coming from an ulcer or wound. Document Released: 10/04/2000 Document Revised: 06/09/2013 Document Reviewed: 03/16/2013 Exit[employer] Patient Information 2015 [employer]. This information is not intended to replace advice given to you by your health care provider. Make sure you discuss any questions you have with your health care provider.  

## 2014-11-30 ENCOUNTER — Encounter: Payer: Self-pay | Admitting: Family Medicine

## 2014-11-30 ENCOUNTER — Ambulatory Visit: Payer: Commercial Managed Care - HMO | Admitting: Podiatry

## 2014-12-05 NOTE — Progress Notes (Signed)
Patient ID: Michelle Long, female   DOB: [birth year], 69 y.o.   MRN: [id number]  Subjective: 69 year old female presents the office today for diabetic risk assessment and for painful elongated toenails as well as symptomatic calluses to bilateral feet. His last appointment she was seen by the dermatologist due to longitudinal melanonychia and the right fifth toe. She denies any recent redness or drainage from the nail sites and no other complaints at this time. No acute changes since last appointment.  Objective: AAO 3, NAD DP/PT pulses palpable bilaterally, CRT less than 3 seconds Protective sensation intact with Simms Weinstein monofilament, vibratory sensation intact, Achilles tendon reflex intact. Hyperkeratotic lesions submetatarsal one and 5 bilaterally as well as medial hallux bilaterally. Upon debridement of these lesions no underlying ulceration there is no clinical signs of infection. Longitudinal melanonychia unchanged the right fifth digit Nails are dystrophic, discolored, elongated, mildly hypertrophic. There is no surrounding erythema or drainage from the nail sites. No other open lesions or pre-ulcerative lesions identified. No interdigital maceration. No other areas of tenderness to bilateral lower extremity's. No overlying edema, erythema, increase in warmth bilaterally. No pain with calf compression, swelling, warmth, erythema.  Assessment: 69 year old female with symptomatic hyperkeratotic lesions, onychomycosis  Plan: -Treatment options were discussed with the patient including alternatives, risks, complications. -Nail sharply debrided 10 without complication/bleeding. -Hyperkeratotic lesion sharply debrided 6 without complication/bleeding. -Discussed the importance daily foot inspection. -Follow-up in 3 months or sooner if any problems are to arise. In the meantime, encouraged call the office with any questions, concerns, changes symptoms.

## 2014-12-14 DIAGNOSIS — F250 Schizoaffective disorder, bipolar type: Secondary | ICD-10-CM | POA: Diagnosis not present

## 2014-12-20 ENCOUNTER — Other Ambulatory Visit: Payer: Self-pay | Admitting: Family Medicine

## 2014-12-20 NOTE — Telephone Encounter (Signed)
Med filled.  

## 2014-12-28 DIAGNOSIS — K219 Gastro-esophageal reflux disease without esophagitis: Secondary | ICD-10-CM | POA: Diagnosis not present

## 2014-12-28 DIAGNOSIS — R748 Abnormal levels of other serum enzymes: Secondary | ICD-10-CM | POA: Diagnosis not present

## 2015-01-03 DIAGNOSIS — K760 Fatty (change of) liver, not elsewhere classified: Secondary | ICD-10-CM | POA: Diagnosis not present

## 2015-01-03 DIAGNOSIS — R748 Abnormal levels of other serum enzymes: Secondary | ICD-10-CM | POA: Diagnosis not present

## 2015-01-03 DIAGNOSIS — K7689 Other specified diseases of liver: Secondary | ICD-10-CM | POA: Diagnosis not present

## 2015-01-20 ENCOUNTER — Ambulatory Visit (HOSPITAL_BASED_OUTPATIENT_CLINIC_OR_DEPARTMENT_OTHER): Payer: Commercial Managed Care - HMO | Admitting: Family

## 2015-01-20 ENCOUNTER — Encounter: Payer: Self-pay | Admitting: Family

## 2015-01-20 ENCOUNTER — Other Ambulatory Visit (HOSPITAL_BASED_OUTPATIENT_CLINIC_OR_DEPARTMENT_OTHER): Payer: Commercial Managed Care - HMO

## 2015-01-20 VITALS — BP 136/92 | HR 97 | Temp 97.5°F | Resp 14 | Ht 64.0 in | Wt 154.0 lb

## 2015-01-20 DIAGNOSIS — C50912 Malignant neoplasm of unspecified site of left female breast: Secondary | ICD-10-CM

## 2015-01-20 DIAGNOSIS — C50312 Malignant neoplasm of lower-inner quadrant of left female breast: Secondary | ICD-10-CM

## 2015-01-20 LAB — CBC WITH DIFFERENTIAL (CANCER CENTER ONLY)
BASO#: 0 10*3/uL (ref 0.0–0.2)
BASO%: 0.5 % (ref 0.0–2.0)
EOS%: 2.5 % (ref 0.0–7.0)
Eosinophils Absolute: 0.1 10*3/uL (ref 0.0–0.5)
HCT: 43.3 % (ref 34.8–46.6)
HGB: 14.4 g/dL (ref 11.6–15.9)
LYMPH#: 1.6 10*3/uL (ref 0.9–3.3)
LYMPH%: 28.6 % (ref 14.0–48.0)
MCH: 31.9 pg (ref 26.0–34.0)
MCHC: 33.3 g/dL (ref 32.0–36.0)
MCV: 96 fL (ref 81–101)
MONO#: 0.5 10*3/uL (ref 0.1–0.9)
MONO%: 8 % (ref 0.0–13.0)
NEUT#: 3.4 10*3/uL (ref 1.5–6.5)
NEUT%: 60.4 % (ref 39.6–80.0)
Platelets: 137 10*3/uL — ABNORMAL LOW (ref 145–400)
RBC: 4.51 10*6/uL (ref 3.70–5.32)
RDW: 12.2 % (ref 11.1–15.7)
WBC: 5.6 10*3/uL (ref 3.9–10.0)

## 2015-01-20 LAB — CMP (CANCER CENTER ONLY)
ALT(SGPT): 43 U/L (ref 10–47)
AST: 46 U/L — ABNORMAL HIGH (ref 11–38)
Albumin: 3.8 g/dL (ref 3.3–5.5)
Alkaline Phosphatase: 40 U/L (ref 26–84)
BUN, Bld: 9 mg/dL (ref 7–22)
CO2: 26 mEq/L (ref 18–33)
Calcium: 9.1 mg/dL (ref 8.0–10.3)
Chloride: 105 mEq/L (ref 98–108)
Creat: 0.8 mg/dl (ref 0.6–1.2)
Glucose, Bld: 124 mg/dL — ABNORMAL HIGH (ref 73–118)
Potassium: 4.5 mEq/L (ref 3.3–4.7)
Sodium: 143 mEq/L (ref 128–145)
Total Bilirubin: 0.6 mg/dl (ref 0.20–1.60)
Total Protein: 7 g/dL (ref 6.4–8.1)

## 2015-01-20 NOTE — Progress Notes (Signed)
Hematology and Oncology Follow Up Visit  Michelle Long [id number] Jun 07, 1946 69 y.o. 01/20/2015   Principle Diagnosis:   Stage IIA (T2 N0M0) infiltrating ductal carcinoma of the left breast  Current Therapy:    Tamoxifen 20 mg by mouth daily    Interim History:  Michelle Long is here today for a follow-up. She is doing well on her Tamoxifen. No problems.  She had an increase in her LFTs and has been followed by GI. She gets an US of the liver every 6 months. She states that her LFTs have started to improve.  Her mammogram in August was negative.  She has had no problem with infections. No fever, chills, n/v, cough, rash, dizziness, chest pain, palpitations, abdominal pain, constipation, diarrhea, blood in urine or stool. No swelling, tenderness, numbness or tingling in her extremities. No new aches or pains. She states that she had a small fatty cyst in her right groin. I was unable to palpate this. She is going to watch and notify her PCP if it returns.  She does have some SOB due to asthma at times of exertion.   Her appetite is good and she is staying hydrated.   Medications:    Medication List       This list is accurate as of: 01/20/15  1:12 PM.  Always use your most recent med list.               albuterol 108 (90 BASE) MCG/ACT inhaler  Commonly known as:  PROAIR HFA  Inhale 2 puffs into the lungs every 6 (six) hours as needed for wheezing or shortness of breath.     aspirin 81 MG tablet  Take 81 mg by mouth daily.     CALCIUM-MAGNESIUM-ZINC PO  Take 2 tablets by mouth daily. 1000 mg total     citalopram 10 MG tablet  Commonly known as:  CELEXA  Take 0.5 tablets (5 mg total) by mouth at bedtime. Taking 5 mg. Only.     fish oil-omega-3 fatty acids 1000 MG capsule  Take 1 g by mouth daily.     LORazepam 0.5 MG tablet  Commonly known as:  ATIVAN  Take 1 tablet (0.5 mg total) by mouth 2 (two) times daily as needed.     losartan 50 MG tablet  Commonly known as:   COZAAR  TAKE 1 TABLET EVERY DAY     metFORMIN 500 MG tablet  Commonly known as:  GLUCOPHAGE  TAKE 1 TABLET TWICE DAILY WITH MEALS     OLANZapine 5 MG tablet  Commonly known as:  ZYPREXA  Take 5 mg by mouth daily.     pantoprazole 40 MG tablet  Commonly known as:  PROTONIX  TAKE 1 TABLET (40 MG TOTAL) BY MOUTH DAILY.     SYMBICORT 160-4.5 MCG/ACT inhaler  Generic drug:  budesonide-formoterol  INHALE  2 PUFFS  TWICE A DAY     tamoxifen 20 MG tablet  Commonly known as:  NOLVADEX  TAKE 1 TABLET (20 MG TOTAL) BY MOUTH DAILY.     TRUE METRIX AIR GLUCOSE METER Devi     TRUE METRIX BLOOD GLUCOSE TEST test strip  Generic drug:  glucose blood     Vitamin D 1000 UNITS capsule  Take 3,000 Units by mouth daily.        Allergies:  Allergies  Allergen Reactions  . Lamictal [Lamotrigine]     itch  . Prozac [Fluoxetine Hcl]     seizure  . Sulfa  Drugs Cross Reactors     Dyspnea     Past Medical History, Surgical history, Social history, and Family History were reviewed and updated.  Review of Systems: All other 10 point review of systems is negative.   Physical Exam:  height is 5\' 4"  (1.626 m) and weight is 154 lb (69.854 kg). Her oral temperature is 97.5 F (36.4 C). Her blood pressure is 136/92 and her pulse is 97. Her respiration is 14.   Wt Readings from Last 3 Encounters:  01/20/15 154 lb (69.854 kg)  09/28/14 161 lb 2 oz (73.086 kg)  08/24/14 160 lb (72.576 kg)    Ocular: Sclerae unicteric, pupils equal, round and reactive to light Ear-nose-throat: Oropharynx clear, dentition fair Lymphatic: No cervical or supraclavicular adenopathy Lungs no rales or rhonchi, good excursion bilaterally Heart regular rate and rhythm, no murmur appreciated Abd soft, nontender, positive bowel sounds MSK no focal spinal tenderness, no joint edema Neuro: non-focal, well-oriented, appropriate affect Breasts: No changes. No mass, lesion, rash or lymphadenopathy.   Lab Results    Component Value Date   WBC 5.6 01/20/2015   HGB 14.4 01/20/2015   HCT 43.3 01/20/2015   MCV 96 01/20/2015   PLT 137* 01/20/2015   No results found for: FERRITIN, IRON, TIBC, UIBC, IRONPCTSAT Lab Results  Component Value Date   RBC 4.51 01/20/2015   No results found for: KPAFRELGTCHN, LAMBDASER, KAPLAMBRATIO No results found for: IGGSERUM, IGA, IGMSERUM No results found for: Odetta Pink, SPEI   Chemistry      Component Value Date/Time   NA 138 09/28/2014 1044   K 4.5 09/28/2014 1044   CL 102 09/28/2014 1044   CO2 28 09/28/2014 1044   BUN 12 09/28/2014 1044   CREATININE 0.8 09/28/2014 1044      Component Value Date/Time   CALCIUM 9.0 09/28/2014 1044   ALKPHOS 50 09/28/2014 1044   AST 76* 09/28/2014 1044   ALT 58* 09/28/2014 1044   BILITOT 0.6 09/28/2014 1044     Impression and Plan: Michelle Long is a 69 year old postmenopausal female.with history of stage IIA ductal carcinoma left breast. Her breast cancer is triple positive. She' has been on Tamoxifen for almost 9 years now with the plan of completing a total of 10 years. She has shown no evidence of recurrence. Her mammogram in August was negative.  Her LFTs continue to improve. She is followed closely by GI.  We will see her back in 6 months for labs and follow-up.  She knows to call here with any questions or concerns. We can certainly see her sooner if need be.   Eliezer Bottom, NP 4/1/20161:12 PM

## 2015-01-27 ENCOUNTER — Telehealth: Payer: Self-pay | Admitting: Hematology & Oncology

## 2015-01-27 NOTE — Telephone Encounter (Signed)
Michelle Long: [id number] Requesting Provider: Dr. Annye Asa Treating Provider: Dr. Burney Gauze Visits: 6 Status: Approved Dates: 01/20/2015 - 07/22/2015     COPY SCANNED

## 2015-01-30 ENCOUNTER — Encounter: Payer: Self-pay | Admitting: Family Medicine

## 2015-01-30 ENCOUNTER — Ambulatory Visit (INDEPENDENT_AMBULATORY_CARE_PROVIDER_SITE_OTHER): Payer: Commercial Managed Care - HMO | Admitting: Family Medicine

## 2015-01-30 VITALS — BP 126/84 | HR 92 | Temp 98.1°F | Resp 16 | Wt 157.2 lb

## 2015-01-30 DIAGNOSIS — E1165 Type 2 diabetes mellitus with hyperglycemia: Secondary | ICD-10-CM

## 2015-01-30 DIAGNOSIS — Z23 Encounter for immunization: Secondary | ICD-10-CM | POA: Diagnosis not present

## 2015-01-30 DIAGNOSIS — E119 Type 2 diabetes mellitus without complications: Secondary | ICD-10-CM | POA: Diagnosis not present

## 2015-01-30 DIAGNOSIS — E785 Hyperlipidemia, unspecified: Secondary | ICD-10-CM

## 2015-01-30 LAB — LIPID PANEL
Cholesterol: 193 mg/dL (ref 0–200)
HDL: 41.3 mg/dL (ref >39.00)
LDL Cholesterol: 126 mg/dL — ABNORMAL HIGH (ref 0–99)
NonHDL: 151.7
Total CHOL/HDL Ratio: 5
Triglycerides: 130 mg/dL (ref 0.0–149.0)
VLDL: 26 mg/dL (ref 0.0–40.0)

## 2015-01-30 LAB — HEPATIC FUNCTION PANEL
ALT: 41 U/L — ABNORMAL HIGH (ref 0–35)
AST: 48 U/L — ABNORMAL HIGH (ref 0–37)
Albumin: 3.9 g/dL (ref 3.5–5.2)
Alkaline Phosphatase: 51 U/L (ref 39–117)
Bilirubin, Direct: 0.1 mg/dL (ref 0.0–0.3)
Total Bilirubin: 0.3 mg/dL (ref 0.2–1.2)
Total Protein: 6.4 g/dL (ref 6.0–8.3)

## 2015-01-30 LAB — BASIC METABOLIC PANEL
BUN: 11 mg/dL (ref 6–23)
CO2: 29 mEq/L (ref 19–32)
Calcium: 9.3 mg/dL (ref 8.4–10.5)
Chloride: 107 mEq/L (ref 96–112)
Creatinine, Ser: 0.7 mg/dL (ref 0.40–1.20)
GFR: 88.23 mL/min (ref >60.00)
Glucose, Bld: 141 mg/dL — ABNORMAL HIGH (ref 70–99)
Potassium: 4.5 mEq/L (ref 3.5–5.1)
Sodium: 143 mEq/L (ref 135–145)

## 2015-01-30 LAB — HEMOGLOBIN A1C: Hgb A1c MFr Bld: 6.7 % — ABNORMAL HIGH (ref 4.6–6.5)

## 2015-01-30 LAB — TSH: TSH: 0.8 u[IU]/mL (ref 0.35–4.50)

## 2015-01-30 NOTE — Assessment & Plan Note (Signed)
Chronic problem.  Pt has been statin intolerant in the past and is attempting to control w/ fish oil and dietary changes.  Check labs.  Adjust tx prn.

## 2015-01-30 NOTE — Progress Notes (Signed)
Pre visit review using our clinic review tool, if applicable. No additional management support is needed unless otherwise documented below in the visit note. 

## 2015-01-30 NOTE — Assessment & Plan Note (Signed)
Chronic problem.  Currently on Metformin.  On ARB for renal protection.  UTD on eye exam.  Asymptomatic.  Check labs.  Adjust meds prn

## 2015-01-30 NOTE — Progress Notes (Signed)
   Subjective:    Patient ID: Michelle Long, female    DOB: Apr 07, 1946, 69 y.o.   MRN: [id number]  HPI DM- chronic problem, on Metformin BID.  On ARB for renal protection.  Pt reports CBGs have been well controlled.  Has lost weight recently- at least 5 lbs.  Denies symptomatic lows.  UTD on eye exam.  No CP, SOB, HAs, visual changes, weakness/numbness of hands/feet.    Hyperlipidemia- chronic problem, not currently on statin.  Taking Fish oil daily.  No abd pain, N/V.   Review of Systems For ROS see HPI     Objective:   Physical Exam  Constitutional: She is oriented to person, place, and time. She appears well-developed and well-nourished. No distress.  HENT:  Head: Normocephalic and atraumatic.  Eyes: Conjunctivae and EOM are normal. Pupils are equal, round, and reactive to light.  Neck: Normal range of motion. Neck supple. No thyromegaly present.  Cardiovascular: Normal rate, regular rhythm, normal heart sounds and intact distal pulses.   No murmur heard. Pulmonary/Chest: Effort normal and breath sounds normal. No respiratory distress.  Abdominal: Soft. She exhibits no distension. There is no tenderness.  Musculoskeletal: She exhibits no edema.  Lymphadenopathy:    She has no cervical adenopathy.  Neurological: She is alert and oriented to person, place, and time.  Skin: Skin is warm and dry.  Psychiatric: She has a normal mood and affect. Her behavior is normal.  Vitals reviewed.         Assessment & Plan:

## 2015-01-30 NOTE — Patient Instructions (Signed)
Follow up in 3-4 months to recheck diabetes We'll notify you of your lab results and make any changes if needed Keep up the good work on healthy diet and regular exercise- you look great! Call with any questions or concerns Happy Spring!

## 2015-02-01 ENCOUNTER — Encounter: Payer: Self-pay | Admitting: Family Medicine

## 2015-02-17 ENCOUNTER — Ambulatory Visit (INDEPENDENT_AMBULATORY_CARE_PROVIDER_SITE_OTHER): Payer: Commercial Managed Care - HMO | Admitting: Family Medicine

## 2015-02-17 ENCOUNTER — Encounter: Payer: Self-pay | Admitting: Family Medicine

## 2015-02-17 VITALS — BP 140/80 | HR 93 | Temp 97.9°F | Resp 16 | Wt 157.4 lb

## 2015-02-17 DIAGNOSIS — L259 Unspecified contact dermatitis, unspecified cause: Secondary | ICD-10-CM | POA: Insufficient documentation

## 2015-02-17 MED ORDER — METHYLPREDNISOLONE ACETATE 40 MG/ML IJ SUSP
40.0000 mg | Freq: Once | INTRAMUSCULAR | Status: AC
  Administered 2015-02-17: 40 mg via INTRAMUSCULAR

## 2015-02-17 MED ORDER — PREDNISONE 10 MG PO TABS: ORAL_TABLET | ORAL | Status: DC

## 2015-02-17 MED ORDER — TRIAMCINOLONE ACETONIDE 0.1 % EX OINT: 1.0000 "application " | TOPICAL_OINTMENT | Freq: Two times a day (BID) | CUTANEOUS | Status: DC

## 2015-02-17 NOTE — Progress Notes (Signed)
   Subjective:    Patient ID: Michelle Long, female    DOB: 17-Jun-1946, 69 y.o.   MRN: [id number]  HPI Poison ivy- rash appeared 1 week ago while doing yard work.  Daughter w/ similar symptoms.  Using calamine w/o relief.  Very itchy.  sxs are diffusely spread on arms, legs, hands.  Pt w/ DM   Review of Systems For ROS see HPI     Objective:   Physical Exam  Constitutional: She is oriented to person, place, and time. She appears well-developed and well-nourished. No distress.  HENT:  Head: Normocephalic and atraumatic.  Eyes: Conjunctivae and EOM are normal. Pupils are equal, round, and reactive to light.  Musculoskeletal: She exhibits no edema or tenderness.  Neurological: She is alert and oriented to person, place, and time.  Skin: Skin is warm and dry. Rash (widespread vesicular rash on erythematous base in linear patterns on arms, legs, and hands) noted.  No evidence of secondary infxn  Psychiatric: She has a normal mood and affect. Her behavior is normal. Thought content normal.  Vitals reviewed.         Assessment & Plan:

## 2015-02-17 NOTE — Patient Instructions (Signed)
Follow up as needed Start the Prednisone tomorrow morning- take w/ food Use the triamcinolone ointment twice daily on the itchy spots Make sure you watch your carb intake b/c the steroids will cause your sugar to go up Call with any questions or concerns Hang in there!!!

## 2015-02-17 NOTE — Progress Notes (Signed)
Pre visit review using our clinic review tool, if applicable. No additional management support is needed unless otherwise documented below in the visit note. 

## 2015-02-19 ENCOUNTER — Other Ambulatory Visit: Payer: Self-pay | Admitting: Family Medicine

## 2015-02-19 NOTE — Assessment & Plan Note (Signed)
New.  Discussed dx and treatment options.  Due to the widespread nature of pt's dermatitis and the severity of itching, will give depomedrol injxn in office today and start pred taper tomorrow.  Stressed the need to closely monitor sugars in setting of diabetes and the need to really limit carbs as prednisone will cause sugars to go up.  Triamcinolone ointment topically prn.  Pt expressed understanding and is in agreement w/ plan.

## 2015-02-20 ENCOUNTER — Other Ambulatory Visit: Payer: Self-pay | Admitting: Family Medicine

## 2015-02-20 NOTE — Telephone Encounter (Signed)
Med filled.  

## 2015-02-20 NOTE — Telephone Encounter (Signed)
Med denied, pt has diabetes. This wsa only to help stop itching for poison ivy.

## 2015-03-01 ENCOUNTER — Ambulatory Visit: Payer: Commercial Managed Care - HMO | Admitting: Podiatry

## 2015-03-03 ENCOUNTER — Ambulatory Visit (INDEPENDENT_AMBULATORY_CARE_PROVIDER_SITE_OTHER): Payer: Commercial Managed Care - HMO | Admitting: Podiatry

## 2015-03-03 ENCOUNTER — Encounter: Payer: Self-pay | Admitting: Podiatry

## 2015-03-03 DIAGNOSIS — B351 Tinea unguium: Secondary | ICD-10-CM

## 2015-03-03 DIAGNOSIS — Q828 Other specified congenital malformations of skin: Secondary | ICD-10-CM

## 2015-03-03 DIAGNOSIS — M79676 Pain in unspecified toe(s): Secondary | ICD-10-CM | POA: Diagnosis not present

## 2015-03-06 NOTE — Progress Notes (Signed)
Patient ID: Michelle Long, female   DOB: 1946/04/23, 68 y.o.   MRN: [id number]  Subjective: Ms. Clewis presents the office todayfor painful elongated toenails as well as symptomatic calluses to bilateral feet.. She denies any recent redness or drainage from the nail sites and no other complaints at this time. No acute changes since last appointment.  Objective: AAO 3, NAD DP/PT pulses palpable bilaterally, CRT less than 3 seconds Protective sensation intact with Simms Weinstein monofilament, vibratory sensation intact, Achilles tendon reflex intact. Hyperkeratotic lesions submetatarsal one and 5 bilaterally as well as medial hallux bilaterally. Upon debridement of these lesions no underlying ulceration there is no clinical signs of infection. Nails are dystrophic, discolored, elongated, mildly hypertrophic. There is no surrounding erythema or drainage from the nail sites.Tenderness to nails 1-5 bialaterally.  No other open lesions or pre-ulcerative lesions identified. No interdigital maceration. No other areas of tenderness to bilateral lower extremity's. No overlying edema, erythema, increase in warmth bilaterally. No pain with calf compression, swelling, warmth, erythema.  Assessment: 69 year old female with symptomatic hyperkeratotic lesions, onychomycosis  Plan: -Treatment options were discussed with the patient including alternatives, risks, complications. -Nail sharply debrided 10 without complication/bleeding. -Hyperkeratotic lesion sharply debrided 4 without complication/bleeding. -Discussed the importance daily foot inspection. -Follow-up in 3 months or sooner if any problems are to arise. In the meantime, encouraged call the office with any questions, concerns, changes symptoms.

## 2015-05-08 ENCOUNTER — Other Ambulatory Visit: Payer: Self-pay

## 2015-05-08 DIAGNOSIS — Z1231 Encounter for screening mammogram for malignant neoplasm of breast: Secondary | ICD-10-CM

## 2015-06-01 ENCOUNTER — Ambulatory Visit (INDEPENDENT_AMBULATORY_CARE_PROVIDER_SITE_OTHER): Payer: Commercial Managed Care - HMO | Admitting: Family Medicine

## 2015-06-01 ENCOUNTER — Encounter: Payer: Self-pay | Admitting: Family Medicine

## 2015-06-01 VITALS — BP 130/80 | HR 89 | Temp 98.0°F | Resp 16 | Ht 64.0 in | Wt 157.1 lb

## 2015-06-01 DIAGNOSIS — E119 Type 2 diabetes mellitus without complications: Secondary | ICD-10-CM

## 2015-06-01 DIAGNOSIS — E1165 Type 2 diabetes mellitus with hyperglycemia: Secondary | ICD-10-CM

## 2015-06-01 LAB — BASIC METABOLIC PANEL
BUN: 12 mg/dL (ref 6–23)
CO2: 29 mEq/L (ref 19–32)
Calcium: 9 mg/dL (ref 8.4–10.5)
Chloride: 105 mEq/L (ref 96–112)
Creatinine, Ser: 0.74 mg/dL (ref 0.40–1.20)
GFR: 82.67 mL/min (ref >60.00)
Glucose, Bld: 129 mg/dL — ABNORMAL HIGH (ref 70–99)
Potassium: 4 mEq/L (ref 3.5–5.1)
Sodium: 143 mEq/L (ref 135–145)

## 2015-06-01 LAB — HEMOGLOBIN A1C: Hgb A1c MFr Bld: 6.4 % (ref 4.6–6.5)

## 2015-06-01 MED ORDER — BUDESONIDE-FORMOTEROL FUMARATE 160-4.5 MCG/ACT IN AERO: INHALATION_SPRAY | RESPIRATORY_TRACT | Status: DC

## 2015-06-01 MED ORDER — TAMOXIFEN CITRATE 20 MG PO TABS: ORAL_TABLET | ORAL | Status: DC

## 2015-06-01 MED ORDER — ALBUTEROL SULFATE HFA 108 (90 BASE) MCG/ACT IN AERS: 2.0000 | INHALATION_SPRAY | Freq: Four times a day (QID) | RESPIRATORY_TRACT | Status: DC | PRN

## 2015-06-01 MED ORDER — LOSARTAN POTASSIUM 50 MG PO TABS: 50.0000 mg | ORAL_TABLET | Freq: Every day | ORAL | Status: DC

## 2015-06-01 NOTE — Patient Instructions (Signed)
Schedule your complete physical in 3-4 months We'll notify you of your lab results and make any changes if needed Keep up the good work!  You look great!! Call with any questions or concerns Enjoy the rest of your summer!!!

## 2015-06-01 NOTE — Progress Notes (Signed)
Pre visit review using our clinic review tool, if applicable. No additional management support is needed unless otherwise documented below in the visit note. 

## 2015-06-01 NOTE — Progress Notes (Signed)
   Subjective:    Patient ID: Michelle Long, female    DOB: 26-Nov-1945, 69 y.o.   MRN: [id number]  HPI DM- chronic problem, on Metformin twice daily.  On ARB for renal protection.  Eye exam is due in Sept- has appt scheduled.  Home CBGs running 'right around 100 in the AM'.  Denies symptomatic lows.  No CP, SOB, HAs, visual changes, edema, abd pain, N/V, numbness/tingling.   Review of Systems For ROS see HPI     Objective:   Physical Exam  Constitutional: She is oriented to person, place, and time. She appears well-developed and well-nourished. No distress.  HENT:  Head: Normocephalic and atraumatic.  Eyes: Conjunctivae and EOM are normal. Pupils are equal, round, and reactive to light.  Neck: Normal range of motion. Neck supple. No thyromegaly present.  Cardiovascular: Normal rate, regular rhythm, normal heart sounds and intact distal pulses.   No murmur heard. Pulmonary/Chest: Effort normal and breath sounds normal. No respiratory distress.  Abdominal: Soft. She exhibits no distension. There is no tenderness.  Musculoskeletal: She exhibits no edema.  Lymphadenopathy:    She has no cervical adenopathy.  Neurological: She is alert and oriented to person, place, and time.  Skin: Skin is warm and dry.  Psychiatric: She has a normal mood and affect. Her behavior is normal.  Vitals reviewed.         Assessment & Plan:

## 2015-06-01 NOTE — Assessment & Plan Note (Signed)
Chronic problem.  Tolerating metformin w/o difficulty.  Has appt scheduled for eye exam.  On ARB for renal protection.  Check labs.  Adjust meds prn

## 2015-06-06 ENCOUNTER — Other Ambulatory Visit: Payer: Self-pay | Admitting: Family Medicine

## 2015-06-06 NOTE — Telephone Encounter (Signed)
Medication filled to pharmacy as requested.   

## 2015-06-09 ENCOUNTER — Ambulatory Visit (INDEPENDENT_AMBULATORY_CARE_PROVIDER_SITE_OTHER): Payer: Commercial Managed Care - HMO | Admitting: Podiatry

## 2015-06-09 ENCOUNTER — Encounter: Payer: Self-pay | Admitting: Podiatry

## 2015-06-09 VITALS — BP 121/68 | HR 94

## 2015-06-09 DIAGNOSIS — M79676 Pain in unspecified toe(s): Secondary | ICD-10-CM | POA: Diagnosis not present

## 2015-06-09 DIAGNOSIS — B351 Tinea unguium: Secondary | ICD-10-CM

## 2015-06-09 DIAGNOSIS — Q828 Other specified congenital malformations of skin: Secondary | ICD-10-CM | POA: Diagnosis not present

## 2015-06-13 ENCOUNTER — Ambulatory Visit
Admission: RE | Admit: 2015-06-13 | Discharge: 2015-06-13 | Disposition: A | Discharge status: Home / Self Care | Payer: Commercial Managed Care - HMO | Source: Ambulatory Visit

## 2015-06-13 DIAGNOSIS — Z1231 Encounter for screening mammogram for malignant neoplasm of breast: Secondary | ICD-10-CM

## 2015-06-13 DIAGNOSIS — F250 Schizoaffective disorder, bipolar type: Secondary | ICD-10-CM | POA: Diagnosis not present

## 2015-06-15 NOTE — Progress Notes (Signed)
Patient ID: NAVREET BOLDA, female   DOB: 11-Aug-1946, 69 y.o.   MRN: [id number]  Subjective: 69 y.o. returns the office today for painful, elongated, thickened toenails and calluses that she cannot trim herself. Denies any redness or drainage around the nails/calluses. Denies any acute changes since last appointment and no new complaints today. Denies any systemic complaints such as fevers, chills, nausea, vomiting.   Objective: AAO 3, NAD DP/PT pulses palpable, CRT less than 3 seconds Nails hypertrophic, dystrophic, elongated, brittle, discolored 10. There is tenderness overlying the nails 1-5 bilaterally. There is no surrounding erythema or drainage along the nail sites. Hyperkertotic lesions bilateral submetarsal 1 and 5. Upon debridement, no underlying ulceration, drainage, or other signs of infection.  No open lesions or pre-ulcerative lesions are identified. No other areas of tenderness bilateral lower extremities. No overlying edema, erythema, increased warmth. No pain with calf compression, swelling, warmth, erythema.  Assessment: Patient presents with symptomatic onychomycosis; hyperkertoic lesions   Plan: -Treatment options including alternatives, risks, complications were discussed -Nails sharply debrided 10 without complication/bleeding. -Hyperkerotic lesions sharply debrided x 4 without complications/bleeding.  -Discussed daily foot inspection. If there are any changes, to call the office immediately.  -Follow-up in 3 months or sooner if any problems are to arise. In the meantime, encouraged to call the office with any questions, concerns, changes symptoms.  Celesta Gentile, DPM

## 2015-06-19 DIAGNOSIS — K219 Gastro-esophageal reflux disease without esophagitis: Secondary | ICD-10-CM | POA: Diagnosis not present

## 2015-06-19 DIAGNOSIS — R748 Abnormal levels of other serum enzymes: Secondary | ICD-10-CM | POA: Diagnosis not present

## 2015-06-27 ENCOUNTER — Other Ambulatory Visit: Payer: Self-pay | Admitting: Family Medicine

## 2015-06-28 NOTE — Telephone Encounter (Signed)
Medication filled to pharmacy as requested.   

## 2015-07-05 DIAGNOSIS — R748 Abnormal levels of other serum enzymes: Secondary | ICD-10-CM | POA: Diagnosis not present

## 2015-07-14 DIAGNOSIS — H5210 Myopia, unspecified eye: Secondary | ICD-10-CM | POA: Diagnosis not present

## 2015-07-14 DIAGNOSIS — H524 Presbyopia: Secondary | ICD-10-CM | POA: Diagnosis not present

## 2015-07-14 LAB — HM DIABETES EYE EXAM

## 2015-07-20 ENCOUNTER — Telehealth: Payer: Self-pay | Admitting: Hematology & Oncology

## 2015-07-20 ENCOUNTER — Encounter: Payer: Self-pay | Admitting: Hematology & Oncology

## 2015-07-20 ENCOUNTER — Ambulatory Visit (HOSPITAL_BASED_OUTPATIENT_CLINIC_OR_DEPARTMENT_OTHER): Payer: Commercial Managed Care - HMO | Admitting: Hematology & Oncology

## 2015-07-20 ENCOUNTER — Telehealth: Payer: Self-pay | Admitting: Family Medicine

## 2015-07-20 ENCOUNTER — Other Ambulatory Visit (HOSPITAL_BASED_OUTPATIENT_CLINIC_OR_DEPARTMENT_OTHER): Payer: Commercial Managed Care - HMO

## 2015-07-20 VITALS — BP 119/68 | HR 96 | Temp 97.4°F | Resp 16 | Ht 64.0 in | Wt 156.0 lb

## 2015-07-20 DIAGNOSIS — E559 Vitamin D deficiency, unspecified: Secondary | ICD-10-CM | POA: Diagnosis not present

## 2015-07-20 DIAGNOSIS — C50312 Malignant neoplasm of lower-inner quadrant of left female breast: Secondary | ICD-10-CM

## 2015-07-20 DIAGNOSIS — C679 Malignant neoplasm of bladder, unspecified: Secondary | ICD-10-CM

## 2015-07-20 LAB — COMPREHENSIVE METABOLIC PANEL (CC13)
ALT: 63 U/L — ABNORMAL HIGH (ref 0–55)
AST: 70 U/L — ABNORMAL HIGH (ref 5–34)
Albumin: 4 g/dL (ref 3.5–5.0)
Alkaline Phosphatase: 46 U/L (ref 40–150)
Anion Gap: 7 mEq/L (ref 3–11)
BUN: 10 mg/dL (ref 7.0–26.0)
CO2: 28 mEq/L (ref 22–29)
Calcium: 9.2 mg/dL (ref 8.4–10.4)
Chloride: 108 mEq/L (ref 98–109)
Creatinine: 0.8 mg/dL (ref 0.6–1.1)
EGFR: 73 mL/min/{1.73_m2} — ABNORMAL LOW (ref >90)
Glucose: 134 mg/dl (ref 70–140)
Potassium: 4.2 mEq/L (ref 3.5–5.1)
Sodium: 143 mEq/L (ref 136–145)
Total Bilirubin: 0.42 mg/dL (ref 0.20–1.20)
Total Protein: 6.8 g/dL (ref 6.4–8.3)

## 2015-07-20 LAB — CBC WITH DIFFERENTIAL (CANCER CENTER ONLY)
BASO#: 0 10*3/uL (ref 0.0–0.2)
BASO%: 0.4 % (ref 0.0–2.0)
EOS%: 2.5 % (ref 0.0–7.0)
Eosinophils Absolute: 0.1 10*3/uL (ref 0.0–0.5)
HCT: 43.5 % (ref 34.8–46.6)
HGB: 14.3 g/dL (ref 11.6–15.9)
LYMPH#: 1.5 10*3/uL (ref 0.9–3.3)
LYMPH%: 26.1 % (ref 14.0–48.0)
MCH: 31.4 pg (ref 26.0–34.0)
MCHC: 32.9 g/dL (ref 32.0–36.0)
MCV: 96 fL (ref 81–101)
MONO#: 0.4 10*3/uL (ref 0.1–0.9)
MONO%: 7 % (ref 0.0–13.0)
NEUT#: 3.6 10*3/uL (ref 1.5–6.5)
NEUT%: 64 % (ref 39.6–80.0)
Platelets: 154 10*3/uL (ref 145–400)
RBC: 4.55 10*6/uL (ref 3.70–5.32)
RDW: 12.3 % (ref 11.1–15.7)
WBC: 5.7 10*3/uL (ref 3.9–10.0)

## 2015-07-20 NOTE — Telephone Encounter (Signed)
Referrals placed 

## 2015-07-20 NOTE — Telephone Encounter (Signed)
Pt went to Dr. Marin Olp today 07/20/15 at the [facility] and forgot she needed referral/auth. She asked if we can still get it. She has appt Monday 07/24/15 with [facility] Lillette Boxer. Dahlstedt, M. D.  And needs referral/auth.

## 2015-07-20 NOTE — Telephone Encounter (Signed)
Called patient's home #. L/m stating patient's appt for March 2017.       [facility].

## 2015-07-20 NOTE — Progress Notes (Signed)
Hematology and Oncology Follow Up Visit  RAJVI ARMENTOR [id number] [birth year] 69 y.o. 07/20/2015   Principle Diagnosis:   Stage IIA (T2 N0M0) infiltrating ductal carcinoma of the left breast  Current Therapy:    Tamoxifen 20 mg by mouth daily - to finish in 2017.     Interim History:  Ms.  Ignasiak is in for followup. We see her every 6 months. She has been doing well. She had a good summer. She's doing well healthwise. She had no problems with her diabetes. She's had no nausea or vomiting. She's had no leg swelling. She had no rashes. As far as her breast cancer is concerned, she is doing well with this. She has been on tamoxifen for 9 years.. She's had no bony pain. She's had no nausea or vomiting. There's been no change in bowel or bladder habits. She's had no issues with the flu this year. She did get a flu shot.  She had her mammogram in August. This looked okay with no evidence of a suspicious microcalcifications.  Medications:  Current outpatient prescriptions:  .  albuterol (PROAIR HFA) 108 (90 BASE) MCG/ACT inhaler, Inhale 2 puffs into the lungs every 6 (six) hours as needed for wheezing or shortness of breath., Disp: 1 Inhaler, Rfl: 3 .  aspirin 81 MG tablet, Take 81 mg by mouth daily., Disp: , Rfl:  .  Blood Glucose Monitoring Suppl (TRUE METRIX AIR GLUCOSE METER) DEVI, , Disp: , Rfl:  .  Calcium Carb-Cholecalciferol (CALCIUM + D3 PO), Take by mouth., Disp: , Rfl:  .  Cholecalciferol (VITAMIN D) 1000 UNITS capsule, Take 3,000 Units by mouth daily. , Disp: , Rfl:  .  citalopram (CELEXA) 10 MG tablet, Take 0.5 tablets (5 mg total) by mouth at bedtime. Taking 5 mg. Only., Disp: 90 tablet, Rfl: 1 .  fish oil-omega-3 fatty acids 1000 MG capsule, Take 1 g by mouth daily.  , Disp: , Rfl:  .  LORazepam (ATIVAN) 0.5 MG tablet, Take 1 tablet (0.5 mg total) by mouth 2 (two) times daily as needed., Disp: 90 tablet, Rfl: 0 .  losartan (COZAAR) 50 MG tablet, Take 1 tablet (50 mg total) by mouth  daily., Disp: 30 tablet, Rfl: 3 .  metFORMIN (GLUCOPHAGE) 500 MG tablet, TAKE 1 TABLET TWICE DAILY WITH MEALS, Disp: 180 tablet, Rfl: 1 .  OLANZapine (ZYPREXA) 5 MG tablet, Take 5 mg by mouth daily., Disp: , Rfl:  .  pantoprazole (PROTONIX) 40 MG tablet, TAKE 1 TABLET EVERY DAY, Disp: 90 tablet, Rfl: 1 .  SYMBICORT 160-4.5 MCG/ACT inhaler, INHALE  2 PUFFS  TWICE A DAY, Disp: 3 Inhaler, Rfl: 1 .  tamoxifen (NOLVADEX) 20 MG tablet, TAKE 1 TABLET (20 MG TOTAL) BY MOUTH DAILY., Disp: 30 tablet, Rfl: 3 .  triamcinolone ointment (KENALOG) 0.1 %, Apply 1 application topically 2 (two) times daily., Disp: 90 g, Rfl: 1 .  TRUE METRIX BLOOD GLUCOSE TEST test strip, , Disp: , Rfl:   Allergies:  Allergies  Allergen Reactions  . Lamictal [Lamotrigine]     itch  . Prozac [Fluoxetine Hcl]     seizure  . Sulfa Drugs Cross Reactors     Dyspnea     Past Medical History, Surgical history, Social history, and Family History were reviewed and updated.  Review of Systems: As above  Physical Exam:  height is 5\' 4"  (1.626 m) and weight is 156 lb (70.761 kg). Her oral temperature is 97.4 F (36.3 C). Her blood pressure is 119/68 and  her pulse is 96. Her respiration is 16.   Well-developed well-nourished white female. She is no ocular or oral lesions. There is no palpable cervical or supraclavicular lymph nodes. Lungs are clear folic. There are no rales wheezes or rhonchi. Cardiac exam regular rate and rhythm with no murmurs rubs or bruits. Breast exam shows right breast no masses edema or erythema. There is no right axillary adenopathy. Left breast shows a well-healed lumpectomy at about the 6:00 position. There is some contraction of the left breast. There's no mass. There is no left axillary adenopathy. Abdomen is soft. She has good bowel sounds. There is no fluid wave. There is no palpable hepato- splenomegaly back exam no tenderness over the spine ribs or hips. She has no kyphosis. Extremities shows some  osteoarthritic changes. Has good strength. Skin exam no rashes. Neurological exam no focal neurological deficits.  Lab Results  Component Value Date   WBC 5.7 07/20/2015   HGB 14.3 07/20/2015   HCT 43.5 07/20/2015   MCV 96 07/20/2015   PLT 154 07/20/2015     Chemistry      Component Value Date/Time   NA 143 06/01/2015 1127   NA 143 01/20/2015 1240   K 4.0 06/01/2015 1127   K 4.5 01/20/2015 1240   CL 105 06/01/2015 1127   CL 105 01/20/2015 1240   CO2 29 06/01/2015 1127   CO2 26 01/20/2015 1240   BUN 12 06/01/2015 1127   BUN 9 01/20/2015 1240   CREATININE 0.74 06/01/2015 1127   CREATININE 0.8 01/20/2015 1240      Component Value Date/Time   CALCIUM 9.0 06/01/2015 1127   CALCIUM 9.1 01/20/2015 1240   ALKPHOS 51 01/30/2015 1123   ALKPHOS 40 01/20/2015 1240   AST 48* 01/30/2015 1123   AST 46* 01/20/2015 1240   ALT 41* 01/30/2015 1123   ALT 43 01/20/2015 1240   BILITOT 0.3 01/30/2015 1123   BILITOT 0.60 01/20/2015 1240         Impression and Plan: Ms. Wyman is a 69 year old postmenopausal female. She has stage IIA ductal carcinoma left breast. Her breast cancer is triple positive. She underwent lumpectomy followed by chemotherapy and radiation back in 2007.  She's on tamoxifen now. I plan for a total of 10 years of tamoxifen.  I do not see any evidence of recurrence.   She has had some issues with her liver function test. I'm not sure as to why these are elevated but hopefully these will not be a problem.     Volanda Napoleon, MD 9/29/20163:28 PM

## 2015-07-21 ENCOUNTER — Other Ambulatory Visit: Payer: Self-pay | Admitting: General Practice

## 2015-07-24 DIAGNOSIS — C670 Malignant neoplasm of trigone of bladder: Secondary | ICD-10-CM | POA: Diagnosis not present

## 2015-07-25 NOTE — Telephone Encounter (Signed)
Marji done per referrals

## 2015-07-26 ENCOUNTER — Telehealth: Payer: Self-pay | Admitting: Hematology & Oncology

## 2015-07-26 NOTE — Telephone Encounter (Signed)
Michelle Long: [id number] Requesting Provider: Dr. Annye Asa Treating Provider: Dr. Burney Gauze Visits: 6 Status: Approved Dates: 07/20/2015 - 01/16/2016      COPY SCANNED

## 2015-07-28 ENCOUNTER — Other Ambulatory Visit: Payer: Self-pay | Admitting: Family Medicine

## 2015-07-28 NOTE — Telephone Encounter (Signed)
Rx sent to the pharmacy by e-script.//AB/CMA 

## 2015-08-25 ENCOUNTER — Other Ambulatory Visit: Payer: Self-pay | Admitting: Family Medicine

## 2015-08-25 NOTE — Telephone Encounter (Signed)
Referral on Tamoxifen really should come from Dr Marin Olp

## 2015-08-25 NOTE — Telephone Encounter (Signed)
Last OV 06/01/15 Tamoxifen last filled 06/01/15 #30 with 3

## 2015-08-29 ENCOUNTER — Other Ambulatory Visit: Payer: Self-pay | Admitting: Family Medicine

## 2015-08-29 NOTE — Telephone Encounter (Signed)
Medication filled to pharmacy as requested.   

## 2015-09-04 ENCOUNTER — Telehealth: Payer: Self-pay | Admitting: Family Medicine

## 2015-09-04 NOTE — Telephone Encounter (Signed)
Please advise, per pt chart she had regular TD in 2014. Not sure about the bananas for Vomiting and diarrhea

## 2015-09-04 NOTE — Telephone Encounter (Signed)
We can update her Tdap but insurance may not cover it (she can also have it at the pharmacy)  The bananas will not 'help' her vomiting and diarrhea but they are part of the recommended diet when having GI issues b/c they are easy to digest (BRAT- bananas, rice, applesauce, toast and some broth)

## 2015-09-04 NOTE — Telephone Encounter (Signed)
Pt is wanting to know if she's had whooping cough vaccine as her daughter is pregnant and she wants to be up to date on vaccines. Pt is asking if bananas will help with vomitting and diarrhea also as she is experiencing both today.

## 2015-09-04 NOTE — Telephone Encounter (Signed)
Called pt and gave provider recommendations. Stated an understanding and advised that she would call her insurance to find out about the TDAP.

## 2015-09-11 ENCOUNTER — Telehealth: Payer: Self-pay | Admitting: Family Medicine

## 2015-09-11 ENCOUNTER — Ambulatory Visit (INDEPENDENT_AMBULATORY_CARE_PROVIDER_SITE_OTHER): Payer: Commercial Managed Care - HMO | Admitting: Podiatry

## 2015-09-11 DIAGNOSIS — M79676 Pain in unspecified toe(s): Secondary | ICD-10-CM

## 2015-09-11 DIAGNOSIS — B351 Tinea unguium: Secondary | ICD-10-CM | POA: Diagnosis not present

## 2015-09-11 DIAGNOSIS — F3160 Bipolar disorder, current episode mixed, unspecified: Secondary | ICD-10-CM

## 2015-09-11 DIAGNOSIS — Q828 Other specified congenital malformations of skin: Secondary | ICD-10-CM

## 2015-09-11 NOTE — Progress Notes (Signed)
Patient ID: Michelle Long, female   DOB: 04/22/1946, 69 y.o.   MRN: [id number]  Subjective: 69 y.o. returns the office today for painful, elongated, thickened toenails and calluses that she cannot trim herself. Denies any redness or drainage around the nails/calluses. Denies any acute changes since last appointment and no new complaints today. Denies any systemic complaints such as fevers, chills, nausea, vomiting.   Objective: AAO 3, NAD DP/PT pulses palpable, CRT less than 3 seconds Nails hypertrophic, dystrophic, elongated, brittle, discolored 10. There is tenderness overlying the nails 1-5 bilaterally. There is no surrounding erythema or drainage along the nail sites. Hyperkertotic lesions bilateral submetarsal 1 and 5. Upon debridement, no underlying ulceration, drainage, or other signs of infection.  No open lesions or pre-ulcerative lesions are identified. No other areas of tenderness bilateral lower extremities. No overlying edema, erythema, increased warmth. No pain with calf compression, swelling, warmth, erythema.  Assessment: Patient presents with symptomatic onychomycosis; hyperkertoic lesions   Plan: -Treatment options including alternatives, risks, complications were discussed -Nails sharply debrided 10 without complication/bleeding. -Hyperkerotic lesions sharply debrided x 4 without complications/bleeding.  -Discussed daily foot inspection. If there are any changes, to call the office immediately.  -Follow-up in 3 months or sooner if any problems are to arise. In the meantime, encouraged to call the office with any questions, concerns, changes symptoms.  Geneieve Duell, DPM  

## 2015-09-11 NOTE — Telephone Encounter (Signed)
Pt has appt 12/07/15 at 1:10pm with Dr. Reece Levy (psychiatrist). She is wanting to make sure referral is done.

## 2015-09-11 NOTE — Telephone Encounter (Signed)
Awaiting insurance auth, pt aware

## 2015-09-11 NOTE — Telephone Encounter (Signed)
Referral placed.

## 2015-11-08 ENCOUNTER — Other Ambulatory Visit: Payer: Self-pay | Admitting: Family Medicine

## 2015-11-13 ENCOUNTER — Encounter: Payer: Self-pay | Admitting: Family Medicine

## 2015-11-14 ENCOUNTER — Other Ambulatory Visit: Payer: Self-pay | Admitting: Family Medicine

## 2015-11-17 ENCOUNTER — Ambulatory Visit (INDEPENDENT_AMBULATORY_CARE_PROVIDER_SITE_OTHER): Payer: Commercial Managed Care - HMO | Admitting: Family Medicine

## 2015-11-17 ENCOUNTER — Encounter: Payer: Self-pay | Admitting: Family Medicine

## 2015-11-17 VITALS — BP 132/74 | HR 102 | Temp 98.1°F | Ht 64.0 in | Wt 155.8 lb

## 2015-11-17 DIAGNOSIS — Z0000 Encounter for general adult medical examination without abnormal findings: Secondary | ICD-10-CM | POA: Diagnosis not present

## 2015-11-17 DIAGNOSIS — Z1159 Encounter for screening for other viral diseases: Secondary | ICD-10-CM

## 2015-11-17 DIAGNOSIS — E1165 Type 2 diabetes mellitus with hyperglycemia: Secondary | ICD-10-CM

## 2015-11-17 DIAGNOSIS — E785 Hyperlipidemia, unspecified: Secondary | ICD-10-CM | POA: Diagnosis not present

## 2015-11-17 DIAGNOSIS — E1151 Type 2 diabetes mellitus with diabetic peripheral angiopathy without gangrene: Secondary | ICD-10-CM

## 2015-11-17 DIAGNOSIS — M8580 Other specified disorders of bone density and structure, unspecified site: Secondary | ICD-10-CM

## 2015-11-17 DIAGNOSIS — IMO0002 Reserved for concepts with insufficient information to code with codable children: Secondary | ICD-10-CM

## 2015-11-17 LAB — CBC WITH DIFFERENTIAL/PLATELET
Basophils Absolute: 0 10*3/uL (ref 0.0–0.1)
Basophils Relative: 0.4 % (ref 0.0–3.0)
Eosinophils Absolute: 0.1 10*3/uL (ref 0.0–0.7)
Eosinophils Relative: 2.1 % (ref 0.0–5.0)
HCT: 43.8 % (ref 36.0–46.0)
Hemoglobin: 14.2 g/dL (ref 12.0–15.0)
Lymphocytes Relative: 25.2 % (ref 12.0–46.0)
Lymphs Abs: 1.6 10*3/uL (ref 0.7–4.0)
MCHC: 32.5 g/dL (ref 30.0–36.0)
MCV: 94.4 fl (ref 78.0–100.0)
Monocytes Absolute: 0.4 10*3/uL (ref 0.1–1.0)
Monocytes Relative: 6.9 % (ref 3.0–12.0)
Neutro Abs: 4.2 10*3/uL (ref 1.4–7.7)
Neutrophils Relative %: 65.4 % (ref 43.0–77.0)
Platelets: 184 10*3/uL (ref 150.0–400.0)
RBC: 4.64 Mil/uL (ref 3.87–5.11)
RDW: 13.3 % (ref 11.5–15.5)
WBC: 6.4 10*3/uL (ref 4.0–10.5)

## 2015-11-17 LAB — COMPREHENSIVE METABOLIC PANEL
ALT: 48 U/L — ABNORMAL HIGH (ref 0–35)
AST: 50 U/L — ABNORMAL HIGH (ref 0–37)
Albumin: 4.3 g/dL (ref 3.5–5.2)
Alkaline Phosphatase: 44 U/L (ref 39–117)
BUN: 12 mg/dL (ref 6–23)
CO2: 30 mEq/L (ref 19–32)
Calcium: 9.5 mg/dL (ref 8.4–10.5)
Chloride: 104 mEq/L (ref 96–112)
Creatinine, Ser: 0.79 mg/dL (ref 0.40–1.20)
GFR: 76.56 mL/min (ref >60.00)
Glucose, Bld: 119 mg/dL — ABNORMAL HIGH (ref 70–99)
Potassium: 4.1 mEq/L (ref 3.5–5.1)
Sodium: 142 mEq/L (ref 135–145)
Total Bilirubin: 0.3 mg/dL (ref 0.2–1.2)
Total Protein: 6.9 g/dL (ref 6.0–8.3)

## 2015-11-17 LAB — LIPID PANEL
Cholesterol: 173 mg/dL (ref 0–200)
HDL: 35.7 mg/dL — ABNORMAL LOW (ref >39.00)
LDL Cholesterol: 107 mg/dL — ABNORMAL HIGH (ref 0–99)
NonHDL: 136.83
Total CHOL/HDL Ratio: 5
Triglycerides: 149 mg/dL (ref 0.0–149.0)
VLDL: 29.8 mg/dL (ref 0.0–40.0)

## 2015-11-17 LAB — HEMOGLOBIN A1C: Hgb A1c MFr Bld: 6.7 % — ABNORMAL HIGH (ref 4.6–6.5)

## 2015-11-17 NOTE — Progress Notes (Signed)
Pre visit review using our clinic review tool, if applicable. No additional management support is needed unless otherwise documented below in the visit note. 

## 2015-11-17 NOTE — Progress Notes (Signed)
Subjective:Marland Kitchen   Michelle Long is a 70 y.o. female who presents for Medicare Annual (Subsequent) preventive examination.  Review of Systems:   Review of Systems  Constitutional: Negative for activity change, appetite change and fatigue.  HENT: Negative for hearing loss, congestion, tinnitus and ear discharge.   Eyes: Negative for visual disturbance (see optho q1y -- vision corrected to 20/20 with glasses).  Respiratory: Negative for cough, chest tightness and shortness of breath.   Cardiovascular: Negative for chest pain, palpitations and leg swelling.  Gastrointestinal: Negative for abdominal pain, diarrhea, constipation and abdominal distention.  Genitourinary: Negative for urgency, frequency, decreased urine volume and difficulty urinating.  Musculoskeletal: Negative for back pain, arthralgias and gait problem.  Skin: Negative for color change, pallor and rash.  Neurological: Negative for dizziness, light-headedness, numbness and headaches.  Hematological: Negative for adenopathy. Does not bruise/bleed easily.  Psychiatric/Behavioral: Negative for suicidal ideas, confusion, sleep disturbance, self-injury, dysphoric mood, decreased concentration and agitation.  Pt is able to read and write and can do all ADLs No risk for falling No abuse/ violence in home             Objective:     Vitals: BP 132/74 mmHg  Pulse 102  Temp(Src) 98.1 F (36.7 C) (Oral)  Ht '5\' 4"'  (1.626 m)  Wt 155 lb 12.8 oz (70.67 kg)  BMI 26.73 kg/m2  SpO2 95% BP 132/74 mmHg  Pulse 102  Temp(Src) 98.1 F (36.7 C) (Oral)  Ht '5\' 4"'  (1.626 m)  Wt 155 lb 12.8 oz (70.67 kg)  BMI 26.73 kg/m2  SpO2 95% General appearance: alert, cooperative, appears stated age and no distress Head: Normocephalic, without obvious abnormality, atraumatic Eyes: conjunctivae/corneas clear. PERRL, EOM's intact. Fundi benign. Ears: normal TM's and external ear canals both ears Nose: Nares normal. Septum midline. Mucosa  normal. No drainage or sinus tenderness. Throat: lips, mucosa, and tongue normal; teeth and gums normal Neck: no adenopathy, no carotid bruit, no JVD, supple, symmetrical, trachea midline and thyroid not enlarged, symmetric, no tenderness/mass/nodules Back: symmetric, no curvature. ROM normal. No CVA tenderness. Lungs: clear to auscultation bilaterally Breasts: normal appearance, no masses or tenderness Heart: regular rate and rhythm, S1, S2 normal, no murmur, click, rub or gallop Abdomen: soft, non-tender; bowel sounds normal; no masses,  no organomegaly Pelvic: deferred-- s/p TAH Extremities: extremities normal, atraumatic, no cyanosis or edema Pulses: 2+ and symmetric Skin: Skin color, texture, turgor normal. No rashes or lesions Lymph nodes: Cervical, supraclavicular, and axillary nodes normal. Neurologic: Alert and oriented X 3, normal strength and tone. Normal symmetric reflexes. Normal coordination and gait Psych- no depression, no anxiety  Tobacco History  Smoking status  . Former Smoker -- 1.50 packs/day for 33 years  . Types: Cigarettes  . Start date: 12/08/1964  . Quit date: 08/21/1998  Smokeless tobacco  . Not on file    Comment: quit 16 years ago     Counseling given: No   Past Medical History  Diagnosis Date  . Asthma   . Breast cancer ([place])   . Bladder cancer ([place])   . Depression   . Bipolar disorder ([place])   . Diabetes mellitus   . GERD (gastroesophageal reflux disease)   . Hepatitis B infection   . Hyperlipidemia   . History of transfusion of whole blood     Approximately 2009, due to breast cancer/chemo.  . Cancer of lower-inner quadrant of left female breast ([place]) 12/08/2013   Past Surgical History  Procedure Laterality Date  .  Tonsillectomy  1951  . Appendectomy  1961  . Partial hysterectomy  1991  . Bladder cancer  2006  . Breast lumpectomy  2007  . Abdominal hysterectomy     Family History  Problem Relation Age of Onset  . Arthritis Mother     . Breast cancer Mother   . Transient ischemic attack Mother   . Hypertension Mother   . Heart disease Father   . Diabetes Father   . Cancer Father     bladder cancer  . Throat cancer Father    History  Sexual Activity  . Sexual Activity:  . Partners: Male    Outpatient Encounter Prescriptions as of 11/17/2015  Medication Sig  . albuterol (PROAIR HFA) 108 (90 BASE) MCG/ACT inhaler Inhale 2 puffs into the lungs every 6 (six) hours as needed for wheezing or shortness of breath.  Marland Kitchen aspirin 81 MG tablet Take 81 mg by mouth daily.  . Blood Glucose Monitoring Suppl (TRUE METRIX AIR GLUCOSE METER) DEVI   . Calcium Carb-Cholecalciferol (CALCIUM + D3 PO) Take by mouth.  . Cholecalciferol (VITAMIN D) 2000 units CAPS Take 1 capsule by mouth daily.  . citalopram (CELEXA) 10 MG tablet Take 0.5 tablets (5 mg total) by mouth at bedtime. Taking 5 mg. Only.  . fish oil-omega-3 fatty acids 1000 MG capsule Take 1 g by mouth daily.    Marland Kitchen LORazepam (ATIVAN) 0.5 MG tablet Take 1 tablet (0.5 mg total) by mouth 2 (two) times daily as needed. (Patient taking differently: Take 0.5 mg by mouth daily. )  . losartan (COZAAR) 50 MG tablet TAKE 1 TABLET EVERY DAY  . metFORMIN (GLUCOPHAGE) 500 MG tablet TAKE 1 TABLET TWICE DAILY WITH MEALS  . OLANZapine (ZYPREXA) 5 MG tablet Take 5 mg by mouth daily.  . pantoprazole (PROTONIX) 40 MG tablet TAKE 1 TABLET EVERY DAY  . SYMBICORT 160-4.5 MCG/ACT inhaler INHALE  2 PUFFS  TWICE A DAY  . tamoxifen (NOLVADEX) 20 MG tablet TAKE 1 TABLET EVERY DAY  . triamcinolone ointment (KENALOG) 0.1 % Apply 1 application topically 2 (two) times daily.  . TRUE METRIX BLOOD GLUCOSE TEST test strip   . [DISCONTINUED] Cholecalciferol (VITAMIN D) 1000 UNITS capsule Take 3,000 Units by mouth daily.    No facility-administered encounter medications on file as of 11/17/2015.    Activities of Daily Living In your present state of health, do you have any difficulty performing the following  activities: 11/17/2015 11/17/2015  Hearing? N N  Vision? N N  Difficulty concentrating or making decisions? N N  Walking or climbing stairs? N N  Dressing or bathing? N N  Doing errands, shopping? N N    Patient Care Team: Rosalita Chessman, DO as PCP - General (Family Medicine) Wynona Neat. Shana Chute, MD as Referring Physician (Gastroenterology) Franchot Gallo, MD as Consulting Physician (Urology) Ricard Dillon, MD (Psychiatry) Trula Slade, DPM as Consulting Physician (Podiatry)    Assessment:    cpe: Exercise Activities and Dietary recommendations-- walks with neighbor    Goals    None     Fall Risk Fall Risk  11/17/2015 07/20/2015 01/30/2015 01/20/2015 07/22/2014  Falls in the past year? No No No No No  Risk for fall due to : - - - - -   Depression Screen PHQ 2/9 Scores 11/17/2015 01/30/2015 12/08/2013 10/22/2013  PHQ - 2 Score 0 0 0 0     Cognitive Testing mmse 30/30  Immunization History  Administered Date(s) Administered  . Influenza Whole  07/01/2012  . Influenza,inj,Quad PF,36+ Mos 08/12/2013, 07/22/2014  . Influenza-Unspecified 09/20/2015  . Pneumococcal Conjugate-13 01/30/2015  . Pneumococcal Polysaccharide-23 10/23/2003, 10/22/2013  . Td 06/22/2013  . Tdap 09/20/2015  . Zoster 09/07/2013   Screening Tests Health Maintenance  Topic Date Due  . HEMOGLOBIN A1C  05/16/2016  . INFLUENZA VACCINE  05/21/2016  . FOOT EXAM  05/31/2016  . OPHTHALMOLOGY EXAM  07/13/2016  . MAMMOGRAM  06/12/2017  . COLONOSCOPY  01/16/2023  . TETANUS/TDAP  09/19/2025  . DEXA SCAN  Completed  . ZOSTAVAX  Completed  . Hepatitis C Screening  Completed  . PNA vac Low Risk Adult  Completed      Plan:    See AVS During the course of the visit the patient was educated and counseled about the following appropriate screening and preventive services:   Vaccines to include Pneumoccal, Influenza, Hepatitis B, Td, Zostavax, HCV  Electrocardiogram  Cardiovascular  Disease  Colorectal cancer screening  Bone density screening  Diabetes screening  Glaucoma screening  Mammography/PAP  Nutrition counseling   Patient Instructions (the written plan) was given to the patient.  1. Preventative health care See above  2. Need for hepatitis C screening test  - Hepatitis C antibody  3. DM (diabetes mellitus) type II uncontrolled, periph vascular disorder (HCC) Check labs, con't meds  Current outpatient prescriptions:  .  albuterol (PROAIR HFA) 108 (90 BASE) MCG/ACT inhaler, Inhale 2 puffs into the lungs every 6 (six) hours as needed for wheezing or shortness of breath., Disp: 1 Inhaler, Rfl: 3 .  aspirin 81 MG tablet, Take 81 mg by mouth daily., Disp: , Rfl:  .  Blood Glucose Monitoring Suppl (TRUE METRIX AIR GLUCOSE METER) DEVI, , Disp: , Rfl:  .  Calcium Carb-Cholecalciferol (CALCIUM + D3 PO), Take by mouth., Disp: , Rfl:  .  Cholecalciferol (VITAMIN D) 2000 units CAPS, Take 1 capsule by mouth daily., Disp: , Rfl:  .  citalopram (CELEXA) 10 MG tablet, Take 0.5 tablets (5 mg total) by mouth at bedtime. Taking 5 mg. Only., Disp: 90 tablet, Rfl: 1 .  fish oil-omega-3 fatty acids 1000 MG capsule, Take 1 g by mouth daily.  , Disp: , Rfl:  .  LORazepam (ATIVAN) 0.5 MG tablet, Take 1 tablet (0.5 mg total) by mouth 2 (two) times daily as needed. (Patient taking differently: Take 0.5 mg by mouth daily. ), Disp: 90 tablet, Rfl: 0 .  losartan (COZAAR) 50 MG tablet, TAKE 1 TABLET EVERY DAY, Disp: 90 tablet, Rfl: 1 .  metFORMIN (GLUCOPHAGE) 500 MG tablet, TAKE 1 TABLET TWICE DAILY WITH MEALS, Disp: 180 tablet, Rfl: 1 .  OLANZapine (ZYPREXA) 5 MG tablet, Take 5 mg by mouth daily., Disp: , Rfl:  .  pantoprazole (PROTONIX) 40 MG tablet, TAKE 1 TABLET EVERY DAY, Disp: 90 tablet, Rfl: 1 .  SYMBICORT 160-4.5 MCG/ACT inhaler, INHALE  2 PUFFS  TWICE A DAY, Disp: 3 Inhaler, Rfl: 1 .  tamoxifen (NOLVADEX) 20 MG tablet, TAKE 1 TABLET EVERY DAY, Disp: 90 tablet, Rfl: 1 .   triamcinolone ointment (KENALOG) 0.1 %, Apply 1 application topically 2 (two) times daily., Disp: 90 g, Rfl: 1 .  TRUE METRIX BLOOD GLUCOSE TEST test strip, , Disp: , Rfl:   - POCT urinalysis dipstick - CBC with Differential/Platelet - Comp Met (CMET) - Hemoglobin A1c  4. Hyperlipidemia LDL goal <70 Check labs - Lipid panel  5. Osteopenia   - DG Bone Density; Future  Garnet Koyanagi, DO  11/18/2015

## 2015-11-17 NOTE — Patient Instructions (Signed)
Preventive Care for Adults, Female A healthy lifestyle and preventive care can promote health and wellness. Preventive health guidelines for women include the following key practices.  A routine yearly physical is a good way to check with your health care provider about your health and preventive screening. It is a chance to share any concerns and updates on your health and to receive a thorough exam.  Visit your dentist for a routine exam and preventive care every 6 months. Brush your teeth twice a day and floss once a day. Good oral hygiene prevents tooth decay and gum disease.  The frequency of eye exams is based on your age, health, family medical history, use of contact lenses, and other factors. Follow your health care provider's recommendations for frequency of eye exams.  Eat a healthy diet. Foods like vegetables, fruits, whole grains, low-fat dairy products, and lean protein foods contain the nutrients you need without too many calories. Decrease your intake of foods high in solid fats, added sugars, and salt. Eat the right amount of calories for you.Get information about a proper diet from your health care provider, if necessary.  Regular physical exercise is one of the most important things you can do for your health. Most adults should get at least 150 minutes of moderate-intensity exercise (any activity that increases your heart rate and causes you to sweat) each week. In addition, most adults need muscle-strengthening exercises on 2 or more days a week.  Maintain a healthy weight. The body mass index (BMI) is a screening tool to identify possible weight problems. It provides an estimate of body fat based on height and weight. Your health care provider can find your BMI and can help you achieve or maintain a healthy weight.For adults 20 years and older:  A BMI below 18.5 is considered underweight.  A BMI of 18.5 to 24.9 is normal.  A BMI of 25 to 29.9 is considered overweight.  A  BMI of 30 and above is considered obese.  Maintain normal blood lipids and cholesterol levels by exercising and minimizing your intake of saturated fat. Eat a balanced diet with plenty of fruit and vegetables. Blood tests for lipids and cholesterol should begin at age 45 and be repeated every 5 years. If your lipid or cholesterol levels are high, you are over 50, or you are at high risk for heart disease, you may need your cholesterol levels checked more frequently.Ongoing high lipid and cholesterol levels should be treated with medicines if diet and exercise are not working.  If you smoke, find out from your health care provider how to quit. If you do not use tobacco, do not start.  Lung cancer screening is recommended for adults aged 45-80 years who are at high risk for developing lung cancer because of a history of smoking. A yearly low-dose CT scan of the lungs is recommended for people who have at least a 30-pack-year history of smoking and are a current smoker or have quit within the past 15 years. A pack year of smoking is smoking an average of 1 pack of cigarettes a day for 1 year (for example: 1 pack a day for 30 years or 2 packs a day for 15 years). Yearly screening should continue until the smoker has stopped smoking for at least 15 years. Yearly screening should be stopped for people who develop a health problem that would prevent them from having lung cancer treatment.  If you are pregnant, do not drink alcohol. If you are  breastfeeding, be very cautious about drinking alcohol. If you are not pregnant and choose to drink alcohol, do not have more than 1 drink per day. One drink is considered to be 12 ounces (355 mL) of beer, 5 ounces (148 mL) of wine, or 1.5 ounces (44 mL) of liquor.  Avoid use of street drugs. Do not share needles with anyone. Ask for help if you need support or instructions about stopping the use of drugs.  High blood pressure causes heart disease and increases the risk  of stroke. Your blood pressure should be checked at least every 1 to 2 years. Ongoing high blood pressure should be treated with medicines if weight loss and exercise do not work.  If you are 55-79 years old, ask your health care provider if you should take aspirin to prevent strokes.  Diabetes screening is done by taking a blood sample to check your blood glucose level after you have not eaten for a certain period of time (fasting). If you are not overweight and you do not have risk factors for diabetes, you should be screened once every 3 years starting at age 45. If you are overweight or obese and you are 40-70 years of age, you should be screened for diabetes every year as part of your cardiovascular risk assessment.  Breast cancer screening is essential preventive care for women. You should practice "breast self-awareness." This means understanding the normal appearance and feel of your breasts and may include breast self-examination. Any changes detected, no matter how small, should be reported to a health care provider. Women in their 20s and 30s should have a clinical breast exam (CBE) by a health care provider as part of a regular health exam every 1 to 3 years. After age 40, women should have a CBE every year. Starting at age 40, women should consider having a mammogram (breast X-ray test) every year. Women who have a family history of breast cancer should talk to their health care provider about genetic screening. Women at a high risk of breast cancer should talk to their health care providers about having an MRI and a mammogram every year.  Breast cancer gene (BRCA)-related cancer risk assessment is recommended for women who have family members with BRCA-related cancers. BRCA-related cancers include breast, ovarian, tubal, and peritoneal cancers. Having family members with these cancers may be associated with an increased risk for harmful changes (mutations) in the breast cancer genes BRCA1 and  BRCA2. Results of the assessment will determine the need for genetic counseling and BRCA1 and BRCA2 testing.  Your health care provider may recommend that you be screened regularly for cancer of the pelvic organs (ovaries, uterus, and vagina). This screening involves a pelvic examination, including checking for microscopic changes to the surface of your cervix (Pap test). You may be encouraged to have this screening done every 3 years, beginning at age 21.  For women ages 30-65, health care providers may recommend pelvic exams and Pap testing every 3 years, or they may recommend the Pap and pelvic exam, combined with testing for human papilloma virus (HPV), every 5 years. Some types of HPV increase your risk of cervical cancer. Testing for HPV may also be done on women of any age with unclear Pap test results.  Other health care providers may not recommend any screening for nonpregnant women who are considered low risk for pelvic cancer and who do not have symptoms. Ask your health care provider if a screening pelvic exam is right for   you.  If you have had past treatment for cervical cancer or a condition that could lead to cancer, you need Pap tests and screening for cancer for at least 20 years after your treatment. If Pap tests have been discontinued, your risk factors (such as having a new sexual partner) need to be reassessed to determine if screening should resume. Some women have medical problems that increase the chance of getting cervical cancer. In these cases, your health care provider may recommend more frequent screening and Pap tests.  Colorectal cancer can be detected and often prevented. Most routine colorectal cancer screening begins at the age of 50 years and continues through age 75 years. However, your health care provider may recommend screening at an earlier age if you have risk factors for colon cancer. On a yearly basis, your health care provider may provide home test kits to check  for hidden blood in the stool. Use of a small camera at the end of a tube, to directly examine the colon (sigmoidoscopy or colonoscopy), can detect the earliest forms of colorectal cancer. Talk to your health care provider about this at age 50, when routine screening begins. Direct exam of the colon should be repeated every 5-10 years through age 75 years, unless early forms of precancerous polyps or small growths are found.  People who are at an increased risk for hepatitis B should be screened for this virus. You are considered at high risk for hepatitis B if:  You were born in a country where hepatitis B occurs often. Talk with your health care provider about which countries are considered high risk.  Your parents were born in a high-risk country and you have not received a shot to protect against hepatitis B (hepatitis B vaccine).  You have HIV or AIDS.  You use needles to inject street drugs.  You live with, or have sex with, someone who has hepatitis B.  You get hemodialysis treatment.  You take certain medicines for conditions like cancer, organ transplantation, and autoimmune conditions.  Hepatitis C blood testing is recommended for all people born from 1945 through 1965 and any individual with known risks for hepatitis C.  Practice safe sex. Use condoms and avoid high-risk sexual practices to reduce the spread of sexually transmitted infections (STIs). STIs include gonorrhea, chlamydia, syphilis, trichomonas, herpes, HPV, and human immunodeficiency virus (HIV). Herpes, HIV, and HPV are viral illnesses that have no cure. They can result in disability, cancer, and death.  You should be screened for sexually transmitted illnesses (STIs) including gonorrhea and chlamydia if:  You are sexually active and are younger than 24 years.  You are older than 24 years and your health care provider tells you that you are at risk for this type of infection.  Your sexual activity has changed  since you were last screened and you are at an increased risk for chlamydia or gonorrhea. Ask your health care provider if you are at risk.  If you are at risk of being infected with HIV, it is recommended that you take a prescription medicine daily to prevent HIV infection. This is called preexposure prophylaxis (PrEP). You are considered at risk if:  You are sexually active and do not regularly use condoms or know the HIV status of your partner(s).  You take drugs by injection.  You are sexually active with a partner who has HIV.  Talk with your health care provider about whether you are at high risk of being infected with HIV. If   you choose to begin PrEP, you should first be tested for HIV. You should then be tested every 3 months for as long as you are taking PrEP.  Osteoporosis is a disease in which the bones lose minerals and strength with aging. This can result in serious bone fractures or breaks. The risk of osteoporosis can be identified using a bone density scan. Women ages 67 years and over and women at risk for fractures or osteoporosis should discuss screening with their health care providers. Ask your health care provider whether you should take a calcium supplement or vitamin D to reduce the rate of osteoporosis.  Menopause can be associated with physical symptoms and risks. Hormone replacement therapy is available to decrease symptoms and risks. You should talk to your health care provider about whether hormone replacement therapy is right for you.  Use sunscreen. Apply sunscreen liberally and repeatedly throughout the day. You should seek shade when your shadow is shorter than you. Protect yourself by wearing long sleeves, pants, a wide-brimmed hat, and sunglasses year round, whenever you are outdoors.  Once a month, do a whole body skin exam, using a mirror to look at the skin on your back. Tell your health care provider of new moles, moles that have irregular borders, moles that  are larger than a pencil eraser, or moles that have changed in shape or color.  Stay current with required vaccines (immunizations).  Influenza vaccine. All adults should be immunized every year.  Tetanus, diphtheria, and acellular pertussis (Td, Tdap) vaccine. Pregnant women should receive 1 dose of Tdap vaccine during each pregnancy. The dose should be obtained regardless of the length of time since the last dose. Immunization is preferred during the 27th-36th week of gestation. An adult who has not previously received Tdap or who does not know her vaccine status should receive 1 dose of Tdap. This initial dose should be followed by tetanus and diphtheria toxoids (Td) booster doses every 10 years. Adults with an unknown or incomplete history of completing a 3-dose immunization series with Td-containing vaccines should begin or complete a primary immunization series including a Tdap dose. Adults should receive a Td booster every 10 years.  Varicella vaccine. An adult without evidence of immunity to varicella should receive 2 doses or a second dose if she has previously received 1 dose. Pregnant females who do not have evidence of immunity should receive the first dose after pregnancy. This first dose should be obtained before leaving the health care facility. The second dose should be obtained 4-8 weeks after the first dose.  Human papillomavirus (HPV) vaccine. Females aged 13-26 years who have not received the vaccine previously should obtain the 3-dose series. The vaccine is not recommended for use in pregnant females. However, pregnancy testing is not needed before receiving a dose. If a female is found to be pregnant after receiving a dose, no treatment is needed. In that case, the remaining doses should be delayed until after the pregnancy. Immunization is recommended for any person with an immunocompromised condition through the age of 61 years if she did not get any or all doses earlier. During the  3-dose series, the second dose should be obtained 4-8 weeks after the first dose. The third dose should be obtained 24 weeks after the first dose and 16 weeks after the second dose.  Zoster vaccine. One dose is recommended for adults aged 30 years or older unless certain conditions are present.  Measles, mumps, and rubella (MMR) vaccine. Adults born  before 1957 generally are considered immune to measles and mumps. Adults born in 1957 or later should have 1 or more doses of MMR vaccine unless there is a contraindication to the vaccine or there is laboratory evidence of immunity to each of the three diseases. A routine second dose of MMR vaccine should be obtained at least 28 days after the first dose for students attending postsecondary schools, health care workers, or international travelers. People who received inactivated measles vaccine or an unknown type of measles vaccine during 1963-1967 should receive 2 doses of MMR vaccine. People who received inactivated mumps vaccine or an unknown type of mumps vaccine before 1979 and are at high risk for mumps infection should consider immunization with 2 doses of MMR vaccine. For females of childbearing age, rubella immunity should be determined. If there is no evidence of immunity, females who are not pregnant should be vaccinated. If there is no evidence of immunity, females who are pregnant should delay immunization until after pregnancy. Unvaccinated health care workers born before 1957 who lack laboratory evidence of measles, mumps, or rubella immunity or laboratory confirmation of disease should consider measles and mumps immunization with 2 doses of MMR vaccine or rubella immunization with 1 dose of MMR vaccine.  Pneumococcal 13-valent conjugate (PCV13) vaccine. When indicated, a person who is uncertain of his immunization history and has no record of immunization should receive the PCV13 vaccine. All adults 65 years of age and older should receive this  vaccine. An adult aged 19 years or older who has certain medical conditions and has not been previously immunized should receive 1 dose of PCV13 vaccine. This PCV13 should be followed with a dose of pneumococcal polysaccharide (PPSV23) vaccine. Adults who are at high risk for pneumococcal disease should obtain the PPSV23 vaccine at least 8 weeks after the dose of PCV13 vaccine. Adults older than 70 years of age who have normal immune system function should obtain the PPSV23 vaccine dose at least 1 year after the dose of PCV13 vaccine.  Pneumococcal polysaccharide (PPSV23) vaccine. When PCV13 is also indicated, PCV13 should be obtained first. All adults aged 65 years and older should be immunized. An adult younger than age 65 years who has certain medical conditions should be immunized. Any person who resides in a nursing home or long-term care facility should be immunized. An adult smoker should be immunized. People with an immunocompromised condition and certain other conditions should receive both PCV13 and PPSV23 vaccines. People with human immunodeficiency virus (HIV) infection should be immunized as soon as possible after diagnosis. Immunization during chemotherapy or radiation therapy should be avoided. Routine use of PPSV23 vaccine is not recommended for American Indians, [place] Natives, or people younger than 65 years unless there are medical conditions that require PPSV23 vaccine. When indicated, people who have unknown immunization and have no record of immunization should receive PPSV23 vaccine. One-time revaccination 5 years after the first dose of PPSV23 is recommended for people aged 19-64 years who have chronic kidney failure, nephrotic syndrome, asplenia, or immunocompromised conditions. People who received 1-2 doses of PPSV23 before age 65 years should receive another dose of PPSV23 vaccine at age 65 years or later if at least 5 years have passed since the previous dose. Doses of PPSV23 are not  needed for people immunized with PPSV23 at or after age 65 years.  Meningococcal vaccine. Adults with asplenia or persistent complement component deficiencies should receive 2 doses of quadrivalent meningococcal conjugate (MenACWY-D) vaccine. The doses should be obtained   at least 2 months apart. Microbiologists working with certain meningococcal bacteria, [place] recruits, people at risk during an outbreak, and people who travel to or live in countries with a high rate of meningitis should be immunized. A first-year college student up through age 34 years who is living in a residence hall should receive a dose if she did not receive a dose on or after her 16th birthday. Adults who have certain high-risk conditions should receive one or more doses of vaccine.  Hepatitis A vaccine. Adults who wish to be protected from this disease, have certain high-risk conditions, work with hepatitis A-infected animals, work in hepatitis A research labs, or travel to or work in countries with a high rate of hepatitis A should be immunized. Adults who were previously unvaccinated and who anticipate close contact with an international adoptee during the first 60 days after arrival in the [place] from a country with a high rate of hepatitis A should be immunized.  Hepatitis B vaccine. Adults who wish to be protected from this disease, have certain high-risk conditions, may be exposed to blood or other infectious body fluids, are household contacts or sex partners of hepatitis B positive people, are clients or workers in certain care facilities, or travel to or work in countries with a high rate of hepatitis B should be immunized.  Haemophilus influenzae type b (Hib) vaccine. A previously unvaccinated person with asplenia or sickle cell disease or having a scheduled splenectomy should receive 1 dose of Hib vaccine. Regardless of previous immunization, a recipient of a hematopoietic stem cell transplant should receive a  3-dose series 6-12 months after her successful transplant. Hib vaccine is not recommended for adults with HIV infection. Preventive Services / Frequency Ages 35 to 4 years  Blood pressure check.** / Every 3-5 years.  Lipid and cholesterol check.** / Every 5 years beginning at age 60.  Clinical breast exam.** / Every 3 years for women in their 71s and 10s.  BRCA-related cancer risk assessment.** / For women who have family members with a BRCA-related cancer (breast, ovarian, tubal, or peritoneal cancers).  Pap test.** / Every 2 years from ages 76 through 26. Every 3 years starting at age 61 through age 76 or 93 with a history of 3 consecutive normal Pap tests.  HPV screening.** / Every 3 years from ages 37 through ages 60 to 51 with a history of 3 consecutive normal Pap tests.  Hepatitis C blood test.** / For any individual with known risks for hepatitis C.  Skin self-exam. / Monthly.  Influenza vaccine. / Every year.  Tetanus, diphtheria, and acellular pertussis (Tdap, Td) vaccine.** / Consult your health care provider. Pregnant women should receive 1 dose of Tdap vaccine during each pregnancy. 1 dose of Td every 10 years.  Varicella vaccine.** / Consult your health care provider. Pregnant females who do not have evidence of immunity should receive the first dose after pregnancy.  HPV vaccine. / 3 doses over 6 months, if 93 and younger. The vaccine is not recommended for use in pregnant females. However, pregnancy testing is not needed before receiving a dose.  Measles, mumps, rubella (MMR) vaccine.** / You need at least 1 dose of MMR if you were born in 1957 or later. You may also need a 2nd dose. For females of childbearing age, rubella immunity should be determined. If there is no evidence of immunity, females who are not pregnant should be vaccinated. If there is no evidence of immunity, females who are  pregnant should delay immunization until after pregnancy.  Pneumococcal  13-valent conjugate (PCV13) vaccine.** / Consult your health care provider.  Pneumococcal polysaccharide (PPSV23) vaccine.** / 1 to 2 doses if you smoke cigarettes or if you have certain conditions.  Meningococcal vaccine.** / 1 dose if you are age 68 to 8 years and a Market researcher living in a residence hall, or have one of several medical conditions, you need to get vaccinated against meningococcal disease. You may also need additional booster doses.  Hepatitis A vaccine.** / Consult your health care provider.  Hepatitis B vaccine.** / Consult your health care provider.  Haemophilus influenzae type b (Hib) vaccine.** / Consult your health care provider. Ages 7 to 53 years  Blood pressure check.** / Every year.  Lipid and cholesterol check.** / Every 5 years beginning at age 25 years.  Lung cancer screening. / Every year if you are aged 11-80 years and have a 30-pack-year history of smoking and currently smoke or have quit within the past 15 years. Yearly screening is stopped once you have quit smoking for at least 15 years or develop a health problem that would prevent you from having lung cancer treatment.  Clinical breast exam.** / Every year after age 48 years.  BRCA-related cancer risk assessment.** / For women who have family members with a BRCA-related cancer (breast, ovarian, tubal, or peritoneal cancers).  Mammogram.** / Every year beginning at age 41 years and continuing for as long as you are in good health. Consult with your health care provider.  Pap test.** / Every 3 years starting at age 65 years through age 37 or 70 years with a history of 3 consecutive normal Pap tests.  HPV screening.** / Every 3 years from ages 72 years through ages 60 to 40 years with a history of 3 consecutive normal Pap tests.  Fecal occult blood test (FOBT) of stool. / Every year beginning at age 21 years and continuing until age 5 years. You may not need to do this test if you get  a colonoscopy every 10 years.  Flexible sigmoidoscopy or colonoscopy.** / Every 5 years for a flexible sigmoidoscopy or every 10 years for a colonoscopy beginning at age 35 years and continuing until age 48 years.  Hepatitis C blood test.** / For all people born from [birth year] through 1965 and any individual with known risks for hepatitis C.  Skin self-exam. / Monthly.  Influenza vaccine. / Every year.  Tetanus, diphtheria, and acellular pertussis (Tdap/Td) vaccine.** / Consult your health care provider. Pregnant women should receive 1 dose of Tdap vaccine during each pregnancy. 1 dose of Td every 10 years.  Varicella vaccine.** / Consult your health care provider. Pregnant females who do not have evidence of immunity should receive the first dose after pregnancy.  Zoster vaccine.** / 1 dose for adults aged 30 years or older.  Measles, mumps, rubella (MMR) vaccine.** / You need at least 1 dose of MMR if you were born in 1957 or later. You may also need a second dose. For females of childbearing age, rubella immunity should be determined. If there is no evidence of immunity, females who are not pregnant should be vaccinated. If there is no evidence of immunity, females who are pregnant should delay immunization until after pregnancy.  Pneumococcal 13-valent conjugate (PCV13) vaccine.** / Consult your health care provider.  Pneumococcal polysaccharide (PPSV23) vaccine.** / 1 to 2 doses if you smoke cigarettes or if you have certain conditions.  Meningococcal vaccine.** /  Consult your health care provider.  Hepatitis A vaccine.** / Consult your health care provider.  Hepatitis B vaccine.** / Consult your health care provider.  Haemophilus influenzae type b (Hib) vaccine.** / Consult your health care provider. Ages 64 years and over  Blood pressure check.** / Every year.  Lipid and cholesterol check.** / Every 5 years beginning at age 23 years.  Lung cancer screening. / Every year if you  are aged 16-80 years and have a 30-pack-year history of smoking and currently smoke or have quit within the past 15 years. Yearly screening is stopped once you have quit smoking for at least 15 years or develop a health problem that would prevent you from having lung cancer treatment.  Clinical breast exam.** / Every year after age 74 years.  BRCA-related cancer risk assessment.** / For women who have family members with a BRCA-related cancer (breast, ovarian, tubal, or peritoneal cancers).  Mammogram.** / Every year beginning at age 44 years and continuing for as long as you are in good health. Consult with your health care provider.  Pap test.** / Every 3 years starting at age 58 years through age 22 or 39 years with 3 consecutive normal Pap tests. Testing can be stopped between 65 and 70 years with 3 consecutive normal Pap tests and no abnormal Pap or HPV tests in the past 10 years.  HPV screening.** / Every 3 years from ages 64 years through ages 70 or 61 years with a history of 3 consecutive normal Pap tests. Testing can be stopped between 65 and 70 years with 3 consecutive normal Pap tests and no abnormal Pap or HPV tests in the past 10 years.  Fecal occult blood test (FOBT) of stool. / Every year beginning at age 40 years and continuing until age 27 years. You may not need to do this test if you get a colonoscopy every 10 years.  Flexible sigmoidoscopy or colonoscopy.** / Every 5 years for a flexible sigmoidoscopy or every 10 years for a colonoscopy beginning at age 7 years and continuing until age 32 years.  Hepatitis C blood test.** / For all people born from [birth year] through 1965 and any individual with known risks for hepatitis C.  Osteoporosis screening.** / A one-time screening for women ages 30 years and over and women at risk for fractures or osteoporosis.  Skin self-exam. / Monthly.  Influenza vaccine. / Every year.  Tetanus, diphtheria, and acellular pertussis (Tdap/Td)  vaccine.** / 1 dose of Td every 10 years.  Varicella vaccine.** / Consult your health care provider.  Zoster vaccine.** / 1 dose for adults aged 35 years or older.  Pneumococcal 13-valent conjugate (PCV13) vaccine.** / Consult your health care provider.  Pneumococcal polysaccharide (PPSV23) vaccine.** / 1 dose for all adults aged 46 years and older.  Meningococcal vaccine.** / Consult your health care provider.  Hepatitis A vaccine.** / Consult your health care provider.  Hepatitis B vaccine.** / Consult your health care provider.  Haemophilus influenzae type b (Hib) vaccine.** / Consult your health care provider. ** Family history and personal history of risk and conditions may change your health care provider's recommendations.   This information is not intended to replace advice given to you by your health care provider. Make sure you discuss any questions you have with your health care provider.   Document Released: 12/03/2001 Document Revised: 10/28/2014 Document Reviewed: 03/04/2011 Elsevier Interactive Patient Education [employer].

## 2015-11-18 ENCOUNTER — Encounter: Payer: Self-pay | Admitting: Family Medicine

## 2015-11-18 LAB — HEPATITIS C ANTIBODY: HCV Ab: NEGATIVE

## 2015-11-27 ENCOUNTER — Ambulatory Visit (HOSPITAL_BASED_OUTPATIENT_CLINIC_OR_DEPARTMENT_OTHER)
Admission: RE | Admit: 2015-11-27 | Discharge: 2015-11-27 | Disposition: A | Discharge status: Home / Self Care | Payer: Commercial Managed Care - HMO | Source: Ambulatory Visit | Attending: Family Medicine | Admitting: Family Medicine

## 2015-11-27 DIAGNOSIS — Z87891 Personal history of nicotine dependence: Secondary | ICD-10-CM | POA: Diagnosis not present

## 2015-11-27 DIAGNOSIS — M8580 Other specified disorders of bone density and structure, unspecified site: Secondary | ICD-10-CM | POA: Diagnosis not present

## 2015-11-27 DIAGNOSIS — E2839 Other primary ovarian failure: Secondary | ICD-10-CM | POA: Diagnosis not present

## 2015-11-27 DIAGNOSIS — Z780 Asymptomatic menopausal state: Secondary | ICD-10-CM | POA: Insufficient documentation

## 2015-11-27 DIAGNOSIS — M85852 Other specified disorders of bone density and structure, left thigh: Secondary | ICD-10-CM | POA: Diagnosis not present

## 2015-12-04 ENCOUNTER — Encounter: Payer: Self-pay | Admitting: Podiatry

## 2015-12-04 ENCOUNTER — Ambulatory Visit (INDEPENDENT_AMBULATORY_CARE_PROVIDER_SITE_OTHER): Payer: Commercial Managed Care - HMO | Admitting: Podiatry

## 2015-12-04 ENCOUNTER — Other Ambulatory Visit: Payer: Self-pay | Admitting: Family Medicine

## 2015-12-04 ENCOUNTER — Telehealth: Payer: Self-pay | Admitting: *Deleted

## 2015-12-04 DIAGNOSIS — L814 Other melanin hyperpigmentation: Secondary | ICD-10-CM | POA: Diagnosis not present

## 2015-12-04 DIAGNOSIS — Q828 Other specified congenital malformations of skin: Secondary | ICD-10-CM

## 2015-12-04 DIAGNOSIS — L608 Other nail disorders: Secondary | ICD-10-CM

## 2015-12-04 DIAGNOSIS — L609 Nail disorder, unspecified: Secondary | ICD-10-CM

## 2015-12-04 DIAGNOSIS — M79676 Pain in unspecified toe(s): Secondary | ICD-10-CM

## 2015-12-04 DIAGNOSIS — B351 Tinea unguium: Secondary | ICD-10-CM

## 2015-12-04 NOTE — Telephone Encounter (Signed)
Right 5th toenail sent in specimen bottle to [facility] for definitive r/o melanoma.

## 2015-12-04 NOTE — Progress Notes (Signed)
Patient ID: Michelle Long, female   DOB: 05-24-1946, 70 y.o.   MRN: [id number]  Subjective: 70 y.o. returns the office today for painful, elongated, thickened toenails and calluses that she cannot trim herself. Denies any redness or drainage around the nails/calluses. Denies any acute changes since last appointment and no new complaints today. Denies any systemic complaints such as fevers, chills, nausea, vomiting.   Objective: AAO 3, NAD DP/PT pulses palpable, CRT less than 3 seconds Nails hypertrophic, dystrophic, elongated, brittle, discolored 10. There is tenderness overlying the nails 1-5 bilaterally. There is no surrounding erythema or drainage along the nail sites. On the right fifth digit toenail that has been to be too hyperpigmented streaks within the fifth metatarsal. This appears to be somewhat worse compared to what it was previously. There is not appear to be any extension hyperpigmented tissue and the surrounding skin. There is no tenderness and no drainage or swelling. Hyperkertotic lesions bilateral submetarsal 1 and 5. Upon debridement, no underlying ulceration, drainage, or other signs of infection.  No open lesions or pre-ulcerative lesions are identified. No other areas of tenderness bilateral lower extremities. No overlying edema, erythema, increased warmth. No pain with calf compression, swelling, warmth, erythema.  Assessment: Patient presents with symptomatic onychomycosis; hyperkertoic lesions   Plan: -Treatment options including alternatives, risks, complications were discussed -Nails sharply debrided 10 without complication/bleeding. -Hyperkerotic lesions sharply debrided x 4 without complications/bleeding.  -A can discuss the biopsy the fifth digit toenail. She wishes to hold off and we will toenail however it did debride the nail and send the nail for biopsy. Also recommended follow up with dermatology. -Discussed daily foot inspection. If there are any changes, to  call the office immediately.  -Follow-up in 3 months or sooner if any problems are to arise. In the meantime, encouraged to call the office with any questions, concerns, changes symptoms.  Celesta Gentile, DPM

## 2015-12-14 DIAGNOSIS — F250 Schizoaffective disorder, bipolar type: Secondary | ICD-10-CM | POA: Diagnosis not present

## 2015-12-18 ENCOUNTER — Ambulatory Visit: Payer: Commercial Managed Care - HMO | Admitting: Podiatry

## 2015-12-26 ENCOUNTER — Other Ambulatory Visit: Payer: Self-pay | Admitting: Family Medicine

## 2016-01-01 ENCOUNTER — Ambulatory Visit (HOSPITAL_BASED_OUTPATIENT_CLINIC_OR_DEPARTMENT_OTHER): Payer: Commercial Managed Care - HMO | Admitting: Hematology & Oncology

## 2016-01-01 ENCOUNTER — Encounter: Payer: Self-pay | Admitting: Hematology & Oncology

## 2016-01-01 ENCOUNTER — Other Ambulatory Visit (HOSPITAL_BASED_OUTPATIENT_CLINIC_OR_DEPARTMENT_OTHER): Payer: Commercial Managed Care - HMO

## 2016-01-01 VITALS — BP 121/69 | HR 95 | Temp 97.7°F | Resp 18 | Ht 64.0 in | Wt 157.0 lb

## 2016-01-01 DIAGNOSIS — C50319 Malignant neoplasm of lower-inner quadrant of unspecified female breast: Secondary | ICD-10-CM | POA: Diagnosis not present

## 2016-01-01 DIAGNOSIS — Z853 Personal history of malignant neoplasm of breast: Secondary | ICD-10-CM

## 2016-01-01 DIAGNOSIS — Z79810 Long term (current) use of selective estrogen receptor modulators (SERMs): Secondary | ICD-10-CM

## 2016-01-01 DIAGNOSIS — C50312 Malignant neoplasm of lower-inner quadrant of left female breast: Secondary | ICD-10-CM

## 2016-01-01 DIAGNOSIS — Z170 Estrogen receptor positive status [ER+]: Secondary | ICD-10-CM

## 2016-01-01 DIAGNOSIS — E559 Vitamin D deficiency, unspecified: Secondary | ICD-10-CM

## 2016-01-01 LAB — CBC WITH DIFFERENTIAL (CANCER CENTER ONLY)
BASO#: 0.1 10*3/uL (ref 0.0–0.2)
BASO%: 0.7 % (ref 0.0–2.0)
EOS%: 2.2 % (ref 0.0–7.0)
Eosinophils Absolute: 0.2 10*3/uL (ref 0.0–0.5)
HCT: 41.7 % (ref 34.8–46.6)
HGB: 14.1 g/dL (ref 11.6–15.9)
LYMPH#: 1.7 10*3/uL (ref 0.9–3.3)
LYMPH%: 26 % (ref 14.0–48.0)
MCH: 31.8 pg (ref 26.0–34.0)
MCHC: 33.8 g/dL (ref 32.0–36.0)
MCV: 94 fL (ref 81–101)
MONO#: 0.6 10*3/uL (ref 0.1–0.9)
MONO%: 8.8 % (ref 0.0–13.0)
NEUT#: 4.2 10*3/uL (ref 1.5–6.5)
NEUT%: 62.3 % (ref 39.6–80.0)
Platelets: 146 10*3/uL (ref 145–400)
RBC: 4.43 10*6/uL (ref 3.70–5.32)
RDW: 12.6 % (ref 11.1–15.7)
WBC: 6.7 10*3/uL (ref 3.9–10.0)

## 2016-01-01 LAB — COMPREHENSIVE METABOLIC PANEL (CC13)
ALT: 46 IU/L — ABNORMAL HIGH (ref 0–32)
AST (SGOT): 48 IU/L — ABNORMAL HIGH (ref 0–40)
Albumin, Serum: 4.3 g/dL (ref 3.6–4.8)
Albumin/Globulin Ratio: 1.7 (ref 1.2–2.2)
Alkaline Phosphatase, S: 59 IU/L (ref 39–117)
BUN/Creatinine Ratio: 13 (ref 11–26)
BUN: 8 mg/dL (ref 8–27)
Bilirubin Total: 0.2 mg/dL (ref 0.0–1.2)
Calcium, Ser: 9.1 mg/dL (ref 8.7–10.3)
Carbon Dioxide, Total: 25 mmol/L (ref 18–29)
Chloride, Ser: 101 mmol/L (ref 96–106)
Creatinine, Ser: 0.62 mg/dL (ref 0.57–1.00)
GFR calc Af Amer: 106 mL/min/{1.73_m2} (ref >59)
GFR calc non Af Amer: 92 mL/min/{1.73_m2} (ref >59)
Globulin, Total: 2.5 g/dL (ref 1.5–4.5)
Glucose: 163 mg/dL — ABNORMAL HIGH (ref 65–99)
Potassium, Ser: 4.1 mmol/L (ref 3.5–5.2)
Sodium: 135 mmol/L (ref 134–144)
Total Protein: 6.8 g/dL (ref 6.0–8.5)

## 2016-01-01 NOTE — Progress Notes (Signed)
Hematology and Oncology Follow Up Visit  Michelle Long [id number] Mar 31, 1946 70 y.o. 01/01/2016   Principle Diagnosis:   Stage IIA (T2 N0M0) infiltrating ductal carcinoma of the left breast  Current Therapy:    Tamoxifen 20 mg by mouth daily - to finish in 2017.     Interim History:  Ms.  Long is in for followup. We see her every 6 months. She has been doing well. She now has a new granddaughter. She was born in early February. This is keeping Michelle Long  busy.  She has a no problems with infections. She's had no fever. She's had no change in bowel or bladder habits. She's had no nausea or vomiting. She's had no cough. She's had no fatigue or weakness. She walks with her 36 year old neighbor weekly.    she is due for mammogram this summer.  She's doing well on the tamoxifen. We probably will stop this when we see her back in 6 months.    overall, her performance status is ECOG 0.   Medications:  Current outpatient prescriptions:  .  albuterol (PROAIR HFA) 108 (90 BASE) MCG/ACT inhaler, Inhale 2 puffs into the lungs every 6 (six) hours as needed for wheezing or shortness of breath., Disp: 1 Inhaler, Rfl: 3 .  aspirin 81 MG tablet, Take 81 mg by mouth daily., Disp: , Rfl:  .  Blood Glucose Monitoring Suppl (TRUE METRIX AIR GLUCOSE METER) DEVI, , Disp: , Rfl:  .  Calcium Carb-Cholecalciferol (CALCIUM + D3 PO), Take by mouth., Disp: , Rfl:  .  Cholecalciferol (VITAMIN D) 2000 units CAPS, Take 1 capsule by mouth daily., Disp: , Rfl:  .  citalopram (CELEXA) 10 MG tablet, Take 0.5 tablets (5 mg total) by mouth at bedtime. Taking 5 mg. Only., Disp: 90 tablet, Rfl: 1 .  fish oil-omega-3 fatty acids 1000 MG capsule, Take 1 g by mouth daily.  , Disp: , Rfl:  .  glucose blood (TRUE METRIX BLOOD GLUCOSE TEST) test strip, Check your blood sugar once daily. Dx code:E11.9, Disp: 100 each, Rfl: 3 .  LORazepam (ATIVAN) 0.5 MG tablet, Take 1 tablet (0.5 mg total) by mouth 2 (two) times daily as needed.  (Patient taking differently: Take 0.5 mg by mouth daily. ), Disp: 90 tablet, Rfl: 0 .  losartan (COZAAR) 50 MG tablet, TAKE 1 TABLET EVERY DAY, Disp: 90 tablet, Rfl: 1 .  metFORMIN (GLUCOPHAGE) 500 MG tablet, TAKE 1 TABLET TWICE DAILY WITH MEALS, Disp: 180 tablet, Rfl: 1 .  OLANZapine (ZYPREXA) 5 MG tablet, Take 5 mg by mouth daily., Disp: , Rfl:  .  pantoprazole (PROTONIX) 40 MG tablet, TAKE 1 TABLET EVERY DAY, Disp: 90 tablet, Rfl: 1 .  SYMBICORT 160-4.5 MCG/ACT inhaler, INHALE  2 PUFFS  TWICE A DAY, Disp: 3 Inhaler, Rfl: 1 .  tamoxifen (NOLVADEX) 20 MG tablet, TAKE 1 TABLET EVERY DAY, Disp: 90 tablet, Rfl: 1  Allergies:  Allergies  Allergen Reactions  . Lamictal [Lamotrigine]     itch  . Prozac [Fluoxetine Hcl]     seizure  . Sulfa Drugs Cross Reactors     Dyspnea     Past Medical History, Surgical history, Social history, and Family History were reviewed and updated.  Review of Systems: As above  Physical Exam:  height is 5\' 4"  (1.626 m) and weight is 157 lb (71.215 kg). Her oral temperature is 97.7 F (36.5 C). Her blood pressure is 121/69 and her pulse is 95. Her respiration is 18.  Well-developed well-nourished white female. She is no ocular or oral lesions. There is no palpable cervical or supraclavicular lymph nodes. Lungs are clear folic. There are no rales wheezes or rhonchi. Cardiac exam regular rate and rhythm with no murmurs rubs or bruits. Breast exam shows right breast no masses edema or erythema. There is no right axillary adenopathy. Left breast shows a well-healed lumpectomy at about the 6:00 position. There is some contraction of the left breast. There's no mass. There is no left axillary adenopathy. Abdomen is soft. She has good bowel sounds. There is no fluid wave. There is no palpable hepato- splenomegaly back exam no tenderness over the spine ribs or hips. She has no kyphosis. Extremities shows some osteoarthritic changes. Has good strength. Skin exam no rashes.  Neurological exam no focal neurological deficits.  Lab Results  Component Value Date   WBC 6.7 01/01/2016   HGB 14.1 01/01/2016   HCT 41.7 01/01/2016   MCV 94 01/01/2016   PLT 146 01/01/2016     Chemistry      Component Value Date/Time   NA 142 11/17/2015 1146   NA 143 07/20/2015 1406   NA 143 01/20/2015 1240   K 4.1 11/17/2015 1146   K 4.2 07/20/2015 1406   K 4.5 01/20/2015 1240   CL 104 11/17/2015 1146   CL 105 01/20/2015 1240   CO2 30 11/17/2015 1146   CO2 28 07/20/2015 1406   CO2 26 01/20/2015 1240   BUN 12 11/17/2015 1146   BUN 10.0 07/20/2015 1406   BUN 9 01/20/2015 1240   CREATININE 0.79 11/17/2015 1146   CREATININE 0.8 07/20/2015 1406   CREATININE 0.8 01/20/2015 1240      Component Value Date/Time   CALCIUM 9.5 11/17/2015 1146   CALCIUM 9.2 07/20/2015 1406   CALCIUM 9.1 01/20/2015 1240   ALKPHOS 44 11/17/2015 1146   ALKPHOS 46 07/20/2015 1406   ALKPHOS 40 01/20/2015 1240   AST 50* 11/17/2015 1146   AST 70* 07/20/2015 1406   AST 46* 01/20/2015 1240   ALT 48* 11/17/2015 1146   ALT 63* 07/20/2015 1406   ALT 43 01/20/2015 1240   BILITOT 0.3 11/17/2015 1146   BILITOT 0.42 07/20/2015 1406   BILITOT 0.60 01/20/2015 1240         Impression and Plan: Michelle Long is a 70 year old postmenopausal female. She has stage IIA ductal carcinoma left breast. Her breast cancer is triple positive. She underwent lumpectomy followed by chemotherapy and radiation back in 2007.  She's on tamoxifen now. I plan for a total of 10 years of tamoxifen.  I do not see any evidence of recurrence.   I will plan to get her back in 6 months.   Volanda Napoleon, MD 3/13/20174:06 PM

## 2016-01-02 ENCOUNTER — Telehealth: Payer: Self-pay | Admitting: *Deleted

## 2016-01-02 LAB — VITAMIN D 25 HYDROXY (VIT D DEFICIENCY, FRACTURES): Vitamin D, 25-Hydroxy: 43.8 ng/mL (ref 30.0–100.0)

## 2016-01-02 NOTE — Telephone Encounter (Addendum)
Patient aware of results.   ----- Message from Volanda Napoleon, MD sent at 01/02/2016  6:44 AM EDT ----- Call - vit D is great!!  Michelle Long

## 2016-01-05 ENCOUNTER — Telehealth: Payer: Self-pay | Admitting: *Deleted

## 2016-01-05 NOTE — Telephone Encounter (Addendum)
Dr. Jacqualyn Posey reviewed 12/04/2015 right 5th toenail biopsy results as benign pigmented lesion. Left message requesting pt call for results. Pt returned my call and I gave her Dr. Leigh Aurora results.  Pt asked if I thought her dtr who has one exactly like it should get it checked and I told her it would be best to be safe.

## 2016-01-10 DIAGNOSIS — R7989 Other specified abnormal findings of blood chemistry: Secondary | ICD-10-CM | POA: Diagnosis not present

## 2016-01-12 ENCOUNTER — Telehealth: Payer: Self-pay | Admitting: Family Medicine

## 2016-01-12 DIAGNOSIS — R748 Abnormal levels of other serum enzymes: Secondary | ICD-10-CM

## 2016-01-12 NOTE — Telephone Encounter (Signed)
Needs insurance referral for appt on 02/14/16 Dr Shana Chute for elevated liver enzymes  Patient request call once done [phone]

## 2016-01-12 NOTE — Telephone Encounter (Signed)
Ok to put referral in 

## 2016-01-12 NOTE — Telephone Encounter (Signed)
Referral ordered

## 2016-01-15 ENCOUNTER — Ambulatory Visit: Payer: Commercial Managed Care - HMO | Admitting: Physician Assistant

## 2016-01-16 ENCOUNTER — Encounter: Payer: Self-pay | Admitting: Family Medicine

## 2016-01-16 DIAGNOSIS — C50911 Malignant neoplasm of unspecified site of right female breast: Secondary | ICD-10-CM

## 2016-01-16 DIAGNOSIS — C50912 Malignant neoplasm of unspecified site of left female breast: Secondary | ICD-10-CM

## 2016-01-16 NOTE — Telephone Encounter (Signed)
[employer] # [id number]

## 2016-01-17 ENCOUNTER — Ambulatory Visit: Payer: Self-pay | Admitting: Hematology & Oncology

## 2016-01-17 ENCOUNTER — Other Ambulatory Visit: Payer: Self-pay

## 2016-02-08 ENCOUNTER — Other Ambulatory Visit: Payer: Self-pay | Admitting: Family Medicine

## 2016-02-14 DIAGNOSIS — K219 Gastro-esophageal reflux disease without esophagitis: Secondary | ICD-10-CM | POA: Diagnosis not present

## 2016-02-14 DIAGNOSIS — Z8371 Family history of colonic polyps: Secondary | ICD-10-CM | POA: Diagnosis not present

## 2016-02-14 DIAGNOSIS — R740 Nonspecific elevation of levels of transaminase and lactic acid dehydrogenase [LDH]: Secondary | ICD-10-CM | POA: Diagnosis not present

## 2016-02-14 DIAGNOSIS — K7581 Nonalcoholic steatohepatitis (NASH): Secondary | ICD-10-CM | POA: Diagnosis not present

## 2016-02-14 DIAGNOSIS — Z86010 Personal history of colonic polyps: Secondary | ICD-10-CM | POA: Diagnosis not present

## 2016-02-14 DIAGNOSIS — K760 Fatty (change of) liver, not elsewhere classified: Secondary | ICD-10-CM | POA: Diagnosis not present

## 2016-03-02 ENCOUNTER — Other Ambulatory Visit: Payer: Self-pay | Admitting: Family Medicine

## 2016-03-06 ENCOUNTER — Ambulatory Visit (INDEPENDENT_AMBULATORY_CARE_PROVIDER_SITE_OTHER): Payer: Commercial Managed Care - HMO | Admitting: Podiatry

## 2016-03-06 ENCOUNTER — Encounter: Payer: Self-pay | Admitting: Podiatry

## 2016-03-06 ENCOUNTER — Telehealth: Payer: Self-pay | Admitting: Family Medicine

## 2016-03-06 DIAGNOSIS — E119 Type 2 diabetes mellitus without complications: Secondary | ICD-10-CM | POA: Diagnosis not present

## 2016-03-06 DIAGNOSIS — Q828 Other specified congenital malformations of skin: Secondary | ICD-10-CM

## 2016-03-06 NOTE — Telephone Encounter (Signed)
error:[id number] ° °

## 2016-03-06 NOTE — Progress Notes (Signed)
Patient ID: Michelle Long, female   DOB: 04-12-1946, 70 y.o.   MRN: [id number]  Subjective: 70 year old female presents the office today for diabetic risk assessment and for painful calluses to both of her feet. She said that she does not her toenails trimmed today. Denies fevers or drainage in the callus sites. No recent injury or trauma. No other complaints. No acute changes. Denies any systemic complaints such as fevers, chills, nausea, vomiting. No acute changes since last appointment.  Objective: AAO x3, NAD DP/PT pulses palpable bilaterally, CRT less than 3 seconds Protective sensation intact with [name] monofilament Hyperkeratotic lesions present bilateral submetatarsal one and 5 and right medial hallux. Upon debridement no underlying ulceration, drainage or other signs of infection. No areas of pinpoint bony tenderness or pain with vibratory sensation. MMT 5/5, ROM WNL. No edema, erythema, increase in warmth to bilateral lower extremities.  No other open lesions or pre-ulcerative lesions.  No pain with calf compression, swelling, warmth, erythema  Assessment: Diabetic risk assessment; symptomatic hyperkeratotic lesions  Plan: -All treatment options discussed with the patient including all alternatives, risks, complications.   -Discussed daily foot inspection. -Calluses debrided 5 without complications or bleeding. Follow-up in 3 months. -Patient encouraged to call the office with any questions, concerns, change in symptoms.   Celesta Gentile, DPM

## 2016-03-12 ENCOUNTER — Other Ambulatory Visit: Payer: Self-pay | Admitting: *Deleted

## 2016-03-12 ENCOUNTER — Other Ambulatory Visit: Payer: Self-pay | Admitting: Hematology & Oncology

## 2016-03-12 MED ORDER — TAMOXIFEN CITRATE 20 MG PO TABS: 20.0000 mg | ORAL_TABLET | Freq: Every day | ORAL | Status: DC

## 2016-04-10 ENCOUNTER — Telehealth: Payer: Self-pay | Admitting: Family Medicine

## 2016-04-10 NOTE — Telephone Encounter (Signed)
°  Relationship to patient: Self Can be reached: [phone] Pharmacy:  [facility] [place] [phone] (Phone) [phone] (Fax)         Reason for call: Patient states that her insurance no longer covers albuterol (PROAIR HFA) 108 (90 BASE) MCG/ACT inhaler [id number] and she needs another inhaler. States that [facility] informed her that they will make an exception if her dr insist that she uses it.

## 2016-04-10 NOTE — Telephone Encounter (Signed)
Please advise.//AB/CMA 

## 2016-04-10 NOTE — Telephone Encounter (Signed)
Please ask pt to call her insurance and ask if they have a preferred inhaler and we will send.

## 2016-04-11 NOTE — Telephone Encounter (Signed)
That is fine to change

## 2016-04-11 NOTE — Telephone Encounter (Signed)
Please advise if Ventolin is appropriate.   KP

## 2016-04-11 NOTE — Telephone Encounter (Signed)
Patient states [employer] advised they will cover 2 canisters of "ventolin" 18 grams if sent to [place]. Please advise

## 2016-04-12 MED ORDER — ALBUTEROL SULFATE HFA 108 (90 BASE) MCG/ACT IN AERS: 2.0000 | INHALATION_SPRAY | Freq: Four times a day (QID) | RESPIRATORY_TRACT | Status: DC | PRN

## 2016-04-12 NOTE — Addendum Note (Signed)
Addended by: Ewing Schlein on: 04/12/2016 05:43 PM   Modules accepted: Orders

## 2016-05-06 ENCOUNTER — Other Ambulatory Visit: Payer: Self-pay | Admitting: Family Medicine

## 2016-05-09 ENCOUNTER — Other Ambulatory Visit: Payer: Self-pay | Admitting: Family Medicine

## 2016-05-09 DIAGNOSIS — Z1231 Encounter for screening mammogram for malignant neoplasm of breast: Secondary | ICD-10-CM

## 2016-05-16 ENCOUNTER — Encounter: Payer: Self-pay | Admitting: Family Medicine

## 2016-05-16 ENCOUNTER — Ambulatory Visit (INDEPENDENT_AMBULATORY_CARE_PROVIDER_SITE_OTHER): Payer: Commercial Managed Care - HMO | Admitting: Family Medicine

## 2016-05-16 VITALS — BP 130/80 | HR 96 | Temp 98.0°F | Resp 16 | Wt 152.2 lb

## 2016-05-16 DIAGNOSIS — J4520 Mild intermittent asthma, uncomplicated: Secondary | ICD-10-CM | POA: Diagnosis not present

## 2016-05-16 DIAGNOSIS — E785 Hyperlipidemia, unspecified: Secondary | ICD-10-CM | POA: Diagnosis not present

## 2016-05-16 DIAGNOSIS — I10 Essential (primary) hypertension: Secondary | ICD-10-CM | POA: Diagnosis not present

## 2016-05-16 DIAGNOSIS — E1165 Type 2 diabetes mellitus with hyperglycemia: Secondary | ICD-10-CM

## 2016-05-16 DIAGNOSIS — E1151 Type 2 diabetes mellitus with diabetic peripheral angiopathy without gangrene: Secondary | ICD-10-CM

## 2016-05-16 DIAGNOSIS — F418 Other specified anxiety disorders: Secondary | ICD-10-CM

## 2016-05-16 DIAGNOSIS — K219 Gastro-esophageal reflux disease without esophagitis: Secondary | ICD-10-CM

## 2016-05-16 DIAGNOSIS — IMO0002 Reserved for concepts with insufficient information to code with codable children: Secondary | ICD-10-CM

## 2016-05-16 LAB — POCT URINALYSIS DIPSTICK
Bilirubin, UA: NEGATIVE
Blood, UA: NEGATIVE
Glucose, UA: NEGATIVE
Ketones, UA: NEGATIVE
Leukocytes, UA: NEGATIVE
Nitrite, UA: NEGATIVE
Protein, UA: NEGATIVE
Spec Grav, UA: >=1.03
Urobilinogen, UA: 0.2
pH, UA: 6

## 2016-05-16 LAB — COMPREHENSIVE METABOLIC PANEL
ALT: 42 U/L — ABNORMAL HIGH (ref 0–35)
AST: 49 U/L — ABNORMAL HIGH (ref 0–37)
Albumin: 4.1 g/dL (ref 3.5–5.2)
Alkaline Phosphatase: 36 U/L — ABNORMAL LOW (ref 39–117)
BUN: 11 mg/dL (ref 6–23)
CO2: 26 mEq/L (ref 19–32)
Calcium: 9.4 mg/dL (ref 8.4–10.5)
Chloride: 103 mEq/L (ref 96–112)
Creatinine, Ser: 0.79 mg/dL (ref 0.40–1.20)
GFR: 76.45 mL/min (ref >60.00)
Glucose, Bld: 158 mg/dL — ABNORMAL HIGH (ref 70–99)
Potassium: 4 mEq/L (ref 3.5–5.1)
Sodium: 140 mEq/L (ref 135–145)
Total Bilirubin: 0.4 mg/dL (ref 0.2–1.2)
Total Protein: 6.8 g/dL (ref 6.0–8.3)

## 2016-05-16 LAB — MICROALBUMIN / CREATININE URINE RATIO
Creatinine,U: 163.7 mg/dL
Microalb Creat Ratio: 0.9 mg/g (ref 0.0–30.0)
Microalb, Ur: 1.4 mg/dL (ref 0.0–1.9)

## 2016-05-16 LAB — LIPID PANEL
Cholesterol: 201 mg/dL — ABNORMAL HIGH (ref 0–200)
HDL: 42.6 mg/dL (ref >39.00)
LDL Cholesterol: 121 mg/dL — ABNORMAL HIGH (ref 0–99)
NonHDL: 158.73
Total CHOL/HDL Ratio: 5
Triglycerides: 191 mg/dL — ABNORMAL HIGH (ref 0.0–149.0)
VLDL: 38.2 mg/dL (ref 0.0–40.0)

## 2016-05-16 LAB — HEMOGLOBIN A1C: Hgb A1c MFr Bld: 6.6 % — ABNORMAL HIGH (ref 4.6–6.5)

## 2016-05-16 MED ORDER — LORAZEPAM 0.5 MG PO TABS: 0.5000 mg | ORAL_TABLET | Freq: Two times a day (BID) | ORAL | 0 refills | Status: DC | PRN

## 2016-05-16 MED ORDER — LOSARTAN POTASSIUM 50 MG PO TABS: 50.0000 mg | ORAL_TABLET | Freq: Every day | ORAL | 1 refills | Status: DC

## 2016-05-16 MED ORDER — PANTOPRAZOLE SODIUM 40 MG PO TBEC
40.0000 mg | DELAYED_RELEASE_TABLET | Freq: Every day | ORAL | 0 refills | Status: DC
Start: 2016-05-16 — End: 2016-09-21

## 2016-05-16 MED ORDER — BUDESONIDE-FORMOTEROL FUMARATE 160-4.5 MCG/ACT IN AERO: 2.0000 | INHALATION_SPRAY | Freq: Two times a day (BID) | RESPIRATORY_TRACT | 1 refills | Status: DC

## 2016-05-16 MED ORDER — ALBUTEROL SULFATE HFA 108 (90 BASE) MCG/ACT IN AERS: 2.0000 | INHALATION_SPRAY | Freq: Four times a day (QID) | RESPIRATORY_TRACT | 1 refills | Status: DC | PRN

## 2016-05-16 MED ORDER — METFORMIN HCL 500 MG PO TABS: 500.0000 mg | ORAL_TABLET | Freq: Two times a day (BID) | ORAL | 0 refills | Status: DC

## 2016-05-16 MED ORDER — CITALOPRAM HYDROBROMIDE 10 MG PO TABS: 5.0000 mg | ORAL_TABLET | Freq: Every day | ORAL | 1 refills | Status: DC

## 2016-05-16 NOTE — Assessment & Plan Note (Signed)
Check labs Statin intolerant

## 2016-05-16 NOTE — Patient Instructions (Signed)

## 2016-05-16 NOTE — Progress Notes (Signed)
Pre visit review using our clinic review tool, if applicable. No additional management support is needed unless otherwise documented below in the visit note. 

## 2016-05-16 NOTE — Progress Notes (Signed)
Patient ID: [name], female    DOB: October 07, 1946  Age: 70 y.o. MRN: [id number]    Subjective:  Subjective  HPI Michelle Long presents for f/u dm, cholesterol and htn.  HPI HYPERTENSION   Blood pressure range--not checking   Chest pain- no      Dyspnea- no Lightheadedness- no   Edema- no  Other side effects - no   Medication compliance: good Low salt diet- yes    DIABETES    Blood Sugar ranges-good per pt  Polyuria- no New Visual problems- no  Hypoglycemic symptoms- no  Other side effects-no Medication compliance - good Last eye exam- due Foot exam- today   HYPERLIPIDEMIA  Medication compliance- no RUQ pain- no  Muscle aches- no Other side effects-no     Review of Systems  Constitutional: Negative for appetite change, diaphoresis, fatigue and unexpected weight change.  Eyes: Negative for pain, redness and visual disturbance.  Respiratory: Negative for cough, chest tightness, shortness of breath and wheezing.   Cardiovascular: Negative for chest pain, palpitations and leg swelling.  Endocrine: Negative for cold intolerance, heat intolerance, polydipsia, polyphagia and polyuria.  Genitourinary: Negative for difficulty urinating, dysuria and frequency.  Neurological: Negative for dizziness, light-headedness, numbness and headaches.    History Past Medical History:  Diagnosis Date  . Asthma   . Bipolar disorder ([place])   . Bladder cancer ([place])   . Breast cancer ([place])   . Cancer of lower-inner quadrant of left female breast ([place]) 12/08/2013  . Depression   . Diabetes mellitus   . GERD (gastroesophageal reflux disease)   . Hepatitis B infection   . History of transfusion of whole blood    Approximately 2009, due to breast cancer/chemo.  Marland Kitchen Hyperlipidemia     She has a past surgical history that includes Tonsillectomy (1951); Appendectomy (1961); Partial hysterectomy (1991); bladder cancer (2006); Breast lumpectomy (2007); and Abdominal hysterectomy.   Her  family history includes Arthritis in her mother; Breast cancer in her mother; Cancer in her father; Diabetes in her father; Heart disease in her father; Hypertension in her mother; Throat cancer in her father; Transient ischemic attack in her mother.She reports that she quit smoking about 17 years ago. Her smoking use included Cigarettes. She started smoking about 51 years ago. She has a 49.50 pack-year smoking history. She does not have any smokeless tobacco history on file. She reports that she drinks alcohol. She reports that she does not use drugs.  Current Outpatient Prescriptions on File Prior to Visit  Medication Sig Dispense Refill  . aspirin 81 MG tablet Take 81 mg by mouth daily.    . Blood Glucose Monitoring Suppl (TRUE METRIX AIR GLUCOSE METER) DEVI     . Calcium Carb-Cholecalciferol (CALCIUM + D3 PO) Take by mouth.    . Cholecalciferol (VITAMIN D) 2000 units CAPS Take 1 capsule by mouth daily.    . fish oil-omega-3 fatty acids 1000 MG capsule Take 1 g by mouth daily.      Marland Kitchen glucose blood (TRUE METRIX BLOOD GLUCOSE TEST) test strip Check your blood sugar once daily. Dx code:E11.9 100 each 3  . OLANZapine (ZYPREXA) 5 MG tablet Take 5 mg by mouth daily.    . tamoxifen (NOLVADEX) 20 MG tablet Take 1 tablet (20 mg total) by mouth daily. 30 tablet 0   No current facility-administered medications on file prior to visit.      Objective:  Objective  Physical Exam  Constitutional: She is oriented to person, place,  and time. She appears well-developed and well-nourished.  HENT:  Head: Normocephalic and atraumatic.  Eyes: Conjunctivae and EOM are normal.  Neck: Normal range of motion. Neck supple. No JVD present. Carotid bruit is not present. No thyromegaly present.  Cardiovascular: Normal rate, regular rhythm and normal heart sounds.   No murmur heard. Pulmonary/Chest: Effort normal and breath sounds normal. No respiratory distress. She has no wheezes. She has no rales. She exhibits no  tenderness.  Musculoskeletal: She exhibits no edema.  Neurological: She is alert and oriented to person, place, and time.  Psychiatric: She has a normal mood and affect.  Nursing note and vitals reviewed. Sensory exam of the foot is normal, tested with the monofilament. Good pulses, no lesions or ulcers, good peripheral pulses.  BP 130/80 (BP Location: Right Arm, Patient Position: Sitting)   Pulse 96   Temp 98 F (36.7 C)   Resp 16   Wt 152 lb 3.2 oz (69 kg)   SpO2 95%   BMI 26.13 kg/m  Wt Readings from Last 3 Encounters:  05/16/16 152 lb 3.2 oz (69 kg)  01/01/16 157 lb (71.2 kg)  11/17/15 155 lb 12.8 oz (70.7 kg)     Lab Results  Component Value Date   WBC 6.7 01/01/2016   HGB 14.1 01/01/2016   HCT 41.7 01/01/2016   PLT 146 01/01/2016   GLUCOSE 163 (H) 01/01/2016   CHOL 173 11/17/2015   TRIG 149.0 11/17/2015   HDL 35.70 (L) 11/17/2015   LDLDIRECT 145.6 06/22/2013   LDLCALC 107 (H) 11/17/2015   ALT 46 (H) 01/01/2016   AST 48 (H) 01/01/2016   NA 135 01/01/2016   K 4.1 01/01/2016   CL 101 01/01/2016   CREATININE 0.62 01/01/2016   BUN 8 01/01/2016   CO2 25 01/01/2016   TSH 0.80 01/30/2015   INR 0.97 09/25/2011   HGBA1C 6.7 (H) 11/17/2015   MICROALBUR 2.4 (H) 10/22/2013    Dg Bone Density  Result Date: 11/27/2015 EXAM: DUAL X-RAY ABSORPTIOMETRY (DXA) FOR BONE MINERAL DENSITY IMPRESSION: Referring Physician:  Rosalita Chessman PATIENT: Name: Michelle Long, Michelle Long Patient ID: [id number] Birth Date: 1946/03/27 Height: 63.0 in. Sex: Female Measured: 11/27/2015 Weight: 155.0 lbs. Indications: Caucasian, Chemotherapy for Cancer, Estrogen Deficiency, Family Hx of Osteoporosis, History of Osteopenia, Hysterectomy, Post Menopausal, Previous Tobacco User, Family Hist. (Parent hip fracture) Fractures: Treatments: Metformin, Tamoxifen ASSESSMENT: The BMD measured at Femur Neck Left is 0.889 g/cm2 with a T-score of -1.1. This patient is considered osteopenic according to [place]  [facility]) criteria. Site Region Measured Date Measured Age WHO YA BMD Classification T-score AP Spine L1-L4 11/27/2015 69.6 Normal 0.4 1.227 g/cm2 DualFemur Neck Left 11/27/2015 69.6 [age] Osteopenia -1.1 0.889 g/cm2 World Health Organization [facility]) criteria for post-menopausal, Caucasian Women: Normal       T-score at or above -1 SD Osteopenia   T-score between -1 and -2.5 SD Osteoporosis T-score at or below -2.5 SD RECOMMENDATION: [place] recommends that FDA-approved medical therapies be considered in postmenopausal women and men age 44 or older with a: 1. Hip or vertebral (clinical or morphometric) fracture. 2. T-score of < -2.5 at the spine or hip. 3. Ten-year fracture probability by FRAX of 3% or greater for hip fracture or 20% or greater for major osteoporotic fracture. All treatment decisions require clinical judgment and consideration of individual patient factors, including patient preferences, co-morbidities, previous drug use, risk factors not captured in the FRAX model (e.g. falls, vitamin D deficiency, increased bone turnover, interval  significant decline in bone density) and possible under - or over-estimation of fracture risk by FRAX. All patients should ensure an adequate intake of dietary calcium (1200 mg/d) and vitamin D (800 IU daily) unless contraindicated. FOLLOW-UP: People with diagnosed cases of osteoporosis or at high risk for fracture should have regular bone mineral density tests. For patients eligible for Medicare, routine testing is allowed once every 2 years. The testing frequency can be increased to one year for patients who have rapidly progressing disease, those who are receiving or discontinuing medical therapy to restore bone mass, or have additional risk factors. I have reviewed this report and agree with the above findings. [facility] Patient: Brent Bulla Referring Physician: Rosalita Chessman Birth Date: [birth year] [age] Patient ID:  [id number] Height: 63.0 in. Weight: 155.0 lbs. Measured: 11/27/2015 2:23:21 PM (16 SP 2) Sex: Female Ethnicity: White Analyzed: 11/27/2015 2:26:20 PM (16 SP 2) FRAX* 10-year Probability of Fracture Based on femoral neck BMD: DualFemur (Left) Major Osteoporotic Fracture: 14.1% Hip Fracture:                1.9% Population:                  [place] (Caucasian) Risk Factors:                Family Hist. (Parent hip fracture) *FRAX is a [place] [employer] for Metabolic Bone Disease, a [place] (WHO) [facility]. ASSESSMENT: The probability of a major osteoporotic fracture is 14.1% within the next ten years. The probability of a hip fracture is 1.9% within the next ten years. Electronically Signed   By: David  Martinique M.D.   On: 11/27/2015 14:49     Assessment & Plan:  Plan  I have changed Michelle Long's SYMBICORT to budesonide-formoterol. I have also changed her losartan and pantoprazole. I am also having her maintain her fish oil-omega-3 fatty acids, aspirin, OLANZapine, TRUE METRIX AIR GLUCOSE METER, Calcium Carb-Cholecalciferol (CALCIUM + D3 PO), Vitamin D, glucose blood, tamoxifen, LORazepam, metFORMIN, citalopram, and albuterol.  Meds ordered this encounter  Medications  . losartan (COZAAR) 50 MG tablet    Sig: Take 1 tablet (50 mg total) by mouth daily.    Dispense:  90 tablet    Refill:  1  . LORazepam (ATIVAN) 0.5 MG tablet    Sig: Take 1 tablet (0.5 mg total) by mouth 2 (two) times daily as needed.    Dispense:  90 tablet    Refill:  0  . metFORMIN (GLUCOPHAGE) 500 MG tablet    Sig: Take 1 tablet (500 mg total) by mouth 2 (two) times daily with a meal. Repeat labs are due now    Dispense:  180 tablet    Refill:  0  . pantoprazole (PROTONIX) 40 MG tablet    Sig: Take 1 tablet (40 mg total) by mouth daily.    Dispense:  90 tablet    Refill:  0  . citalopram (CELEXA) 10 MG tablet    Sig: Take 0.5 tablets (5 mg total) by mouth at  bedtime. Taking 5 mg. Only.    Dispense:  90 tablet    Refill:  1  . budesonide-formoterol (SYMBICORT) 160-4.5 MCG/ACT inhaler    Sig: Inhale 2 puffs into the lungs 2 (two) times daily.    Dispense:  3 Inhaler    Refill:  1  . albuterol (PROVENTIL HFA) 108 (90  Base) MCG/ACT inhaler    Sig: Inhale 2 puffs into the lungs every 6 (six) hours as needed for wheezing or shortness of breath.    Dispense:  3 Inhaler    Refill:  1    Please fill the Ventolin inhaler    Problem List Items Addressed This Visit      Unprioritized   GERD (gastroesophageal reflux disease)   Relevant Medications   pantoprazole (PROTONIX) 40 MG tablet   HTN (hypertension)    Stable con't meds      Relevant Medications   losartan (COZAAR) 50 MG tablet   Other Relevant Orders   Comprehensive metabolic panel   Microalbumin / creatinine urine ratio   POCT urinalysis dipstick   Hyperlipidemia - Primary    Check labs Statin intolerant      Relevant Medications   losartan (COZAAR) 50 MG tablet   Other Relevant Orders   Lipid panel   Comprehensive metabolic panel   Microalbumin / creatinine urine ratio   POCT urinalysis dipstick   Inadequately controlled diabetes mellitus (HCC)    Check labs  con't meds      Relevant Medications   losartan (COZAAR) 50 MG tablet   metFORMIN (GLUCOPHAGE) 500 MG tablet    Other Visit Diagnoses    DM (diabetes mellitus) type II uncontrolled, periph vascular disorder (HCC)       Relevant Medications   losartan (COZAAR) 50 MG tablet   metFORMIN (GLUCOPHAGE) 500 MG tablet   Other Relevant Orders   Hemoglobin A1c   Comprehensive metabolic panel   Microalbumin / creatinine urine ratio   POCT urinalysis dipstick   Other specified anxiety disorders       Relevant Medications   LORazepam (ATIVAN) 0.5 MG tablet   Asthma, chronic, mild intermittent, uncomplicated       Relevant Medications   citalopram (CELEXA) 10 MG tablet   budesonide-formoterol (SYMBICORT) 160-4.5  MCG/ACT inhaler   albuterol (PROVENTIL HFA) 108 (90 Base) MCG/ACT inhaler      Follow-up: Return in about 6 months (around 11/16/2016) for hypertension, hyperlipidemia, diabetes II.  Ann Held, DO

## 2016-05-16 NOTE — Assessment & Plan Note (Signed)
Check labs con't meds 

## 2016-05-16 NOTE — Assessment & Plan Note (Signed)
Stable con't meds 

## 2016-05-17 ENCOUNTER — Encounter: Payer: Self-pay | Admitting: Family Medicine

## 2016-05-23 ENCOUNTER — Encounter: Payer: Self-pay | Admitting: Family Medicine

## 2016-05-23 DIAGNOSIS — Z79899 Other long term (current) drug therapy: Secondary | ICD-10-CM

## 2016-05-23 DIAGNOSIS — Z0100 Encounter for examination of eyes and vision without abnormal findings: Secondary | ICD-10-CM

## 2016-05-23 DIAGNOSIS — Z09 Encounter for follow-up examination after completed treatment for conditions other than malignant neoplasm: Secondary | ICD-10-CM

## 2016-05-23 DIAGNOSIS — Z8551 Personal history of malignant neoplasm of bladder: Secondary | ICD-10-CM

## 2016-05-24 NOTE — Telephone Encounter (Signed)
Ok for referrals  

## 2016-05-24 NOTE — Telephone Encounter (Signed)
Please advise      KP 

## 2016-05-24 NOTE — Telephone Encounter (Signed)
Referral have been placed.     KP

## 2016-06-06 DIAGNOSIS — F250 Schizoaffective disorder, bipolar type: Secondary | ICD-10-CM | POA: Diagnosis not present

## 2016-06-14 ENCOUNTER — Ambulatory Visit
Admission: RE | Admit: 2016-06-14 | Discharge: 2016-06-14 | Disposition: A | Discharge status: Home / Self Care | Payer: Commercial Managed Care - HMO | Source: Ambulatory Visit | Attending: Family Medicine | Admitting: Family Medicine

## 2016-06-14 ENCOUNTER — Ambulatory Visit: Payer: Commercial Managed Care - HMO

## 2016-06-14 DIAGNOSIS — Z1231 Encounter for screening mammogram for malignant neoplasm of breast: Secondary | ICD-10-CM | POA: Diagnosis not present

## 2016-06-14 IMAGING — MG 2D DIGITAL SCREENING BILATERAL MAMMOGRAM WITH CAD AND ADJUNCT TO
9 of 12 series · 9 of 28 positions shown · non-contrast
Comparison: Previous exam(s).

CLINICAL DATA: Screening. History of left breast cancer in [X2]
status post lumpectomy and radiation therapy.

EXAM:
2D DIGITAL SCREENING BILATERAL MAMMOGRAM WITH CAD AND ADJUNCT TOMO

[R CC synth-2D]
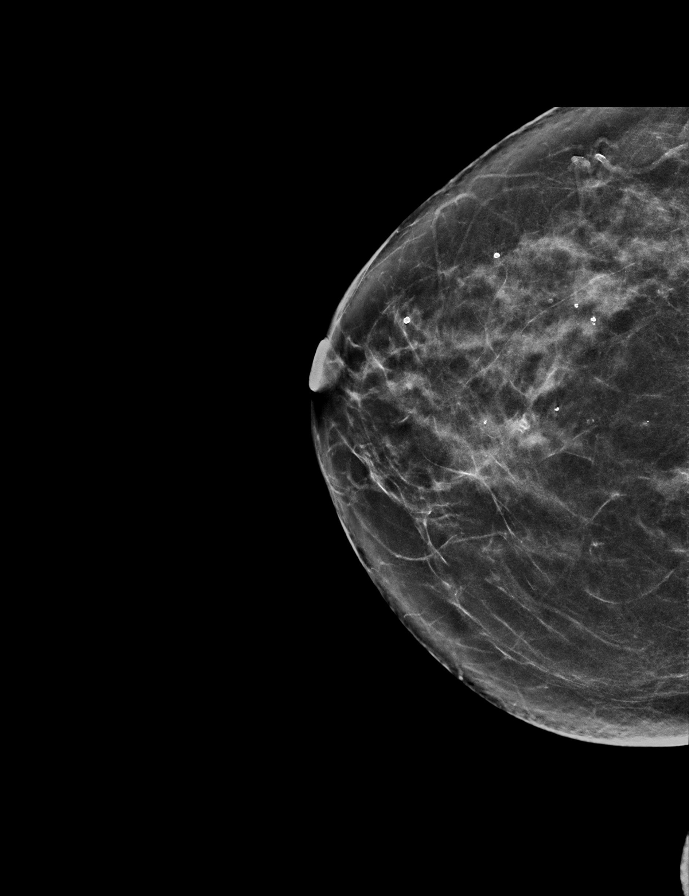

[L CC synth-2D]
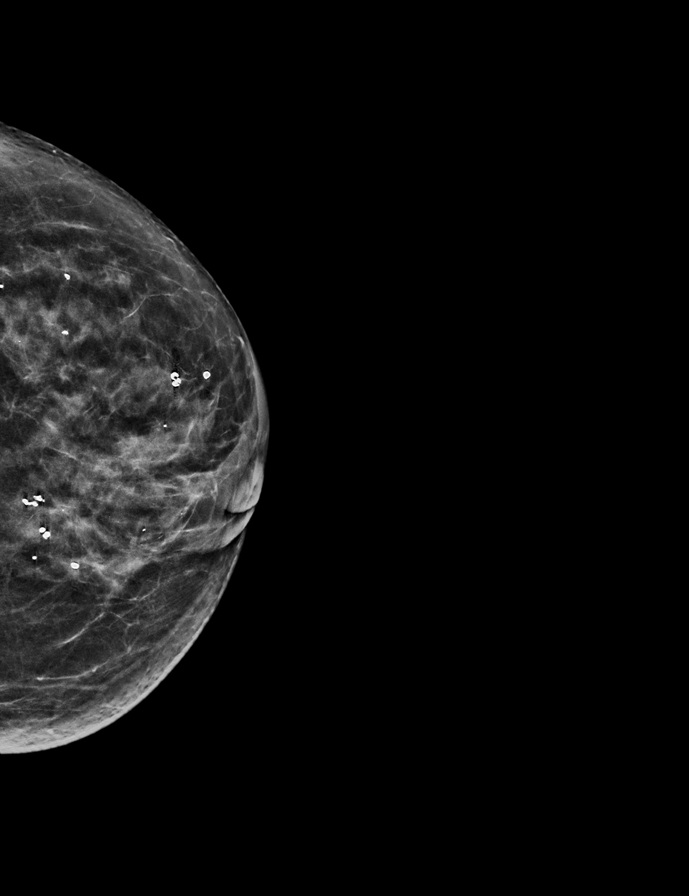

[L CC]
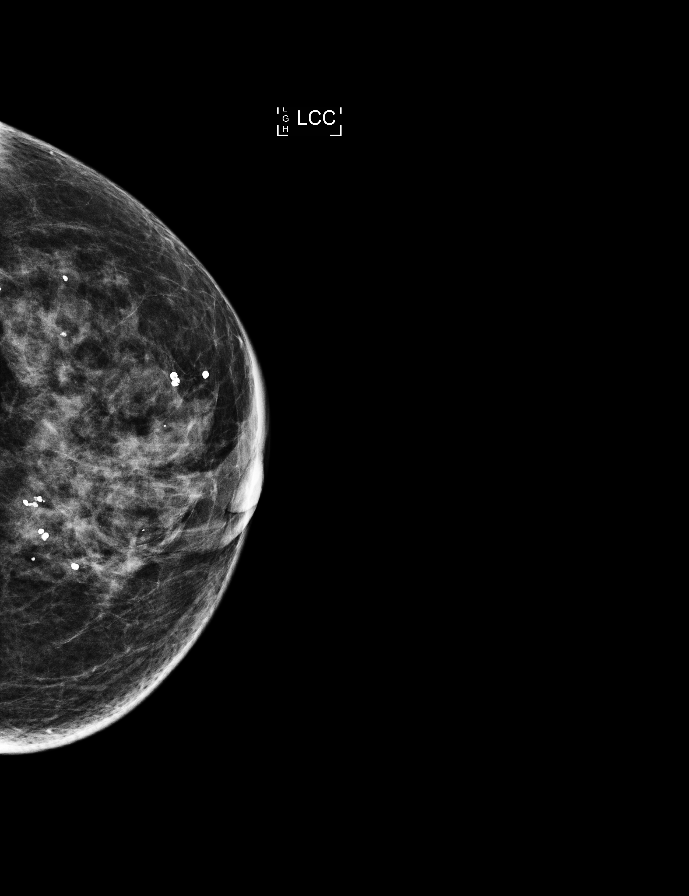

[R MLO]
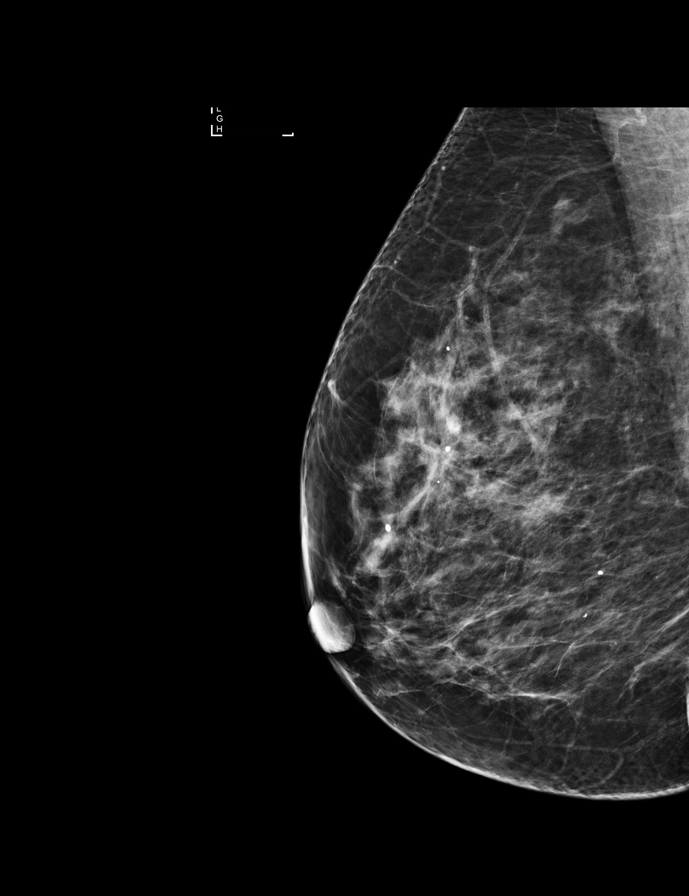

[R CC]
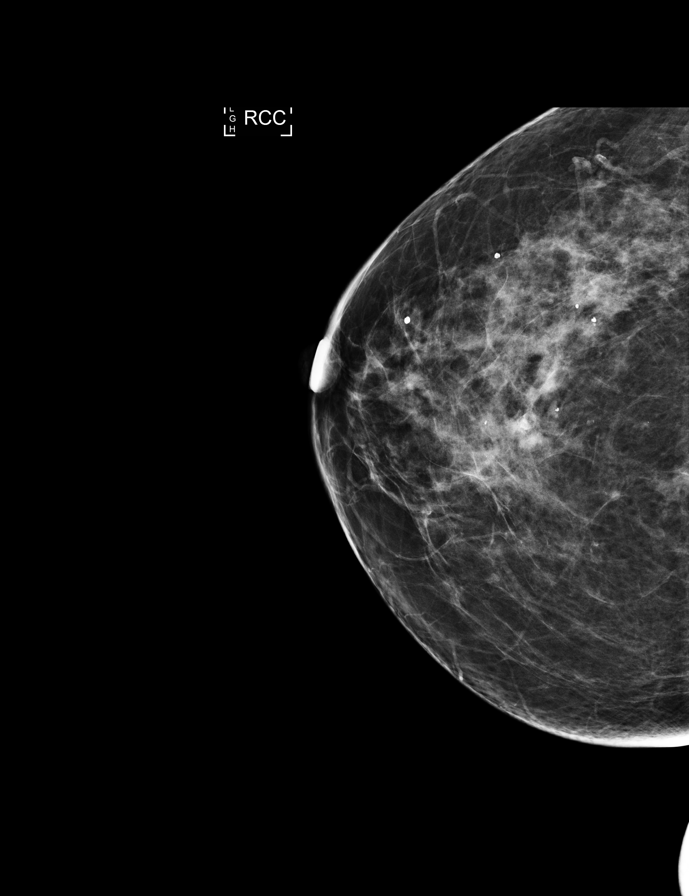

[L MLO]
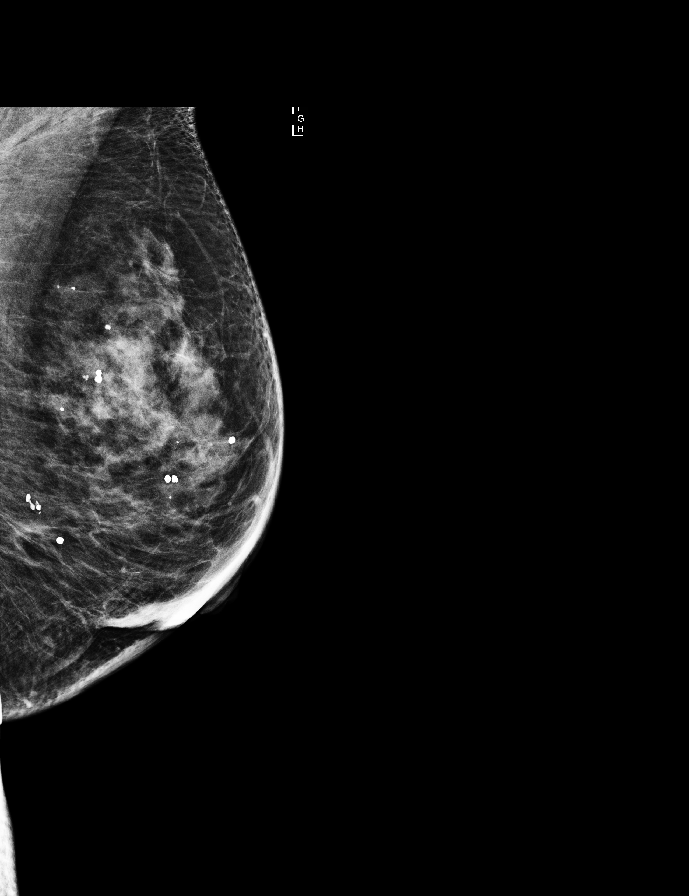

[R MLO synth-2D]
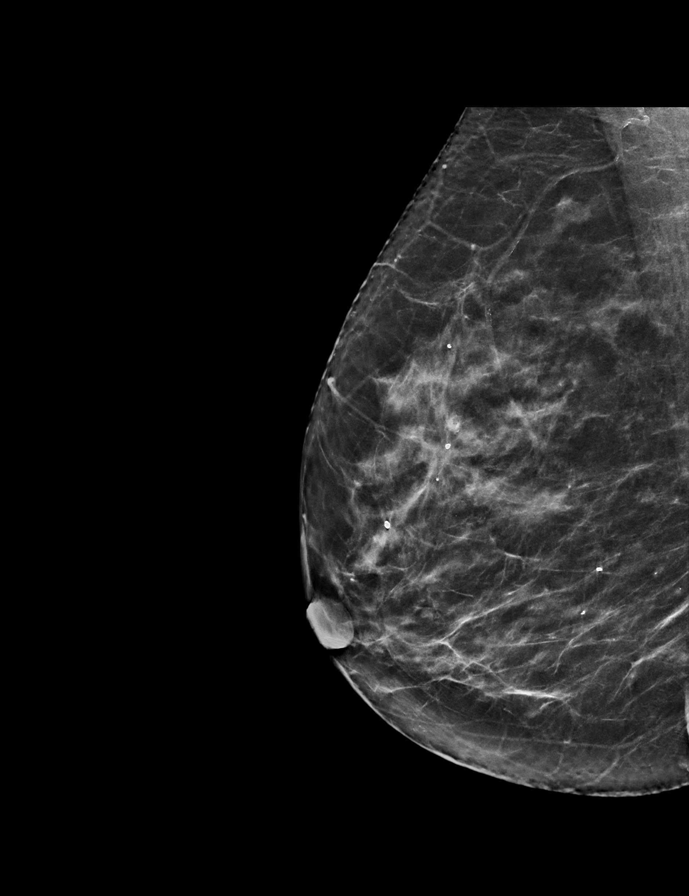

[L MLO synth-2D]
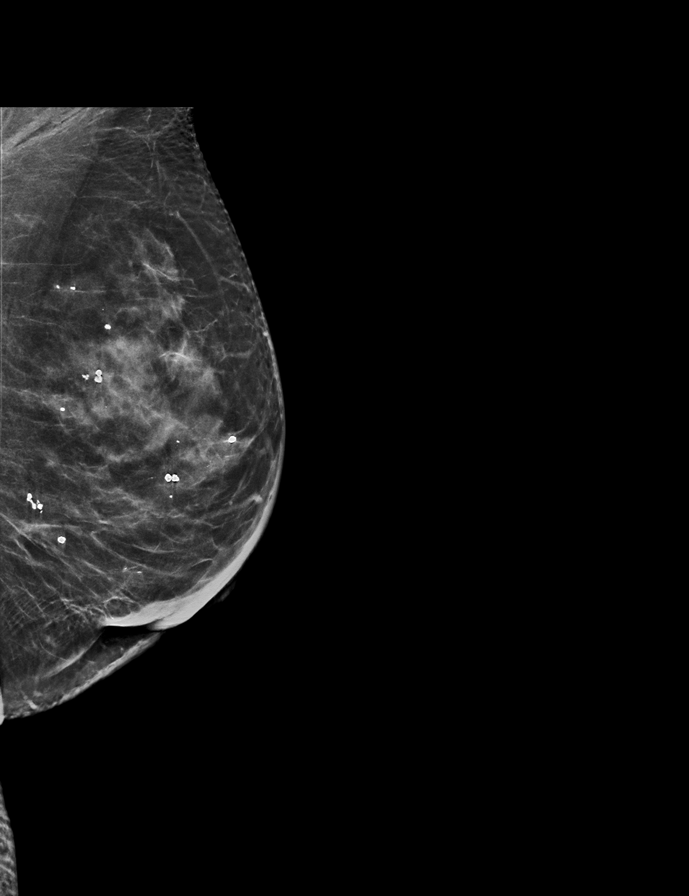

[L CC tomo · tomo slice 27/54.0]
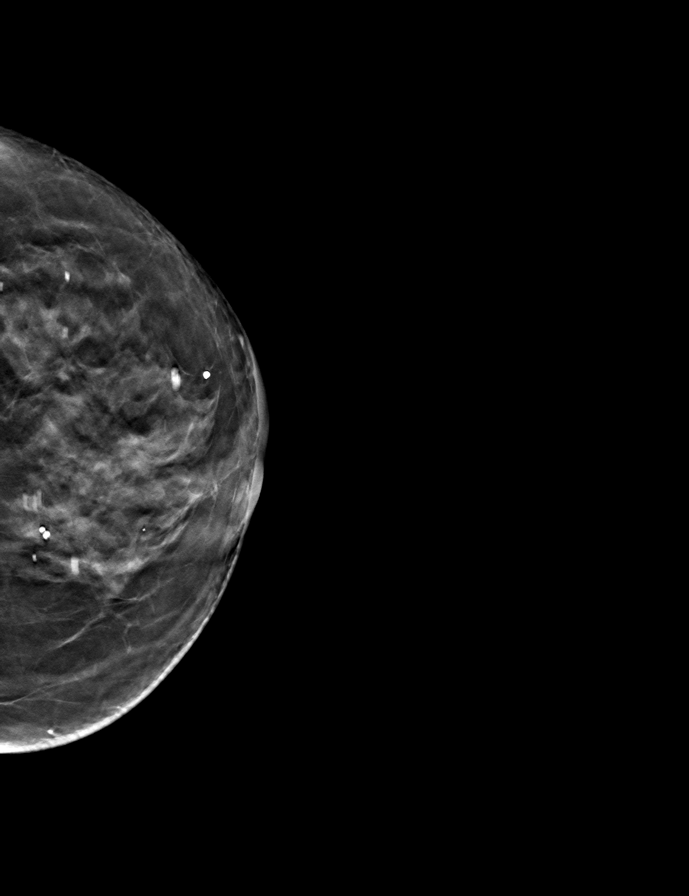

[9 of 28 positions shown; findings below may reference images not displayed]

ACR Breast Density Category c: The breast tissue is heterogeneously
dense, which may obscure small masses.
FINDINGS: There are stable postsurgical changes within the left breast. There
are no findings suspicious for malignancy within either breast.
Images were processed with CAD.
IMPRESSION: No mammographic evidence of malignancy. A result letter of this
screening mammogram will be mailed directly to the patient.

RECOMMENDATION:
Screening mammogram in one year. (Code:[X2])

BI-RADS CATEGORY  2: Benign.

## 2016-07-03 ENCOUNTER — Encounter: Payer: Self-pay | Admitting: Hematology & Oncology

## 2016-07-03 ENCOUNTER — Other Ambulatory Visit (HOSPITAL_BASED_OUTPATIENT_CLINIC_OR_DEPARTMENT_OTHER): Payer: Commercial Managed Care - HMO

## 2016-07-03 ENCOUNTER — Ambulatory Visit (HOSPITAL_BASED_OUTPATIENT_CLINIC_OR_DEPARTMENT_OTHER): Payer: Commercial Managed Care - HMO

## 2016-07-03 ENCOUNTER — Ambulatory Visit (HOSPITAL_BASED_OUTPATIENT_CLINIC_OR_DEPARTMENT_OTHER): Payer: Commercial Managed Care - HMO | Admitting: Hematology & Oncology

## 2016-07-03 VITALS — BP 121/68 | HR 95 | Temp 98.2°F | Resp 16 | Ht 64.0 in | Wt 152.8 lb

## 2016-07-03 DIAGNOSIS — C50312 Malignant neoplasm of lower-inner quadrant of left female breast: Secondary | ICD-10-CM

## 2016-07-03 DIAGNOSIS — Z23 Encounter for immunization: Secondary | ICD-10-CM

## 2016-07-03 DIAGNOSIS — Z79810 Long term (current) use of selective estrogen receptor modulators (SERMs): Secondary | ICD-10-CM | POA: Diagnosis not present

## 2016-07-03 DIAGNOSIS — E559 Vitamin D deficiency, unspecified: Secondary | ICD-10-CM | POA: Diagnosis not present

## 2016-07-03 LAB — CBC WITH DIFFERENTIAL (CANCER CENTER ONLY)
BASO#: 0 10*3/uL (ref 0.0–0.2)
BASO%: 0.4 % (ref 0.0–2.0)
EOS%: 2.6 % (ref 0.0–7.0)
Eosinophils Absolute: 0.1 10*3/uL (ref 0.0–0.5)
HCT: 41.4 % (ref 34.8–46.6)
HGB: 13.7 g/dL (ref 11.6–15.9)
LYMPH#: 1.9 10*3/uL (ref 0.9–3.3)
LYMPH%: 34.4 % (ref 14.0–48.0)
MCH: 31.6 pg (ref 26.0–34.0)
MCHC: 33.1 g/dL (ref 32.0–36.0)
MCV: 95 fL (ref 81–101)
MONO#: 0.5 10*3/uL (ref 0.1–0.9)
MONO%: 9.3 % (ref 0.0–13.0)
NEUT#: 2.9 10*3/uL (ref 1.5–6.5)
NEUT%: 53.3 % (ref 39.6–80.0)
Platelets: 143 10*3/uL — ABNORMAL LOW (ref 145–400)
RBC: 4.34 10*6/uL (ref 3.70–5.32)
RDW: 12.4 % (ref 11.1–15.7)
WBC: 5.4 10*3/uL (ref 3.9–10.0)

## 2016-07-03 LAB — COMPREHENSIVE METABOLIC PANEL
ALT: 54 U/L (ref 0–55)
AST: 54 U/L — ABNORMAL HIGH (ref 5–34)
Albumin: 3.8 g/dL (ref 3.5–5.0)
Alkaline Phosphatase: 45 U/L (ref 40–150)
Anion Gap: 11 mEq/L (ref 3–11)
BUN: 13.3 mg/dL (ref 7.0–26.0)
CO2: 25 mEq/L (ref 22–29)
Calcium: 9.1 mg/dL (ref 8.4–10.4)
Chloride: 107 mEq/L (ref 98–109)
Creatinine: 0.8 mg/dL (ref 0.6–1.1)
EGFR: 73 mL/min/{1.73_m2} — ABNORMAL LOW (ref >90)
Glucose: 126 mg/dl (ref 70–140)
Potassium: 4.1 mEq/L (ref 3.5–5.1)
Sodium: 142 mEq/L (ref 136–145)
Total Bilirubin: 0.37 mg/dL (ref 0.20–1.20)
Total Protein: 6.9 g/dL (ref 6.4–8.3)

## 2016-07-03 MED ORDER — INFLUENZA VAC SPLIT QUAD 0.5 ML IM SUSY
0.5000 mL | PREFILLED_SYRINGE | Freq: Once | INTRAMUSCULAR | Status: AC
  Administered 2016-07-03: 0.5 mL via INTRAMUSCULAR
  Filled 2016-07-03: qty 0.5

## 2016-07-03 NOTE — Progress Notes (Signed)
Hematology and Oncology Follow Up Visit  Michelle Long [id number] 12-Feb-1946 70 y.o. 07/03/2016   Principle Diagnosis:   Stage IIA (T2 N0M0) infiltrating ductal carcinoma of the left breast  Current Therapy:    Tamoxifen 20 mg by mouth daily - to finish in 2017.     Interim History:  Ms.  Long is in for followup. We see her every 6 months. She has been doing well. She has had a good summer. She is background to Michelle Long to see her daughter. She is looking Michelle Long to this.  She's had no problems with nausea or vomiting. She's had no change in bowel or bladder habits.  She does have some osteopenia. Her family doctor is following her quite closely. She is taking calcium and vitamin D.  She's had no leg swelling. She's had no visual difficulties. She's had no headache.  Last mammogram was in October. Everything looked fantastic.   Overall, her performance status is ECOG 0.   Medications:  Current Outpatient Prescriptions:  .  albuterol (PROVENTIL HFA) 108 (90 Base) MCG/ACT inhaler, Inhale 2 puffs into the lungs every 6 (six) hours as needed for wheezing or shortness of breath., Disp: 3 Inhaler, Rfl: 1 .  aspirin 81 MG tablet, Take 81 mg by mouth daily., Disp: , Rfl:  .  Blood Glucose Monitoring Suppl (TRUE METRIX AIR GLUCOSE METER) DEVI, , Disp: , Rfl:  .  budesonide-formoterol (SYMBICORT) 160-4.5 MCG/ACT inhaler, Inhale 2 puffs into the lungs 2 (two) times daily., Disp: 3 Inhaler, Rfl: 1 .  Calcium Carb-Cholecalciferol (CALCIUM + D3 PO), Take by mouth., Disp: , Rfl:  .  Cholecalciferol (VITAMIN D) 2000 units CAPS, Take 1 capsule by mouth daily., Disp: , Rfl:  .  citalopram (CELEXA) 10 MG tablet, Take 0.5 tablets (5 mg total) by mouth at bedtime. Taking 5 mg. Only., Disp: 90 tablet, Rfl: 1 .  fish oil-omega-3 fatty acids 1000 MG capsule, Take 1 g by mouth daily.  , Disp: , Rfl:  .  glucose blood (TRUE METRIX BLOOD GLUCOSE TEST) test strip, Check your blood sugar once daily. Dx  code:E11.9, Disp: 100 each, Rfl: 3 .  LORazepam (ATIVAN) 0.5 MG tablet, Take 1 tablet (0.5 mg total) by mouth 2 (two) times daily as needed., Disp: 90 tablet, Rfl: 0 .  losartan (COZAAR) 50 MG tablet, Take 1 tablet (50 mg total) by mouth daily., Disp: 90 tablet, Rfl: 1 .  metFORMIN (GLUCOPHAGE) 500 MG tablet, Take 1 tablet (500 mg total) by mouth 2 (two) times daily with a meal. Repeat labs are due now, Disp: 180 tablet, Rfl: 0 .  OLANZapine (ZYPREXA) 5 MG tablet, Take 5 mg by mouth daily., Disp: , Rfl:  .  pantoprazole (PROTONIX) 40 MG tablet, Take 1 tablet (40 mg total) by mouth daily., Disp: 90 tablet, Rfl: 0 .  tamoxifen (NOLVADEX) 20 MG tablet, Take 1 tablet (20 mg total) by mouth daily., Disp: 30 tablet, Rfl: 0  Allergies:  Allergies  Allergen Reactions  . Lamictal [Lamotrigine]     itch  . Prozac [Fluoxetine Hcl]     seizure  . Statins Other (See Comments)    Muscle pains  . Sulfa Drugs Cross Reactors     Dyspnea     Past Medical History, Surgical history, Social history, and Family History were reviewed and updated.  Review of Systems: As above  Physical Exam:  height is 5\' 4"  (1.626 m) and weight is 152 lb 12.8 oz (69.3 kg). Her oral temperature  is 98.2 F (36.8 C). Her blood pressure is 121/68 and her pulse is 95. Her respiration is 16.   Well-developed well-nourished white female. She is no ocular or oral lesions. There is no palpable cervical or supraclavicular lymph nodes. Lungs are clear folic. There are no rales wheezes or rhonchi. Cardiac exam regular rate and rhythm with no murmurs rubs or bruits. Breast exam shows right breast no masses edema or erythema. There is no right axillary adenopathy. Left breast shows a well-healed lumpectomy at about the 6:00 position. There is some contraction of the left breast. There's no mass. There is no left axillary adenopathy. Abdomen is soft. She has good bowel sounds. There is no fluid wave. There is no palpable hepato-  splenomegaly back exam no tenderness over the spine ribs or hips. She has no kyphosis. Extremities shows some osteoarthritic changes. Has good strength. Skin exam no rashes. Neurological exam no focal neurological deficits.  Lab Results  Component Value Date   WBC 5.4 07/03/2016   HGB 13.7 07/03/2016   HCT 41.4 07/03/2016   MCV 95 07/03/2016   PLT 143 (L) 07/03/2016     Chemistry      Component Value Date/Time   NA 140 05/16/2016 1146   NA 135 01/01/2016 1503   NA 143 07/20/2015 1406   K 4.0 05/16/2016 1146   K 4.1 01/01/2016 1503   K 4.2 07/20/2015 1406   CL 103 05/16/2016 1146   CL 101 01/01/2016 1503   CL 105 01/20/2015 1240   CO2 26 05/16/2016 1146   CO2 25 01/01/2016 1503   CO2 28 07/20/2015 1406   BUN 11 05/16/2016 1146   BUN 8 01/01/2016 1503   BUN 10.0 07/20/2015 1406   CREATININE 0.79 05/16/2016 1146   CREATININE 0.62 01/01/2016 1503   CREATININE 0.8 07/20/2015 1406      Component Value Date/Time   CALCIUM 9.4 05/16/2016 1146   CALCIUM 9.1 01/01/2016 1503   CALCIUM 9.2 07/20/2015 1406   ALKPHOS 36 (L) 05/16/2016 1146   ALKPHOS 59 01/01/2016 1503   ALKPHOS 46 07/20/2015 1406   AST 49 (H) 05/16/2016 1146   AST 48 (H) 01/01/2016 1503   AST 70 (H) 07/20/2015 1406   ALT 42 (H) 05/16/2016 1146   ALT 46 (H) 01/01/2016 1503   ALT 63 (H) 07/20/2015 1406   BILITOT 0.4 05/16/2016 1146   BILITOT 0.2 01/01/2016 1503   BILITOT 0.42 07/20/2015 1406         Impression and Plan: Michelle Long is a 70 year old postmenopausal female. She has stage IIA ductal carcinoma left breast. Her breast cancer is triple positive. She underwent lumpectomy followed by chemotherapy and radiation back in 2007.  We will have her stop the tamoxifen now. I think this would be reasonable for her. I think to 10 years, further tamoxifen would really not provide any additional benefit.  We will plan to get her back in 6 months. I think this would be reasonable time for follow-up.   I'm just  glad that she is doing so well. I told her to keep exercising.    Volanda Napoleon, MD 9/13/201710:56 AM

## 2016-07-04 LAB — VITAMIN D 25 HYDROXY (VIT D DEFICIENCY, FRACTURES): Vitamin D, 25-Hydroxy: 41.1 ng/mL (ref 30.0–100.0)

## 2016-07-12 ENCOUNTER — Other Ambulatory Visit: Payer: Self-pay | Admitting: Family Medicine

## 2016-07-12 DIAGNOSIS — I10 Essential (primary) hypertension: Secondary | ICD-10-CM

## 2016-07-15 DIAGNOSIS — H524 Presbyopia: Secondary | ICD-10-CM | POA: Diagnosis not present

## 2016-07-15 LAB — HM DIABETES EYE EXAM

## 2016-08-08 ENCOUNTER — Encounter: Payer: Self-pay | Admitting: Family Medicine

## 2016-08-20 ENCOUNTER — Other Ambulatory Visit: Payer: Self-pay | Admitting: Family Medicine

## 2016-08-20 DIAGNOSIS — J4520 Mild intermittent asthma, uncomplicated: Secondary | ICD-10-CM

## 2016-08-23 ENCOUNTER — Other Ambulatory Visit: Payer: Self-pay | Admitting: Family Medicine

## 2016-08-23 NOTE — Telephone Encounter (Signed)
[name] name: Relationship to patient: Self Can be reached: [phone]  Pharmacy:  [facility], [place] [phone] (Phone) [phone] (Fax)    Reason for call: refill SYMBICORT 160-4.5 MCG/ACT inhaler [id number]

## 2016-08-24 ENCOUNTER — Other Ambulatory Visit: Payer: Self-pay | Admitting: Family Medicine

## 2016-08-26 NOTE — Telephone Encounter (Signed)
Medication was filled to pharmacy as requested by PCP today.

## 2016-08-28 DIAGNOSIS — C678 Malignant neoplasm of overlapping sites of bladder: Secondary | ICD-10-CM | POA: Diagnosis not present

## 2016-09-02 ENCOUNTER — Ambulatory Visit (INDEPENDENT_AMBULATORY_CARE_PROVIDER_SITE_OTHER): Payer: Commercial Managed Care - HMO | Admitting: Family Medicine

## 2016-09-02 ENCOUNTER — Encounter: Payer: Self-pay | Admitting: Family Medicine

## 2016-09-02 VITALS — BP 118/60 | HR 95 | Temp 98.1°F | Resp 16 | Ht 64.0 in | Wt 156.4 lb

## 2016-09-02 DIAGNOSIS — IMO0002 Reserved for concepts with insufficient information to code with codable children: Secondary | ICD-10-CM

## 2016-09-02 DIAGNOSIS — I10 Essential (primary) hypertension: Secondary | ICD-10-CM | POA: Diagnosis not present

## 2016-09-02 DIAGNOSIS — E785 Hyperlipidemia, unspecified: Secondary | ICD-10-CM | POA: Diagnosis not present

## 2016-09-02 DIAGNOSIS — E1165 Type 2 diabetes mellitus with hyperglycemia: Secondary | ICD-10-CM | POA: Diagnosis not present

## 2016-09-02 DIAGNOSIS — E1151 Type 2 diabetes mellitus with diabetic peripheral angiopathy without gangrene: Secondary | ICD-10-CM

## 2016-09-02 DIAGNOSIS — R748 Abnormal levels of other serum enzymes: Secondary | ICD-10-CM | POA: Diagnosis not present

## 2016-09-02 DIAGNOSIS — F418 Other specified anxiety disorders: Secondary | ICD-10-CM | POA: Diagnosis not present

## 2016-09-02 DIAGNOSIS — J4520 Mild intermittent asthma, uncomplicated: Secondary | ICD-10-CM

## 2016-09-02 LAB — POCT URINALYSIS DIPSTICK
Bilirubin, UA: NEGATIVE
Blood, UA: NEGATIVE
Glucose, UA: NEGATIVE
Ketones, UA: NEGATIVE
Leukocytes, UA: NEGATIVE
Nitrite, UA: NEGATIVE
Protein, UA: NEGATIVE
Spec Grav, UA: >=1.03
Urobilinogen, UA: 0.2
pH, UA: 6

## 2016-09-02 MED ORDER — METFORMIN HCL 500 MG PO TABS: 500.0000 mg | ORAL_TABLET | Freq: Two times a day (BID) | ORAL | 0 refills | Status: DC

## 2016-09-02 MED ORDER — LOSARTAN POTASSIUM 50 MG PO TABS: 50.0000 mg | ORAL_TABLET | Freq: Every day | ORAL | 1 refills | Status: DC

## 2016-09-02 MED ORDER — LORAZEPAM 0.5 MG PO TABS: 0.5000 mg | ORAL_TABLET | Freq: Two times a day (BID) | ORAL | 0 refills | Status: DC | PRN

## 2016-09-02 MED ORDER — CITALOPRAM HYDROBROMIDE 10 MG PO TABS: 5.0000 mg | ORAL_TABLET | Freq: Every day | ORAL | 1 refills | Status: DC

## 2016-09-02 NOTE — Progress Notes (Signed)
Patient ID: Michelle Long, female    DOB: March 21, 1946  Age: 70 y.o. MRN: [id number]    Subjective:  Subjective  HPI AVANELLE RITCH presents for f/u dm, cholesterol and lipids.   HPI HYPERTENSION   Blood pressure range-not checking   Chest pain- no      Dyspnea- no Lightheadedness- no   Edema- no  Other side effects - no   Medication compliance: good Low salt diet- yes    DIABETES    Blood Sugar ranges-good per pt   Polyuria- no New Visual problems- no  Hypoglycemic symptoms- good  Other side effects-no Medication compliance - good Last eye exam- this year Foot exam- today   HYPERLIPIDEMIA  Medication compliance- good RUQ pain- no  Muscle aches- no Other side effects-no   Review of Systems  Constitutional: Negative for appetite change, diaphoresis, fatigue and unexpected weight change.  Eyes: Negative for pain, redness and visual disturbance.  Respiratory: Negative for cough, chest tightness, shortness of breath and wheezing.   Cardiovascular: Negative for chest pain, palpitations and leg swelling.  Endocrine: Negative for cold intolerance, heat intolerance, polydipsia, polyphagia and polyuria.  Genitourinary: Negative for difficulty urinating, dysuria and frequency.  Neurological: Negative for dizziness, light-headedness, numbness and headaches.    History Past Medical History:  Diagnosis Date  . Asthma   . Bipolar disorder ([place])   . Bladder cancer ([place])   . Breast cancer ([place])   . Cancer of lower-inner quadrant of left female breast ([place]) 12/08/2013  . Depression   . Diabetes mellitus   . GERD (gastroesophageal reflux disease)   . Hepatitis B infection   . History of transfusion of whole blood    Approximately 2009, due to breast cancer/chemo.  Marland Kitchen Hyperlipidemia     She has a past surgical history that includes Tonsillectomy (1951); Appendectomy (1961); Partial hysterectomy (1991); bladder cancer (2006); Breast lumpectomy (2007); and Abdominal hysterectomy.    Her family history includes Arthritis in her mother; Breast cancer in her mother; Cancer in her father; Diabetes in her father; Heart disease in her father; Hypertension in her mother; Throat cancer in her father; Transient ischemic attack in her mother.She reports that she quit smoking about 18 years ago. Her smoking use included Cigarettes. She started smoking about 51 years ago. She has a 49.50 pack-year smoking history. She has never used smokeless tobacco. She reports that she drinks alcohol. She reports that she does not use drugs.  Current Outpatient Prescriptions on File Prior to Visit  Medication Sig Dispense Refill  . albuterol (PROVENTIL HFA) 108 (90 Base) MCG/ACT inhaler Inhale 2 puffs into the lungs every 6 (six) hours as needed for wheezing or shortness of breath. 3 Inhaler 1  . aspirin 81 MG tablet Take 81 mg by mouth daily.    . Blood Glucose Monitoring Suppl (TRUE METRIX AIR GLUCOSE METER) DEVI     . Calcium Carb-Cholecalciferol (CALCIUM + D3 PO) Take by mouth.    . Cholecalciferol (VITAMIN D) 2000 units CAPS Take 1 capsule by mouth daily.    . fish oil-omega-3 fatty acids 1000 MG capsule Take 1 g by mouth daily.      Marland Kitchen glucose blood (TRUE METRIX BLOOD GLUCOSE TEST) test strip Check your blood sugar once daily. Dx code:E11.9 100 each 3  . OLANZapine (ZYPREXA) 5 MG tablet Take 5 mg by mouth daily.    . pantoprazole (PROTONIX) 40 MG tablet Take 1 tablet (40 mg total) by mouth daily. 90 tablet 0  .  SYMBICORT 160-4.5 MCG/ACT inhaler INHALE 2 PUFFS TWICE DAILY 3 Inhaler 1  . SYMBICORT 160-4.5 MCG/ACT inhaler INHALE TWO PUFFS BY MOUTH TWICE DAILY 1 Inhaler 3  . tamoxifen (NOLVADEX) 20 MG tablet Take 1 tablet (20 mg total) by mouth daily. (Patient not taking: Reported on 09/02/2016) 30 tablet 0   No current facility-administered medications on file prior to visit.      Objective:  Objective  Physical Exam  Constitutional: She is oriented to person, place, and time. She appears  well-developed and well-nourished.  HENT:  Head: Normocephalic and atraumatic.  Eyes: Conjunctivae and EOM are normal.  Neck: Normal range of motion. Neck supple. No JVD present. Carotid bruit is not present. No thyromegaly present.  Cardiovascular: Normal rate, regular rhythm and normal heart sounds.   No murmur heard. Pulmonary/Chest: Effort normal and breath sounds normal. No respiratory distress. She has no wheezes. She has no rales. She exhibits no tenderness.  Musculoskeletal: She exhibits no edema.  Neurological: She is alert and oriented to person, place, and time.  Psychiatric: She has a normal mood and affect. Her behavior is normal. Judgment and thought content normal.  Sensory exam of the foot is normal, tested with the monofilament. Good pulses, no lesions or ulcers, good peripheral pulses.  BP 118/60 (BP Location: Left Arm, Patient Position: Sitting, Cuff Size: Normal)   Pulse 95   Temp 98.1 F (36.7 C) (Oral)   Resp 16   Ht 5\' 4"  (1.626 m)   Wt 156 lb 6.4 oz (70.9 kg)   SpO2 94%   BMI 26.85 kg/m  Wt Readings from Last 3 Encounters:  09/02/16 156 lb 6.4 oz (70.9 kg)  07/03/16 152 lb 12.8 oz (69.3 kg)  05/16/16 152 lb 3.2 oz (69 kg)     Lab Results  Component Value Date   WBC 5.4 07/03/2016   HGB 13.7 07/03/2016   HCT 41.4 07/03/2016   PLT 143 (L) 07/03/2016   GLUCOSE 126 07/03/2016   CHOL 201 (H) 05/16/2016   TRIG 191.0 (H) 05/16/2016   HDL 42.60 05/16/2016   LDLDIRECT 145.6 06/22/2013   LDLCALC 121 (H) 05/16/2016   ALT 54 07/03/2016   AST 54 (H) 07/03/2016   NA 142 07/03/2016   K 4.1 07/03/2016   CL 103 05/16/2016   CREATININE 0.8 07/03/2016   BUN 13.3 07/03/2016   CO2 25 07/03/2016   TSH 0.80 01/30/2015   INR 0.97 09/25/2011   HGBA1C 6.6 (H) 05/16/2016   MICROALBUR 1.4 05/16/2016    Mm Screening Breast Tomo Bilateral  Result Date: 06/17/2016 CLINICAL DATA:  Screening. History of left breast cancer in 2007 status post lumpectomy and radiation  therapy. EXAM: 2D DIGITAL SCREENING BILATERAL MAMMOGRAM WITH CAD AND ADJUNCT TOMO COMPARISON:  Previous exam(s). ACR Breast Density Category c: The breast tissue is heterogeneously dense, which may obscure small masses. FINDINGS: There are stable postsurgical changes within the left breast. There are no findings suspicious for malignancy within either breast. Images were processed with CAD. IMPRESSION: No mammographic evidence of malignancy. A result letter of this screening mammogram will be mailed directly to the patient. RECOMMENDATION: Screening mammogram in one year. (Code:SM-B-01Y) BI-RADS CATEGORY  2: Benign. Electronically Signed   By: Franki Cabot M.D.   On: 06/17/2016 13:03     Assessment & Plan:  Plan  I have changed Ms. Zamarron's losartan. I am also having her maintain her fish oil-omega-3 fatty acids, aspirin, OLANZapine, TRUE METRIX AIR GLUCOSE METER, Calcium Carb-Cholecalciferol (CALCIUM + D3  PO), Vitamin D, glucose blood, tamoxifen, pantoprazole, albuterol, SYMBICORT, SYMBICORT, b complex vitamins, metFORMIN, LORazepam, and citalopram.  Meds ordered this encounter  Medications  . b complex vitamins tablet    Sig: Take 1 tablet by mouth daily.  . metFORMIN (GLUCOPHAGE) 500 MG tablet    Sig: Take 1 tablet (500 mg total) by mouth 2 (two) times daily with a meal. Repeat labs are due now    Dispense:  180 tablet    Refill:  0  . losartan (COZAAR) 50 MG tablet    Sig: Take 1 tablet (50 mg total) by mouth daily.    Dispense:  90 tablet    Refill:  1  . LORazepam (ATIVAN) 0.5 MG tablet    Sig: Take 1 tablet (0.5 mg total) by mouth 2 (two) times daily as needed.    Dispense:  90 tablet    Refill:  0  . citalopram (CELEXA) 10 MG tablet    Sig: Take 0.5 tablets (5 mg total) by mouth at bedtime. Taking 5 mg. Only.    Dispense:  90 tablet    Refill:  1    Problem List Items Addressed This Visit      Unprioritized   HTN (hypertension)   Relevant Medications   losartan (COZAAR) 50  MG tablet   Other Relevant Orders   Comprehensive metabolic panel   Lipid panel   CBC with Differential/Platelet   POCT urinalysis dipstick (Completed)   Hemoglobin A1c    Other Visit Diagnoses    Hyperlipidemia LDL goal <70    -  Primary   Relevant Medications   losartan (COZAAR) 50 MG tablet   Other Relevant Orders   Comprehensive metabolic panel   Lipid panel   DM (diabetes mellitus) type II uncontrolled, periph vascular disorder (HCC)       Relevant Medications   metFORMIN (GLUCOPHAGE) 500 MG tablet   losartan (COZAAR) 50 MG tablet   Other Relevant Orders   Comprehensive metabolic panel   POCT urinalysis dipstick (Completed)   Hemoglobin A1c   Other specified anxiety disorders       Relevant Medications   LORazepam (ATIVAN) 0.5 MG tablet   Asthma, chronic, mild intermittent, uncomplicated       Relevant Medications   citalopram (CELEXA) 10 MG tablet      Follow-up: Return in about 6 months (around 03/02/2017) for annual exam, fasting.  Ann Held, DO

## 2016-09-02 NOTE — Progress Notes (Signed)
Pre visit review using our clinic review tool, if applicable. No additional management support is needed unless otherwise documented below in the visit note. 

## 2016-09-02 NOTE — Patient Instructions (Signed)

## 2016-09-03 ENCOUNTER — Encounter: Payer: Self-pay | Admitting: Podiatry

## 2016-09-03 ENCOUNTER — Ambulatory Visit (INDEPENDENT_AMBULATORY_CARE_PROVIDER_SITE_OTHER): Payer: Commercial Managed Care - HMO | Admitting: Podiatry

## 2016-09-03 DIAGNOSIS — Q828 Other specified congenital malformations of skin: Secondary | ICD-10-CM

## 2016-09-03 DIAGNOSIS — B351 Tinea unguium: Secondary | ICD-10-CM | POA: Diagnosis not present

## 2016-09-03 DIAGNOSIS — M79676 Pain in unspecified toe(s): Secondary | ICD-10-CM

## 2016-09-03 LAB — LIPID PANEL
Cholesterol: 212 mg/dL — ABNORMAL HIGH (ref 0–200)
HDL: 42.8 mg/dL (ref >39.00)
LDL Cholesterol: 133 mg/dL — ABNORMAL HIGH (ref 0–99)
NonHDL: 169.41
Total CHOL/HDL Ratio: 5
Triglycerides: 181 mg/dL — ABNORMAL HIGH (ref 0.0–149.0)
VLDL: 36.2 mg/dL (ref 0.0–40.0)

## 2016-09-03 LAB — CBC WITH DIFFERENTIAL/PLATELET
Basophils Absolute: 0 10*3/uL (ref 0.0–0.1)
Basophils Relative: 0.3 % (ref 0.0–3.0)
Eosinophils Absolute: 0.1 10*3/uL (ref 0.0–0.7)
Eosinophils Relative: 1.6 % (ref 0.0–5.0)
HCT: 41.9 % (ref 36.0–46.0)
Hemoglobin: 13.8 g/dL (ref 12.0–15.0)
Lymphocytes Relative: 32.4 % (ref 12.0–46.0)
Lymphs Abs: 1.9 10*3/uL (ref 0.7–4.0)
MCHC: 33 g/dL (ref 30.0–36.0)
MCV: 93.4 fl (ref 78.0–100.0)
Monocytes Absolute: 0.4 10*3/uL (ref 0.1–1.0)
Monocytes Relative: 7.2 % (ref 3.0–12.0)
Neutro Abs: 3.4 10*3/uL (ref 1.4–7.7)
Neutrophils Relative %: 58.5 % (ref 43.0–77.0)
Platelets: 148 10*3/uL — ABNORMAL LOW (ref 150.0–400.0)
RBC: 4.49 Mil/uL (ref 3.87–5.11)
RDW: 13.7 % (ref 11.5–15.5)
WBC: 5.9 10*3/uL (ref 4.0–10.5)

## 2016-09-03 LAB — COMPREHENSIVE METABOLIC PANEL
ALT: 53 U/L — ABNORMAL HIGH (ref 0–35)
AST: 54 U/L — ABNORMAL HIGH (ref 0–37)
Albumin: 4.2 g/dL (ref 3.5–5.2)
Alkaline Phosphatase: 40 U/L (ref 39–117)
BUN: 9 mg/dL (ref 6–23)
CO2: 26 mEq/L (ref 19–32)
Calcium: 9 mg/dL (ref 8.4–10.5)
Chloride: 103 mEq/L (ref 96–112)
Creatinine, Ser: 0.67 mg/dL (ref 0.40–1.20)
GFR: 92.37 mL/min (ref >60.00)
Glucose, Bld: 103 mg/dL — ABNORMAL HIGH (ref 70–99)
Potassium: 4.4 mEq/L (ref 3.5–5.1)
Sodium: 137 mEq/L (ref 135–145)
Total Bilirubin: 0.3 mg/dL (ref 0.2–1.2)
Total Protein: 6.7 g/dL (ref 6.0–8.3)

## 2016-09-03 LAB — HEMOGLOBIN A1C: Hgb A1c MFr Bld: 6.4 % (ref 4.6–6.5)

## 2016-09-05 ENCOUNTER — Encounter: Payer: Self-pay | Admitting: Family Medicine

## 2016-09-05 NOTE — Progress Notes (Signed)
Patient ID: Michelle Long, female   DOB: October 04, 1946, 70 y.o.   MRN: [id number]  Subjective: 70 year old female presents the office today for diabetic risk assessment and for painful calluses and toenails to both of her feet. She is concerned with the darkening and thickening of both of her big toenails and would like to discuss treatment options for fungus. Denies fevers or drainage in the callus and toenail sites. No recent injury or trauma. No other complaints. No acute changes. Denies any systemic complaints such as fevers, chills, nausea, vomiting. No acute changes since last appointment.  Objective: AAO x3, NAD DP/PT pulses palpable bilaterally, CRT less than 3 seconds Protective sensation intact with [name] monofilament Hyperkeratotic lesions present bilateral submetatarsal one and 5 and right medial hallux. Upon debridement no underlying ulceration, drainage or other signs of infection. Nails are hypertrophic, dystrophic, brittle, discolored, elongated 10. Particular bilateral hallux nails and a significant. No areas of pinpoint bony tenderness or pain with vibratory sensation. MMT 5/5, ROM WNL. No edema, erythema, increase in warmth to bilateral lower extremities.  No other open lesions or pre-ulcerative lesions.  No pain with calf compression, swelling, warmth, erythema  Assessment: Diabetic risk assessment; symptomatic hyperkeratotic lesions, symptomatic onychomycosis  Plan: -All treatment options discussed with the patient including all alternatives, risks, complications.   -Discussed daily foot inspection. -Calluses debrided 5 without complications or bleeding. -Nails debrided 10 without complications or bleeding. Discussed treatment options for onychomycosis and she wishes to proceed with treatment. Ordered compound ointment from [employer].  -Follow-up in 3 months. -Patient encouraged to call the office with any questions, concerns, change in symptoms.   Celesta Gentile, DPM

## 2016-09-09 ENCOUNTER — Other Ambulatory Visit: Payer: Self-pay | Admitting: Family Medicine

## 2016-09-09 DIAGNOSIS — E1151 Type 2 diabetes mellitus with diabetic peripheral angiopathy without gangrene: Secondary | ICD-10-CM

## 2016-09-09 DIAGNOSIS — IMO0002 Reserved for concepts with insufficient information to code with codable children: Secondary | ICD-10-CM

## 2016-09-09 DIAGNOSIS — E1165 Type 2 diabetes mellitus with hyperglycemia: Secondary | ICD-10-CM

## 2016-09-17 ENCOUNTER — Other Ambulatory Visit: Payer: Self-pay | Admitting: Family Medicine

## 2016-09-17 ENCOUNTER — Encounter: Payer: Self-pay | Admitting: Family Medicine

## 2016-09-17 DIAGNOSIS — J4520 Mild intermittent asthma, uncomplicated: Secondary | ICD-10-CM

## 2016-09-17 MED ORDER — FLUTICASONE-SALMETEROL 100-50 MCG/DOSE IN AEPB: 1.0000 | INHALATION_SPRAY | Freq: Two times a day (BID) | RESPIRATORY_TRACT | 3 refills | Status: DC

## 2016-09-21 ENCOUNTER — Other Ambulatory Visit: Payer: Self-pay | Admitting: Family Medicine

## 2016-09-21 ENCOUNTER — Encounter: Payer: Self-pay | Admitting: Family Medicine

## 2016-09-21 DIAGNOSIS — J4520 Mild intermittent asthma, uncomplicated: Secondary | ICD-10-CM

## 2016-09-21 DIAGNOSIS — K219 Gastro-esophageal reflux disease without esophagitis: Secondary | ICD-10-CM

## 2016-10-08 MED ORDER — FLUTICASONE-SALMETEROL 100-50 MCG/DOSE IN AEPB: 1.0000 | INHALATION_SPRAY | Freq: Two times a day (BID) | RESPIRATORY_TRACT | 1 refills | Status: DC

## 2016-11-18 ENCOUNTER — Ambulatory Visit: Payer: Commercial Managed Care - HMO | Admitting: Family Medicine

## 2016-12-02 ENCOUNTER — Telehealth: Payer: Self-pay | Admitting: Family Medicine

## 2016-12-02 DIAGNOSIS — E785 Hyperlipidemia, unspecified: Secondary | ICD-10-CM

## 2016-12-02 DIAGNOSIS — I159 Secondary hypertension, unspecified: Secondary | ICD-10-CM

## 2016-12-02 DIAGNOSIS — R739 Hyperglycemia, unspecified: Secondary | ICD-10-CM

## 2016-12-02 NOTE — Telephone Encounter (Signed)
Relation to [id number] Call back number:[phone]   Reason for call:  Patient last seen 09/02/2016 and states PCP advised to return in 3 months for labs only, chart doesn't reflect orders. Please advise

## 2016-12-03 ENCOUNTER — Ambulatory Visit (INDEPENDENT_AMBULATORY_CARE_PROVIDER_SITE_OTHER): Payer: Medicare HMO | Admitting: Podiatry

## 2016-12-03 ENCOUNTER — Encounter: Payer: Self-pay | Admitting: Podiatry

## 2016-12-03 DIAGNOSIS — Q828 Other specified congenital malformations of skin: Secondary | ICD-10-CM

## 2016-12-03 DIAGNOSIS — B351 Tinea unguium: Secondary | ICD-10-CM | POA: Diagnosis not present

## 2016-12-03 DIAGNOSIS — M79676 Pain in unspecified toe(s): Secondary | ICD-10-CM

## 2016-12-03 DIAGNOSIS — E119 Type 2 diabetes mellitus without complications: Secondary | ICD-10-CM

## 2016-12-03 NOTE — Telephone Encounter (Signed)
Let me know what to enter if this message is correct and then can arrange with the patient.

## 2016-12-03 NOTE — Progress Notes (Signed)
Patient ID: Michelle Long, female   DOB: 1946/05/21, 71 y.o.   MRN: [id number]  Subjective: 71 year old female presents the office today for diabetic risk assessment and for painful calluses and toenails to both of her feet. She states they both get painful as they grow. Denies any acute changes. Denies any systemic complaints such as fevers, chills, nausea, vomiting. No acute changes since last appointment.  Objective: AAO x3, NAD DP/PT pulses palpable bilaterally, CRT less than 3 seconds Protective sensation intact with [name] monofilament Hyperkeratotic lesions present bilateral submetatarsal one and 5 and right medial hallux as well as the 4th interspace on the right. Upon debridement no underlying ulceration, drainage or other signs of infection. Nails are hypertrophic, dystrophic, brittle, discolored, elongated 10. Particular bilateral hallux nails and a significant. No areas of pinpoint bony tenderness or pain with vibratory sensation. MMT 5/5, ROM WNL. No edema, erythema, increase in warmth to bilateral lower extremities.  No other open lesions or pre-ulcerative lesions.  No pain with calf compression, swelling, warmth, erythema  Assessment: Symptomatic hyperkeratotic lesions, symptomatic onychomycosis  Plan: -All treatment options discussed with the patient including all alternatives, risks, complications.   -Discussed daily foot inspection. -Calluses debrided 6 without complications or bleeding. -Nails debrided 10 without complications or bleeding. Discussed treatment options for onychomycosis and she wishes to proceed with treatment. Ordered compound ointment from [employer].  -Follow-up in 3 months. -Patient encouraged to call the office with any questions, concerns, change in symptoms.   Celesta Gentile, DPM

## 2016-12-03 NOTE — Telephone Encounter (Signed)
Hgba1c, cmp, lipid

## 2016-12-04 ENCOUNTER — Other Ambulatory Visit (INDEPENDENT_AMBULATORY_CARE_PROVIDER_SITE_OTHER): Payer: Medicare HMO

## 2016-12-04 DIAGNOSIS — E785 Hyperlipidemia, unspecified: Secondary | ICD-10-CM

## 2016-12-04 DIAGNOSIS — R739 Hyperglycemia, unspecified: Secondary | ICD-10-CM

## 2016-12-04 DIAGNOSIS — I159 Secondary hypertension, unspecified: Secondary | ICD-10-CM | POA: Diagnosis not present

## 2016-12-04 LAB — LIPID PANEL
Cholesterol: 216 mg/dL — ABNORMAL HIGH (ref 0–200)
HDL: 45.9 mg/dL (ref >39.00)
LDL Cholesterol: 147 mg/dL — ABNORMAL HIGH (ref 0–99)
NonHDL: 170.21
Total CHOL/HDL Ratio: 5
Triglycerides: 114 mg/dL (ref 0.0–149.0)
VLDL: 22.8 mg/dL (ref 0.0–40.0)

## 2016-12-04 LAB — COMPREHENSIVE METABOLIC PANEL
ALT: 62 U/L — ABNORMAL HIGH (ref 0–35)
AST: 56 U/L — ABNORMAL HIGH (ref 0–37)
Albumin: 4.2 g/dL (ref 3.5–5.2)
Alkaline Phosphatase: 42 U/L (ref 39–117)
BUN: 12 mg/dL (ref 6–23)
CO2: 31 mEq/L (ref 19–32)
Calcium: 9.2 mg/dL (ref 8.4–10.5)
Chloride: 105 mEq/L (ref 96–112)
Creatinine, Ser: 0.76 mg/dL (ref 0.40–1.20)
GFR: 79.81 mL/min (ref >60.00)
Glucose, Bld: 130 mg/dL — ABNORMAL HIGH (ref 70–99)
Potassium: 3.8 mEq/L (ref 3.5–5.1)
Sodium: 141 mEq/L (ref 135–145)
Total Bilirubin: 0.4 mg/dL (ref 0.2–1.2)
Total Protein: 6.8 g/dL (ref 6.0–8.3)

## 2016-12-04 LAB — HEMOGLOBIN A1C: Hgb A1c MFr Bld: 6.8 % — ABNORMAL HIGH (ref 4.6–6.5)

## 2016-12-04 NOTE — Telephone Encounter (Signed)
Spoke with pt, pt state she can come in today at 10:30 to have lab work done. Labs are entered. Pt had no questions or concerns at this time. LB

## 2016-12-09 ENCOUNTER — Telehealth: Payer: Self-pay | Admitting: Family Medicine

## 2016-12-09 ENCOUNTER — Other Ambulatory Visit: Payer: Self-pay | Admitting: Family Medicine

## 2016-12-09 MED ORDER — EZETIMIBE 10 MG PO TABS: 10.0000 mg | ORAL_TABLET | Freq: Every day | ORAL | 1 refills | Status: DC

## 2016-12-09 MED ORDER — METFORMIN HCL 1000 MG PO TABS: 1000.0000 mg | ORAL_TABLET | Freq: Two times a day (BID) | ORAL | 1 refills | Status: DC

## 2016-12-09 NOTE — Telephone Encounter (Signed)
Patient states left ear feels like she has pushed wax down in her ear.  Ear feels numb?? This has been going on for several months.  No pain at all>  Advise

## 2016-12-09 NOTE — Telephone Encounter (Signed)
Patient informed of PCP instructions and did schedule her in the morning at 11 with PCP on 12/10/16.

## 2016-12-09 NOTE — Telephone Encounter (Signed)
Needs ov

## 2016-12-10 ENCOUNTER — Ambulatory Visit (INDEPENDENT_AMBULATORY_CARE_PROVIDER_SITE_OTHER): Payer: Medicare HMO | Admitting: Family Medicine

## 2016-12-10 ENCOUNTER — Encounter: Payer: Self-pay | Admitting: Family Medicine

## 2016-12-10 VITALS — BP 138/80 | HR 101 | Temp 98.2°F | Resp 16 | Ht 64.0 in | Wt 156.2 lb

## 2016-12-10 DIAGNOSIS — H6122 Impacted cerumen, left ear: Secondary | ICD-10-CM | POA: Insufficient documentation

## 2016-12-10 DIAGNOSIS — H6692 Otitis media, unspecified, left ear: Secondary | ICD-10-CM | POA: Diagnosis not present

## 2016-12-10 MED ORDER — AMOXICILLIN-POT CLAVULANATE 875-125 MG PO TABS
1.0000 | ORAL_TABLET | Freq: Two times a day (BID) | ORAL | 0 refills | Status: DC
Start: 2016-12-10 — End: 2017-03-03

## 2016-12-10 NOTE — Patient Instructions (Signed)
Otitis Media, Adult Otitis media is redness, soreness, and puffiness (swelling) in the space just behind your eardrum (middle ear). It may be caused by allergies or infection. It often happens along with a cold. Follow these instructions at home:  Take your medicine as told. Finish it even if you start to feel better.  Only take over-the-counter or prescription medicines for pain, discomfort, or fever as told by your doctor.  Follow up with your doctor as told. Contact a doctor if:  You have otitis media only in one ear, or bleeding from your nose, or both.  You notice a lump on your neck.  You are not getting better in 3-5 days.  You feel worse instead of better. Get help right away if:  You have pain that is not helped with medicine.  You have puffiness, redness, or pain around your ear.  You get a stiff neck.  You cannot move part of your face (paralysis).  You notice that the bone behind your ear hurts when you touch it. This information is not intended to replace advice given to you by your health care provider. Make sure you discuss any questions you have with your health care provider. Document Released: 03/25/2008 Document Revised: 03/14/2016 Document Reviewed: 05/04/2013 Elsevier Interactive Patient Education  2017 Elsevier Inc.  

## 2016-12-10 NOTE — Progress Notes (Signed)
Subjective:  I acted as a Education administrator for Dr. Carollee Herter.  [name], [place]   Patient ID: Michelle Long, female    DOB: 1946-01-27, 71 y.o.   MRN: [id number]  Chief Complaint  Patient presents with  . left ear feels clogged    also feels numbness thinks it may be wax for 2 months    HPI Patient is in today for left ear feeling clogged and full for the past 2 months.  No other symptoms  Past Medical History:  Diagnosis Date  . Asthma   . Bipolar disorder ([place])   . Bladder cancer ([place])   . Breast cancer ([place])   . Cancer of lower-inner quadrant of left female breast ([facility]) 12/08/2013  . Depression   . Diabetes mellitus   . GERD (gastroesophageal reflux disease)   . Hepatitis B infection   . History of transfusion of whole blood    Approximately 2009, due to breast cancer/chemo.  Marland Kitchen Hyperlipidemia     Past Surgical History:  Procedure Laterality Date  . ABDOMINAL HYSTERECTOMY    . APPENDECTOMY  1961  . bladder cancer  2006  . BREAST LUMPECTOMY  2007  . PARTIAL HYSTERECTOMY  1991  . TONSILLECTOMY  1951    Family History  Problem Relation Age of Onset  . Arthritis Mother   . Breast cancer Mother   . Transient ischemic attack Mother   . Hypertension Mother   . Heart disease Father   . Diabetes Father   . Cancer Father     bladder cancer  . Throat cancer Father     Social History   Social History  . Marital status: Single    Spouse name: N/A  . Number of children: N/A  . Years of education: N/A   Occupational History  .      retired   Social History Main Topics  . Smoking status: Former Smoker    Packs/day: 1.50    Years: 33.00    Types: Cigarettes    Start date: 12/08/1964    Quit date: 08/21/1998  . Smokeless tobacco: Never Used     Comment: quit 16 years ago  . Alcohol use 0.0 oz/week     Comment: very rarely  . Drug use: No  . Sexual activity: Not Currently    Partners: Male   Other Topics Concern  . Not on file   Social History Narrative   Exercise--- walks with neighbor    Outpatient Medications Prior to Visit  Medication Sig Dispense Refill  . albuterol (PROVENTIL HFA) 108 (90 Base) MCG/ACT inhaler Inhale 2 puffs into the lungs every 6 (six) hours as needed for wheezing or shortness of breath. 3 Inhaler 1  . aspirin 81 MG tablet Take 81 mg by mouth daily.    Marland Kitchen b complex vitamins tablet Take 1 tablet by mouth daily.    . Blood Glucose Monitoring Suppl (TRUE METRIX AIR GLUCOSE METER) DEVI     . Calcium Carb-Cholecalciferol (CALCIUM + D3 PO) Take by mouth.    . Cholecalciferol (VITAMIN D) 2000 units CAPS Take 1 capsule by mouth daily.    . citalopram (CELEXA) 10 MG tablet Take 0.5 tablets (5 mg total) by mouth at bedtime. Taking 5 mg. Only. 90 tablet 1  . ezetimibe (ZETIA) 10 MG tablet Take 1 tablet (10 mg total) by mouth daily. 90 tablet 1  . fish oil-omega-3 fatty acids 1000 MG capsule Take 1 g by mouth daily.      Marland Kitchen  glucose blood (TRUE METRIX BLOOD GLUCOSE TEST) test strip Check your blood sugar once daily. Dx code:E11.9 100 each 3  . LORazepam (ATIVAN) 0.5 MG tablet Take 1 tablet (0.5 mg total) by mouth 2 (two) times daily as needed. 90 tablet 0  . losartan (COZAAR) 50 MG tablet Take 1 tablet (50 mg total) by mouth daily. 90 tablet 1  . metFORMIN (GLUCOPHAGE) 1000 MG tablet Take 1 tablet (1,000 mg total) by mouth 2 (two) times daily with a meal. 180 tablet 1  . OLANZapine (ZYPREXA) 5 MG tablet Take 5 mg by mouth daily.    . pantoprazole (PROTONIX) 40 MG tablet TAKE 1 TABLET EVERY DAY 90 tablet 0  . Fluticasone-Salmeterol (ADVAIR) 100-50 MCG/DOSE AEPB Inhale 1 puff into the lungs 2 (two) times daily. 3 each 1  . tamoxifen (NOLVADEX) 20 MG tablet Take 1 tablet (20 mg total) by mouth daily. 30 tablet 0   No facility-administered medications prior to visit.     Allergies  Allergen Reactions  . Lamictal [Lamotrigine]     itch  . Prozac [Fluoxetine Hcl]     seizure  . Statins Other (See Comments)    Muscle pains  . Sulfa  Drugs Cross Reactors     Dyspnea     Review of Systems  Constitutional: Negative for fever and malaise/fatigue.  HENT: Negative for congestion.        Left ear clogged and full   Eyes: Negative for blurred vision.  Respiratory: Negative for cough and shortness of breath.   Cardiovascular: Negative for chest pain, palpitations and leg swelling.  Gastrointestinal: Negative for vomiting.  Musculoskeletal: Negative for back pain.  Skin: Negative for rash.  Neurological: Negative for loss of consciousness and headaches.       Objective:    Physical Exam  Constitutional: She is oriented to person, place, and time. She appears well-developed and well-nourished. No distress.  HENT:  Head: Normocephalic and atraumatic.  Right Ear: Hearing, tympanic membrane, external ear and ear canal normal.  Left Ear: Ear canal normal. There is tenderness. Tympanic membrane is injected, erythematous and bulging. Tympanic membrane mobility is abnormal. Decreased hearing is noted.  Ears:  Eyes: Conjunctivae are normal.  Neck: Normal range of motion. No thyromegaly present.  Cardiovascular: Normal rate and regular rhythm.   Pulmonary/Chest: Effort normal and breath sounds normal. She has no wheezes.  Abdominal: Soft. Bowel sounds are normal. There is no tenderness.  Musculoskeletal: Normal range of motion. She exhibits no edema or deformity.  Neurological: She is alert and oriented to person, place, and time.  Skin: Skin is warm and dry. She is not diaphoretic.  Psychiatric: She has a normal mood and affect.    BP 138/80 (BP Location: Left Arm, Cuff Size: Normal)   Pulse (!) 101   Temp 98.2 F (36.8 C) (Oral)   Resp 16   Ht _0  (1.626 m)   Wt 156 lb 3.2 oz (70.9 kg)   SpO2 95%   BMI 26.81 kg/m  Wt Readings from Last 3 Encounters:  12/10/16 156 lb 3.2 oz (70.9 kg)  09/02/16 156 lb 6.4 oz (70.9 kg)  07/03/16 152 lb 12.8 oz (69.3 kg)     Lab Results  Component Value Date   WBC 5.9  09/02/2016   HGB 13.8 09/02/2016   HCT 41.9 09/02/2016   PLT 148.0 (L) 09/02/2016   GLUCOSE 130 (H) 12/04/2016   CHOL 216 (H) 12/04/2016   TRIG 114.0 12/04/2016   HDL 45.90  12/04/2016   LDLDIRECT 145.6 06/22/2013   LDLCALC 147 (H) 12/04/2016   ALT 62 (H) 12/04/2016   AST 56 (H) 12/04/2016   NA 141 12/04/2016   K 3.8 12/04/2016   CL 105 12/04/2016   CREATININE 0.76 12/04/2016   BUN 12 12/04/2016   CO2 31 12/04/2016   TSH 0.80 01/30/2015   INR 0.97 09/25/2011   HGBA1C 6.8 (H) 12/04/2016   MICROALBUR 1.4 05/16/2016    Lab Results  Component Value Date   TSH 0.80 01/30/2015   Lab Results  Component Value Date   WBC 5.9 09/02/2016   HGB 13.8 09/02/2016   HCT 41.9 09/02/2016   MCV 93.4 09/02/2016   PLT 148.0 (L) 09/02/2016   Lab Results  Component Value Date   NA 141 12/04/2016   K 3.8 12/04/2016   CHLORIDE 107 07/03/2016   CO2 31 12/04/2016   GLUCOSE 130 (H) 12/04/2016   BUN 12 12/04/2016   CREATININE 0.76 12/04/2016   BILITOT 0.4 12/04/2016   ALKPHOS 42 12/04/2016   AST 56 (H) 12/04/2016   ALT 62 (H) 12/04/2016   PROT 6.8 12/04/2016   ALBUMIN 4.2 12/04/2016   CALCIUM 9.2 12/04/2016   ANIONGAP 11 07/03/2016   EGFR 73 (L) 07/03/2016   GFR 79.81 12/04/2016   Lab Results  Component Value Date   CHOL 216 (H) 12/04/2016   Lab Results  Component Value Date   HDL 45.90 12/04/2016   Lab Results  Component Value Date   LDLCALC 147 (H) 12/04/2016   Lab Results  Component Value Date   TRIG 114.0 12/04/2016   Lab Results  Component Value Date   CHOLHDL 5 12/04/2016   Lab Results  Component Value Date   HGBA1C 6.8 (H) 12/04/2016       Assessment & Plan:   Problem List Items Addressed This Visit      Unprioritized   Impacted cerumen of left ear    Irrigated successfully Pt instructed not to use qtips rto prn       Other Visit Diagnoses    Left otitis media, unspecified otitis media type    -  Primary   Relevant Medications    amoxicillin-clavulanate (AUGMENTIN) 875-125 MG tablet                   Ear irrigated successfully                 No complications I have discontinued Ms. Kegley's tamoxifen and Fluticasone-Salmeterol. I am also having her start on amoxicillin-clavulanate. Additionally, I am having her maintain her fish oil-omega-3 fatty acids, aspirin, OLANZapine, TRUE METRIX AIR GLUCOSE METER, Calcium Carb-Cholecalciferol (CALCIUM + D3 PO), Vitamin D, glucose blood, albuterol, b complex vitamins, losartan, LORazepam, citalopram, pantoprazole, ezetimibe, metFORMIN, budesonide-formoterol, and Flaxseed Oil.  Meds ordered this encounter  Medications  . budesonide-formoterol (SYMBICORT) 160-4.5 MCG/ACT inhaler    Sig: Inhale 2 puffs into the lungs 2 (two) times daily.  . Flaxseed, Linseed, (FLAXSEED OIL) 1000 MG CAPS    Sig: Take 1 capsule by mouth 2 (two) times daily.  Marland Kitchen amoxicillin-clavulanate (AUGMENTIN) 875-125 MG tablet    Sig: Take 1 tablet by mouth 2 (two) times daily.    Dispense:  20 tablet    Refill:  0    CMA served as scribe during this visit. History, Physical and Plan performed by medical provider. Documentation and orders reviewed and attested to.  Ann Held, DO

## 2016-12-10 NOTE — Assessment & Plan Note (Signed)
Irrigated successfully Pt instructed not to use qtips rto prn

## 2016-12-10 NOTE — Progress Notes (Signed)
Pre visit review using our clinic review tool, if applicable. No additional management support is needed unless otherwise documented below in the visit note. 

## 2017-01-01 ENCOUNTER — Encounter: Payer: Self-pay | Admitting: Family

## 2017-01-01 ENCOUNTER — Ambulatory Visit (HOSPITAL_BASED_OUTPATIENT_CLINIC_OR_DEPARTMENT_OTHER): Payer: Medicare HMO | Admitting: Family

## 2017-01-01 ENCOUNTER — Other Ambulatory Visit (HOSPITAL_BASED_OUTPATIENT_CLINIC_OR_DEPARTMENT_OTHER): Payer: Medicare HMO

## 2017-01-01 VITALS — BP 116/61 | HR 114 | Temp 98.5°F | Resp 16 | Wt 152.4 lb

## 2017-01-01 DIAGNOSIS — Z79810 Long term (current) use of selective estrogen receptor modulators (SERMs): Secondary | ICD-10-CM

## 2017-01-01 DIAGNOSIS — C50312 Malignant neoplasm of lower-inner quadrant of left female breast: Secondary | ICD-10-CM | POA: Diagnosis not present

## 2017-01-01 DIAGNOSIS — E559 Vitamin D deficiency, unspecified: Secondary | ICD-10-CM | POA: Diagnosis not present

## 2017-01-01 DIAGNOSIS — Z23 Encounter for immunization: Secondary | ICD-10-CM

## 2017-01-01 DIAGNOSIS — Z853 Personal history of malignant neoplasm of breast: Secondary | ICD-10-CM | POA: Diagnosis not present

## 2017-01-01 DIAGNOSIS — Z170 Estrogen receptor positive status [ER+]: Secondary | ICD-10-CM

## 2017-01-01 LAB — CBC WITH DIFFERENTIAL (CANCER CENTER ONLY)
BASO#: 0 10*3/uL (ref 0.0–0.2)
BASO%: 0.1 % (ref 0.0–2.0)
EOS%: 1.9 % (ref 0.0–7.0)
Eosinophils Absolute: 0.1 10*3/uL (ref 0.0–0.5)
HCT: 45.6 % (ref 34.8–46.6)
HGB: 15.2 g/dL (ref 11.6–15.9)
LYMPH#: 1.6 10*3/uL (ref 0.9–3.3)
LYMPH%: 24 % (ref 14.0–48.0)
MCH: 31.3 pg (ref 26.0–34.0)
MCHC: 33.3 g/dL (ref 32.0–36.0)
MCV: 94 fL (ref 81–101)
MONO#: 0.6 10*3/uL (ref 0.1–0.9)
MONO%: 9.5 % (ref 0.0–13.0)
NEUT#: 4.4 10*3/uL (ref 1.5–6.5)
NEUT%: 64.5 % (ref 39.6–80.0)
Platelets: 160 10*3/uL (ref 145–400)
RBC: 4.85 10*6/uL (ref 3.70–5.32)
RDW: 12.9 % (ref 11.1–15.7)
WBC: 6.8 10*3/uL (ref 3.9–10.0)

## 2017-01-01 LAB — COMPREHENSIVE METABOLIC PANEL
ALT: 63 U/L — ABNORMAL HIGH (ref 0–55)
AST: 60 U/L — ABNORMAL HIGH (ref 5–34)
Albumin: 4.1 g/dL (ref 3.5–5.0)
Alkaline Phosphatase: 49 U/L (ref 40–150)
Anion Gap: 13 mEq/L — ABNORMAL HIGH (ref 3–11)
BUN: 10.6 mg/dL (ref 7.0–26.0)
CO2: 26 mEq/L (ref 22–29)
Calcium: 10.1 mg/dL (ref 8.4–10.4)
Chloride: 103 mEq/L (ref 98–109)
Creatinine: 0.8 mg/dL (ref 0.6–1.1)
EGFR: 70 mL/min/{1.73_m2} — ABNORMAL LOW (ref >90)
Glucose: 138 mg/dl (ref 70–140)
Potassium: 4.6 mEq/L (ref 3.5–5.1)
Sodium: 142 mEq/L (ref 136–145)
Total Bilirubin: 0.64 mg/dL (ref 0.20–1.20)
Total Protein: 7.9 g/dL (ref 6.4–8.3)

## 2017-01-01 NOTE — Progress Notes (Signed)
Hematology and Oncology Follow Up Visit  Michelle Long [id number] 1946-04-02 71 y.o. 01/01/2017   Principle Diagnosis:  Stage IIA (T2 N0M0) infiltrating ductal carcinoma of the left breast  Current Therapy:   Tamoxifen 20 mg by mouth daily - finished in September 2017    Interim History:  Michelle Long is here today for a follow-up. She is doing quite well and currently getting over a sinus infection. She has had some drainage and a mild cough with occasional yellow sputum. She is taking over the counter cough syrup which has been helpful.  No fever, chills, n/v, rash, dizziness, chest pain, palpitations, abdominal pain, constipation, diarrhea, blood in urine or stool.  She does have some SOB due to asthma at times of exertion.  Mammogram in August was negative. Breast exam today was negative.  No swelling, tenderness, numbness or tingling in her extremities. No c/o joint aches or pains.   She has maintained a good appetite and is staying well hydrated. Her weight is stable.   Medications:  Allergies as of 01/01/2017      Reactions   Lamictal [lamotrigine]    itch   Prozac [fluoxetine Hcl]    seizure   Statins Other (See Comments)   Muscle pains   Sulfa Drugs Cross Reactors    Dyspnea      Medication List       Accurate as of 01/01/17 11:49 AM. Always use your most recent med list.          albuterol 108 (90 Base) MCG/ACT inhaler Commonly known as:  PROVENTIL HFA Inhale 2 puffs into the lungs every 6 (six) hours as needed for wheezing or shortness of breath.   amoxicillin-clavulanate 875-125 MG tablet Commonly known as:  AUGMENTIN Take 1 tablet by mouth 2 (two) times daily.   aspirin 81 MG tablet Take 81 mg by mouth daily.   b complex vitamins tablet Take 1 tablet by mouth daily.   budesonide-formoterol 160-4.5 MCG/ACT inhaler Commonly known as:  SYMBICORT Inhale 2 puffs into the lungs 2 (two) times daily.   CALCIUM + D3 PO Take by mouth.   citalopram 10 MG  tablet Commonly known as:  CELEXA Take 0.5 tablets (5 mg total) by mouth at bedtime. Taking 5 mg. Only.   ezetimibe 10 MG tablet Commonly known as:  ZETIA Take 1 tablet (10 mg total) by mouth daily.   fish oil-omega-3 fatty acids 1000 MG capsule Take 1 g by mouth daily.   Flaxseed Oil 1000 MG Caps Take 1 capsule by mouth 2 (two) times daily.   glucose blood test strip Commonly known as:  TRUE METRIX BLOOD GLUCOSE TEST Check your blood sugar once daily. Dx code:E11.9   LORazepam 0.5 MG tablet Commonly known as:  ATIVAN Take 1 tablet (0.5 mg total) by mouth 2 (two) times daily as needed.   losartan 50 MG tablet Commonly known as:  COZAAR Take 1 tablet (50 mg total) by mouth daily.   metFORMIN 1000 MG tablet Commonly known as:  GLUCOPHAGE Take 1 tablet (1,000 mg total) by mouth 2 (two) times daily with a meal.   OLANZapine 5 MG tablet Commonly known as:  ZYPREXA Take 5 mg by mouth daily.   pantoprazole 40 MG tablet Commonly known as:  PROTONIX TAKE 1 TABLET EVERY DAY   TRUE METRIX AIR GLUCOSE METER Devi   Vitamin D 2000 units Caps Take 1 capsule by mouth daily.       Allergies:  Allergies  Allergen  Reactions  . Lamictal [Lamotrigine]     itch  . Prozac [Fluoxetine Hcl]     seizure  . Statins Other (See Comments)    Muscle pains  . Sulfa Drugs Cross Reactors     Dyspnea     Past Medical History, Surgical history, Social history, and Family History were reviewed and updated.  Review of Systems: All other 10 point review of systems is negative.   Physical Exam:  weight is 152 lb 6.4 oz (69.1 kg). Her oral temperature is 98.5 F (36.9 C). Her blood pressure is 116/61 and her pulse is 114 (abnormal). Her respiration is 16 and oxygen saturation is 96%.   Wt Readings from Last 3 Encounters:  01/01/17 152 lb 6.4 oz (69.1 kg)  12/10/16 156 lb 3.2 oz (70.9 kg)  09/02/16 156 lb 6.4 oz (70.9 kg)    Ocular: Sclerae unicteric, pupils equal, round and reactive  to light Ear-nose-throat: Oropharynx clear, dentition fair Lymphatic: No cervical, supraclavicular or axillary adenopathy Lungs no rales or rhonchi, good excursion bilaterally Heart regular rate and rhythm, no murmur appreciated Abd soft, nontender, positive bowel sounds, no liver or spleen tip palpated on exam, no fluid wave MSK no focal spinal tenderness, no joint edema Neuro: non-focal, well-oriented, appropriate affect Breasts: Left breast lumpectomy scar at the 6 o'clock position is intact. No changes. No mass, lesion, rash or lymphadenopathy.   Lab Results  Component Value Date   WBC 6.8 01/01/2017   HGB 15.2 01/01/2017   HCT 45.6 01/01/2017   MCV 94 01/01/2017   PLT 160 01/01/2017   No results found for: FERRITIN, IRON, TIBC, UIBC, IRONPCTSAT Lab Results  Component Value Date   RBC 4.85 01/01/2017   No results found for: KPAFRELGTCHN, LAMBDASER, KAPLAMBRATIO No results found for: IGGSERUM, IGA, IGMSERUM No results found for: Odetta Pink, SPEI   Chemistry      Component Value Date/Time   NA 141 12/04/2016 1032   NA 142 07/03/2016 1014   K 3.8 12/04/2016 1032   K 4.1 07/03/2016 1014   CL 105 12/04/2016 1032   CL 101 01/01/2016 1503   CL 105 01/20/2015 1240   CO2 31 12/04/2016 1032   CO2 25 07/03/2016 1014   BUN 12 12/04/2016 1032   BUN 13.3 07/03/2016 1014   CREATININE 0.76 12/04/2016 1032   CREATININE 0.8 07/03/2016 1014      Component Value Date/Time   CALCIUM 9.2 12/04/2016 1032   CALCIUM 9.1 07/03/2016 1014   ALKPHOS 42 12/04/2016 1032   ALKPHOS 45 07/03/2016 1014   AST 56 (H) 12/04/2016 1032   AST 54 (H) 07/03/2016 1014   ALT 62 (H) 12/04/2016 1032   ALT 54 07/03/2016 1014   BILITOT 0.4 12/04/2016 1032   BILITOT 0.37 07/03/2016 1014     Impression and Plan: Michelle Long is a 71 yo postmenopausal female with history of stage IIA ductal carcinoma left breast - triple positive. She completed 10 years of  Tamoxifen in September 2017. So far, she has don well and there has been no evidence of recurrence.  Her LFTs appear stable. She is followed closely by GI and they have pushed her appointments out to once a year.  We will go ahead and plan to see her back in 6 months for repeat lab work and follow-up.  She will contact our office with any questions or concerns. We can certainly see her sooner if need be.   Eliezer Bottom, NP  3/14/201811:49 AM

## 2017-01-02 LAB — VITAMIN D 25 HYDROXY (VIT D DEFICIENCY, FRACTURES): Vitamin D, 25-Hydroxy: 53.5 ng/mL (ref 30.0–100.0)

## 2017-01-07 ENCOUNTER — Telehealth: Payer: Medicare HMO | Admitting: Nurse Practitioner

## 2017-01-07 DIAGNOSIS — N390 Urinary tract infection, site not specified: Secondary | ICD-10-CM

## 2017-01-07 MED ORDER — NITROFURANTOIN MONOHYD MACRO 100 MG PO CAPS: 100.0000 mg | ORAL_CAPSULE | Freq: Two times a day (BID) | ORAL | 0 refills | Status: AC

## 2017-01-07 NOTE — Progress Notes (Signed)

## 2017-01-14 DIAGNOSIS — F250 Schizoaffective disorder, bipolar type: Secondary | ICD-10-CM | POA: Diagnosis not present

## 2017-01-30 ENCOUNTER — Other Ambulatory Visit: Payer: Self-pay | Admitting: Family Medicine

## 2017-01-30 DIAGNOSIS — I10 Essential (primary) hypertension: Secondary | ICD-10-CM

## 2017-01-30 DIAGNOSIS — K219 Gastro-esophageal reflux disease without esophagitis: Secondary | ICD-10-CM

## 2017-02-16 ENCOUNTER — Encounter: Payer: Self-pay | Admitting: Family Medicine

## 2017-02-17 MED ORDER — BUDESONIDE-FORMOTEROL FUMARATE 160-4.5 MCG/ACT IN AERO: 2.0000 | INHALATION_SPRAY | Freq: Two times a day (BID) | RESPIRATORY_TRACT | 5 refills | Status: DC

## 2017-02-18 ENCOUNTER — Other Ambulatory Visit: Payer: Self-pay | Admitting: Family Medicine

## 2017-02-18 MED ORDER — SPACER/AERO CHAMBER MOUTHPIECE MISC: 1 refills | Status: AC

## 2017-03-03 ENCOUNTER — Encounter: Payer: Self-pay | Admitting: Family Medicine

## 2017-03-03 ENCOUNTER — Ambulatory Visit (INDEPENDENT_AMBULATORY_CARE_PROVIDER_SITE_OTHER): Payer: Medicare HMO | Admitting: Family Medicine

## 2017-03-03 VITALS — BP 110/54 | HR 100 | Temp 98.0°F | Ht 64.0 in | Wt 154.2 lb

## 2017-03-03 DIAGNOSIS — E1151 Type 2 diabetes mellitus with diabetic peripheral angiopathy without gangrene: Secondary | ICD-10-CM | POA: Diagnosis not present

## 2017-03-03 DIAGNOSIS — Z0000 Encounter for general adult medical examination without abnormal findings: Secondary | ICD-10-CM | POA: Diagnosis not present

## 2017-03-03 DIAGNOSIS — E785 Hyperlipidemia, unspecified: Secondary | ICD-10-CM | POA: Diagnosis not present

## 2017-03-03 DIAGNOSIS — I10 Essential (primary) hypertension: Secondary | ICD-10-CM | POA: Diagnosis not present

## 2017-03-03 LAB — COMPREHENSIVE METABOLIC PANEL
ALT: 64 U/L — ABNORMAL HIGH (ref 0–35)
AST: 59 U/L — ABNORMAL HIGH (ref 0–37)
Albumin: 4.5 g/dL (ref 3.5–5.2)
Alkaline Phosphatase: 37 U/L — ABNORMAL LOW (ref 39–117)
BUN: 13 mg/dL (ref 6–23)
CO2: 29 mEq/L (ref 19–32)
Calcium: 9.8 mg/dL (ref 8.4–10.5)
Chloride: 102 mEq/L (ref 96–112)
Creatinine, Ser: 0.73 mg/dL (ref 0.40–1.20)
GFR: 83.55 mL/min (ref >60.00)
Glucose, Bld: 109 mg/dL — ABNORMAL HIGH (ref 70–99)
Potassium: 4.4 mEq/L (ref 3.5–5.1)
Sodium: 140 mEq/L (ref 135–145)
Total Bilirubin: 0.5 mg/dL (ref 0.2–1.2)
Total Protein: 7.4 g/dL (ref 6.0–8.3)

## 2017-03-03 LAB — CBC WITH DIFFERENTIAL/PLATELET
Basophils Absolute: 0 10*3/uL (ref 0.0–0.1)
Basophils Relative: 0.6 % (ref 0.0–3.0)
Eosinophils Absolute: 0.2 10*3/uL (ref 0.0–0.7)
Eosinophils Relative: 2.7 % (ref 0.0–5.0)
HCT: 44.1 % (ref 36.0–46.0)
Hemoglobin: 14.6 g/dL (ref 12.0–15.0)
Lymphocytes Relative: 29.1 % (ref 12.0–46.0)
Lymphs Abs: 1.9 10*3/uL (ref 0.7–4.0)
MCHC: 33.2 g/dL (ref 30.0–36.0)
MCV: 93.2 fl (ref 78.0–100.0)
Monocytes Absolute: 0.5 10*3/uL (ref 0.1–1.0)
Monocytes Relative: 7.8 % (ref 3.0–12.0)
Neutro Abs: 3.8 10*3/uL (ref 1.4–7.7)
Neutrophils Relative %: 59.8 % (ref 43.0–77.0)
Platelets: 188 10*3/uL (ref 150.0–400.0)
RBC: 4.73 Mil/uL (ref 3.87–5.11)
RDW: 14.3 % (ref 11.5–15.5)
WBC: 6.4 10*3/uL (ref 4.0–10.5)

## 2017-03-03 LAB — LIPID PANEL
Cholesterol: 221 mg/dL — ABNORMAL HIGH (ref 0–200)
HDL: 45.6 mg/dL (ref >39.00)
LDL Cholesterol: 144 mg/dL — ABNORMAL HIGH (ref 0–99)
NonHDL: 175.49
Total CHOL/HDL Ratio: 5
Triglycerides: 156 mg/dL — ABNORMAL HIGH (ref 0.0–149.0)
VLDL: 31.2 mg/dL (ref 0.0–40.0)

## 2017-03-03 LAB — POC URINALSYSI DIPSTICK (AUTOMATED)
Bilirubin, UA: NEGATIVE
Blood, UA: NEGATIVE
Glucose, UA: NEGATIVE
Ketones, UA: NEGATIVE
Leukocytes, UA: NEGATIVE
Nitrite, UA: NEGATIVE
Protein, UA: NEGATIVE
Spec Grav, UA: 1.025 (ref 1.010–1.025)
Urobilinogen, UA: 0.2 E.U./dL
pH, UA: 6 (ref 5.0–8.0)

## 2017-03-03 LAB — HEMOGLOBIN A1C: Hgb A1c MFr Bld: 6.6 % — ABNORMAL HIGH (ref 4.6–6.5)

## 2017-03-03 NOTE — Progress Notes (Signed)
Pre visit review using our clinic review tool, if applicable. No additional management support is needed unless otherwise documented below in the visit note. 

## 2017-03-03 NOTE — Assessment & Plan Note (Signed)
Encouraged heart healthy diet, increase exercise, avoid trans fats, consider a krill oil cap daily 

## 2017-03-03 NOTE — Addendum Note (Signed)
Addended by: Caffie Pinto on: 03/03/2017 04:44 PM   Modules accepted: Orders

## 2017-03-03 NOTE — Patient Instructions (Addendum)
Ms. Burbage , Thank you for taking time to come for your Medicare Wellness Visit. I appreciate your ongoing commitment to your health goals. Please review the following plan we discussed and let me know if I can assist you in the future.   Bring a copy of your advance directives to your next office visit.  These are the goals we discussed: Goals    . Eat more fruits and vegetables    . Increase water intake       This is a list of the screening recommended for you and due dates:  Health Maintenance  Topic Date Due  . Flu Shot  05/21/2017  . Hemoglobin A1C  06/03/2017  . Eye exam for diabetics  07/15/2017  . Complete foot exam   09/02/2017  . Colon Cancer Screening  01/15/2018  . Mammogram  06/14/2018  . Tetanus Vaccine  09/19/2025  . DEXA scan (bone density measurement)  Completed  .  Hepatitis C: One time screening is recommended by Center for Disease Control  (CDC) for  adults born from [birth year] through 1965.   Completed  . Pneumonia vaccines  Completed    Preventive Care 22 Years and Older, Female Preventive care refers to lifestyle choices and visits with your health care provider that can promote health and wellness. What does preventive care include?  A yearly physical exam. This is also called an annual well check.  Dental exams once or twice a year.  Routine eye exams. Ask your health care provider how often you should have your eyes checked.  Personal lifestyle choices, including:  Daily care of your teeth and gums.  Regular physical activity.  Eating a healthy diet.  Avoiding tobacco and drug use.  Limiting alcohol use.  Practicing safe sex.  Taking low-dose aspirin every day.  Taking vitamin and mineral supplements as recommended by your health care provider. What happens during an annual well check? The services and screenings done by your health care provider during your annual well check will depend on your age, overall health, lifestyle risk factors,  and family history of disease. Counseling  Your health care provider may ask you questions about your:  Alcohol use.  Tobacco use.  Drug use.  Emotional well-being.  Home and relationship well-being.  Sexual activity.  Eating habits.  History of falls.  Memory and ability to understand (cognition).  Work and work Statistician.  Reproductive health. Screening  You may have the following tests or measurements:  Height, weight, and BMI.  Blood pressure.  Lipid and cholesterol levels. These may be checked every 5 years, or more frequently if you are over 85 years old.  Skin check.  Lung cancer screening. You may have this screening every year starting at age 36 if you have a 30-pack-year history of smoking and currently smoke or have quit within the past 15 years.  Fecal occult blood test (FOBT) of the stool. You may have this test every year starting at age 39.  Flexible sigmoidoscopy or colonoscopy. You may have a sigmoidoscopy every 5 years or a colonoscopy every 10 years starting at age 49.  Hepatitis C blood test.  Hepatitis B blood test.  Sexually transmitted disease (STD) testing.  Diabetes screening. This is done by checking your blood sugar (glucose) after you have not eaten for a while (fasting). You may have this done every 1-3 years.  Bone density scan. This is done to screen for osteoporosis. You may have this done starting at age 81.  Mammogram. This may be done every 1-2 years. Talk to your health care provider about how often you should have regular mammograms. Talk with your health care provider about your test results, treatment options, and if necessary, the need for more tests. Vaccines  Your health care provider may recommend certain vaccines, such as:  Influenza vaccine. This is recommended every year.  Tetanus, diphtheria, and acellular pertussis (Tdap, Td) vaccine. You may need a Td booster every 10 years.  Varicella vaccine. You may  need this if you have not been vaccinated.  Zoster vaccine. You may need this after age 82.  Measles, mumps, and rubella (MMR) vaccine. You may need at least one dose of MMR if you were born in 1957 or later. You may also need a second dose.  Pneumococcal 13-valent conjugate (PCV13) vaccine. One dose is recommended after age 22.  Pneumococcal polysaccharide (PPSV23) vaccine. One dose is recommended after age 65.  Meningococcal vaccine. You may need this if you have certain conditions.  Hepatitis A vaccine. You may need this if you have certain conditions or if you travel or work in places where you may be exposed to hepatitis A.  Hepatitis B vaccine. You may need this if you have certain conditions or if you travel or work in places where you may be exposed to hepatitis B.  Haemophilus influenzae type b (Hib) vaccine. You may need this if you have certain conditions. Talk to your health care provider about which screenings and vaccines you need and how often you need them. This information is not intended to replace advice given to you by your health care provider. Make sure you discuss any questions you have with your health care provider. Document Released: 11/03/2015 Document Revised: 06/26/2016 Document Reviewed: 08/08/2015 Elsevier Interactive Patient Education  2017 [employer].

## 2017-03-03 NOTE — Assessment & Plan Note (Signed)
See avs ghm utd  Check labs 

## 2017-03-03 NOTE — Progress Notes (Signed)
Patient ID: RAIVEN BELIZAIRE, female   DOB: 10-31-1945, 71 y.o.   MRN: [id number]   Subjective:  I acted as a Education administrator for [employer], DO. Raiford Noble, [place]   Patient ID: Brent Bulla, female    DOB: [birth year], 71 y.o.   MRN: [id number]  Chief Complaint  Patient presents with  . Annual Exam  . Diabetes  . Hyperlipidemia  . Hypertension    HPI  Patient is in today for annual examination. Patient has a Hx of Type II Diabetes, HTN, hyperlipidemia. Patient has no acute concerns noted at this time.  HPI HYPERTENSION   Blood pressure range-not checking   Chest pain- no      Dyspnea- no Lightheadedness- no   Edema- no  Other side effects - no   Medication compliance: good Low salt diet- yes    DIABETES    Blood Sugar ranges-good per pt  Polyuria- no New Visual problems- no  Hypoglycemic symptoms- no  Other side effects-no Medication compliance - good Last eye exam- utd per pt Foot exam- today   HYPERLIPIDEMIA  Medication compliance- good RUQ pain- no  Muscle aches- no Other side effects-no   Patient Care Team: Ann Held, DO as PCP - General (Family Medicine) Hale Bogus., MD as Referring Physician (Gastroenterology) Franchot Gallo, MD as Consulting Physician (Urology) Ricard Dillon, MD (Psychiatry) Trula Slade, DPM as Consulting Physician (Podiatry) Aretha Parrot as Consulting Physician (Optometry) Clearence Ped, DDS (Dentistry)   Past Medical History:  Diagnosis Date  . Asthma   . Bipolar disorder ([place])   . Bladder cancer ([place])   . Breast cancer ([place])   . Cancer of lower-inner quadrant of left female breast ([place]) 12/08/2013  . Depression   . Diabetes mellitus   . GERD (gastroesophageal reflux disease)   . Hepatitis B infection   . History of transfusion of whole blood    Approximately 2009, due to breast cancer/chemo.  Marland Kitchen Hyperlipidemia     Past Surgical History:  Procedure Laterality Date  . ABDOMINAL HYSTERECTOMY    .  APPENDECTOMY  1961  . bladder cancer  2006  . BREAST LUMPECTOMY  2007  . PARTIAL HYSTERECTOMY  1991  . TONSILLECTOMY  1951    Family History  Problem Relation Age of Onset  . Arthritis Mother   . Breast cancer Mother   . Transient ischemic attack Mother   . Hypertension Mother   . Heart disease Father   . Diabetes Father   . Cancer Father        bladder cancer  . Throat cancer Father     Social History   Social History  . Marital status: Single    Spouse name: N/A  . Number of children: N/A  . Years of education: N/A   Occupational History  .      retired   Social History Main Topics  . Smoking status: Former Smoker    Packs/day: 1.50    Years: 33.00    Types: Cigarettes    Start date: 12/08/1964    Quit date: 08/21/1998  . Smokeless tobacco: Never Used     Comment: quit 16 years ago  . Alcohol use 0.0 oz/week     Comment: very rarely  . Drug use: No  . Sexual activity: Not Currently    Partners: Male   Other Topics Concern  . Not on file   Social History Narrative   Exercise--- walks with neighbor  Outpatient Medications Prior to Visit  Medication Sig Dispense Refill  . albuterol (PROVENTIL HFA) 108 (90 Base) MCG/ACT inhaler Inhale 2 puffs into the lungs every 6 (six) hours as needed for wheezing or shortness of breath. 3 Inhaler 1  . aspirin 81 MG tablet Take 81 mg by mouth daily.    Marland Kitchen b complex vitamins tablet Take 1 tablet by mouth daily.    . Blood Glucose Monitoring Suppl (TRUE METRIX AIR GLUCOSE METER) DEVI     . budesonide-formoterol (SYMBICORT) 160-4.5 MCG/ACT inhaler Inhale 2 puffs into the lungs 2 (two) times daily. 10.2 g 5  . Calcium Carb-Cholecalciferol (CALCIUM + D3 PO) Take by mouth.    . Cholecalciferol (VITAMIN D) 2000 units CAPS Take 1 capsule by mouth daily.    . citalopram (CELEXA) 10 MG tablet Take 0.5 tablets (5 mg total) by mouth at bedtime. Taking 5 mg. Only. 90 tablet 1  . ezetimibe (ZETIA) 10 MG tablet Take 1 tablet (10 mg  total) by mouth daily. 90 tablet 1  . fish oil-omega-3 fatty acids 1000 MG capsule Take 1 g by mouth daily.      Marland Kitchen glucose blood (TRUE METRIX BLOOD GLUCOSE TEST) test strip Check your blood sugar once daily. Dx code:E11.9 100 each 3  . LORazepam (ATIVAN) 0.5 MG tablet Take 1 tablet (0.5 mg total) by mouth 2 (two) times daily as needed. 90 tablet 0  . losartan (COZAAR) 50 MG tablet TAKE 1 TABLET EVERY DAY 90 tablet 1  . metFORMIN (GLUCOPHAGE) 1000 MG tablet Take 1 tablet (1,000 mg total) by mouth 2 (two) times daily with a meal. 180 tablet 1  . OLANZapine (ZYPREXA) 5 MG tablet Take 5 mg by mouth daily.    . pantoprazole (PROTONIX) 40 MG tablet TAKE 1 TABLET EVERY DAY 90 tablet 0  . Spacer/Aero Chamber Mouthpiece MISC To use with inhaler 1 each 1  . amoxicillin-clavulanate (AUGMENTIN) 875-125 MG tablet Take 1 tablet by mouth 2 (two) times daily. (Patient not taking: Reported on 03/03/2017) 20 tablet 0  . Flaxseed, Linseed, (FLAXSEED OIL) 1000 MG CAPS Take 1 capsule by mouth 2 (two) times daily.     No facility-administered medications prior to visit.     Allergies  Allergen Reactions  . Lamictal [Lamotrigine]     itch  . Prozac [Fluoxetine Hcl]     seizure  . Statins Other (See Comments)    Muscle pains  . Sulfa Drugs Cross Reactors     Dyspnea     Review of Systems  Constitutional: Negative for fever and malaise/fatigue.  HENT: Negative for congestion.   Eyes: Negative for blurred vision.  Respiratory: Negative for cough and shortness of breath.   Cardiovascular: Negative for chest pain, palpitations and leg swelling.  Gastrointestinal: Negative for vomiting.  Musculoskeletal: Negative for back pain.  Skin: Negative for rash.  Neurological: Negative for loss of consciousness and headaches.       Objective:    Physical Exam  Constitutional: She is oriented to person, place, and time. She appears well-developed and well-nourished. No distress.  HENT:  Head: Normocephalic and  atraumatic.  Eyes: Conjunctivae are normal.  Neck: Normal range of motion. No thyromegaly present.  Cardiovascular: Normal rate and regular rhythm.   Pulmonary/Chest: Effort normal and breath sounds normal. She has no wheezes.  Abdominal: Soft. Bowel sounds are normal. There is no tenderness.  Musculoskeletal: She exhibits no edema or deformity.  Neurological: She is alert and oriented to person, place, and time.  Skin: Skin is warm and dry. She is not diaphoretic.  Psychiatric: She has a normal mood and affect.   Sensory exam of the foot is normal, tested with the monofilament. Good pulses, no lesions or ulcers, good peripheral pulses. BP (!) 110/54 (BP Location: Left Arm, Patient Position: Sitting, Cuff Size: Large)   Pulse 100   Temp 98 F (36.7 C) (Oral)   Ht _0  (1.626 m)   Wt 154 lb 3.2 oz (69.9 kg)   SpO2 95% Comment: RA  BMI 26.47 kg/m  Wt Readings from Last 3 Encounters:  03/03/17 154 lb 3.2 oz (69.9 kg)  01/01/17 152 lb 6.4 oz (69.1 kg)  12/10/16 156 lb 3.2 oz (70.9 kg)   BP Readings from Last 3 Encounters:  03/03/17 (!) 110/54  01/01/17 116/61  12/10/16 138/80     Immunization History  Administered Date(s) Administered  . Influenza Whole 07/01/2012  . Influenza,inj,Quad PF,36+ Mos 08/12/2013, 07/22/2014, 07/03/2016  . Influenza-Unspecified 09/20/2015  . Pneumococcal Conjugate-13 01/30/2015  . Pneumococcal Polysaccharide-23 10/23/2003, 10/22/2013  . Td 06/22/2013  . Tdap 09/20/2015  . Zoster 09/07/2013    Health Maintenance  Topic Date Due  . INFLUENZA VACCINE  05/21/2017  . HEMOGLOBIN A1C  06/03/2017  . OPHTHALMOLOGY EXAM  07/15/2017  . FOOT EXAM  09/02/2017  . COLONOSCOPY  01/15/2018  . MAMMOGRAM  06/14/2018  . TETANUS/TDAP  09/19/2025  . DEXA SCAN  Completed  . Hepatitis C Screening  Completed  . PNA vac Low Risk Adult  Completed    Lab Results  Component Value Date   WBC 6.8 01/01/2017   HGB 15.2 01/01/2017   HCT 45.6 01/01/2017   PLT 160  01/01/2017   GLUCOSE 138 01/01/2017   CHOL 216 (H) 12/04/2016   TRIG 114.0 12/04/2016   HDL 45.90 12/04/2016   LDLDIRECT 145.6 06/22/2013   LDLCALC 147 (H) 12/04/2016   ALT 63 (H) 01/01/2017   AST 60 (H) 01/01/2017   NA 142 01/01/2017   K 4.6 01/01/2017   CL 105 12/04/2016   CREATININE 0.8 01/01/2017   BUN 10.6 01/01/2017   CO2 26 01/01/2017   TSH 0.80 01/30/2015   INR 0.97 09/25/2011   HGBA1C 6.8 (H) 12/04/2016   MICROALBUR 1.4 05/16/2016    Lab Results  Component Value Date   TSH 0.80 01/30/2015   Lab Results  Component Value Date   WBC 6.8 01/01/2017   HGB 15.2 01/01/2017   HCT 45.6 01/01/2017   MCV 94 01/01/2017   PLT 160 01/01/2017   Lab Results  Component Value Date   NA 142 01/01/2017   K 4.6 01/01/2017   CHLORIDE 103 01/01/2017   CO2 26 01/01/2017   GLUCOSE 138 01/01/2017   BUN 10.6 01/01/2017   CREATININE 0.8 01/01/2017   BILITOT 0.64 01/01/2017   ALKPHOS 49 01/01/2017   AST 60 (H) 01/01/2017   ALT 63 (H) 01/01/2017   PROT 7.9 01/01/2017   ALBUMIN 4.1 01/01/2017   CALCIUM 10.1 01/01/2017   ANIONGAP 13 (H) 01/01/2017   EGFR 70 (L) 01/01/2017   GFR 79.81 12/04/2016   Lab Results  Component Value Date   CHOL 216 (H) 12/04/2016   Lab Results  Component Value Date   HDL 45.90 12/04/2016   Lab Results  Component Value Date   LDLCALC 147 (H) 12/04/2016   Lab Results  Component Value Date   TRIG 114.0 12/04/2016   Lab Results  Component Value Date   CHOLHDL 5 12/04/2016   Lab  Results  Component Value Date   HGBA1C 6.8 (H) 12/04/2016         Assessment & Plan:   Problem List Items Addressed This Visit      Unprioritized   DM (diabetes mellitus) type II, controlled, with peripheral vascular disorder ([place])    hgba1c acceptable, minimize simple carbs. Increase exercise as tolerated. Continue current meds      Relevant Orders   Lipid panel   Comprehensive metabolic panel   Hemoglobin A1c   Microalbumin / creatinine urine ratio     POCT Urinalysis Dipstick (Automated)   Essential hypertension    Well controlled, no changes to meds. Encouraged heart healthy diet such as the DASH diet and exercise as tolerated.       Relevant Orders   Microalbumin / creatinine urine ratio   POCT Urinalysis Dipstick (Automated)   Hyperlipidemia LDL goal <70    Encouraged heart healthy diet, increase exercise, avoid trans fats, consider a krill oil cap daily      Preventative health care - Primary    See avs ghm utd Check labs      Relevant Orders   Microalbumin / creatinine urine ratio   POCT Urinalysis Dipstick (Automated)    Other Visit Diagnoses    Encounter for Medicare annual wellness exam          I have discontinued Ms. Perren's Flaxseed Oil and amoxicillin-clavulanate. I am also having her maintain her fish oil-omega-3 fatty acids, aspirin, OLANZapine, TRUE METRIX AIR GLUCOSE METER, Calcium Carb-Cholecalciferol (CALCIUM + D3 PO), Vitamin D, glucose blood, albuterol, b complex vitamins, LORazepam, citalopram, ezetimibe, metFORMIN, losartan, pantoprazole, budesonide-formoterol, and Spacer/Aero Chamber Mouthpiece.  No orders of the defined types were placed in this encounter.   CMA served as Education administrator during this visit. History, Physical and Plan performed by medical provider. Documentation and orders reviewed and attested to.  Ann Held, DO

## 2017-03-03 NOTE — Assessment & Plan Note (Signed)
hgba1c acceptable, minimize simple carbs. Increase exercise as tolerated. Continue current meds 

## 2017-03-03 NOTE — Progress Notes (Signed)
Subjective:   Michelle Long is a 71 y.o. female who presents for Medicare Annual (Subsequent) preventive examination.  Review of Systems:  No ROS.  Medicare Wellness Visit.  Cardiac Risk Factors include: advanced age (>34men, >3 women);dyslipidemia;diabetes mellitus  Sleep patterns: gets up 0-1 times nightly to void and sleeps 'enough' hours nightly. Takes 30 minute daytime nap.   Home Safety/Smoke Alarms: Feels safe in home. Smoke alarms in place.    Living environment; residence and Firearm Safety: Lives w/ daughter. 1-story house/ trailer, no firearms. Seat Belt Safety/Bike Helmet: Wears seat belt.   Counseling:   Eye Exam- Dr. Ladon Applebaum at [facility] w/ Dr. Clearence Ped twice yearly.  Female:   Pap- N/A, hysterectomy      Mammo- last 06/14/16. BI-RADS CATEGORY  2: Benign.        Dexa scan- last 11/27/15. Osteopenia.         CCS- last 01/15/13 w/ Dr. Acquanetta Sit. 4 polyps removed. 5 year recall.       Objective:     Vitals: BP (!) 110/54 (BP Location: Left Arm, Patient Position: Sitting, Cuff Size: Large)   Pulse 100   Temp 98 F (36.7 C) (Oral)   Ht 5\' 4"  (1.626 m)   Wt 154 lb 3.2 oz (69.9 kg)   SpO2 95% Comment: RA  BMI 26.47 kg/m   Body mass index is 26.47 kg/m.   Tobacco History  Smoking Status  . Former Smoker  . Packs/day: 1.50  . Years: 33.00  . Types: Cigarettes  . Start date: 12/08/1964  . Quit date: 08/21/1998  Smokeless Tobacco  . Never Used    Comment: quit 16 years ago     Counseling given: Not Answered   Past Medical History:  Diagnosis Date  . Asthma   . Bipolar disorder ([place])   . Bladder cancer ([place])   . Breast cancer ([place])   . Cancer of lower-inner quadrant of left female breast ([place]) 12/08/2013  . Depression   . Diabetes mellitus   . GERD (gastroesophageal reflux disease)   . Hepatitis B infection   . History of transfusion of whole blood    Approximately 2009, due to breast cancer/chemo.  Marland Kitchen Hyperlipidemia     Past Surgical History:  Procedure Laterality Date  . ABDOMINAL HYSTERECTOMY    . APPENDECTOMY  1961  . bladder cancer  2006  . BREAST LUMPECTOMY  2007  . PARTIAL HYSTERECTOMY  1991  . TONSILLECTOMY  1951   Family History  Problem Relation Age of Onset  . Arthritis Mother   . Breast cancer Mother   . Transient ischemic attack Mother   . Hypertension Mother   . Heart disease Father   . Diabetes Father   . Cancer Father        bladder cancer  . Throat cancer Father    History  Sexual Activity  . Sexual activity: Not Currently  . Partners: Male    Outpatient Encounter Prescriptions as of 03/03/2017  Medication Sig  . albuterol (PROVENTIL HFA) 108 (90 Base) MCG/ACT inhaler Inhale 2 puffs into the lungs every 6 (six) hours as needed for wheezing or shortness of breath.  Marland Kitchen aspirin 81 MG tablet Take 81 mg by mouth daily.  Marland Kitchen b complex vitamins tablet Take 1 tablet by mouth daily.  . Blood Glucose Monitoring Suppl (TRUE METRIX AIR GLUCOSE METER) DEVI   . budesonide-formoterol (SYMBICORT) 160-4.5 MCG/ACT inhaler Inhale 2 puffs into the lungs 2 (two) times  daily.  . Calcium Carb-Cholecalciferol (CALCIUM + D3 PO) Take by mouth.  . Cholecalciferol (VITAMIN D) 2000 units CAPS Take 1 capsule by mouth daily.  . citalopram (CELEXA) 10 MG tablet Take 0.5 tablets (5 mg total) by mouth at bedtime. Taking 5 mg. Only.  . ezetimibe (ZETIA) 10 MG tablet Take 1 tablet (10 mg total) by mouth daily.  . fish oil-omega-3 fatty acids 1000 MG capsule Take 1 g by mouth daily.    Marland Kitchen glucose blood (TRUE METRIX BLOOD GLUCOSE TEST) test strip Check your blood sugar once daily. Dx code:E11.9  . LORazepam (ATIVAN) 0.5 MG tablet Take 1 tablet (0.5 mg total) by mouth 2 (two) times daily as needed.  Marland Kitchen losartan (COZAAR) 50 MG tablet TAKE 1 TABLET EVERY DAY  . metFORMIN (GLUCOPHAGE) 1000 MG tablet Take 1 tablet (1,000 mg total) by mouth 2 (two) times daily with a meal.  . OLANZapine (ZYPREXA) 5 MG tablet Take 5 mg  by mouth daily.  . pantoprazole (PROTONIX) 40 MG tablet TAKE 1 TABLET EVERY DAY  . Spacer/Aero Chamber Mouthpiece MISC To use with inhaler  . [DISCONTINUED] amoxicillin-clavulanate (AUGMENTIN) 875-125 MG tablet Take 1 tablet by mouth 2 (two) times daily. (Patient not taking: Reported on 03/03/2017)  . [DISCONTINUED] Flaxseed, Linseed, (FLAXSEED OIL) 1000 MG CAPS Take 1 capsule by mouth 2 (two) times daily.   No facility-administered encounter medications on file as of 03/03/2017.     Activities of Daily Living In your present state of health, do you have any difficulty performing the following activities: 03/03/2017  Hearing? N  Vision? N  Difficulty concentrating or making decisions? N  Walking or climbing stairs? N  Dressing or bathing? N  Doing errands, shopping? N  Preparing Food and eating ? N  Using the Toilet? N  Managing your Medications? N  Managing your Finances? N  Housekeeping or managing your Housekeeping? N  Some recent data might be hidden    Patient Care Team: Carollee Herter, Alferd Apa, DO as PCP - General (Family Medicine) Hale Bogus., MD as Referring Physician (Gastroenterology) Franchot Gallo, MD as Consulting Physician (Urology) Ricard Dillon, MD (Psychiatry) Trula Slade, DPM as Consulting Physician (Podiatry) Aretha Parrot as Consulting Physician (Optometry) Clearence Ped, DDS (Dentistry)    Assessment:    Physical assessment deferred to PCP.  Exercise Activities and Dietary recommendations Current Exercise Habits: Home exercise routine, Type of exercise: walking  Diet (meal preparation, eat out, water intake, caffeinated beverages, dairy products, fruits and vegetables): in general, a "healthy" diet  , well balanced, on average, 2-3 meals per day. Protein and salad with most meals. Drinks 2 16 oz bottles of water daily.    Goals    . Eat more fruits and vegetables    . Increase water intake      Fall Risk Fall Risk  03/03/2017  12/10/2016 07/03/2016 01/01/2016 11/17/2015  Falls in the past year? No No No No No  Risk for fall due to : - - - - -   Depression Screen PHQ 2/9 Scores 03/03/2017 12/10/2016 11/17/2015 01/30/2015  PHQ - 2 Score 0 0 0 0     Cognitive Function MMSE - Mini Mental State Exam 03/03/2017  Orientation to time 5  Orientation to Place 5  Registration 3  Attention/ Calculation 5  Recall 3  Language- name 2 objects 2  Language- repeat 1  Language- follow 3 step command 3  Language- read & follow direction 1  Write a sentence 1  Copy design 1  Total score 30        Immunization History  Administered Date(s) Administered  . Influenza Whole 07/01/2012  . Influenza,inj,Quad PF,36+ Mos 08/12/2013, 07/22/2014, 07/03/2016  . Influenza-Unspecified 09/20/2015  . Pneumococcal Conjugate-13 01/30/2015  . Pneumococcal Polysaccharide-23 10/23/2003, 10/22/2013  . Td 06/22/2013  . Tdap 09/20/2015  . Zoster 09/07/2013   Screening Tests Health Maintenance  Topic Date Due  . INFLUENZA VACCINE  05/21/2017  . HEMOGLOBIN A1C  06/03/2017  . OPHTHALMOLOGY EXAM  07/15/2017  . FOOT EXAM  09/02/2017  . COLONOSCOPY  01/15/2018  . MAMMOGRAM  06/14/2018  . TETANUS/TDAP  09/19/2025  . DEXA SCAN  Completed  . Hepatitis C Screening  Completed  . PNA vac Low Risk Adult  Completed      Plan:    Follow-up w/ PCP as scheduled.  Bring a copy of your advance directives to your next office visit.   Pt plans to get Shingrix vaccine at the pharmacy.  I have personally reviewed and noted the following in the patient's chart:   . Medical and social history . Use of alcohol, tobacco or illicit drugs  . Current medications and supplements . Functional ability and status . Nutritional status . Physical activity . Advanced directives . List of other physicians . Vitals . Screenings to include cognitive, depression, and falls . Referrals and appointments  In addition, I have reviewed and discussed with patient  certain preventive protocols, quality metrics, and best practice recommendations. A written personalized care plan for preventive services as well as general preventive health recommendations were provided to patient.     Dorrene German, RN  03/03/2017

## 2017-03-03 NOTE — Assessment & Plan Note (Signed)
Well controlled, no changes to meds. Encouraged heart healthy diet such as the DASH diet and exercise as tolerated.  °

## 2017-03-04 ENCOUNTER — Ambulatory Visit: Payer: Medicare HMO | Admitting: Podiatry

## 2017-03-04 LAB — MICROALBUMIN / CREATININE URINE RATIO
Creatinine,U: 144 mg/dL
Microalb Creat Ratio: 2.6 mg/g (ref 0.0–30.0)
Microalb, Ur: 3.8 mg/dL — ABNORMAL HIGH (ref 0.0–1.9)

## 2017-03-06 ENCOUNTER — Telehealth: Payer: Self-pay | Admitting: Family Medicine

## 2017-03-06 ENCOUNTER — Other Ambulatory Visit: Payer: Self-pay | Admitting: Family Medicine

## 2017-03-06 DIAGNOSIS — E119 Type 2 diabetes mellitus without complications: Secondary | ICD-10-CM

## 2017-03-06 DIAGNOSIS — E785 Hyperlipidemia, unspecified: Secondary | ICD-10-CM

## 2017-03-06 NOTE — Telephone Encounter (Signed)
Relation to ZF:POIP Call back number: [phone]  Reason for call:  Patient requesting most recent lab results please fax Dr. Shana Chute (gastro) [phone] Attention Tyla

## 2017-03-06 NOTE — Telephone Encounter (Signed)
Lab results faxed as requested.

## 2017-03-11 ENCOUNTER — Ambulatory Visit: Payer: Medicare HMO | Admitting: Podiatry

## 2017-03-18 ENCOUNTER — Encounter: Payer: Self-pay | Admitting: Podiatry

## 2017-03-18 ENCOUNTER — Ambulatory Visit (INDEPENDENT_AMBULATORY_CARE_PROVIDER_SITE_OTHER): Payer: Medicare HMO | Admitting: Podiatry

## 2017-03-18 DIAGNOSIS — M79676 Pain in unspecified toe(s): Secondary | ICD-10-CM | POA: Diagnosis not present

## 2017-03-18 DIAGNOSIS — R748 Abnormal levels of other serum enzymes: Secondary | ICD-10-CM | POA: Diagnosis not present

## 2017-03-18 DIAGNOSIS — B351 Tinea unguium: Secondary | ICD-10-CM

## 2017-03-18 DIAGNOSIS — Q828 Other specified congenital malformations of skin: Secondary | ICD-10-CM | POA: Diagnosis not present

## 2017-03-18 DIAGNOSIS — E119 Type 2 diabetes mellitus without complications: Secondary | ICD-10-CM

## 2017-03-18 NOTE — Progress Notes (Signed)
Patient ID: Michelle Long, female   DOB: 10-16-1946, 71 y.o.   MRN: [id number]  Subjective: 71 year old female presents the office today for diabetic risk assessment and for painful calluses and toenails to both of her feet. She states they both get painful as they grow as well as with the calluses. Denies any acute changes. Denies any systemic complaints such as fevers, chills, nausea, vomiting. No acute changes since last appointment.  Objective: AAO x3, NAD DP/PT pulses palpable bilaterally, CRT less than 3 seconds Protective sensation intact with [name] monofilament Hyperkeratotic lesions present bilateral submetatarsal one and 5 and right medial hallux as well as the 4th interspace on the right. Upon debridement no underlying ulceration, drainage or other signs of infection. Nails are hypertrophic, dystrophic, brittle, discolored, elongated 10. Particular bilateral hallux nails and a significant. No areas of pinpoint bony tenderness or pain with vibratory sensation. MMT 5/5, ROM WNL. No edema, erythema, increase in warmth to bilateral lower extremities.  No other open lesions or pre-ulcerative lesions.  No pain with calf compression, swelling, warmth, erythema  Assessment: Symptomatic hyperkeratotic lesions, symptomatic onychomycosis  Plan: -All treatment options discussed with the patient including all alternatives, risks, complications.   -Discussed daily foot inspection. -Calluses debrided 6 without complications or bleeding. -Nails debrided 10 without complications or bleeding.   -Follow-up in 3 months. -Patient encouraged to call the office with any questions, concerns, change in symptoms.   Celesta Gentile, DPM

## 2017-03-20 ENCOUNTER — Other Ambulatory Visit: Payer: Self-pay | Admitting: Family Medicine

## 2017-03-30 NOTE — Progress Notes (Signed)
Cardiology Office Note    Date:  04/01/2017   ID:  Michelle Long, DOB 1946-02-05, MRN [id number]  PCP:  Carollee Herter, Alferd Apa, DO  Cardiologist: New to [facility] - Dr. Oval Linsey  Chief Complaint  Patient presents with  . New Patient (Initial Visit)    referral for HLD    History of Present Illness:    Michelle Long is a 71 y.o. female with past medical history of Type 2 DM, HTN, HLD, Bipolar Disorder, GERD, and history of breast cancer who presents to the office today for evaluation of Hyperlipidemia at the request of Dr. Cheri Rous.  In talking with the patient today, she denies any prior history of CAD or prior MIs. No history of CVA or TIAs. She was treated for breast cancer approximately 10 years ago and reports having chemotherapy-induced cardiomyopathy secondary to her treatments but this resolved once treatments were complete. She does have known hyperlipidemia, hypertension, and type 2 diabetes which is followed by her PCP. She was referred to our office due to her recent lipid panel showing a total cholesterol of 221, triglycerides 156, HDL 45, and LDL 144.  She has been intolerant to multiple statins in the past including Atorvastatin 20 mg daily, Pravastatin 40 mg daily, and Crestor 10 mg daily. She tried these all for several months at a time but developed aches along her chest and legs with significant fatigue. She is unsure if she ever tried taking any of these on a once or twice weekly dosing. She was started on Zetia approximately 3 months ago and is tolerating this well but her Lipid Panel has not significantly changed. Her LFT's are followed closely as they have been mildly elevated for several years (AST 59 and ALT 64 on most recent check).   She denies any recent chest discomfort or dyspnea on exertion. No orthopnea, PND, lower extremity edema, palpitations, lightheadedness, dizziness, or presyncope. She is not overly active at baseline but is able to perform household chores without  any anginal symptoms.   Reports her father had CAD which was diagnosed in his late 36's. Her mother suffered from [place]. The patient did smoke for over 30 years but quit in 1999. She denies any alcohol use or recreational drug use.   Past Medical History:  Diagnosis Date  . Asthma   . Bipolar disorder ([place])   . Bladder cancer ([place])   . Breast cancer ([place])   . Cancer of lower-inner quadrant of left female breast ([place]) 12/08/2013  . Depression   . Diabetes mellitus   . GERD (gastroesophageal reflux disease)   . Hepatitis B infection   . History of transfusion of whole blood    Approximately 2009, due to breast cancer/chemo.  Marland Kitchen Hyperlipidemia     Past Surgical History:  Procedure Laterality Date  . ABDOMINAL HYSTERECTOMY    . APPENDECTOMY  1961  . bladder cancer  2006  . BREAST LUMPECTOMY  2007  . PARTIAL HYSTERECTOMY  1991  . TONSILLECTOMY  1951    Current Medications: Outpatient Medications Prior to Visit  Medication Sig Dispense Refill  . albuterol (PROVENTIL HFA) 108 (90 Base) MCG/ACT inhaler Inhale 2 puffs into the lungs every 6 (six) hours as needed for wheezing or shortness of breath. 3 Inhaler 1  . aspirin 81 MG tablet Take 81 mg by mouth daily.    Marland Kitchen b complex vitamins tablet Take 1 tablet by mouth daily.    . Blood Glucose Monitoring Suppl (TRUE METRIX  AIR GLUCOSE METER) DEVI     . budesonide-formoterol (SYMBICORT) 160-4.5 MCG/ACT inhaler Inhale 2 puffs into the lungs 2 (two) times daily. 10.2 g 5  . Calcium Carb-Cholecalciferol (CALCIUM + D3 PO) Take by mouth.    . Cholecalciferol (VITAMIN D) 2000 units CAPS Take 1 capsule by mouth daily.    . citalopram (CELEXA) 10 MG tablet Take 0.5 tablets (5 mg total) by mouth at bedtime. Taking 5 mg. Only. 90 tablet 1  . ezetimibe (ZETIA) 10 MG tablet Take 1 tablet (10 mg total) by mouth daily. 90 tablet 1  . fish oil-omega-3 fatty acids 1000 MG capsule Take 1 g by mouth daily.      Marland Kitchen LORazepam (ATIVAN) 0.5 MG tablet Take 1  tablet (0.5 mg total) by mouth 2 (two) times daily as needed. 90 tablet 0  . losartan (COZAAR) 50 MG tablet TAKE 1 TABLET EVERY DAY 90 tablet 1  . metFORMIN (GLUCOPHAGE) 1000 MG tablet Take 1 tablet (1,000 mg total) by mouth 2 (two) times daily with a meal. 180 tablet 1  . OLANZapine (ZYPREXA) 5 MG tablet Take 5 mg by mouth daily.    . pantoprazole (PROTONIX) 40 MG tablet TAKE 1 TABLET EVERY DAY 90 tablet 0  . Spacer/Aero Chamber Mouthpiece MISC To use with inhaler 1 each 1  . TRUE METRIX BLOOD GLUCOSE TEST test strip CHECK YOUR BLOOD SUGAR ONE TIME DAILY 100 each 3   No facility-administered medications prior to visit.      Allergies:   Lamictal [lamotrigine]; Prozac [fluoxetine hcl]; Statins; and Sulfa drugs cross reactors   Social History   Social History  . Marital status: Single    Spouse name: N/A  . Number of children: N/A  . Years of education: N/A   Occupational History  .      retired   Social History Main Topics  . Smoking status: Former Smoker    Packs/day: 1.50    Years: 33.00    Types: Cigarettes    Start date: 12/08/1964    Quit date: 08/21/1998  . Smokeless tobacco: Never Used     Comment: quit 16 years ago  . Alcohol use 0.0 oz/week     Comment: very rarely  . Drug use: No  . Sexual activity: Not Currently    Partners: Male   Other Topics Concern  . None   Social History Narrative   Exercise--- walks with neighbor     Family History:  The patient's family history includes Arthritis in her mother; Breast cancer in her mother; Cancer in her father; Diabetes in her father; Heart disease in her father; Hypertension in her mother; Throat cancer in her father; Transient ischemic attack in her mother.   Review of Systems:   Please see the history of present illness.     General:  No chills, fever, night sweats or weight changes.  Cardiovascular:  No chest pain, dyspnea on exertion, edema, orthopnea, palpitations, paroxysmal nocturnal  dyspnea. Dermatological: No rash, lesions/masses Respiratory: No cough, dyspnea Urologic: No hematuria, dysuria Abdominal:   No nausea, vomiting, diarrhea, bright red blood per rectum, melena, or hematemesis Neurologic:  No visual changes, wkns, changes in mental status.  She denies any of the above symptoms.   All other systems reviewed and are otherwise negative except as noted above.   Physical Exam:    VS:  BP 130/66   Pulse 99   Ht 5\' 4"  (1.626 m)   Wt 156 lb 6.4 oz (70.9 kg)  BMI 26.85 kg/m    General: Well developed, well nourished Caucasian female appearing in no acute distress. Head: Normocephalic, atraumatic, sclera non-icteric, no xanthomas, nares are without discharge.  Neck: No carotid bruits. JVD not elevated. No xanthomas appreciated.  Lungs: Respirations regular and unlabored, without wheezes or rales.  Heart: Regular rate and rhythm. No S3 or S4.  No murmur, no rubs, or gallops appreciated. Abdomen: Soft, non-tender, non-distended with normoactive bowel sounds. No hepatomegaly. No rebound/guarding. No obvious abdominal masses. Msk:  Strength and tone appear normal for age. No joint deformities or effusions. Extremities: No clubbing or cyanosis. No edema.  Distal pedal pulses are 2+ bilaterally. Neuro: Alert and oriented X 3. Moves all extremities spontaneously. No focal deficits noted. Psych:  Responds to questions appropriately with a normal affect. Skin: No rashes or lesions noted  Wt Readings from Last 3 Encounters:  04/01/17 156 lb 6.4 oz (70.9 kg)  03/03/17 154 lb 3.2 oz (69.9 kg)  01/01/17 152 lb 6.4 oz (69.1 kg)     Studies/Labs Reviewed:   EKG:  EKG is ordered today.  The ekg ordered today demonstrates NSR, HR 99, with no acute ST or T-wave changes.   Recent Labs: 03/03/2017: ALT 64; BUN 13; Creatinine, Ser 0.73; Hemoglobin 14.6; Platelets 188.0; Potassium 4.4; Sodium 140   Lipid Panel    Component Value Date/Time   CHOL 221 (H) 03/03/2017 1427    TRIG 156.0 (H) 03/03/2017 1427   HDL 45.60 03/03/2017 1427   CHOLHDL 5 03/03/2017 1427   VLDL 31.2 03/03/2017 1427   LDLCALC 144 (H) 03/03/2017 1427   LDLDIRECT 145.6 06/22/2013 1048    Additional studies/ records that were reviewed today include:   None on File.   Assessment:    1. Hyperlipidemia LDL goal <100   2. Elevated LFTs   3. Essential hypertension   4. Type 2 diabetes mellitus with complication, without long-term current use of insulin ([place])   5. Cardiomyopathy due to chemotherapy [facility])      Plan:   In order of problems listed above:  1. Hyperlipidemia/ Elevated LFT's - most recent Lipid Panel showed a total cholesterol of 221, triglycerides 156, HDL 45, and LDL 144. With her known Type 2 DM, her LDL goal is < 100. - She has been intolerant to multiple statins in the past including Atorvastatin 20 mg daily, Pravastatin 40 mg daily, and Crestor 10 mg daily. She tried these for several months at a time but developed aches along her chest and legs with significant fatigue. She is unsure if she ever tried taking any of these on a once or twice weekly dosing.  - she would not qualify for a PCSK-9 inhibitor as these are not yet indicated for Primary Prevention and she does not have a history of known CAD, prior MI's, CVA, or TIA's.  - I discussed the patient with Pharmacy as well as Dr. Oval Linsey (D[facility]) as she is new to our practice. Will start Crestor 5mg  initially at once a week. If able to tolerate this, can increase to 3x weekly after 3 weeks. Should continue on Zetia 10mg  daily. She will need a repeat FLP and LFT's in 3 months. If LFT's remain stable and she is tolerating 3x weekly dosing, can then increase to 5mg  daily.   2. HTN - BP is well-controlled at 130/66 during today's visit. - continue Losartan 50mg  daily.   3. Type 2 DM - most recent Hgb A1c at 6.6 when checked on 03/03/17. - followed  by her PCP.   4. History of Cardiomyopathy with Chemotherapy - reports  this occurred 10+ years ago with treatment of her breast cancer. Once chemo was complete, her EF normalized.  - she denies any recent orthopnea, PND, or lower extremity edema and does not appear volume overloaded by physical examination.  - no further testing indicated at this time.   Medication Adjustments/Labs and Tests Ordered: Current medicines are reviewed at length with the patient today.  Concerns regarding medicines are outlined above.  Medication changes, Labs and Tests ordered today are listed in the Patient Instructions below. Patient Instructions  Medication Instructions:  START CRESTOR 5MG  WEEKLY FOR 3 WEEKS; THEN INCREASE TO 3 TIMES WEEKLY THEREAFTER  If you need a refill on your cardiac medications before your next appointment, please call your pharmacy.  Labwork: FLP AND LFT AT PCP IN 3 MONTHS.   Follow-Up: Your physician wants you to follow-up in: AS NEEDED WITH DR Solana.   Thank you for choosing [facility] at [employer], [place] [address], [place]  04/01/2017 7:28 PM    [address], [place], [place]  [address] Phone: [phone]; Fax: [phone]  [address], [place], [place] [address] Phone: [phone]

## 2017-04-01 ENCOUNTER — Ambulatory Visit (INDEPENDENT_AMBULATORY_CARE_PROVIDER_SITE_OTHER): Payer: Medicare HMO | Admitting: Student

## 2017-04-01 ENCOUNTER — Encounter: Payer: Self-pay | Admitting: Student

## 2017-04-01 VITALS — BP 130/66 | HR 99 | Ht 64.0 in | Wt 156.4 lb

## 2017-04-01 DIAGNOSIS — R7989 Other specified abnormal findings of blood chemistry: Secondary | ICD-10-CM

## 2017-04-01 DIAGNOSIS — I10 Essential (primary) hypertension: Secondary | ICD-10-CM

## 2017-04-01 DIAGNOSIS — T451X5A Adverse effect of antineoplastic and immunosuppressive drugs, initial encounter: Secondary | ICD-10-CM

## 2017-04-01 DIAGNOSIS — E785 Hyperlipidemia, unspecified: Secondary | ICD-10-CM

## 2017-04-01 DIAGNOSIS — E118 Type 2 diabetes mellitus with unspecified complications: Secondary | ICD-10-CM

## 2017-04-01 DIAGNOSIS — I427 Cardiomyopathy due to drug and external agent: Secondary | ICD-10-CM

## 2017-04-01 DIAGNOSIS — R945 Abnormal results of liver function studies: Secondary | ICD-10-CM

## 2017-04-01 MED ORDER — ROSUVASTATIN CALCIUM 5 MG PO TABS: 5.0000 mg | ORAL_TABLET | ORAL | 3 refills | Status: DC

## 2017-04-01 NOTE — Patient Instructions (Signed)
Medication Instructions:  START CRESTOR 5MG  WEEKLY FOR 3 WEEKS; THEN INCREASE TO 3 TIMES WEEKLY THEREAFTER  If you need a refill on your cardiac medications before your next appointment, please call your pharmacy.  Labwork: FLP AND LFT AT PCP IN 3 MONTHS.   Follow-Up: Your physician wants you to follow-up in: AS NEEDED WITH DR [place].   Thank you for choosing [facility] at [facility]!!

## 2017-04-02 ENCOUNTER — Encounter: Payer: Self-pay | Admitting: Family Medicine

## 2017-04-09 DIAGNOSIS — K219 Gastro-esophageal reflux disease without esophagitis: Secondary | ICD-10-CM | POA: Diagnosis not present

## 2017-04-09 DIAGNOSIS — R748 Abnormal levels of other serum enzymes: Secondary | ICD-10-CM | POA: Diagnosis not present

## 2017-04-09 DIAGNOSIS — K7581 Nonalcoholic steatohepatitis (NASH): Secondary | ICD-10-CM | POA: Diagnosis not present

## 2017-04-28 ENCOUNTER — Other Ambulatory Visit: Payer: Self-pay | Admitting: Family Medicine

## 2017-04-28 DIAGNOSIS — K219 Gastro-esophageal reflux disease without esophagitis: Secondary | ICD-10-CM

## 2017-05-03 ENCOUNTER — Other Ambulatory Visit: Payer: Self-pay | Admitting: Family Medicine

## 2017-05-16 ENCOUNTER — Other Ambulatory Visit: Payer: Self-pay | Admitting: Family Medicine

## 2017-05-16 DIAGNOSIS — Z1231 Encounter for screening mammogram for malignant neoplasm of breast: Secondary | ICD-10-CM

## 2017-05-31 ENCOUNTER — Other Ambulatory Visit: Payer: Self-pay | Admitting: Family Medicine

## 2017-06-04 ENCOUNTER — Other Ambulatory Visit: Payer: Self-pay | Admitting: Family Medicine

## 2017-06-06 ENCOUNTER — Other Ambulatory Visit (INDEPENDENT_AMBULATORY_CARE_PROVIDER_SITE_OTHER): Payer: Medicare HMO

## 2017-06-06 ENCOUNTER — Other Ambulatory Visit: Payer: Medicare HMO

## 2017-06-06 DIAGNOSIS — E785 Hyperlipidemia, unspecified: Secondary | ICD-10-CM

## 2017-06-06 DIAGNOSIS — E119 Type 2 diabetes mellitus without complications: Secondary | ICD-10-CM

## 2017-06-06 LAB — COMPREHENSIVE METABOLIC PANEL
ALT: 53 U/L — ABNORMAL HIGH (ref 0–35)
AST: 49 U/L — ABNORMAL HIGH (ref 0–37)
Albumin: 4.1 g/dL (ref 3.5–5.2)
Alkaline Phosphatase: 33 U/L — ABNORMAL LOW (ref 39–117)
BUN: 10 mg/dL (ref 6–23)
CO2: 31 mEq/L (ref 19–32)
Calcium: 9.6 mg/dL (ref 8.4–10.5)
Chloride: 103 mEq/L (ref 96–112)
Creatinine, Ser: 0.8 mg/dL (ref 0.40–1.20)
GFR: 75.12 mL/min (ref >60.00)
Glucose, Bld: 108 mg/dL — ABNORMAL HIGH (ref 70–99)
Potassium: 4.3 mEq/L (ref 3.5–5.1)
Sodium: 141 mEq/L (ref 135–145)
Total Bilirubin: 0.6 mg/dL (ref 0.2–1.2)
Total Protein: 6.7 g/dL (ref 6.0–8.3)

## 2017-06-06 LAB — LIPID PANEL
Cholesterol: 125 mg/dL (ref 0–200)
HDL: 43.1 mg/dL (ref >39.00)
LDL Cholesterol: 58 mg/dL (ref 0–99)
NonHDL: 82.21
Total CHOL/HDL Ratio: 3
Triglycerides: 120 mg/dL (ref 0.0–149.0)
VLDL: 24 mg/dL (ref 0.0–40.0)

## 2017-06-06 LAB — HEMOGLOBIN A1C: Hgb A1c MFr Bld: 6.3 % (ref 4.6–6.5)

## 2017-06-09 NOTE — Addendum Note (Signed)
Addended by: Marjory Lies on: 06/09/2017 11:14 AM   Modules accepted: Orders

## 2017-06-16 ENCOUNTER — Ambulatory Visit
Admission: RE | Admit: 2017-06-16 | Discharge: 2017-06-16 | Disposition: A | Discharge status: Home / Self Care | Payer: Medicare HMO | Source: Ambulatory Visit | Attending: Family Medicine | Admitting: Family Medicine

## 2017-06-16 DIAGNOSIS — Z1231 Encounter for screening mammogram for malignant neoplasm of breast: Secondary | ICD-10-CM | POA: Diagnosis not present

## 2017-06-16 IMAGING — MG 2D DIGITAL SCREENING BILATERAL MAMMOGRAM WITH CAD AND ADJUNCT TO
9 of 12 series · 9 of 28 positions shown · non-contrast
Comparison: Previous exam(s).

CLINICAL DATA: Screening.

EXAM:
2D DIGITAL SCREENING BILATERAL MAMMOGRAM WITH CAD AND ADJUNCT TOMO

[R CC]
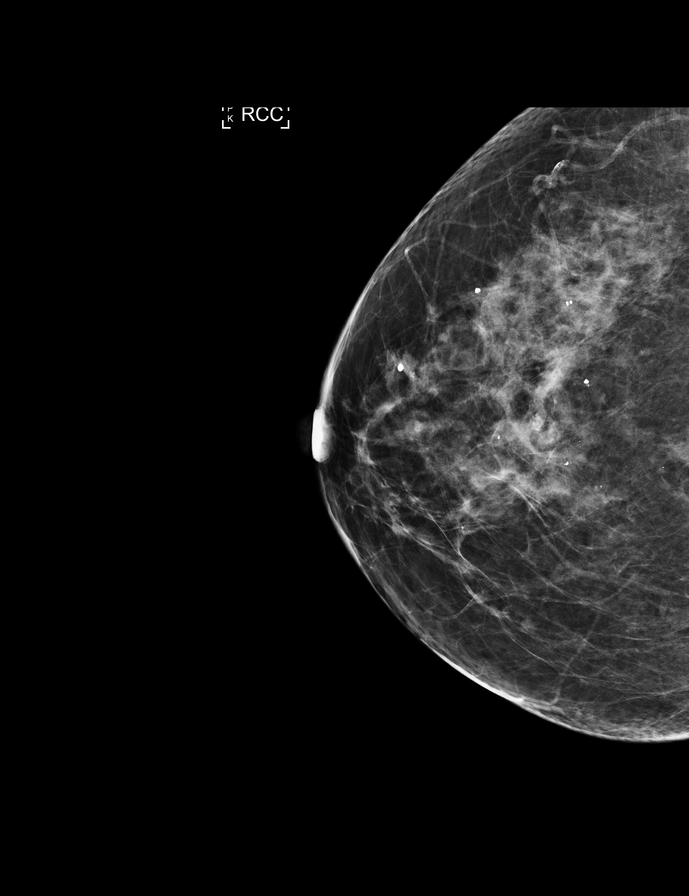

[L MLO]
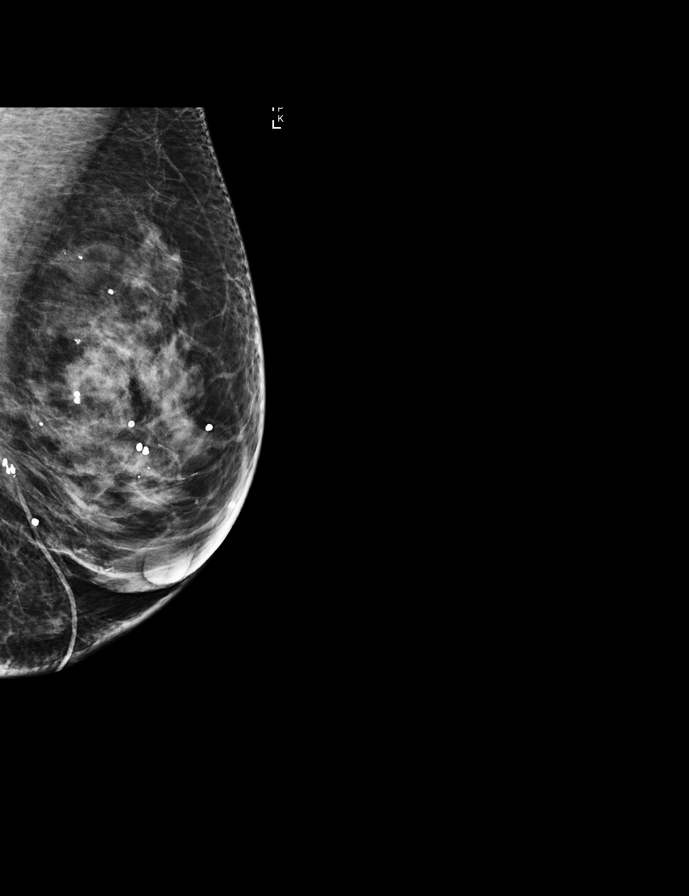

[R MLO synth-2D]
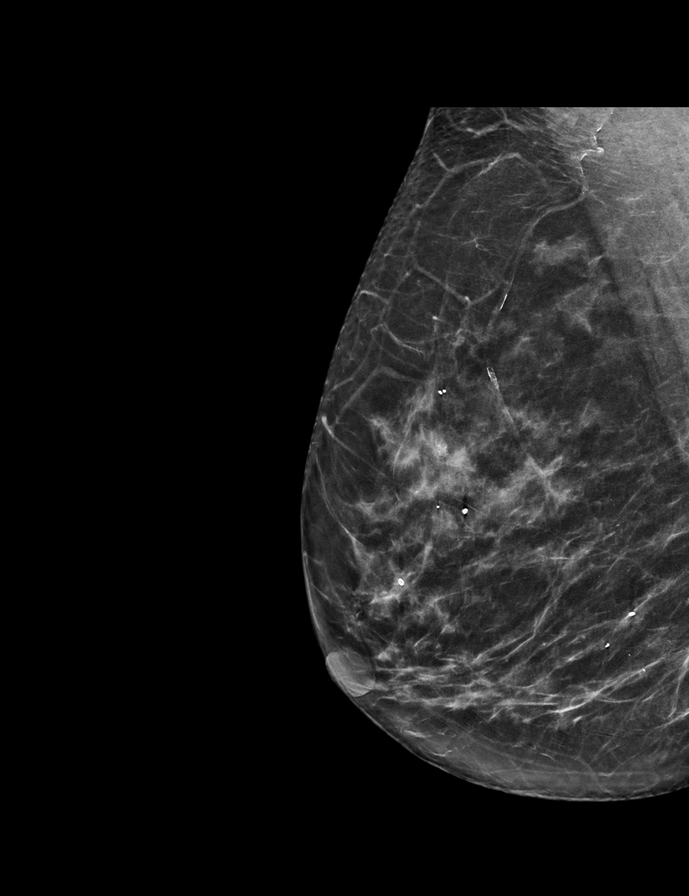

[L CC synth-2D]
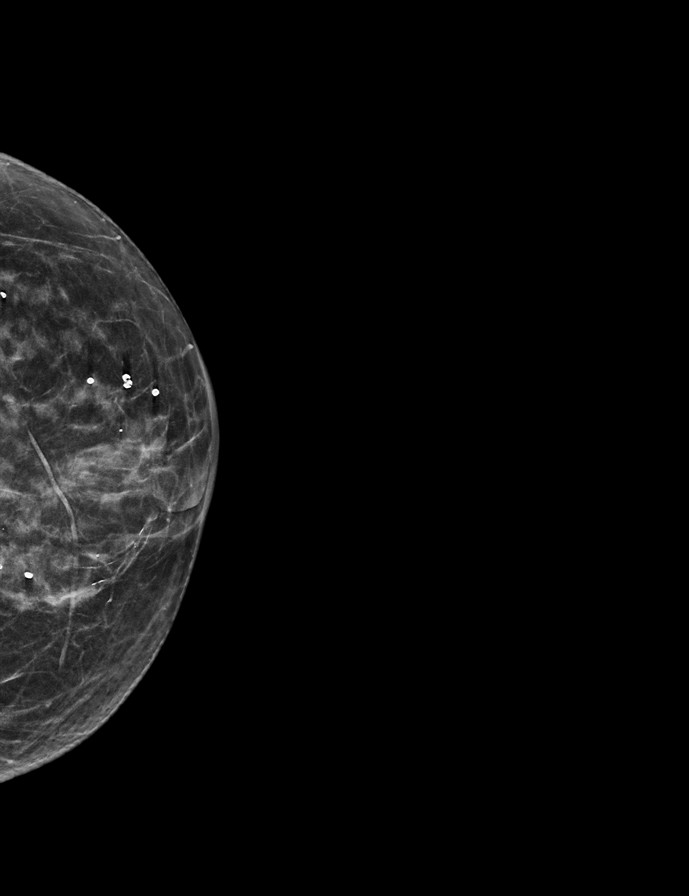

[R CC synth-2D]
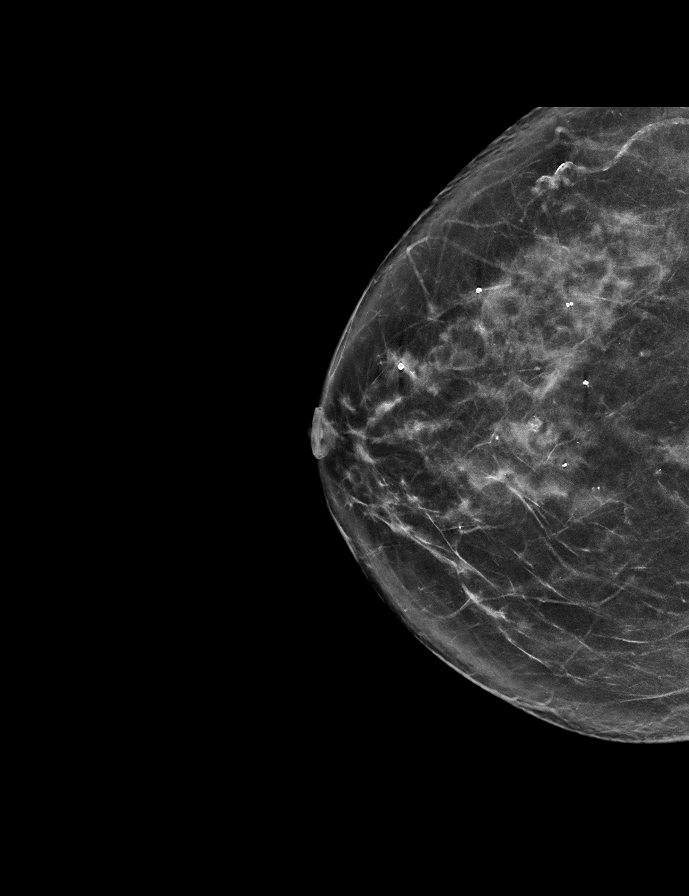

[R MLO]
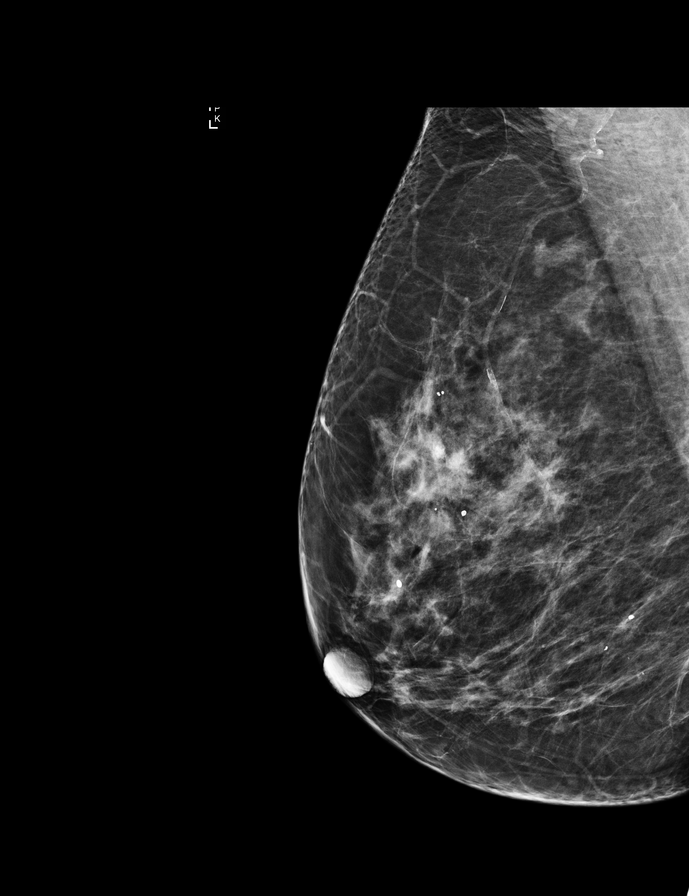

[L CC]
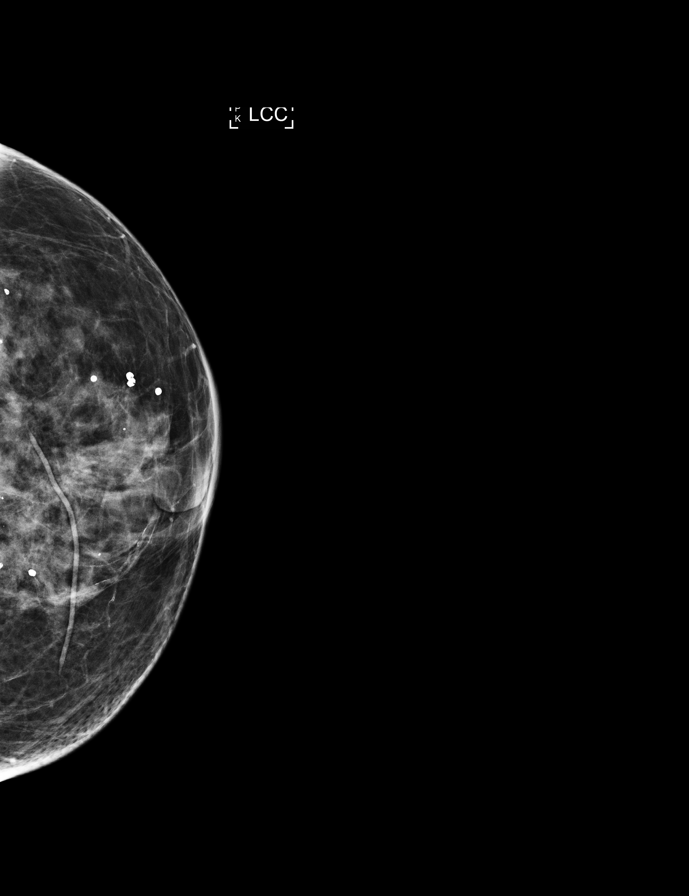

[L MLO synth-2D]
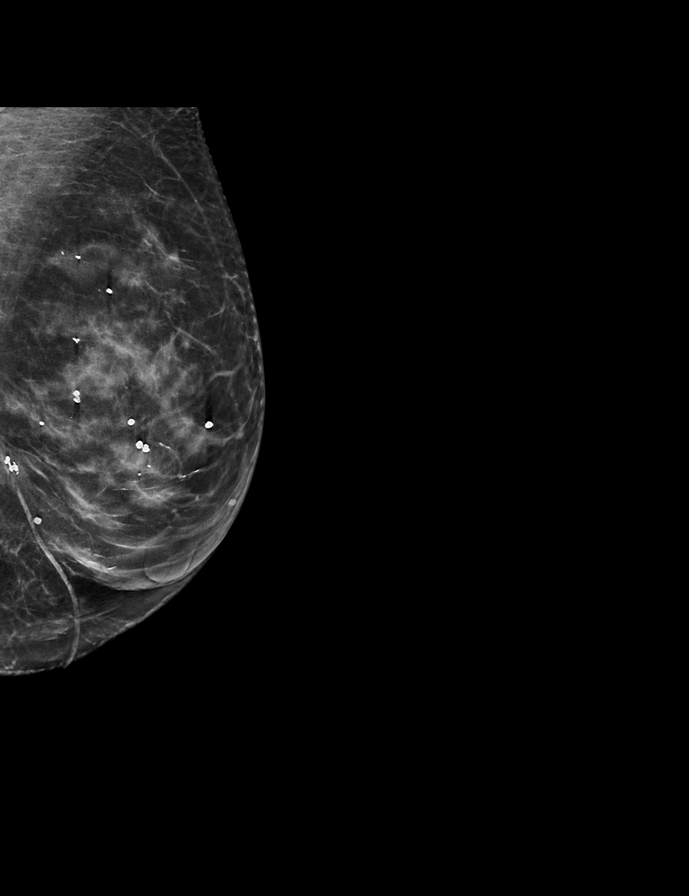

[L MLO tomo · tomo slice 31/60.0]
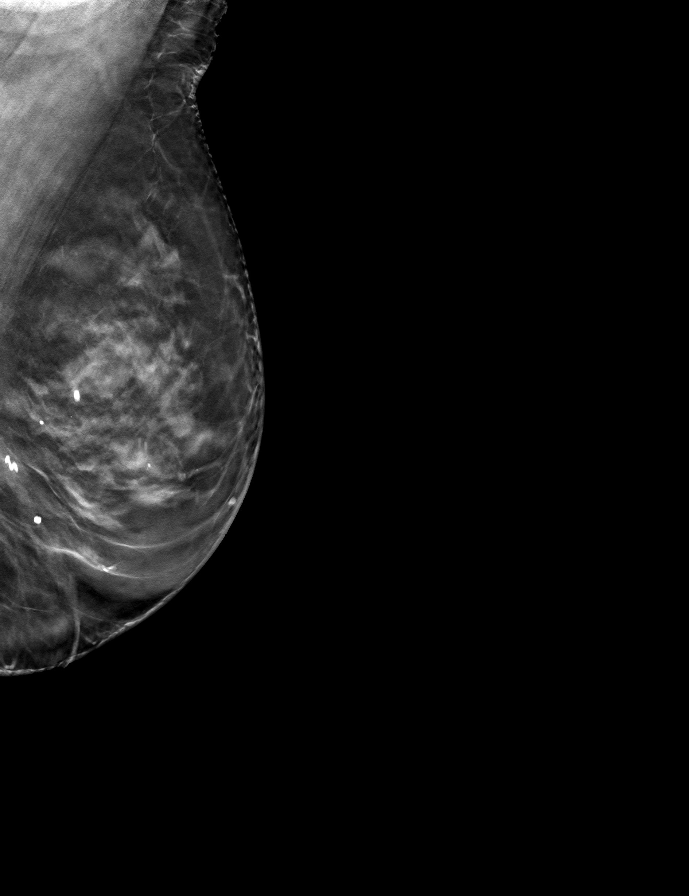

[9 of 28 positions shown; findings below may reference images not displayed]

ACR Breast Density Category c: The breast tissue is heterogeneously
dense, which may obscure small masses.
FINDINGS: There are no findings suspicious for malignancy. Images were
processed with CAD.
IMPRESSION: No mammographic evidence of malignancy. A result letter of this
screening mammogram will be mailed directly to the patient.

RECOMMENDATION:
Screening mammogram in one year. (Code:[TA])

BI-RADS CATEGORY  1: Negative.

## 2017-06-17 ENCOUNTER — Encounter: Payer: Self-pay | Admitting: Podiatry

## 2017-06-17 ENCOUNTER — Ambulatory Visit (INDEPENDENT_AMBULATORY_CARE_PROVIDER_SITE_OTHER): Payer: Medicare HMO | Admitting: Podiatry

## 2017-06-17 DIAGNOSIS — B351 Tinea unguium: Secondary | ICD-10-CM

## 2017-06-17 DIAGNOSIS — E119 Type 2 diabetes mellitus without complications: Secondary | ICD-10-CM | POA: Diagnosis not present

## 2017-06-17 DIAGNOSIS — Q828 Other specified congenital malformations of skin: Secondary | ICD-10-CM | POA: Diagnosis not present

## 2017-06-17 DIAGNOSIS — M79676 Pain in unspecified toe(s): Secondary | ICD-10-CM | POA: Diagnosis not present

## 2017-06-17 NOTE — Progress Notes (Signed)
Patient ID: Michelle Long, female   DOB: 04-27-1946, 71 y.o.   MRN: [id number]  Subjective: 71 year old female presents the office today for diabetic risk assessment and for painful calluses and toenails to both of her feet. She states they both get painful as they grow as well as with the calluses. Denies any acute changes. Denies any systemic complaints such as fevers, chills, nausea, vomiting. No acute changes since last appointment.  Objective: AAO x3, NAD DP/PT pulses palpable bilaterally, CRT less than 3 seconds Protective sensation intact with [name] monofilament Hyperkeratotic lesions present bilateral submetatarsal one and 5 and right medial hallux as well as the 4th interspace on the right. Upon debridement no underlying ulceration, drainage or other signs of infection. Nails are hypertrophic, dystrophic, brittle, discolored, elongated 10. Particular bilateral hallux nails and a significant. No areas of pinpoint bony tenderness or pain with vibratory sensation. MMT 5/5, ROM WNL. No edema, erythema, increase in warmth to bilateral lower extremities.  No other open lesions or pre-ulcerative lesions.  No pain with calf compression, swelling, warmth, erythema  Assessment: Symptomatic hyperkeratotic lesions, symptomatic onychomycosis  Plan: -All treatment options discussed with the patient including all alternatives, risks, complications.   -Discussed daily foot inspection. -Calluses debrided 6 without complications or bleeding. -Nails debrided 10 without complications or bleeding.   -Follow-up in 3 months. -Patient encouraged to call the office with any questions, concerns, change in symptoms.   Celesta Gentile, DPM

## 2017-07-01 ENCOUNTER — Encounter: Payer: Self-pay | Admitting: Family Medicine

## 2017-07-01 ENCOUNTER — Ambulatory Visit (INDEPENDENT_AMBULATORY_CARE_PROVIDER_SITE_OTHER): Payer: Medicare HMO | Admitting: Family Medicine

## 2017-07-01 VITALS — BP 118/68 | HR 92 | Temp 97.7°F | Ht 64.0 in | Wt 152.6 lb

## 2017-07-01 DIAGNOSIS — E1165 Type 2 diabetes mellitus with hyperglycemia: Secondary | ICD-10-CM

## 2017-07-01 DIAGNOSIS — E785 Hyperlipidemia, unspecified: Secondary | ICD-10-CM | POA: Diagnosis not present

## 2017-07-01 DIAGNOSIS — E1151 Type 2 diabetes mellitus with diabetic peripheral angiopathy without gangrene: Secondary | ICD-10-CM | POA: Diagnosis not present

## 2017-07-01 DIAGNOSIS — IMO0002 Reserved for concepts with insufficient information to code with codable children: Secondary | ICD-10-CM

## 2017-07-01 MED ORDER — LANCETS MISC. MISC: 3 refills | Status: DC

## 2017-07-01 NOTE — Patient Instructions (Signed)

## 2017-07-01 NOTE — Progress Notes (Signed)
Patient ID: Michelle Long, female    DOB: 03-31-1946  Age: 71 y.o. MRN: [id number]    Subjective:  Subjective  HPI ALLEEN KEHM presents for f/u lipids.  She is taking crestor 5 mg 3x a week and doing well.  No other complaints. Review of Systems  Constitutional: Negative for activity change, appetite change, fatigue and unexpected weight change.  Respiratory: Negative for cough and shortness of breath.   Cardiovascular: Negative for chest pain and palpitations.  Psychiatric/Behavioral: Negative for behavioral problems and dysphoric mood. The patient is not nervous/anxious.     History Past Medical History:  Diagnosis Date  . Asthma   . Bipolar disorder ([place])   . Bladder cancer ([place])   . Breast cancer ([place])   . Cancer of lower-inner quadrant of left female breast ([place]) 12/08/2013  . Depression   . Diabetes mellitus   . GERD (gastroesophageal reflux disease)   . Hepatitis B infection   . History of transfusion of whole blood    Approximately 2009, due to breast cancer/chemo.  Marland Kitchen Hyperlipidemia     She has a past surgical history that includes Tonsillectomy (1951); Appendectomy (1961); Partial hysterectomy (1991); bladder cancer (2006); Abdominal hysterectomy; and Breast lumpectomy (Left, 2007).   Her family history includes Arthritis in her mother; Breast cancer in her mother; Cancer in her father; Diabetes in her father; Heart disease in her father; Hypertension in her mother; Throat cancer in her father; Transient ischemic attack in her mother.She reports that she quit smoking about 18 years ago. Her smoking use included Cigarettes. She started smoking about 52 years ago. She has a 49.50 pack-year smoking history. She has never used smokeless tobacco. She reports that she drinks alcohol. She reports that she does not use drugs.  Current Outpatient Prescriptions on File Prior to Visit  Medication Sig Dispense Refill  . albuterol (PROVENTIL HFA) 108 (90 Base) MCG/ACT inhaler Inhale 2  puffs into the lungs every 6 (six) hours as needed for wheezing or shortness of breath. 3 Inhaler 1  . aspirin 81 MG tablet Take 81 mg by mouth daily.    Marland Kitchen b complex vitamins tablet Take 1 tablet by mouth daily.    . Blood Glucose Monitoring Suppl (TRUE METRIX AIR GLUCOSE METER) DEVI     . Calcium Carb-Cholecalciferol (CALCIUM + D3 PO) Take by mouth.    . Cholecalciferol (VITAMIN D) 2000 units CAPS Take 1 capsule by mouth daily.    . citalopram (CELEXA) 10 MG tablet Take 0.5 tablets (5 mg total) by mouth at bedtime. Taking 5 mg. Only. 90 tablet 1  . ezetimibe (ZETIA) 10 MG tablet TAKE 1 TABLET EVERY DAY 90 tablet 1  . fish oil-omega-3 fatty acids 1000 MG capsule Take 1 g by mouth daily.      Marland Kitchen LORazepam (ATIVAN) 0.5 MG tablet Take 1 tablet (0.5 mg total) by mouth 2 (two) times daily as needed. 90 tablet 0  . losartan (COZAAR) 50 MG tablet TAKE 1 TABLET EVERY DAY 90 tablet 1  . metFORMIN (GLUCOPHAGE) 1000 MG tablet TAKE 1 TABLET TWICE DAILY WITH MEALS 180 tablet 1  . OLANZapine (ZYPREXA) 5 MG tablet Take 5 mg by mouth daily.    . pantoprazole (PROTONIX) 40 MG tablet TAKE 1 TABLET EVERY DAY 90 tablet 0  . Spacer/Aero Chamber Mouthpiece MISC To use with inhaler 1 each 1  . SYMBICORT 160-4.5 MCG/ACT inhaler INHALE 2 PUFFS TWICE DAILY 3 Inhaler 1  . TRUE METRIX BLOOD GLUCOSE TEST  test strip CHECK YOUR BLOOD SUGAR ONE TIME DAILY 100 each 3  . rosuvastatin (CRESTOR) 5 MG tablet Take 1 tablet (5 mg total) by mouth once a week. Weekly for 3 weeks; then three(3) times a week thereafter 16 tablet 3   No current facility-administered medications on file prior to visit.      Objective:  Objective  Physical Exam  Constitutional: She is oriented to person, place, and time. She appears well-developed and well-nourished.  HENT:  Head: Normocephalic and atraumatic.  Eyes: Conjunctivae and EOM are normal.  Neck: Normal range of motion. Neck supple. No JVD present. Carotid bruit is not present. No  thyromegaly present.  Cardiovascular: Normal rate, regular rhythm and normal heart sounds.   No murmur heard. Pulmonary/Chest: Effort normal and breath sounds normal. No respiratory distress. She has no wheezes. She has no rales. She exhibits no tenderness.  Musculoskeletal: She exhibits no edema.  Neurological: She is alert and oriented to person, place, and time.  Psychiatric: She has a normal mood and affect.  Nursing note and vitals reviewed. Sensory exam of the foot is normal, tested with the monofilament. Good pulses, no lesions or ulcers, good peripheral pulses. BP 118/68 (BP Location: Right Arm, Patient Position: Sitting, Cuff Size: Normal)   Pulse 92   Temp 97.7 F (36.5 C) (Oral)   Ht 5\' 4"  (1.626 m)   Wt 152 lb 9.6 oz (69.2 kg)   SpO2 95%   BMI 26.19 kg/m  Wt Readings from Last 3 Encounters:  07/01/17 152 lb 9.6 oz (69.2 kg)  04/01/17 156 lb 6.4 oz (70.9 kg)  03/03/17 154 lb 3.2 oz (69.9 kg)     Lab Results  Component Value Date   WBC 6.4 03/03/2017   HGB 14.6 03/03/2017   HCT 44.1 03/03/2017   PLT 188.0 03/03/2017   GLUCOSE 101 (H) 07/01/2017   CHOL 137 07/01/2017   TRIG 84.0 07/01/2017   HDL 56.60 07/01/2017   LDLDIRECT 145.6 06/22/2013   LDLCALC 63 07/01/2017   ALT 47 (H) 07/01/2017   AST 46 (H) 07/01/2017   NA 142 07/01/2017   K 4.6 07/01/2017   CL 105 07/01/2017   CREATININE 0.67 07/01/2017   BUN 12 07/01/2017   CO2 27 07/01/2017   TSH 0.80 01/30/2015   INR 0.97 09/25/2011   HGBA1C 6.5 07/01/2017   MICROALBUR 3.8 (H) 03/03/2017    Mm Screening Breast Tomo Bilateral  Result Date: 06/16/2017 CLINICAL DATA:  Screening. EXAM: 2D DIGITAL SCREENING BILATERAL MAMMOGRAM WITH CAD AND ADJUNCT TOMO COMPARISON:  Previous exam(s). ACR Breast Density Category c: The breast tissue is heterogeneously dense, which may obscure small masses. FINDINGS: There are no findings suspicious for malignancy. Images were processed with CAD. IMPRESSION: No mammographic evidence  of malignancy. A result letter of this screening mammogram will be mailed directly to the patient. RECOMMENDATION: Screening mammogram in one year. (Code:SM-B-01Y) BI-RADS CATEGORY  1: Negative. Electronically Signed   By: Trude Mcburney M.D.   On: 06/16/2017 14:36     Assessment & Plan:  Plan  I am having Ms. Madrazo start on Lancets Misc.. I am also having her maintain her fish oil-omega-3 fatty acids, aspirin, OLANZapine, TRUE METRIX AIR GLUCOSE METER, Calcium Carb-Cholecalciferol (CALCIUM + D3 PO), Vitamin D, albuterol, b complex vitamins, LORazepam, citalopram, losartan, Spacer/Aero Chamber Mouthpiece, TRUE METRIX BLOOD GLUCOSE TEST, rosuvastatin, pantoprazole, ezetimibe, SYMBICORT, and metFORMIN.  Meds ordered this encounter  Medications  . Lancets Misc. MISC    Sig: True metrix lancets  Use as directed    Dispense:  100 each    Refill:  3    Problem List Items Addressed This Visit      Unprioritized   Hyperlipidemia LDL goal <70    On crestor 5 mg 3x a week  Recheck labs today       Other Visit Diagnoses    Hyperlipidemia LDL goal <100    -  Primary   Relevant Orders   Lipid panel (Completed)   Comprehensive metabolic panel (Completed)   DM (diabetes mellitus) type II uncontrolled, periph vascular disorder (HCC)       Relevant Medications   Lancets Misc. MISC   Other Relevant Orders   Hemoglobin A1c (Completed)      Follow-up: Return in about 6 months (around 12/29/2017), or if symptoms worsen or fail to improve, for hyperlipidemia, diabetes II, fasting, annual exam.  Ann Held, DO

## 2017-07-02 ENCOUNTER — Other Ambulatory Visit (HOSPITAL_BASED_OUTPATIENT_CLINIC_OR_DEPARTMENT_OTHER): Payer: Medicare HMO

## 2017-07-02 ENCOUNTER — Ambulatory Visit (HOSPITAL_BASED_OUTPATIENT_CLINIC_OR_DEPARTMENT_OTHER): Payer: Medicare HMO | Admitting: Hematology & Oncology

## 2017-07-02 VITALS — BP 126/75 | HR 93 | Temp 98.2°F | Resp 18 | Wt 153.1 lb

## 2017-07-02 DIAGNOSIS — C50312 Malignant neoplasm of lower-inner quadrant of left female breast: Secondary | ICD-10-CM | POA: Diagnosis not present

## 2017-07-02 DIAGNOSIS — E559 Vitamin D deficiency, unspecified: Secondary | ICD-10-CM

## 2017-07-02 DIAGNOSIS — Z170 Estrogen receptor positive status [ER+]: Secondary | ICD-10-CM

## 2017-07-02 DIAGNOSIS — Z853 Personal history of malignant neoplasm of breast: Secondary | ICD-10-CM | POA: Diagnosis not present

## 2017-07-02 LAB — CBC WITH DIFFERENTIAL (CANCER CENTER ONLY)
BASO#: 0 10*3/uL (ref 0.0–0.2)
BASO%: 0.6 % (ref 0.0–2.0)
EOS%: 2 % (ref 0.0–7.0)
Eosinophils Absolute: 0.1 10*3/uL (ref 0.0–0.5)
HCT: 41.9 % (ref 34.8–46.6)
HGB: 13.9 g/dL (ref 11.6–15.9)
LYMPH#: 2.1 10*3/uL (ref 0.9–3.3)
LYMPH%: 29.7 % (ref 14.0–48.0)
MCH: 31.2 pg (ref 26.0–34.0)
MCHC: 33.2 g/dL (ref 32.0–36.0)
MCV: 94 fL (ref 81–101)
MONO#: 0.6 10*3/uL (ref 0.1–0.9)
MONO%: 8.3 % (ref 0.0–13.0)
NEUT#: 4.2 10*3/uL (ref 1.5–6.5)
NEUT%: 59.4 % (ref 39.6–80.0)
Platelets: 180 10*3/uL (ref 145–400)
RBC: 4.46 10*6/uL (ref 3.70–5.32)
RDW: 12.8 % (ref 11.1–15.7)
WBC: 7 10*3/uL (ref 3.9–10.0)

## 2017-07-02 LAB — COMPREHENSIVE METABOLIC PANEL
ALT: 47 U/L — ABNORMAL HIGH (ref 0–35)
AST: 46 U/L — ABNORMAL HIGH (ref 0–37)
Albumin: 4.4 g/dL (ref 3.5–5.2)
Alkaline Phosphatase: 35 U/L — ABNORMAL LOW (ref 39–117)
BUN: 12 mg/dL (ref 6–23)
CO2: 27 mEq/L (ref 19–32)
Calcium: 9.6 mg/dL (ref 8.4–10.5)
Chloride: 105 mEq/L (ref 96–112)
Creatinine, Ser: 0.67 mg/dL (ref 0.40–1.20)
GFR: 92.16 mL/min (ref >60.00)
Glucose, Bld: 101 mg/dL — ABNORMAL HIGH (ref 70–99)
Potassium: 4.6 mEq/L (ref 3.5–5.1)
Sodium: 142 mEq/L (ref 135–145)
Total Bilirubin: 0.4 mg/dL (ref 0.2–1.2)
Total Protein: 6.9 g/dL (ref 6.0–8.3)

## 2017-07-02 LAB — COMPREHENSIVE METABOLIC PANEL (CC13)
ALT: 58 IU/L — ABNORMAL HIGH (ref 0–32)
AST (SGOT): 49 IU/L — ABNORMAL HIGH (ref 0–40)
Albumin, Serum: 4.6 g/dL (ref 3.5–4.8)
Albumin/Globulin Ratio: 1.6 (ref 1.2–2.2)
Alkaline Phosphatase, S: 47 IU/L (ref 39–117)
BUN/Creatinine Ratio: 15 (ref 12–28)
BUN: 10 mg/dL (ref 8–27)
Bilirubin Total: 0.3 mg/dL (ref 0.0–1.2)
Calcium, Ser: 10.3 mg/dL (ref 8.7–10.3)
Carbon Dioxide, Total: 28 mmol/L (ref 20–29)
Chloride, Ser: 103 mmol/L (ref 96–106)
Creatinine, Ser: 0.67 mg/dL (ref 0.57–1.00)
GFR calc Af Amer: 102 mL/min/{1.73_m2} (ref >59)
GFR calc non Af Amer: 89 mL/min/{1.73_m2} (ref >59)
Globulin, Total: 2.8 g/dL (ref 1.5–4.5)
Glucose: 83 mg/dL (ref 65–99)
Potassium, Ser: 4.6 mmol/L (ref 3.5–5.2)
Sodium: 139 mmol/L (ref 134–144)
Total Protein: 7.4 g/dL (ref 6.0–8.5)

## 2017-07-02 LAB — LIPID PANEL
Cholesterol: 137 mg/dL (ref 0–200)
HDL: 56.6 mg/dL (ref >39.00)
LDL Cholesterol: 63 mg/dL (ref 0–99)
NonHDL: 80.05
Total CHOL/HDL Ratio: 2
Triglycerides: 84 mg/dL (ref 0.0–149.0)
VLDL: 16.8 mg/dL (ref 0.0–40.0)

## 2017-07-02 LAB — HEMOGLOBIN A1C: Hgb A1c MFr Bld: 6.5 % (ref 4.6–6.5)

## 2017-07-02 NOTE — Assessment & Plan Note (Signed)
On crestor 5 mg 3x a week  Recheck labs today

## 2017-07-02 NOTE — Progress Notes (Signed)
Hematology and Oncology Follow Up Visit  Michelle Long [id number] 1946/04/22 71 y.o. 07/02/2017   Principle Diagnosis:  Stage IIA (T2 N0M0) infiltrating ductal carcinoma of the left breast  Current Therapy:   Tamoxifen 20 mg by mouth daily - finished in September 2017    Interim History:  Michelle Long is here for follow-up. We see her every 6 months. She is doing well. She's had a nice summer. She has been on a tour of colleges with her daughter and grandson.  She has done well with her health. She's had no problems with nausea or vomiting. She's had no fatigue. She is exercising.  She is eating well. She's had no cough. She's had no change in bowel or bladder habits.   Her last mammogram was a month ago. This looked fine.   She's had no rashes. She's had no leg swelling.   There's been no change in medications.   Overall, her performance status is ECOG 1.    Medications:  Allergies as of 07/02/2017      Reactions   Lamictal [lamotrigine]    itch   Prozac [fluoxetine Hcl]    seizure   Statins Other (See Comments)   Muscle pains   Sulfa Drugs Cross Reactors    Dyspnea      Medication List       Accurate as of 07/02/17  3:36 PM. Always use your most recent med list.          albuterol 108 (90 Base) MCG/ACT inhaler Commonly known as:  PROVENTIL HFA Inhale 2 puffs into the lungs every 6 (six) hours as needed for wheezing or shortness of breath.   aspirin 81 MG tablet Take 81 mg by mouth daily.   b complex vitamins tablet Take 1 tablet by mouth daily.   CALCIUM + D3 PO Take by mouth.   citalopram 10 MG tablet Commonly known as:  CELEXA Take 0.5 tablets (5 mg total) by mouth at bedtime. Taking 5 mg. Only.   ezetimibe 10 MG tablet Commonly known as:  ZETIA TAKE 1 TABLET EVERY DAY   fish oil-omega-3 fatty acids 1000 MG capsule Take 1 g by mouth daily.   Lancets Misc. Misc True metrix lancets  Use as directed   LORazepam 0.5 MG tablet Commonly known as:   ATIVAN Take 1 tablet (0.5 mg total) by mouth 2 (two) times daily as needed.   losartan 50 MG tablet Commonly known as:  COZAAR TAKE 1 TABLET EVERY DAY   metFORMIN 1000 MG tablet Commonly known as:  GLUCOPHAGE TAKE 1 TABLET TWICE DAILY WITH MEALS   OLANZapine 5 MG tablet Commonly known as:  ZYPREXA Take 5 mg by mouth daily.   pantoprazole 40 MG tablet Commonly known as:  PROTONIX TAKE 1 TABLET EVERY DAY   rosuvastatin 5 MG tablet Commonly known as:  CRESTOR Take 1 tablet (5 mg total) by mouth once a week. Weekly for 3 weeks; then three(3) times a week thereafter   Spacer/Aero Chamber Mouthpiece Misc To use with inhaler   SYMBICORT 160-4.5 MCG/ACT inhaler Generic drug:  budesonide-formoterol INHALE 2 PUFFS TWICE DAILY   TRUE METRIX AIR GLUCOSE METER Devi   TRUE METRIX BLOOD GLUCOSE TEST test strip Generic drug:  glucose blood CHECK YOUR BLOOD SUGAR ONE TIME DAILY   Vitamin D 2000 units Caps Take 1 capsule by mouth daily.       Allergies:  Allergies  Allergen Reactions  . Lamictal [Lamotrigine]     itch  .  Prozac [Fluoxetine Hcl]     seizure  . Statins Other (See Comments)    Muscle pains  . Sulfa Drugs Cross Reactors     Dyspnea     Past Medical History, Surgical history, Social history, and Family History were reviewed and updated.  Review of Systems: All other 10 point review of systems is negative.   Physical Exam:  weight is 153 lb 1.9 oz (69.5 kg). Her oral temperature is 98.2 F (36.8 C). Her blood pressure is 126/75 and her pulse is 93. Her respiration is 18 and oxygen saturation is 96%.   Wt Readings from Last 3 Encounters:  07/02/17 153 lb 1.9 oz (69.5 kg)  07/01/17 152 lb 9.6 oz (69.2 kg)  04/01/17 156 lb 6.4 oz (70.9 kg)    Physical Exam  Constitutional: She is oriented to person, place, and time.  Her breast exam shows her right breast with no masses, edema or erythema. There is no right axillary adenopathy. Left breast is somewhat  contracted from surgery and radiation. She has a well-healed lumpectomy scar at the 6:00 position. There is some slight firmness at the lumpectomy site. There is no distinct mass. There is no left axillary adenopathy.  HENT:  Head: Normocephalic and atraumatic.  Mouth/Throat: Oropharynx is clear and moist.  Eyes: Pupils are equal, round, and reactive to light. EOM are normal.  Neck: Normal range of motion.  Cardiovascular: Normal rate, regular rhythm and normal heart sounds.   Pulmonary/Chest: Effort normal and breath sounds normal.  Abdominal: Soft. Bowel sounds are normal.  Musculoskeletal: Normal range of motion. She exhibits no edema, tenderness or deformity.  Lymphadenopathy:    She has no cervical adenopathy.  Neurological: She is alert and oriented to person, place, and time.  Skin: Skin is warm and dry. No rash noted. No erythema.  Psychiatric: She has a normal mood and affect. Her behavior is normal. Judgment and thought content normal.  Vitals reviewed.  athy.   Lab Results  Component Value Date   WBC 7.0 07/02/2017   HGB 13.9 07/02/2017   HCT 41.9 07/02/2017   MCV 94 07/02/2017   PLT 180 07/02/2017   No results found for: FERRITIN, IRON, TIBC, UIBC, IRONPCTSAT Lab Results  Component Value Date   RBC 4.46 07/02/2017   No results found for: KPAFRELGTCHN, LAMBDASER, KAPLAMBRATIO No results found for: IGGSERUM, IGA, IGMSERUM No results found for: Odetta Pink, SPEI   Chemistry      Component Value Date/Time   NA 142 07/01/2017 1537   NA 142 01/01/2017 1059   K 4.6 07/01/2017 1537   K 4.6 01/01/2017 1059   CL 105 07/01/2017 1537   CL 101 01/01/2016 1503   CL 105 01/20/2015 1240   CO2 27 07/01/2017 1537   CO2 26 01/01/2017 1059   BUN 12 07/01/2017 1537   BUN 10.6 01/01/2017 1059   CREATININE 0.67 07/01/2017 1537   CREATININE 0.8 01/01/2017 1059      Component Value Date/Time   CALCIUM 9.6 07/01/2017 1537     CALCIUM 10.1 01/01/2017 1059   ALKPHOS 35 (L) 07/01/2017 1537   ALKPHOS 49 01/01/2017 1059   AST 46 (H) 07/01/2017 1537   AST 60 (H) 01/01/2017 1059   ALT 47 (H) 07/01/2017 1537   ALT 63 (H) 01/01/2017 1059   BILITOT 0.4 07/01/2017 1537   BILITOT 0.64 01/01/2017 1059     Impression and Plan: Michelle Long is aMost delightful 71 year old postmenopausal white  female. She had stage IIA ductal carcinoma the left breast. She completed tamoxifen in September 2017.  I do not see any evidence of recurrent disease.  We will continue to follow her along every 6 months.  She is triple positive so she is at risk for late recurrence. As such, we will continue follow her along every 6 months.      Volanda Napoleon, MD 9/12/20183:36 PM

## 2017-07-03 LAB — VITAMIN D 25 HYDROXY (VIT D DEFICIENCY, FRACTURES): Vitamin D, 25-Hydroxy: 55.7 ng/mL (ref 30.0–100.0)

## 2017-07-08 DIAGNOSIS — F250 Schizoaffective disorder, bipolar type: Secondary | ICD-10-CM | POA: Diagnosis not present

## 2017-07-11 ENCOUNTER — Other Ambulatory Visit: Payer: Self-pay | Admitting: Family Medicine

## 2017-07-17 ENCOUNTER — Encounter: Payer: Self-pay | Admitting: Hematology & Oncology

## 2017-07-17 NOTE — Progress Notes (Unsigned)
Faxed medical records to: [employer] P: [phone] Req ID: [id number] [place]: 10/22/2015-Present

## 2017-07-21 ENCOUNTER — Other Ambulatory Visit: Payer: Self-pay | Admitting: Family Medicine

## 2017-07-21 DIAGNOSIS — K219 Gastro-esophageal reflux disease without esophagitis: Secondary | ICD-10-CM

## 2017-08-04 ENCOUNTER — Encounter: Payer: Self-pay | Admitting: Family Medicine

## 2017-08-04 DIAGNOSIS — E109 Type 1 diabetes mellitus without complications: Secondary | ICD-10-CM | POA: Diagnosis not present

## 2017-08-04 DIAGNOSIS — I10 Essential (primary) hypertension: Secondary | ICD-10-CM | POA: Diagnosis not present

## 2017-08-04 DIAGNOSIS — H5210 Myopia, unspecified eye: Secondary | ICD-10-CM | POA: Diagnosis not present

## 2017-08-04 DIAGNOSIS — Z0100 Encounter for examination of eyes and vision without abnormal findings: Secondary | ICD-10-CM | POA: Diagnosis not present

## 2017-08-04 DIAGNOSIS — H2510 Age-related nuclear cataract, unspecified eye: Secondary | ICD-10-CM | POA: Diagnosis not present

## 2017-08-04 DIAGNOSIS — E7800 Pure hypercholesterolemia, unspecified: Secondary | ICD-10-CM | POA: Diagnosis not present

## 2017-08-04 LAB — HM DIABETES EYE EXAM

## 2017-08-05 ENCOUNTER — Telehealth: Payer: Self-pay | Admitting: *Deleted

## 2017-08-05 NOTE — Telephone Encounter (Signed)
Received results from [facility]; forwarded to provider/SLS 10/16

## 2017-08-06 ENCOUNTER — Other Ambulatory Visit: Payer: Self-pay | Admitting: Family Medicine

## 2017-08-06 DIAGNOSIS — I10 Essential (primary) hypertension: Secondary | ICD-10-CM

## 2017-08-27 DIAGNOSIS — C678 Malignant neoplasm of overlapping sites of bladder: Secondary | ICD-10-CM | POA: Diagnosis not present

## 2017-09-04 ENCOUNTER — Ambulatory Visit: Payer: Medicare HMO | Admitting: Family Medicine

## 2017-09-09 ENCOUNTER — Other Ambulatory Visit: Payer: Self-pay | Admitting: Family Medicine

## 2017-09-09 ENCOUNTER — Other Ambulatory Visit: Payer: Self-pay | Admitting: Student

## 2017-09-09 NOTE — Telephone Encounter (Signed)
Rx refill.   See attached request

## 2017-09-09 NOTE — Telephone Encounter (Signed)
Copied from [place] [id number]. Topic: Quick Communication - See Telephone Encounter >> Sep 09, 2017 12:54 PM Conception Chancy, NT wrote: CRM for notification. See Telephone encounter for:  09/09/17.  Vicky with mail order is calling because pt is completely out of medication and is wanting a 30 day supply sent to the [facility] on file today and then a 90 day supply sent to mail order.   Please contact pt when RX is sent over as she is currently completely out of medication

## 2017-09-23 ENCOUNTER — Encounter: Payer: Self-pay | Admitting: Podiatry

## 2017-09-23 ENCOUNTER — Ambulatory Visit (INDEPENDENT_AMBULATORY_CARE_PROVIDER_SITE_OTHER): Payer: Medicare HMO | Admitting: Podiatry

## 2017-09-23 DIAGNOSIS — B351 Tinea unguium: Secondary | ICD-10-CM | POA: Diagnosis not present

## 2017-09-23 DIAGNOSIS — Q828 Other specified congenital malformations of skin: Secondary | ICD-10-CM | POA: Diagnosis not present

## 2017-09-23 DIAGNOSIS — E119 Type 2 diabetes mellitus without complications: Secondary | ICD-10-CM | POA: Diagnosis not present

## 2017-09-23 DIAGNOSIS — R748 Abnormal levels of other serum enzymes: Secondary | ICD-10-CM | POA: Diagnosis not present

## 2017-09-23 DIAGNOSIS — M79676 Pain in unspecified toe(s): Secondary | ICD-10-CM | POA: Diagnosis not present

## 2017-09-23 NOTE — Progress Notes (Signed)
Patient ID: Michelle Long, female   DOB: Jul 24, 1946, 70 y.o.   MRN: [id number]  Subjective: 71 year old female presents the office today for diabetic risk assessment and for painful calluses and toenails to both of her feet. She states that the left big toenail is ingrown and only causes some occasional pain at times. No drainage, pus or redness. She states they both get painful as they grow as well as with the calluses. Denies any acute changes. Denies any systemic complaints such as fevers, chills, nausea, vomiting. No acute changes since last appointment.  Objective: AAO x3, NAD DP/PT pulses palpable bilaterally, CRT less than 3 seconds Protective sensation intact with [name] monofilament Hyperkeratotic lesions present bilateral submetatarsal one and 5 and right medial hallux a Upon debridement no underlying 5th digit. No underlying ulceration, drainage or other signs of infection. Nails are hypertrophic, dystrophic, brittle, discolored, elongated 10. Particular bilateral hallux nails and a significant. No areas of pinpoint bony tenderness or pain with vibratory sensation. MMT 5/5, ROM WNL. No edema, erythema, increase in warmth to bilateral lower extremities.  No other open lesions or pre-ulcerative lesions.  No pain with calf compression, swelling, warmth, erythema  Assessment: Symptomatic hyperkeratotic lesions, symptomatic onychomycosis  Plan: -All treatment options discussed with the patient including all alternatives, risks, complications.   -Discussed daily foot inspection. -Calluses debrided 6 without complications or bleeding. Debrided the symptomatic ingrown toenail without any complications or bleeding. Declined partial nail avulsion.  -Nails debrided 10 without complications or bleeding.   -Follow-up in 3 months. -Patient encouraged to call the office with any questions, concerns, change in symptoms.   Celesta Gentile, DPM

## 2017-11-08 ENCOUNTER — Other Ambulatory Visit: Payer: Self-pay | Admitting: Family Medicine

## 2017-12-03 ENCOUNTER — Other Ambulatory Visit: Payer: Self-pay | Admitting: Family Medicine

## 2017-12-09 ENCOUNTER — Other Ambulatory Visit: Payer: Self-pay | Admitting: *Deleted

## 2017-12-09 DIAGNOSIS — E1165 Type 2 diabetes mellitus with hyperglycemia: Secondary | ICD-10-CM

## 2017-12-09 DIAGNOSIS — E1151 Type 2 diabetes mellitus with diabetic peripheral angiopathy without gangrene: Secondary | ICD-10-CM

## 2017-12-09 DIAGNOSIS — IMO0002 Reserved for concepts with insufficient information to code with codable children: Secondary | ICD-10-CM

## 2017-12-09 MED ORDER — LANCETS MISC. MISC: 1 refills | Status: DC

## 2017-12-10 ENCOUNTER — Other Ambulatory Visit: Payer: Medicare HMO

## 2017-12-11 ENCOUNTER — Other Ambulatory Visit (INDEPENDENT_AMBULATORY_CARE_PROVIDER_SITE_OTHER): Payer: Medicare HMO

## 2017-12-11 ENCOUNTER — Other Ambulatory Visit: Payer: Self-pay | Admitting: Emergency Medicine

## 2017-12-11 DIAGNOSIS — E1151 Type 2 diabetes mellitus with diabetic peripheral angiopathy without gangrene: Secondary | ICD-10-CM | POA: Diagnosis not present

## 2017-12-11 DIAGNOSIS — E785 Hyperlipidemia, unspecified: Secondary | ICD-10-CM

## 2017-12-11 LAB — COMPREHENSIVE METABOLIC PANEL
ALT: 35 U/L (ref 0–35)
AST: 28 U/L (ref 0–37)
Albumin: 4.2 g/dL (ref 3.5–5.2)
Alkaline Phosphatase: 40 U/L (ref 39–117)
BUN: 13 mg/dL (ref 6–23)
CO2: 31 mEq/L (ref 19–32)
Calcium: 9.8 mg/dL (ref 8.4–10.5)
Chloride: 101 mEq/L (ref 96–112)
Creatinine, Ser: 0.77 mg/dL (ref 0.40–1.20)
GFR: 78.39 mL/min (ref >60.00)
Glucose, Bld: 127 mg/dL — ABNORMAL HIGH (ref 70–99)
Potassium: 4.2 mEq/L (ref 3.5–5.1)
Sodium: 139 mEq/L (ref 135–145)
Total Bilirubin: 0.5 mg/dL (ref 0.2–1.2)
Total Protein: 7.2 g/dL (ref 6.0–8.3)

## 2017-12-11 LAB — LIPID PANEL
Cholesterol: 131 mg/dL (ref 0–200)
HDL: 46.9 mg/dL (ref >39.00)
LDL Cholesterol: 63 mg/dL (ref 0–99)
NonHDL: 83.97
Total CHOL/HDL Ratio: 3
Triglycerides: 104 mg/dL (ref 0.0–149.0)
VLDL: 20.8 mg/dL (ref 0.0–40.0)

## 2017-12-11 LAB — HEMOGLOBIN A1C: Hgb A1c MFr Bld: 6.7 % — ABNORMAL HIGH (ref 4.6–6.5)

## 2017-12-31 ENCOUNTER — Encounter: Payer: Self-pay | Admitting: Hematology & Oncology

## 2017-12-31 ENCOUNTER — Inpatient Hospital Stay (HOSPITAL_BASED_OUTPATIENT_CLINIC_OR_DEPARTMENT_OTHER): Payer: Medicare HMO | Admitting: Hematology & Oncology

## 2017-12-31 ENCOUNTER — Other Ambulatory Visit: Payer: Self-pay

## 2017-12-31 ENCOUNTER — Inpatient Hospital Stay: Payer: Medicare HMO | Attending: Hematology & Oncology

## 2017-12-31 VITALS — BP 137/77 | HR 98 | Temp 98.0°F | Resp 17 | Wt 155.8 lb

## 2017-12-31 DIAGNOSIS — Z853 Personal history of malignant neoplasm of breast: Secondary | ICD-10-CM

## 2017-12-31 DIAGNOSIS — E559 Vitamin D deficiency, unspecified: Secondary | ICD-10-CM

## 2017-12-31 DIAGNOSIS — Z170 Estrogen receptor positive status [ER+]: Secondary | ICD-10-CM | POA: Diagnosis not present

## 2017-12-31 DIAGNOSIS — R7989 Other specified abnormal findings of blood chemistry: Secondary | ICD-10-CM

## 2017-12-31 DIAGNOSIS — Z7984 Long term (current) use of oral hypoglycemic drugs: Secondary | ICD-10-CM | POA: Insufficient documentation

## 2017-12-31 DIAGNOSIS — Z7982 Long term (current) use of aspirin: Secondary | ICD-10-CM | POA: Insufficient documentation

## 2017-12-31 DIAGNOSIS — C50312 Malignant neoplasm of lower-inner quadrant of left female breast: Secondary | ICD-10-CM

## 2017-12-31 DIAGNOSIS — Z9223 Personal history of estrogen therapy: Secondary | ICD-10-CM | POA: Diagnosis not present

## 2017-12-31 DIAGNOSIS — Z79899 Other long term (current) drug therapy: Secondary | ICD-10-CM

## 2017-12-31 LAB — CBC WITH DIFFERENTIAL (CANCER CENTER ONLY)
Basophils Absolute: 0 10*3/uL (ref 0.0–0.1)
Basophils Relative: 0 %
Eosinophils Absolute: 0.2 10*3/uL (ref 0.0–0.5)
Eosinophils Relative: 2 %
HCT: 43.9 % (ref 34.8–46.6)
Hemoglobin: 14.2 g/dL (ref 11.6–15.9)
Lymphocytes Relative: 24 %
Lymphs Abs: 1.8 10*3/uL (ref 0.9–3.3)
MCH: 29.8 pg (ref 26.0–34.0)
MCHC: 32.3 g/dL (ref 32.0–36.0)
MCV: 92.2 fL (ref 81.0–101.0)
Monocytes Absolute: 0.6 10*3/uL (ref 0.1–0.9)
Monocytes Relative: 8 %
Neutro Abs: 4.9 10*3/uL (ref 1.5–6.5)
Neutrophils Relative %: 66 %
Platelet Count: 180 10*3/uL (ref 145–400)
RBC: 4.76 MIL/uL (ref 3.70–5.32)
RDW: 13.6 % (ref 11.1–15.7)
WBC Count: 7.5 10*3/uL (ref 3.9–10.0)

## 2017-12-31 LAB — CMP (CANCER CENTER ONLY)
ALT: 56 U/L — ABNORMAL HIGH (ref 10–47)
AST: 47 U/L — ABNORMAL HIGH (ref 11–38)
Albumin: 4 g/dL (ref 3.5–5.0)
Alkaline Phosphatase: 56 U/L (ref 26–84)
Anion gap: 11 (ref 5–15)
BUN: 9 mg/dL (ref 7–22)
CO2: 29 mmol/L (ref 18–33)
Calcium: 9.4 mg/dL (ref 8.0–10.3)
Chloride: 105 mmol/L (ref 98–108)
Creatinine: 0.9 mg/dL (ref 0.60–1.20)
Glucose, Bld: 160 mg/dL — ABNORMAL HIGH (ref 73–118)
Potassium: 4.4 mmol/L (ref 3.3–4.7)
Sodium: 145 mmol/L (ref 128–145)
Total Bilirubin: 0.6 mg/dL (ref 0.2–1.6)
Total Protein: 7.3 g/dL (ref 6.4–8.1)

## 2017-12-31 NOTE — Progress Notes (Signed)
Hematology and Oncology Follow Up Visit  Michelle Long [id number] 1946-05-30 72 y.o. 12/31/2017   Principle Diagnosis:  Stage IIA (T2 N0M0) infiltrating ductal carcinoma of the left breast  Current Therapy:   Tamoxifen 20 mg by mouth daily - finished in September 2017    Interim History:  Michelle Long is here for follow-up. We see her every 6 months. She is doing well.  She had no problems over the winter.  She had a good time over the holidays.  She has had no problems with infections.  There is been no fever.  She is had no change in bowel or bladder habits.  There is been no bleeding.  She is had no rashes.  There is been no leg swelling.  Overall, I would say that her performance status is ECOG 0.  She has had some slightly elevated liver function test.  Her gastroenterologist is looking into this.  Medications:  Allergies as of 12/31/2017      Reactions   Lamictal [lamotrigine]    itch   Prozac [fluoxetine Hcl]    seizure   Statins Other (See Comments)   Muscle pains   Sulfa Drugs Cross Reactors    Dyspnea      Medication List        Accurate as of 12/31/17  3:34 PM. Always use your most recent med list.          albuterol 108 (90 Base) MCG/ACT inhaler Commonly known as:  PROVENTIL HFA Inhale 2 puffs into the lungs every 6 (six) hours as needed for wheezing or shortness of breath.   VENTOLIN HFA 108 (90 Base) MCG/ACT inhaler Generic drug:  albuterol INHALE TWO PUFFS INTO LUNGS EVERY 6 HOURS AS NEEDED FOR WHEEZING OR SHORTNESS OF BREATH   aspirin 81 MG tablet Take 81 mg by mouth daily.   b complex vitamins tablet Take 1 tablet by mouth daily.   CALCIUM + D3 PO Take by mouth.   citalopram 10 MG tablet Commonly known as:  CELEXA Take 0.5 tablets (5 mg total) by mouth at bedtime. Taking 5 mg. Only.   ezetimibe 10 MG tablet Commonly known as:  ZETIA TAKE 1 TABLET EVERY DAY   fish oil-omega-3 fatty acids 1000 MG capsule Take 1 g by mouth daily.     Lancets Misc. Misc Trueplus 33g lancets  Use as directed once a day   LORazepam 0.5 MG tablet Commonly known as:  ATIVAN Take 1 tablet (0.5 mg total) by mouth 2 (two) times daily as needed.   losartan 50 MG tablet Commonly known as:  COZAAR TAKE 1 TABLET EVERY DAY   metFORMIN 1000 MG tablet Commonly known as:  GLUCOPHAGE TAKE 1 TABLET TWICE DAILY WITH MEALS   multivitamin tablet Take 1 tablet by mouth daily.   OLANZapine 5 MG tablet Commonly known as:  ZYPREXA Take 5 mg by mouth daily.   pantoprazole 40 MG tablet Commonly known as:  PROTONIX TAKE 1 TABLET EVERY DAY   rosuvastatin 5 MG tablet Commonly known as:  CRESTOR TAKE 1 TABLET BY MOUTH ONCE A WEEK FOR THREE WEEKS. THEN THREE TIMES A WEEK THERAFTER.   Spacer/Aero Chamber [employer] To use with inhaler   SYMBICORT 160-4.5 MCG/ACT inhaler Generic drug:  budesonide-formoterol INHALE 2 PUFFS TWICE DAILY   TRUE METRIX AIR GLUCOSE METER Devi   TRUE METRIX BLOOD GLUCOSE TEST test strip Generic drug:  glucose blood CHECK YOUR BLOOD SUGAR ONE TIME DAILY   Vitamin D 2000  units Caps Take 5,000 Units by mouth daily.       Allergies:  Allergies  Allergen Reactions  . Lamictal [Lamotrigine]     itch  . Prozac [Fluoxetine Hcl]     seizure  . Statins Other (See Comments)    Muscle pains  . Sulfa Drugs Cross Reactors     Dyspnea     Past Medical History, Surgical history, Social history, and Family History were reviewed and updated.  Review of Systems: Review of Systems  Constitutional: Negative.   HENT: Negative.   Eyes: Negative.   Respiratory: Negative.   Cardiovascular: Negative.   Gastrointestinal: Negative.   Genitourinary: Negative.   Musculoskeletal: Negative.   Skin: Negative.   Neurological: Negative.   Endo/Heme/Allergies: Negative.   Psychiatric/Behavioral: Negative.      Physical Exam:  weight is 155 lb 12.8 oz (70.7 kg). Her oral temperature is 98 F (36.7 C). Her blood  pressure is 137/77 and her pulse is 98. Her respiration is 17 and oxygen saturation is 97%.   Wt Readings from Last 3 Encounters:  12/31/17 155 lb 12.8 oz (70.7 kg)  07/02/17 153 lb 1.9 oz (69.5 kg)  07/01/17 152 lb 9.6 oz (69.2 kg)    Physical Exam  Constitutional: She is oriented to person, place, and time.  Her breast exam shows her right breast with no masses, edema or erythema. There is no right axillary adenopathy. Left breast is somewhat contracted from surgery and radiation. She has a well-healed lumpectomy scar at the 6:00 position. There is some slight firmness at the lumpectomy site. There is no distinct mass. There is no left axillary adenopathy.  HENT:  Head: Normocephalic and atraumatic.  Mouth/Throat: Oropharynx is clear and moist.  Eyes: EOM are normal. Pupils are equal, round, and reactive to light.  Neck: Normal range of motion.  Cardiovascular: Normal rate, regular rhythm and normal heart sounds.  Pulmonary/Chest: Effort normal and breath sounds normal.  Abdominal: Soft. Bowel sounds are normal.  Musculoskeletal: Normal range of motion. She exhibits no edema, tenderness or deformity.  Lymphadenopathy:    She has no cervical adenopathy.  Neurological: She is alert and oriented to person, place, and time.  Skin: Skin is warm and dry. No rash noted. No erythema.  Psychiatric: She has a normal mood and affect. Her behavior is normal. Judgment and thought content normal.  Vitals reviewed.  athy.   Lab Results  Component Value Date   WBC 7.5 12/31/2017   HGB 13.9 07/02/2017   HCT 43.9 12/31/2017   MCV 92.2 12/31/2017   PLT 180 12/31/2017   No results found for: FERRITIN, IRON, TIBC, UIBC, IRONPCTSAT Lab Results  Component Value Date   RBC 4.76 12/31/2017   No results found for: KPAFRELGTCHN, LAMBDASER, KAPLAMBRATIO No results found for: IGGSERUM, IGA, IGMSERUM No results found for: Odetta Pink, SPEI    Chemistry      Component Value Date/Time   NA 145 12/31/2017 1424   NA 139 07/02/2017 1445   NA 142 01/01/2017 1059   K 4.4 12/31/2017 1424   K 4.6 07/02/2017 1445   K 4.6 01/01/2017 1059   CL 105 12/31/2017 1424   CL 103 07/02/2017 1445   CL 105 01/20/2015 1240   CO2 29 12/31/2017 1424   CO2 28 07/02/2017 1445   CO2 26 01/01/2017 1059   BUN 9 12/31/2017 1424   BUN 10 07/02/2017 1445   BUN 10.6 01/01/2017 1059   CREATININE  0.90 12/31/2017 1424   CREATININE 0.67 07/02/2017 1445   CREATININE 0.8 01/01/2017 1059      Component Value Date/Time   CALCIUM 9.4 12/31/2017 1424   CALCIUM 10.3 07/02/2017 1445   CALCIUM 10.1 01/01/2017 1059   ALKPHOS 56 12/31/2017 1424   ALKPHOS 47 07/02/2017 1445   ALKPHOS 49 01/01/2017 1059   AST 47 (H) 12/31/2017 1424   AST 60 (H) 01/01/2017 1059   ALT 56 (H) 12/31/2017 1424   ALT 63 (H) 01/01/2017 1059   BILITOT 0.6 12/31/2017 1424   BILITOT 0.64 01/01/2017 1059     Impression and Plan: Michelle Long is a most delightful 72 year old postmenopausal white female. She had stage IIA ductal carcinoma the left breast. She completed tamoxifen in September 2017.  I do not see any evidence of recurrent disease.  We will continue to follow her along every 6 months.  She is triple positive so she is at risk for late recurrence. As such, we will continue follow her along every 6 months.      Volanda Napoleon, MD 3/13/20193:34 PM

## 2018-01-01 ENCOUNTER — Encounter: Payer: Self-pay | Admitting: Family Medicine

## 2018-01-01 ENCOUNTER — Telehealth: Payer: Self-pay | Admitting: *Deleted

## 2018-01-01 ENCOUNTER — Ambulatory Visit (INDEPENDENT_AMBULATORY_CARE_PROVIDER_SITE_OTHER): Payer: Medicare HMO | Admitting: Family Medicine

## 2018-01-01 VITALS — BP 134/71 | HR 98 | Temp 98.6°F | Resp 16 | Ht 64.0 in | Wt 155.6 lb

## 2018-01-01 DIAGNOSIS — I10 Essential (primary) hypertension: Secondary | ICD-10-CM

## 2018-01-01 DIAGNOSIS — E785 Hyperlipidemia, unspecified: Secondary | ICD-10-CM | POA: Diagnosis not present

## 2018-01-01 DIAGNOSIS — E1165 Type 2 diabetes mellitus with hyperglycemia: Secondary | ICD-10-CM | POA: Diagnosis not present

## 2018-01-01 DIAGNOSIS — E1151 Type 2 diabetes mellitus with diabetic peripheral angiopathy without gangrene: Secondary | ICD-10-CM | POA: Diagnosis not present

## 2018-01-01 DIAGNOSIS — IMO0002 Reserved for concepts with insufficient information to code with codable children: Secondary | ICD-10-CM

## 2018-01-01 LAB — LIPID PANEL
Cholesterol: 126 mg/dL (ref 0–200)
HDL: 48.7 mg/dL (ref >39.00)
LDL Cholesterol: 60 mg/dL (ref 0–99)
NonHDL: 77.73
Total CHOL/HDL Ratio: 3
Triglycerides: 91 mg/dL (ref 0.0–149.0)
VLDL: 18.2 mg/dL (ref 0.0–40.0)

## 2018-01-01 LAB — POC URINALSYSI DIPSTICK (AUTOMATED)
Bilirubin, UA: NEGATIVE
Blood, UA: NEGATIVE
Glucose, UA: NEGATIVE
Ketones, UA: NEGATIVE
Leukocytes, UA: NEGATIVE
Nitrite, UA: NEGATIVE
Protein, UA: NEGATIVE
Spec Grav, UA: >=1.03 — AB (ref 1.010–1.025)
Urobilinogen, UA: 0.2 E.U./dL
pH, UA: 6 (ref 5.0–8.0)

## 2018-01-01 LAB — MICROALBUMIN / CREATININE URINE RATIO
Creatinine,U: 163.8 mg/dL
Microalb Creat Ratio: 1.2 mg/g (ref 0.0–30.0)
Microalb, Ur: 1.9 mg/dL (ref 0.0–1.9)

## 2018-01-01 LAB — HEMOGLOBIN A1C: Hgb A1c MFr Bld: 6.7 % — ABNORMAL HIGH (ref 4.6–6.5)

## 2018-01-01 LAB — VITAMIN D 25 HYDROXY (VIT D DEFICIENCY, FRACTURES): Vit D, 25-Hydroxy: 64.3 ng/mL (ref 30.0–100.0)

## 2018-01-01 MED ORDER — METFORMIN HCL 1000 MG PO TABS: 1000.0000 mg | ORAL_TABLET | Freq: Two times a day (BID) | ORAL | 3 refills | Status: DC

## 2018-01-01 NOTE — Patient Instructions (Signed)

## 2018-01-01 NOTE — Assessment & Plan Note (Signed)
hgba1c acceptable, minimize simple carbs. Increase exercise as tolerated. Continue current meds.sb

## 2018-01-01 NOTE — Progress Notes (Signed)
Patient ID: Michelle Long, female    DOB: 08-20-1946  Age: 72 y.o. MRN: [id number]    Subjective:  Subjective  HPI Michelle Long presents for f/u dm, cholesterol   No complaints.   Review of Systems  Constitutional: Negative for activity change, appetite change, diaphoresis, fatigue and unexpected weight change.  Eyes: Negative for pain, redness and visual disturbance.  Respiratory: Negative for cough, chest tightness, shortness of breath and wheezing.   Cardiovascular: Negative for chest pain, palpitations and leg swelling.  Endocrine: Negative for cold intolerance, heat intolerance, polydipsia, polyphagia and polyuria.  Genitourinary: Negative for difficulty urinating, dysuria and frequency.  Neurological: Negative for dizziness, light-headedness, numbness and headaches.  Psychiatric/Behavioral: Negative for behavioral problems and dysphoric mood. The patient is not nervous/anxious.     History Past Medical History:  Diagnosis Date  . Asthma   . Bipolar disorder ([place])   . Bladder cancer ([place])   . Breast cancer ([place])   . Cancer of lower-inner quadrant of left female breast ([place]) 12/08/2013  . Depression   . Diabetes mellitus   . GERD (gastroesophageal reflux disease)   . Hepatitis B infection   . History of transfusion of whole blood    Approximately 2009, due to breast cancer/chemo.  Marland Kitchen Hyperlipidemia     She has a past surgical history that includes Tonsillectomy (1951); Appendectomy (1961); Partial hysterectomy (1991); bladder cancer (2006); Abdominal hysterectomy; and Breast lumpectomy (Left, 2007).   Her family history includes Arthritis in her mother; Breast cancer in her mother; Cancer in her father; Diabetes in her father; Heart disease in her father; Hypertension in her mother; Throat cancer in her father; Transient ischemic attack in her mother.She reports that she quit smoking about 19 years ago. Her smoking use included cigarettes. She started smoking about 53 years ago.  She has a 49.50 pack-year smoking history. she has never used smokeless tobacco. She reports that she drinks alcohol. She reports that she does not use drugs.  Current Outpatient Medications on File Prior to Visit  Medication Sig Dispense Refill  . albuterol (PROVENTIL HFA) 108 (90 Base) MCG/ACT inhaler Inhale 2 puffs into the lungs every 6 (six) hours as needed for wheezing or shortness of breath. 3 Inhaler 1  . aspirin 81 MG tablet Take 81 mg by mouth daily.    Marland Kitchen b complex vitamins tablet Take 1 tablet by mouth daily.    . Blood Glucose Monitoring Suppl (TRUE METRIX AIR GLUCOSE METER) DEVI     . Calcium Carb-Cholecalciferol (CALCIUM + D3 PO) Take by mouth.    . Cholecalciferol (VITAMIN D) 2000 units CAPS Take 5,000 Units by mouth daily.     . citalopram (CELEXA) 10 MG tablet Take 0.5 tablets (5 mg total) by mouth at bedtime. Taking 5 mg. Only. 90 tablet 1  . ezetimibe (ZETIA) 10 MG tablet TAKE 1 TABLET EVERY DAY 90 tablet 1  . fish oil-omega-3 fatty acids 1000 MG capsule Take 1 g by mouth daily.      . Lancets Misc. MISC Trueplus 33g lancets  Use as directed once a day 100 each 1  . LORazepam (ATIVAN) 0.5 MG tablet Take 1 tablet (0.5 mg total) by mouth 2 (two) times daily as needed. 90 tablet 0  . losartan (COZAAR) 50 MG tablet TAKE 1 TABLET EVERY DAY 90 tablet 1  . Multiple Vitamin (MULTIVITAMIN) tablet Take 1 tablet by mouth daily.    Marland Kitchen OLANZapine (ZYPREXA) 5 MG tablet Take 5 mg by mouth daily.    Marland Kitchen  pantoprazole (PROTONIX) 40 MG tablet TAKE 1 TABLET EVERY DAY 90 tablet 1  . rosuvastatin (CRESTOR) 5 MG tablet TAKE 1 TABLET BY MOUTH ONCE A WEEK FOR THREE WEEKS. THEN THREE TIMES A WEEK THERAFTER. 36 tablet 2  . Spacer/Aero Chamber Mouthpiece [employer] To use with inhaler 1 each 1  . SYMBICORT 160-4.5 MCG/ACT inhaler INHALE 2 PUFFS TWICE DAILY 3 Inhaler 1  . TRUE METRIX BLOOD GLUCOSE TEST test strip CHECK YOUR BLOOD SUGAR ONE TIME DAILY 100 each 3  . VENTOLIN HFA 108 (90 Base) MCG/ACT inhaler  INHALE TWO PUFFS INTO LUNGS EVERY 6 HOURS AS NEEDED FOR WHEEZING OR SHORTNESS OF BREATH 54 each 1   No current facility-administered medications on file prior to visit.      Objective:  Objective  Physical Exam  Constitutional: She is oriented to person, place, and time. She appears well-developed and well-nourished.  HENT:  Head: Normocephalic and atraumatic.  Eyes: Conjunctivae and EOM are normal.  Neck: Normal range of motion. Neck supple. No JVD present. Carotid bruit is not present. No thyromegaly present.  Cardiovascular: Normal rate, regular rhythm and normal heart sounds.  No murmur heard. Pulmonary/Chest: Effort normal and breath sounds normal. No respiratory distress. She has no wheezes. She has no rales. She exhibits no tenderness.  Musculoskeletal: She exhibits no edema.  Neurological: She is alert and oriented to person, place, and time.  Psychiatric: She has a normal mood and affect.  Nursing note and vitals reviewed. Sensory exam of the foot is normal, tested with the monofilament. Good pulses, no lesions or ulcers, good peripheral pulses.  BP 134/71 (BP Location: Right Arm, Patient Position: Sitting, Cuff Size: Small)   Pulse 98   Temp 98.6 F (37 C) (Oral)   Resp 16   Ht 5\' 4"  (1.626 m)   Wt 155 lb 9.6 oz (70.6 kg)   SpO2 96%   BMI 26.71 kg/m  Wt Readings from Last 3 Encounters:  01/01/18 155 lb 9.6 oz (70.6 kg)  12/31/17 155 lb 12.8 oz (70.7 kg)  07/02/17 153 lb 1.9 oz (69.5 kg)     Lab Results  Component Value Date   WBC 7.5 12/31/2017   HGB 13.9 07/02/2017   HCT 43.9 12/31/2017   PLT 180 12/31/2017   GLUCOSE 160 (H) 12/31/2017   CHOL 131 12/11/2017   TRIG 104.0 12/11/2017   HDL 46.90 12/11/2017   LDLDIRECT 145.6 06/22/2013   LDLCALC 63 12/11/2017   ALT 56 (H) 12/31/2017   AST 47 (H) 12/31/2017   NA 145 12/31/2017   K 4.4 12/31/2017   CL 105 12/31/2017   CREATININE 0.90 12/31/2017   BUN 9 12/31/2017   CO2 29 12/31/2017   TSH 0.80  01/30/2015   INR 0.97 09/25/2011   HGBA1C 6.7 (H) 12/11/2017   MICROALBUR 3.8 (H) 03/03/2017    Mm Screening Breast Tomo Bilateral  Result Date: 06/16/2017 CLINICAL DATA:  Screening. EXAM: 2D DIGITAL SCREENING BILATERAL MAMMOGRAM WITH CAD AND ADJUNCT TOMO COMPARISON:  Previous exam(s). ACR Breast Density Category c: The breast tissue is heterogeneously dense, which may obscure small masses. FINDINGS: There are no findings suspicious for malignancy. Images were processed with CAD. IMPRESSION: No mammographic evidence of malignancy. A result letter of this screening mammogram will be mailed directly to the patient. RECOMMENDATION: Screening mammogram in one year. (Code:SM-B-01Y) BI-RADS CATEGORY  1: Negative. Electronically Signed   By: Trude Mcburney M.D.   On: 06/16/2017 14:36     Assessment & Plan:  Plan  I have changed Wynona Neat. Bayer's metFORMIN. I am also having her maintain her fish oil-omega-3 fatty acids, aspirin, OLANZapine, TRUE METRIX AIR GLUCOSE METER, Calcium Carb-Cholecalciferol (CALCIUM + D3 PO), Vitamin D, albuterol, b complex vitamins, LORazepam, citalopram, Spacer/Aero Chamber Mouthpiece, TRUE METRIX BLOOD GLUCOSE TEST, SYMBICORT, VENTOLIN HFA, pantoprazole, losartan, rosuvastatin, ezetimibe, Lancets Misc., and multivitamin.  Meds ordered this encounter  Medications  . metFORMIN (GLUCOPHAGE) 1000 MG tablet    Sig: Take 1 tablet (1,000 mg total) by mouth 2 (two) times daily with a meal.    Dispense:  180 tablet    Refill:  3    Problem List Items Addressed This Visit      Unprioritized   DM (diabetes mellitus) type II uncontrolled, periph vascular disorder ([place]) - Primary    hgba1c acceptable, minimize simple carbs. Increase exercise as tolerated. Continue current meds       Relevant Medications   metFORMIN (GLUCOPHAGE) 1000 MG tablet   Other Relevant Orders   Hemoglobin A1c   Microalbumin / creatinine urine ratio   POCT Urinalysis Dipstick (Automated)    Essential hypertension    .Well controlled, no changes to meds. Encouraged heart healthy diet such as the DASH diet and exercise as tolerated.        Hyperlipidemia LDL goal <70    hgba1c acceptable, minimize simple carbs. Increase exercise as tolerated. Continue current meds.sb      Relevant Orders   Lipid panel   Microalbumin / creatinine urine ratio   POCT Urinalysis Dipstick (Automated)      Follow-up: Return in about 6 months (around 07/04/2018) for annual exam, fasting.  Ann Held, DO

## 2018-01-01 NOTE — Assessment & Plan Note (Signed)
hgba1c acceptable, minimize simple carbs. Increase exercise as tolerated. Continue current meds 

## 2018-01-01 NOTE — Telephone Encounter (Addendum)
Patient is aware of results  ----- Message from Volanda Napoleon, MD sent at 01/01/2018  5:58 AM EDT ----- Call - VitD level is ok!!!  Laurey Arrow

## 2018-01-01 NOTE — Assessment & Plan Note (Signed)
Well controlled, no changes to meds. Encouraged heart healthy diet such as the DASH diet and exercise as tolerated.  °

## 2018-01-06 DIAGNOSIS — F250 Schizoaffective disorder, bipolar type: Secondary | ICD-10-CM | POA: Diagnosis not present

## 2018-01-08 ENCOUNTER — Encounter: Payer: Self-pay | Admitting: *Deleted

## 2018-01-26 ENCOUNTER — Other Ambulatory Visit: Payer: Self-pay | Admitting: Family Medicine

## 2018-01-26 DIAGNOSIS — I10 Essential (primary) hypertension: Secondary | ICD-10-CM

## 2018-01-26 DIAGNOSIS — K219 Gastro-esophageal reflux disease without esophagitis: Secondary | ICD-10-CM

## 2018-02-18 ENCOUNTER — Ambulatory Visit: Payer: Medicare HMO | Admitting: Family Medicine

## 2018-02-19 ENCOUNTER — Ambulatory Visit (INDEPENDENT_AMBULATORY_CARE_PROVIDER_SITE_OTHER): Payer: Medicare HMO | Admitting: Family Medicine

## 2018-02-19 ENCOUNTER — Encounter: Payer: Self-pay | Admitting: Family Medicine

## 2018-02-19 VITALS — BP 136/78 | HR 103 | Temp 98.2°F | Resp 16 | Ht 64.0 in | Wt 156.6 lb

## 2018-02-19 DIAGNOSIS — J40 Bronchitis, not specified as acute or chronic: Secondary | ICD-10-CM

## 2018-02-19 DIAGNOSIS — J4520 Mild intermittent asthma, uncomplicated: Secondary | ICD-10-CM

## 2018-02-19 MED ORDER — HYDROCOD POLST-CPM POLST ER 10-8 MG/5ML PO SUER: 5.0000 mL | Freq: Every evening | ORAL | 0 refills | Status: DC | PRN

## 2018-02-19 MED ORDER — IPRATROPIUM-ALBUTEROL 0.5-2.5 (3) MG/3ML IN SOLN
3.0000 mL | Freq: Once | RESPIRATORY_TRACT | Status: AC
  Administered 2018-02-19: 3 mL via RESPIRATORY_TRACT

## 2018-02-19 MED ORDER — AZITHROMYCIN 250 MG PO TABS: ORAL_TABLET | ORAL | 0 refills | Status: DC

## 2018-02-19 NOTE — Assessment & Plan Note (Signed)
con't symbicort and ventolin

## 2018-02-19 NOTE — Addendum Note (Signed)
Addended by: Kem Boroughs D on: 02/19/2018 03:57 PM   Modules accepted: Orders

## 2018-02-19 NOTE — Progress Notes (Signed)
Patient ID: Michelle Long, female   DOB: 24-Dec-1945, 72 y.o.   MRN: [id number]     Subjective:  I acted as a Education administrator for Dr. Carollee Herter.  Michelle Long, [name]   Patient ID: Michelle Long, female    DOB: 1945/12/22, 72 y.o.   MRN: [id number]  Chief Complaint  Patient presents with  . Cough    Cough  This is a new problem. Episode onset: saturday. Associated symptoms include headaches, nasal congestion and wheezing. Pertinent negatives include no ear congestion, ear pain, fever, postnasal drip, rhinorrhea or sore throat. Treatments tried: claritin.    Patient is in today for cough.she has been using her reg meds.   It started Saturday--- with chest pressure.    Patient Care Team: Carollee Herter, Alferd Apa, DO as PCP - General (Family Medicine) Hale Bogus., MD as Referring Physician (Gastroenterology) Franchot Gallo, MD as Consulting Physician (Urology) Ricard Dillon, MD (Psychiatry) Trula Slade, DPM as Consulting Physician (Podiatry) Aretha Parrot as Consulting Physician (Optometry) Clearence Ped, DDS (Dentistry)   Past Medical History:  Diagnosis Date  . Asthma   . Bipolar disorder ([place])   . Bladder cancer ([place])   . Breast cancer ([place])   . Cancer of lower-inner quadrant of left female breast ([place]) 12/08/2013  . Depression   . Diabetes mellitus   . GERD (gastroesophageal reflux disease)   . Hepatitis B infection   . History of transfusion of whole blood    Approximately 2009, due to breast cancer/chemo.  Marland Kitchen Hyperlipidemia     Past Surgical History:  Procedure Laterality Date  . ABDOMINAL HYSTERECTOMY    . APPENDECTOMY  1961  . bladder cancer  2006  . BREAST LUMPECTOMY Left 2007  . PARTIAL HYSTERECTOMY  1991  . TONSILLECTOMY  1951    Family History  Problem Relation Age of Onset  . Arthritis Mother   . Breast cancer Mother   . Transient ischemic attack Mother   . Hypertension Mother   . Heart disease Father   . Diabetes Father   . Cancer Father    bladder cancer  . Throat cancer Father     Social History   Socioeconomic History  . Marital status: Single    Spouse name: Not on file  . Number of children: Not on file  . Years of education: Not on file  . Highest education level: Not on file  Occupational History    Comment: retired  Scientific laboratory technician  . Financial resource strain: Not on file  . Food insecurity:    Worry: Not on file    Inability: Not on file  . Transportation needs:    Medical: Not on file    Non-medical: Not on file  Tobacco Use  . Smoking status: Former Smoker    Packs/day: 1.50    Years: 33.00    Pack years: 49.50    Types: Cigarettes    Start date: 12/08/1964    Last attempt to quit: 08/21/1998    Years since quitting: 19.5  . Smokeless tobacco: Never Used  . Tobacco comment: quit 16 years ago  Substance and Sexual Activity  . Alcohol use: Yes    Alcohol/week: 0.0 oz    Comment: very rarely  . Drug use: No  . Sexual activity: Not Currently    Partners: Male  Lifestyle  . Physical activity:    Days per week: Not on file    Minutes per session: Not on file  . Stress: Not  on file  Relationships  . Social connections:    Talks on phone: Not on file    Gets together: Not on file    Attends religious service: Not on file    Active member of club or organization: Not on file    Attends meetings of clubs or organizations: Not on file    Relationship status: Not on file  . Intimate partner violence:    Fear of current or ex partner: Not on file    Emotionally abused: Not on file    Physically abused: Not on file    Forced sexual activity: Not on file  Other Topics Concern  . Not on file  Social History Narrative   Exercise--- walks with neighbor    Outpatient Medications Prior to Visit  Medication Sig Dispense Refill  . aspirin 81 MG tablet Take 81 mg by mouth daily.    Marland Kitchen b complex vitamins tablet Take 1 tablet by mouth daily.    . Blood Glucose Monitoring Suppl (TRUE METRIX AIR GLUCOSE  METER) DEVI     . Cholecalciferol (VITAMIN D) 2000 units CAPS Take 5,000 Units by mouth daily.     . citalopram (CELEXA) 10 MG tablet Take 0.5 tablets (5 mg total) by mouth at bedtime. Taking 5 mg. Only. 90 tablet 1  . ezetimibe (ZETIA) 10 MG tablet TAKE 1 TABLET EVERY DAY 90 tablet 1  . fish oil-omega-3 fatty acids 1000 MG capsule Take 1 g by mouth daily.      . Lancets Misc. MISC Trueplus 33g lancets  Use as directed once a day 100 each 1  . LORazepam (ATIVAN) 0.5 MG tablet Take 1 tablet (0.5 mg total) by mouth 2 (two) times daily as needed. 90 tablet 0  . losartan (COZAAR) 50 MG tablet TAKE 1 TABLET EVERY DAY 90 tablet 1  . metFORMIN (GLUCOPHAGE) 1000 MG tablet Take 1 tablet (1,000 mg total) by mouth 2 (two) times daily with a meal. 180 tablet 3  . Multiple Vitamin (MULTIVITAMIN) tablet Take 1 tablet by mouth daily.    Marland Kitchen OLANZapine (ZYPREXA) 5 MG tablet Take 5 mg by mouth daily.    . pantoprazole (PROTONIX) 40 MG tablet TAKE 1 TABLET EVERY DAY 90 tablet 1  . rosuvastatin (CRESTOR) 5 MG tablet TAKE 1 TABLET BY MOUTH ONCE A WEEK FOR THREE WEEKS. THEN THREE TIMES A WEEK THERAFTER. 36 tablet 2  . Spacer/Aero Chamber Mouthpiece MISC To use with inhaler 1 each 1  . SYMBICORT 160-4.5 MCG/ACT inhaler INHALE 2 PUFFS TWICE DAILY 3 Inhaler 1  . TRUE METRIX BLOOD GLUCOSE TEST test strip CHECK YOUR BLOOD SUGAR ONE TIME DAILY 100 each 3  . VENTOLIN HFA 108 (90 Base) MCG/ACT inhaler INHALE TWO PUFFS INTO LUNGS EVERY 6 HOURS AS NEEDED FOR WHEEZING OR SHORTNESS OF BREATH 54 each 1  . albuterol (PROVENTIL HFA) 108 (90 Base) MCG/ACT inhaler Inhale 2 puffs into the lungs every 6 (six) hours as needed for wheezing or shortness of breath. 3 Inhaler 1  . Calcium Carb-Cholecalciferol (CALCIUM + D3 PO) Take by mouth.     No facility-administered medications prior to visit.     Allergies  Allergen Reactions  . Lamictal [Lamotrigine]     itch  . Prozac [Fluoxetine Hcl]     seizure  . Statins Other (See  Comments)    Muscle pains  . Sulfa Drugs Cross Reactors     Dyspnea     Review of Systems  Constitutional: Negative for fever.  HENT: Negative for ear pain, postnasal drip, rhinorrhea and sore throat.   Respiratory: Positive for cough and wheezing.   Neurological: Positive for headaches.       Objective:    Physical Exam  Constitutional: She is oriented to person, place, and time. She appears well-developed and well-nourished.  HENT:  Right Ear: External ear normal.  Left Ear: External ear normal.  + PND + errythema  Eyes: Conjunctivae are normal. Right eye exhibits no discharge. Left eye exhibits no discharge.  Cardiovascular: Normal rate, regular rhythm and normal heart sounds.  No murmur heard. Pulmonary/Chest: Effort normal. No respiratory distress. She has wheezes. She has rhonchi. She has no rales.     She exhibits no tenderness.  Musculoskeletal: She exhibits no edema.  Lymphadenopathy:    She has cervical adenopathy.       Right cervical: Superficial cervical adenopathy present.       Left cervical: Superficial cervical adenopathy present.  Neurological: She is alert and oriented to person, place, and time.  Nursing note and vitals reviewed.   BP 136/78 (BP Location: Right Arm, Cuff Size: Normal)   Pulse (!) 103   Temp 98.2 F (36.8 C) (Oral)   Resp 16   Ht '5\' 4"'  (1.626 m)   Wt 156 lb 9.6 oz (71 kg)   SpO2 94%   BMI 26.88 kg/m  Wt Readings from Last 3 Encounters:  02/19/18 156 lb 9.6 oz (71 kg)  01/01/18 155 lb 9.6 oz (70.6 kg)  12/31/17 155 lb 12.8 oz (70.7 kg)   BP Readings from Last 3 Encounters:  02/19/18 136/78  01/01/18 134/71  12/31/17 137/77     Immunization History  Administered Date(s) Administered  . Influenza Whole 07/01/2012  . Influenza,inj,Quad PF,6+ Mos 08/12/2013, 07/22/2014, 07/03/2016  . Influenza-Unspecified 09/20/2015, 06/19/2017  . Pneumococcal Conjugate-13 01/30/2015  . Pneumococcal Polysaccharide-23 10/23/2003,  10/22/2013  . Td 06/22/2013  . Tdap 09/20/2015  . Zoster 09/07/2013    Health Maintenance  Topic Date Due  . OPHTHALMOLOGY EXAM  07/15/2017  . COLONOSCOPY  01/15/2018  . INFLUENZA VACCINE  05/21/2018  . HEMOGLOBIN A1C  07/04/2018  . FOOT EXAM  01/02/2019  . MAMMOGRAM  06/17/2019  . TETANUS/TDAP  09/19/2025  . DEXA SCAN  Completed  . Hepatitis C Screening  Completed  . PNA vac Low Risk Adult  Completed    Lab Results  Component Value Date   WBC 7.5 12/31/2017   HGB 14.2 12/31/2017   HCT 43.9 12/31/2017   PLT 180 12/31/2017   GLUCOSE 160 (H) 12/31/2017   CHOL 126 01/01/2018   TRIG 91.0 01/01/2018   HDL 48.70 01/01/2018   LDLDIRECT 145.6 06/22/2013   LDLCALC 60 01/01/2018   ALT 56 (H) 12/31/2017   AST 47 (H) 12/31/2017   NA 145 12/31/2017   K 4.4 12/31/2017   CL 105 12/31/2017   CREATININE 0.90 12/31/2017   BUN 9 12/31/2017   CO2 29 12/31/2017   TSH 0.80 01/30/2015   INR 0.97 09/25/2011   HGBA1C 6.7 (H) 01/01/2018   MICROALBUR 1.9 01/01/2018    Lab Results  Component Value Date   TSH 0.80 01/30/2015   Lab Results  Component Value Date   WBC 7.5 12/31/2017   HGB 14.2 12/31/2017   HCT 43.9 12/31/2017   MCV 92.2 12/31/2017   PLT 180 12/31/2017   Lab Results  Component Value Date   NA 145 12/31/2017   K 4.4 12/31/2017   CHLORIDE 103 01/01/2017   CO2  29 12/31/2017   GLUCOSE 160 (H) 12/31/2017   BUN 9 12/31/2017   CREATININE 0.90 12/31/2017   BILITOT 0.6 12/31/2017   ALKPHOS 56 12/31/2017   AST 47 (H) 12/31/2017   ALT 56 (H) 12/31/2017   PROT 7.3 12/31/2017   ALBUMIN 4.0 12/31/2017   CALCIUM 9.4 12/31/2017   ANIONGAP 11 12/31/2017   EGFR 70 (L) 01/01/2017   GFR 78.39 12/11/2017   Lab Results  Component Value Date   CHOL 126 01/01/2018   Lab Results  Component Value Date   HDL 48.70 01/01/2018   Lab Results  Component Value Date   LDLCALC 60 01/01/2018   Lab Results  Component Value Date   TRIG 91.0 01/01/2018   Lab Results  Component  Value Date   CHOLHDL 3 01/01/2018   Lab Results  Component Value Date   HGBA1C 6.7 (H) 01/01/2018         Assessment & Plan:   Problem List Items Addressed This Visit      Unprioritized   Asthma    con't symbicort and ventolin         Other Visit Diagnoses    Bronchitis    -  Primary   Relevant Medications   azithromycin (ZITHROMAX Z-PAK) 250 MG tablet   chlorpheniramine-HYDROcodone (TUSSIONEX PENNKINETIC ER) 10-8 MG/5ML SUER      I have discontinued Wynona Neat. Dula's Calcium Carb-Cholecalciferol (CALCIUM + D3 PO) and albuterol. I am also having her start on azithromycin and chlorpheniramine-HYDROcodone. Additionally, I am having her maintain her fish oil-omega-3 fatty acids, aspirin, OLANZapine, TRUE METRIX AIR GLUCOSE METER, Vitamin D, b complex vitamins, LORazepam, citalopram, Spacer/Aero Chamber Mouthpiece, TRUE METRIX BLOOD GLUCOSE TEST, VENTOLIN HFA, rosuvastatin, ezetimibe, Lancets Misc., multivitamin, metFORMIN, SYMBICORT, pantoprazole, and losartan.  Meds ordered this encounter  Medications  . azithromycin (ZITHROMAX Z-PAK) 250 MG tablet    Sig: As directed    Dispense:  6 each    Refill:  0  . chlorpheniramine-HYDROcodone (TUSSIONEX PENNKINETIC ER) 10-8 MG/5ML SUER    Sig: Take 5 mLs by mouth at bedtime as needed for cough.    Dispense:  140 mL    Refill:  0    CMA served as scribe during this visit. History, Physical and Plan performed by medical provider. Documentation and orders reviewed and attested to.  Ann Held, DO

## 2018-02-19 NOTE — Patient Instructions (Signed)

## 2018-03-03 NOTE — Progress Notes (Deleted)
Subjective:   Michelle Long is a 72 y.o. female who presents for Medicare Annual (Subsequent) preventive examination.  Review of Systems: No ROS.  Medicare Wellness Visit. Additional risk factors are reflected in the social history.   Sleep patterns:  Home Safety/Smoke Alarms: Feels safe in home. Smoke alarms in place.  Living environment; residence and Firearm Safety:    Female:         Mammo- utd       Dexa scan-        CCS-last 01/15/13    Objective:     Vitals: There were no vitals taken for this visit.  There is no height or weight on file to calculate BMI.  Advanced Directives 12/31/2017 07/02/2017 03/03/2017 01/01/2017 07/03/2016 01/01/2016 11/17/2015  Does Patient Have a Medical Advance Directive? Yes Yes Yes Yes Yes No Yes  Type of Paramedic of [place];Living will [place];Living will [place];Living will [place];Living will [place];Living will - [place];Living will  Does patient want to make changes to medical advance directive? - - - - - - No - Patient declined  Copy of [place] in Chart? No - copy requested - No - copy requested Yes No - copy requested - No - copy requested  Would patient like information on creating a medical advance directive? - - - - - No - patient declined information -    Tobacco Social History   Tobacco Use  Smoking Status Former Smoker  . Packs/day: 1.50  . Years: 33.00  . Pack years: 49.50  . Types: Cigarettes  . Start date: 12/08/1964  . Last attempt to quit: 08/21/1998  . Years since quitting: 19.5  Smokeless Tobacco Never Used  Tobacco Comment   quit 16 years ago     Counseling given: Not Answered Comment: quit 16 years ago   Clinical Intake:                       Past Medical History:  Diagnosis Date  . Asthma   . Bipolar disorder ([place])   . Bladder cancer ([place])   .  Breast cancer ([place])   . Cancer of lower-inner quadrant of left female breast ([place]) 12/08/2013  . Depression   . Diabetes mellitus   . GERD (gastroesophageal reflux disease)   . Hepatitis B infection   . History of transfusion of whole blood    Approximately 2009, due to breast cancer/chemo.  Marland Kitchen Hyperlipidemia    Past Surgical History:  Procedure Laterality Date  . ABDOMINAL HYSTERECTOMY    . APPENDECTOMY  1961  . bladder cancer  2006  . BREAST LUMPECTOMY Left 2007  . PARTIAL HYSTERECTOMY  1991  . TONSILLECTOMY  1951   Family History  Problem Relation Age of Onset  . Arthritis Mother   . Breast cancer Mother   . Transient ischemic attack Mother   . Hypertension Mother   . Heart disease Father   . Diabetes Father   . Cancer Father        bladder cancer  . Throat cancer Father    Social History   Socioeconomic History  . Marital status: Single    Spouse name: Not on file  . Number of children: Not on file  . Years of education: Not on file  . Highest education level: Not on file  Occupational History    Comment: retired  Social Needs  . Financial resource strain: Not on file  . Food insecurity:    Worry: Not on file    Inability: Not on file  . Transportation needs:    Medical: Not on file    Non-medical: Not on file  Tobacco Use  . Smoking status: Former Smoker    Packs/day: 1.50    Years: 33.00    Pack years: 49.50    Types: Cigarettes    Start date: 12/08/1964    Last attempt to quit: 08/21/1998    Years since quitting: 19.5  . Smokeless tobacco: Never Used  . Tobacco comment: quit 16 years ago  Substance and Sexual Activity  . Alcohol use: Yes    Alcohol/week: 0.0 oz    Comment: very rarely  . Drug use: No  . Sexual activity: Not Currently    Partners: Male  Lifestyle  . Physical activity:    Days per week: Not on file    Minutes per session: Not on file  . Stress: Not on file  Relationships  . Social connections:    Talks on phone: Not on file      Gets together: Not on file    Attends religious service: Not on file    Active member of club or organization: Not on file    Attends meetings of clubs or organizations: Not on file    Relationship status: Not on file  Other Topics Concern  . Not on file  Social History Narrative   Exercise--- walks with neighbor    Outpatient Encounter Medications as of 03/06/2018  Medication Sig  . aspirin 81 MG tablet Take 81 mg by mouth daily.  Marland Kitchen azithromycin (ZITHROMAX Z-PAK) 250 MG tablet As directed  . b complex vitamins tablet Take 1 tablet by mouth daily.  . Blood Glucose Monitoring Suppl (TRUE METRIX AIR GLUCOSE METER) DEVI   . chlorpheniramine-HYDROcodone (TUSSIONEX PENNKINETIC ER) 10-8 MG/5ML SUER Take 5 mLs by mouth at bedtime as needed for cough.  . Cholecalciferol (VITAMIN D) 2000 units CAPS Take 5,000 Units by mouth daily.   . citalopram (CELEXA) 10 MG tablet Take 0.5 tablets (5 mg total) by mouth at bedtime. Taking 5 mg. Only.  . ezetimibe (ZETIA) 10 MG tablet TAKE 1 TABLET EVERY DAY  . fish oil-omega-3 fatty acids 1000 MG capsule Take 1 g by mouth daily.    . Lancets Misc. MISC Trueplus 33g lancets  Use as directed once a day  . LORazepam (ATIVAN) 0.5 MG tablet Take 1 tablet (0.5 mg total) by mouth 2 (two) times daily as needed.  Marland Kitchen losartan (COZAAR) 50 MG tablet TAKE 1 TABLET EVERY DAY  . metFORMIN (GLUCOPHAGE) 1000 MG tablet Take 1 tablet (1,000 mg total) by mouth 2 (two) times daily with a meal.  . Multiple Vitamin (MULTIVITAMIN) tablet Take 1 tablet by mouth daily.  Marland Kitchen OLANZapine (ZYPREXA) 5 MG tablet Take 5 mg by mouth daily.  . pantoprazole (PROTONIX) 40 MG tablet TAKE 1 TABLET EVERY DAY  . rosuvastatin (CRESTOR) 5 MG tablet TAKE 1 TABLET BY MOUTH ONCE A WEEK FOR THREE WEEKS. THEN THREE TIMES A WEEK THERAFTER.  Marland Kitchen Spacer/Aero Chamber Mouthpiece MISC To use with inhaler  . SYMBICORT 160-4.5 MCG/ACT inhaler INHALE 2 PUFFS TWICE DAILY  . TRUE METRIX BLOOD GLUCOSE TEST test strip  CHECK YOUR BLOOD SUGAR ONE TIME DAILY  . VENTOLIN HFA 108 (90 Base) MCG/ACT inhaler INHALE TWO PUFFS INTO LUNGS EVERY 6 HOURS AS NEEDED FOR WHEEZING OR SHORTNESS  OF BREATH   No facility-administered encounter medications on file as of 03/06/2018.     Activities of Daily Living In your present state of health, do you have any difficulty performing the following activities: 03/03/2017  Hearing? N  Vision? N  Difficulty concentrating or making decisions? N  Walking or climbing stairs? N  Dressing or bathing? N  Doing errands, shopping? N  Preparing Food and eating ? N  Using the Toilet? N  Managing your Medications? N  Managing your Finances? N  Housekeeping or managing your Housekeeping? N  Some recent data might be hidden    Patient Care Team: Carollee Herter, Alferd Apa, DO as PCP - General (Family Medicine) Hale Bogus., MD as Referring Physician (Gastroenterology) Franchot Gallo, MD as Consulting Physician (Urology) Ricard Dillon, MD (Psychiatry) Trula Slade, DPM as Consulting Physician (Podiatry) Aretha Parrot as Consulting Physician (Optometry) Clearence Ped, DDS (Dentistry)    Assessment:   This is a routine wellness examination for [facility]. Physical assessment deferred to PCP.  Exercise Activities and Dietary recommendations   Diet (meal preparation, eat out, water intake, caffeinated beverages, dairy products, fruits and vegetables): {Desc; diets:16563} Breakfast: Lunch:  Dinner:      Goals    None      Fall Risk Fall Risk  03/03/2017 12/10/2016 07/03/2016 01/01/2016 11/17/2015  Falls in the past year? No No No No No  Risk for fall due to : - - - - -    Depression Screen PHQ 2/9 Scores 03/03/2017 12/10/2016 11/17/2015 01/30/2015  PHQ - 2 Score 0 0 0 0     Cognitive Function MMSE - Mini Mental State Exam 03/03/2017  Orientation to time 5  Orientation to Place 5  Registration 3  Attention/ Calculation 5  Recall 3  Language- name 2 objects 2    Language- repeat 1  Language- follow 3 step command 3  Language- read & follow direction 1  Write a sentence 1  Copy design 1  Total score 30        Immunization History  Administered Date(s) Administered  . Influenza Whole 07/01/2012  . Influenza,inj,Quad PF,6+ Mos 08/12/2013, 07/22/2014, 07/03/2016  . Influenza-Unspecified 09/20/2015, 06/19/2017  . Pneumococcal Conjugate-13 01/30/2015  . Pneumococcal Polysaccharide-23 10/23/2003, 10/22/2013  . Td 06/22/2013  . Tdap 09/20/2015  . Zoster 09/07/2013    Screening Tests Health Maintenance  Topic Date Due  . OPHTHALMOLOGY EXAM  07/15/2017  . COLONOSCOPY  01/15/2018  . INFLUENZA VACCINE  05/21/2018  . HEMOGLOBIN A1C  07/04/2018  . FOOT EXAM  01/02/2019  . MAMMOGRAM  06/17/2019  . TETANUS/TDAP  09/19/2025  . DEXA SCAN  Completed  . Hepatitis C Screening  Completed  . PNA vac Low Risk Adult  Completed      Plan:   ***   I have personally reviewed and noted the following in the patient's chart:   . Medical and social history . Use of alcohol, tobacco or illicit drugs  . Current medications and supplements . Functional ability and status . Nutritional status . Physical activity . Advanced directives . List of other physicians . Hospitalizations, surgeries, and ER visits in previous 12 months . Vitals . Screenings to include cognitive, depression, and falls . Referrals and appointments  In addition, I have reviewed and discussed with patient certain preventive protocols, quality metrics, and best practice recommendations. A written personalized care plan for preventive services as well as general preventive health recommendations were provided to patient.  Shela Nevin, [place]  03/03/2018

## 2018-03-04 ENCOUNTER — Ambulatory Visit: Payer: Medicare HMO | Admitting: *Deleted

## 2018-03-06 ENCOUNTER — Ambulatory Visit: Payer: Medicare HMO | Admitting: *Deleted

## 2018-03-06 NOTE — Progress Notes (Signed)
Subjective:   Michelle Long is a 72 y.o. female who presents for Medicare Annual (Subsequent) preventive examination.  Review of Systems: Physical assessment deferred to PCP. Cardiac Risk Factors include: advanced age (>7men, >52 women);diabetes mellitus;dyslipidemia;hypertension;sedentary lifestyle Sleep patterns: WAkes once to urinate. Sleeps from 10p-7a.  Home Safety/Smoke Alarms: Feels safe in home. Smoke alarms in place.  Living environment; residence and Firearm Safety: Lives in 1 story home with basement. Dtr lives with her. Walk in shower  Female:   Pap-hysterectomy       Mammo- utd      Dexa scan-  ordered      CCS-pt states she has scheduled for end of the month.  Objective:     Vitals: BP (!) 120/58 (BP Location: Right Arm, Patient Position: Sitting, Cuff Size: Normal)   Pulse 90   Ht 5\' 4"  (1.626 m)   Wt 157 lb (71.2 kg)   SpO2 96%   BMI 26.95 kg/m   Body mass index is 26.95 kg/m.  Advanced Directives 03/09/2018 12/31/2017 07/02/2017 03/03/2017 01/01/2017 07/03/2016 01/01/2016  Does Patient Have a Medical Advance Directive? Yes Yes Yes Yes Yes Yes No  Type of Paramedic of [place];Living will [place];Living will [place];Living will [place];Living will [place];Living will [place];Living will -  Does patient want to make changes to medical advance directive? No - Patient declined - - - - - -  Copy of [place] in Chart? No - copy requested No - copy requested - No - copy requested Yes No - copy requested -  Would patient like information on creating a medical advance directive? - - - - - - No - patient declined information    Tobacco Social History   Tobacco Use  Smoking Status Former Smoker  . Packs/day: 1.50  . Years: 33.00  . Pack years: 49.50  . Types: Cigarettes  . Start date: 12/08/1964  . Last attempt to quit:  08/21/1998  . Years since quitting: 19.5  Smokeless Tobacco Never Used  Tobacco Comment   quit 16 years ago     Counseling given: Not Answered Comment: quit 16 years ago   Clinical Intake:  Pain : No/denies pain     Past Medical History:  Diagnosis Date  . Asthma   . Bipolar disorder ([place])   . Bladder cancer ([place])   . Breast cancer ([place])   . Cancer of lower-inner quadrant of left female breast ([place]) 12/08/2013  . Depression   . Diabetes mellitus   . GERD (gastroesophageal reflux disease)   . Hepatitis B infection   . History of transfusion of whole blood    Approximately 2009, due to breast cancer/chemo.  Marland Kitchen Hyperlipidemia    Past Surgical History:  Procedure Laterality Date  . ABDOMINAL HYSTERECTOMY    . APPENDECTOMY  1961  . bladder cancer  2006  . BREAST LUMPECTOMY Left 2007  . PARTIAL HYSTERECTOMY  1991  . TONSILLECTOMY  1951   Family History  Problem Relation Age of Onset  . Arthritis Mother   . Breast cancer Mother   . Transient ischemic attack Mother   . Hypertension Mother   . Heart disease Father   . Diabetes Father   . Cancer Father        bladder cancer  . Throat cancer Father    Social History   Socioeconomic History  . Marital status: Single  Spouse name: Not on file  . Number of children: Not on file  . Years of education: Not on file  . Highest education level: Not on file  Occupational History    Comment: retired  Scientific laboratory technician  . Financial resource strain: Not on file  . Food insecurity:    Worry: Not on file    Inability: Not on file  . Transportation needs:    Medical: Not on file    Non-medical: Not on file  Tobacco Use  . Smoking status: Former Smoker    Packs/day: 1.50    Years: 33.00    Pack years: 49.50    Types: Cigarettes    Start date: 12/08/1964    Last attempt to quit: 08/21/1998    Years since quitting: 19.5  . Smokeless tobacco: Never Used  . Tobacco comment: quit 16 years ago  Substance and Sexual Activity    . Alcohol use: Yes    Alcohol/week: 0.0 oz    Comment: very rarely  . Drug use: No  . Sexual activity: Not Currently    Partners: Male  Lifestyle  . Physical activity:    Days per week: Not on file    Minutes per session: Not on file  . Stress: Not on file  Relationships  . Social connections:    Talks on phone: Not on file    Gets together: Not on file    Attends religious service: Not on file    Active member of club or organization: Not on file    Attends meetings of clubs or organizations: Not on file    Relationship status: Not on file  Other Topics Concern  . Not on file  Social History Narrative   Exercise--- walks with neighbor    Outpatient Encounter Medications as of 03/09/2018  Medication Sig  . aspirin 81 MG tablet Take 81 mg by mouth daily.  Marland Kitchen b complex vitamins tablet Take 1 tablet by mouth daily.  . Blood Glucose Monitoring Suppl (TRUE METRIX AIR GLUCOSE METER) DEVI   . Cholecalciferol (VITAMIN D) 2000 units CAPS Take 5,000 Units by mouth daily.   . citalopram (CELEXA) 10 MG tablet Take 0.5 tablets (5 mg total) by mouth at bedtime. Taking 5 mg. Only.  . ezetimibe (ZETIA) 10 MG tablet TAKE 1 TABLET EVERY DAY  . fish oil-omega-3 fatty acids 1000 MG capsule Take 1 g by mouth daily.    Marland Kitchen LORazepam (ATIVAN) 0.5 MG tablet Take 1 tablet (0.5 mg total) by mouth 2 (two) times daily as needed.  Marland Kitchen losartan (COZAAR) 50 MG tablet TAKE 1 TABLET EVERY DAY  . metFORMIN (GLUCOPHAGE) 1000 MG tablet Take 1 tablet (1,000 mg total) by mouth 2 (two) times daily with a meal.  . Multiple Vitamin (MULTIVITAMIN) tablet Take 1 tablet by mouth daily.  Marland Kitchen OLANZapine (ZYPREXA) 5 MG tablet Take 5 mg by mouth daily.  . pantoprazole (PROTONIX) 40 MG tablet TAKE 1 TABLET EVERY DAY  . rosuvastatin (CRESTOR) 5 MG tablet TAKE 1 TABLET BY MOUTH ONCE A WEEK FOR THREE WEEKS. THEN THREE TIMES A WEEK THERAFTER.  Marland Kitchen Spacer/Aero Chamber Mouthpiece MISC To use with inhaler  . SYMBICORT 160-4.5 MCG/ACT  inhaler INHALE 2 PUFFS TWICE DAILY  . TRUE METRIX BLOOD GLUCOSE TEST test strip CHECK YOUR BLOOD SUGAR ONE TIME DAILY  . VENTOLIN HFA 108 (90 Base) MCG/ACT inhaler INHALE TWO PUFFS INTO LUNGS EVERY 6 HOURS AS NEEDED FOR WHEEZING OR SHORTNESS OF BREATH  . Lancets Misc. [place]  Trueplus 33g lancets  Use as directed once a day (Patient not taking: Reported on 03/09/2018)  . [DISCONTINUED] azithromycin (ZITHROMAX Z-PAK) 250 MG tablet As directed  . [DISCONTINUED] chlorpheniramine-HYDROcodone (TUSSIONEX PENNKINETIC ER) 10-8 MG/5ML SUER Take 5 mLs by mouth at bedtime as needed for cough.   No facility-administered encounter medications on file as of 03/09/2018.     Activities of Daily Living In your present state of health, do you have any difficulty performing the following activities: 03/09/2018  Hearing? N  Vision? N  Comment wears glasses for distance.  Difficulty concentrating or making decisions? N  Walking or climbing stairs? N  Dressing or bathing? N  Doing errands, shopping? N  Preparing Food and eating ? N  Using the Toilet? N  In the past six months, have you accidently leaked urine? N  Do you have problems with loss of bowel control? N  Managing your Medications? N  Managing your Finances? N  Housekeeping or managing your Housekeeping? N  Some recent data might be hidden    Patient Care Team: Carollee Herter, Alferd Apa, DO as PCP - General (Family Medicine) Hale Bogus., MD as Referring Physician (Gastroenterology) Franchot Gallo, MD as Consulting Physician (Urology) Ricard Dillon, MD (Psychiatry) Trula Slade, DPM as Consulting Physician (Podiatry) Aretha Parrot as Consulting Physician (Optometry) Clearence Ped, DDS (Dentistry)    Assessment:   This is a routine wellness examination for [facility]. Physical assessment deferred to PCP.  Exercise Activities and Dietary recommendations Current Exercise Habits: The patient does not participate in regular exercise at  present, Exercise limited by: None identified Diet (meal preparation, eat out, water intake, caffeinated beverages, dairy products, fruits and vegetables): in general, a "healthy" diet  , well balanced Breakfast: greek yogurt, rice pudding, coffee and juice Lunch: cheese stick Dinner: pasta with vegetables and salad       Goals    . Increase physical activity     Begin silver sneakers       Fall Risk Fall Risk  03/09/2018 03/03/2017 12/10/2016 07/03/2016 01/01/2016  Falls in the past year? No No No No No  Risk for fall due to : - - - - -    Depression Screen PHQ 2/9 Scores 03/09/2018 03/03/2017 12/10/2016 11/17/2015  PHQ - 2 Score 0 0 0 0     Cognitive Function MMSE - Mini Mental State Exam 03/03/2017  Orientation to time 5  Orientation to Place 5  Registration 3  Attention/ Calculation 5  Recall 3  Language- name 2 objects 2  Language- repeat 1  Language- follow 3 step command 3  Language- read & follow direction 1  Write a sentence 1  Copy design 1  Total score 30        Immunization History  Administered Date(s) Administered  . Influenza Whole 07/01/2012  . Influenza,inj,Quad PF,6+ Mos 08/12/2013, 07/22/2014, 07/03/2016  . Influenza-Unspecified 09/20/2015, 06/19/2017  . Pneumococcal Conjugate-13 01/30/2015  . Pneumococcal Polysaccharide-23 10/23/2003, 10/22/2013  . Td 06/22/2013  . Tdap 09/20/2015  . Zoster 09/07/2013    Screening Tests Health Maintenance  Topic Date Due  . OPHTHALMOLOGY EXAM  07/15/2017  . COLONOSCOPY  01/15/2018  . INFLUENZA VACCINE  05/21/2018  . HEMOGLOBIN A1C  07/04/2018  . FOOT EXAM  01/02/2019  . MAMMOGRAM  06/17/2019  . TETANUS/TDAP  09/19/2025  . DEXA SCAN  Completed  . Hepatitis C Screening  Completed  . PNA vac Low Risk Adult  Completed      Plan:  Please schedule your next medicare wellness visit with me in 1 yr.  Continue to eat heart healthy diet (full of fruits, vegetables, whole grains, lean protein, water--limit  salt, fat, and sugar intake) and increase physical activity as tolerated.  Continue doing brain stimulating activities (puzzles, reading, adult coloring books, staying active) to keep memory sharp.   Bring a copy of your living will and/or healthcare power of attorney to your next office visit.   I have personally reviewed and noted the following in the patient's chart:   . Medical and social history . Use of alcohol, tobacco or illicit drugs  . Current medications and supplements . Functional ability and status . Nutritional status . Physical activity . Advanced directives . List of other physicians . Hospitalizations, surgeries, and ER visits in previous 12 months . Vitals . Screenings to include cognitive, depression, and falls . Referrals and appointments  In addition, I have reviewed and discussed with patient certain preventive protocols, quality metrics, and best practice recommendations. A written personalized care plan for preventive services as well as general preventive health recommendations were provided to patient.     Shela Nevin, [place]  03/09/2018

## 2018-03-09 ENCOUNTER — Encounter: Payer: Self-pay | Admitting: *Deleted

## 2018-03-09 ENCOUNTER — Ambulatory Visit (INDEPENDENT_AMBULATORY_CARE_PROVIDER_SITE_OTHER): Payer: Medicare HMO | Admitting: *Deleted

## 2018-03-09 VITALS — BP 120/58 | HR 90 | Ht 64.0 in | Wt 157.0 lb

## 2018-03-09 DIAGNOSIS — Z780 Asymptomatic menopausal state: Secondary | ICD-10-CM | POA: Diagnosis not present

## 2018-03-09 DIAGNOSIS — Z0000 Encounter for general adult medical examination without abnormal findings: Secondary | ICD-10-CM

## 2018-03-09 NOTE — Progress Notes (Signed)
Reviewed  Ejay Lashley R Lowne Chase, DO  

## 2018-03-09 NOTE — Patient Instructions (Signed)
Please schedule your next medicare wellness visit with me in 1 yr.  Continue to eat heart healthy diet (full of fruits, vegetables, whole grains, lean protein, water--limit salt, fat, and sugar intake) and increase physical activity as tolerated.  Continue doing brain stimulating activities (puzzles, reading, adult coloring books, staying active) to keep memory sharp.   Bring a copy of your living will and/or healthcare power of attorney to your next office visit.   Michelle Long , Thank you for taking time to come for your Medicare Wellness Visit. I appreciate your ongoing commitment to your health goals. Please review the following plan we discussed and let me know if I can assist you in the future.   These are the goals we discussed: Goals    . Increase physical activity     Begin silver sneakers       This is a list of the screening recommended for you and due dates:  Health Maintenance  Topic Date Due  . Eye exam for diabetics  07/15/2017  . Colon Cancer Screening  01/15/2018  . Flu Shot  05/21/2018  . Hemoglobin A1C  07/04/2018  . Complete foot exam   01/02/2019  . Mammogram  06/17/2019  . Tetanus Vaccine  09/19/2025  . DEXA scan (bone density measurement)  Completed  .  Hepatitis C: One time screening is recommended by Center for Disease Control  (CDC) for  adults born from [birth year] through 1965.   Completed  . Pneumonia vaccines  Completed    Health Maintenance for Postmenopausal Women Menopause is a normal process in which your reproductive ability comes to an end. This process happens gradually over a span of months to years, usually between the ages of 73 and 72. Menopause is complete when you have missed 12 consecutive menstrual periods. It is important to talk with your health care provider about some of the most common conditions that affect postmenopausal women, such as heart disease, cancer, and bone loss (osteoporosis). Adopting a healthy lifestyle and getting  preventive care can help to promote your health and wellness. Those actions can also lower your chances of developing some of these common conditions. What should I know about menopause? During menopause, you may experience a number of symptoms, such as:  Moderate-to-severe hot flashes.  Night sweats.  Decrease in sex drive.  Mood swings.  Headaches.  Tiredness.  Irritability.  Memory problems.  Insomnia.  Choosing to treat or not to treat menopausal changes is an individual decision that you make with your health care provider. What should I know about hormone replacement therapy and supplements? Hormone therapy products are effective for treating symptoms that are associated with menopause, such as hot flashes and night sweats. Hormone replacement carries certain risks, especially as you become older. If you are thinking about using estrogen or estrogen with progestin treatments, discuss the benefits and risks with your health care provider. What should I know about heart disease and stroke? Heart disease, heart attack, and stroke become more likely as you age. This may be due, in part, to the hormonal changes that your body experiences during menopause. These can affect how your body processes dietary fats, triglycerides, and cholesterol. Heart attack and stroke are both medical emergencies. There are many things that you can do to help prevent heart disease and stroke:  Have your blood pressure checked at least every 1-2 years. High blood pressure causes heart disease and increases the risk of stroke.  If you are 54-82 years old,  ask your health care provider if you should take aspirin to prevent a heart attack or a stroke.  Do not use any tobacco products, including cigarettes, chewing tobacco, or electronic cigarettes. If you need help quitting, ask your health care provider.  It is important to eat a healthy diet and maintain a healthy weight. ? Be sure to include plenty of  vegetables, fruits, low-fat dairy products, and lean protein. ? Avoid eating foods that are high in solid fats, added sugars, or salt (sodium).  Get regular exercise. This is one of the most important things that you can do for your health. ? Try to exercise for at least 150 minutes each week. The type of exercise that you do should increase your heart rate and make you sweat. This is known as moderate-intensity exercise. ? Try to do strengthening exercises at least twice each week. Do these in addition to the moderate-intensity exercise.  Know your numbers.Ask your health care provider to check your cholesterol and your blood glucose. Continue to have your blood tested as directed by your health care provider.  What should I know about cancer screening? There are several types of cancer. Take the following steps to reduce your risk and to catch any cancer development as early as possible. Breast Cancer  Practice breast self-awareness. ? This means understanding how your breasts normally appear and feel. ? It also means doing regular breast self-exams. Let your health care provider know about any changes, no matter how small.  If you are 52 or older, have a clinician do a breast exam (clinical breast exam or CBE) every year. Depending on your age, family history, and medical history, it may be recommended that you also have a yearly breast X-ray (mammogram).  If you have a family history of breast cancer, talk with your health care provider about genetic screening.  If you are at high risk for breast cancer, talk with your health care provider about having an MRI and a mammogram every year.  Breast cancer (BRCA) gene test is recommended for women who have family members with BRCA-related cancers. Results of the assessment will determine the need for genetic counseling and BRCA1 and for BRCA2 testing. BRCA-related cancers include these types: ? Breast. This occurs in males or  females. ? Ovarian. ? Tubal. This may also be called fallopian tube cancer. ? Cancer of the abdominal or pelvic lining (peritoneal cancer). ? Prostate. ? Pancreatic.  Cervical, Uterine, and Ovarian Cancer Your health care provider may recommend that you be screened regularly for cancer of the pelvic organs. These include your ovaries, uterus, and vagina. This screening involves a pelvic exam, which includes checking for microscopic changes to the surface of your cervix (Pap test).  For women ages 21-65, health care providers may recommend a pelvic exam and a Pap test every three years. For women ages 40-65, they may recommend the Pap test and pelvic exam, combined with testing for human papilloma virus (HPV), every five years. Some types of HPV increase your risk of cervical cancer. Testing for HPV may also be done on women of any age who have unclear Pap test results.  Other health care providers may not recommend any screening for nonpregnant women who are considered low risk for pelvic cancer and have no symptoms. Ask your health care provider if a screening pelvic exam is right for you.  If you have had past treatment for cervical cancer or a condition that could lead to cancer, you need Pap  tests and screening for cancer for at least 20 years after your treatment. If Pap tests have been discontinued for you, your risk factors (such as having a new sexual partner) need to be reassessed to determine if you should start having screenings again. Some women have medical problems that increase the chance of getting cervical cancer. In these cases, your health care provider may recommend that you have screening and Pap tests more often.  If you have a family history of uterine cancer or ovarian cancer, talk with your health care provider about genetic screening.  If you have vaginal bleeding after reaching menopause, tell your health care provider.  There are currently no reliable tests available  to screen for ovarian cancer.  Lung Cancer Lung cancer screening is recommended for adults 46-41 years old who are at high risk for lung cancer because of a history of smoking. A yearly low-dose CT scan of the lungs is recommended if you:  Currently smoke.  Have a history of at least 30 pack-years of smoking and you currently smoke or have quit within the past 15 years. A pack-year is smoking an average of one pack of cigarettes per day for one year.  Yearly screening should:  Continue until it has been 15 years since you quit.  Stop if you develop a health problem that would prevent you from having lung cancer treatment.  Colorectal Cancer  This type of cancer can be detected and can often be prevented.  Routine colorectal cancer screening usually begins at age 23 and continues through age 60.  If you have risk factors for colon cancer, your health care provider may recommend that you be screened at an earlier age.  If you have a family history of colorectal cancer, talk with your health care provider about genetic screening.  Your health care provider may also recommend using home test kits to check for hidden blood in your stool.  A small camera at the end of a tube can be used to examine your colon directly (sigmoidoscopy or colonoscopy). This is done to check for the earliest forms of colorectal cancer.  Direct examination of the colon should be repeated every 5-10 years until age 86. However, if early forms of precancerous polyps or small growths are found or if you have a family history or genetic risk for colorectal cancer, you may need to be screened more often.  Skin Cancer  Check your skin from head to toe regularly.  Monitor any moles. Be sure to tell your health care provider: ? About any new moles or changes in moles, especially if there is a change in a mole's shape or color. ? If you have a mole that is larger than the size of a pencil eraser.  If any of your  family members has a history of skin cancer, especially at a young age, talk with your health care provider about genetic screening.  Always use sunscreen. Apply sunscreen liberally and repeatedly throughout the day.  Whenever you are outside, protect yourself by wearing long sleeves, pants, a wide-brimmed hat, and sunglasses.  What should I know about osteoporosis? Osteoporosis is a condition in which bone destruction happens more quickly than new bone creation. After menopause, you may be at an increased risk for osteoporosis. To help prevent osteoporosis or the bone fractures that can happen because of osteoporosis, the following is recommended:  If you are 45-61 years old, get at least 1,000 mg of calcium and at least 600 mg  of vitamin D per day.  If you are older than age 15 but younger than age 10, get at least 1,200 mg of calcium and at least 600 mg of vitamin D per day.  If you are older than age 83, get at least 1,200 mg of calcium and at least 800 mg of vitamin D per day.  Smoking and excessive alcohol intake increase the risk of osteoporosis. Eat foods that are rich in calcium and vitamin D, and do weight-bearing exercises several times each week as directed by your health care provider. What should I know about how menopause affects my mental health? Depression may occur at any age, but it is more common as you become older. Common symptoms of depression include:  Low or sad mood.  Changes in sleep patterns.  Changes in appetite or eating patterns.  Feeling an overall lack of motivation or enjoyment of activities that you previously enjoyed.  Frequent crying spells.  Talk with your health care provider if you think that you are experiencing depression. What should I know about immunizations? It is important that you get and maintain your immunizations. These include:  Tetanus, diphtheria, and pertussis (Tdap) booster vaccine.  Influenza every year before the flu season  begins.  Pneumonia vaccine.  Shingles vaccine.  Your health care provider may also recommend other immunizations. This information is not intended to replace advice given to you by your health care provider. Make sure you discuss any questions you have with your health care provider. Document Released: 11/29/2005 Document Revised: 04/26/2016 Document Reviewed: 07/11/2015 Elsevier Interactive Patient Education  2018 [employer].

## 2018-03-12 ENCOUNTER — Telehealth: Payer: Self-pay | Admitting: Family Medicine

## 2018-03-12 DIAGNOSIS — M8580 Other specified disorders of bone density and structure, unspecified site: Secondary | ICD-10-CM

## 2018-03-12 NOTE — Telephone Encounter (Signed)
Copied from [place] [phone]. Topic: Quick Communication - See Telephone Encounter >> Mar 12, 2018 10:32 AM Ether Griffins B wrote: CRM for notification. See Telephone encounter for: 03/12/18.  Allie with [facility] calling stating they need a new order for the bone density and new diagnosis code. They can use osteopenia or estrogen deficiency.

## 2018-03-13 ENCOUNTER — Other Ambulatory Visit: Payer: Self-pay | Admitting: Family Medicine

## 2018-03-13 DIAGNOSIS — Z139 Encounter for screening, unspecified: Secondary | ICD-10-CM

## 2018-03-13 NOTE — Telephone Encounter (Signed)
Order placed with correct dx

## 2018-03-25 DIAGNOSIS — Z1211 Encounter for screening for malignant neoplasm of colon: Secondary | ICD-10-CM | POA: Diagnosis not present

## 2018-03-25 DIAGNOSIS — K635 Polyp of colon: Secondary | ICD-10-CM | POA: Diagnosis not present

## 2018-03-25 DIAGNOSIS — K5730 Diverticulosis of large intestine without perforation or abscess without bleeding: Secondary | ICD-10-CM | POA: Diagnosis not present

## 2018-03-25 DIAGNOSIS — K648 Other hemorrhoids: Secondary | ICD-10-CM | POA: Diagnosis not present

## 2018-03-25 DIAGNOSIS — D124 Benign neoplasm of descending colon: Secondary | ICD-10-CM | POA: Diagnosis not present

## 2018-03-25 DIAGNOSIS — Z86010 Personal history of colonic polyps: Secondary | ICD-10-CM | POA: Diagnosis not present

## 2018-03-25 DIAGNOSIS — Z8371 Family history of colonic polyps: Secondary | ICD-10-CM | POA: Diagnosis not present

## 2018-05-05 ENCOUNTER — Other Ambulatory Visit: Payer: Self-pay | Admitting: *Deleted

## 2018-05-05 MED ORDER — ROSUVASTATIN CALCIUM 5 MG PO TABS: ORAL_TABLET | ORAL | 1 refills | Status: DC

## 2018-05-07 DIAGNOSIS — R748 Abnormal levels of other serum enzymes: Secondary | ICD-10-CM | POA: Diagnosis not present

## 2018-05-25 ENCOUNTER — Other Ambulatory Visit: Payer: Self-pay | Admitting: Family Medicine

## 2018-06-17 ENCOUNTER — Ambulatory Visit
Admission: RE | Admit: 2018-06-17 | Discharge: 2018-06-17 | Disposition: A | Discharge status: Home / Self Care | Payer: Medicare HMO | Source: Ambulatory Visit | Attending: Family Medicine | Admitting: Family Medicine

## 2018-06-17 DIAGNOSIS — Z780 Asymptomatic menopausal state: Secondary | ICD-10-CM | POA: Diagnosis not present

## 2018-06-17 DIAGNOSIS — M8580 Other specified disorders of bone density and structure, unspecified site: Secondary | ICD-10-CM

## 2018-06-17 DIAGNOSIS — M8589 Other specified disorders of bone density and structure, multiple sites: Secondary | ICD-10-CM | POA: Diagnosis not present

## 2018-06-17 DIAGNOSIS — Z1231 Encounter for screening mammogram for malignant neoplasm of breast: Secondary | ICD-10-CM | POA: Diagnosis not present

## 2018-06-17 DIAGNOSIS — Z139 Encounter for screening, unspecified: Secondary | ICD-10-CM

## 2018-06-17 IMAGING — MG DIGITAL SCREENING BILATERAL MAMMOGRAM WITH TOMO AND CAD
6 of 12 series · 6 of 36 positions shown · non-contrast
Comparison: Previous exam(s).

CLINICAL DATA: Screening.

EXAM:
DIGITAL SCREENING BILATERAL MAMMOGRAM WITH TOMO AND CAD

[L MLO synth-2D (1 of 2)]
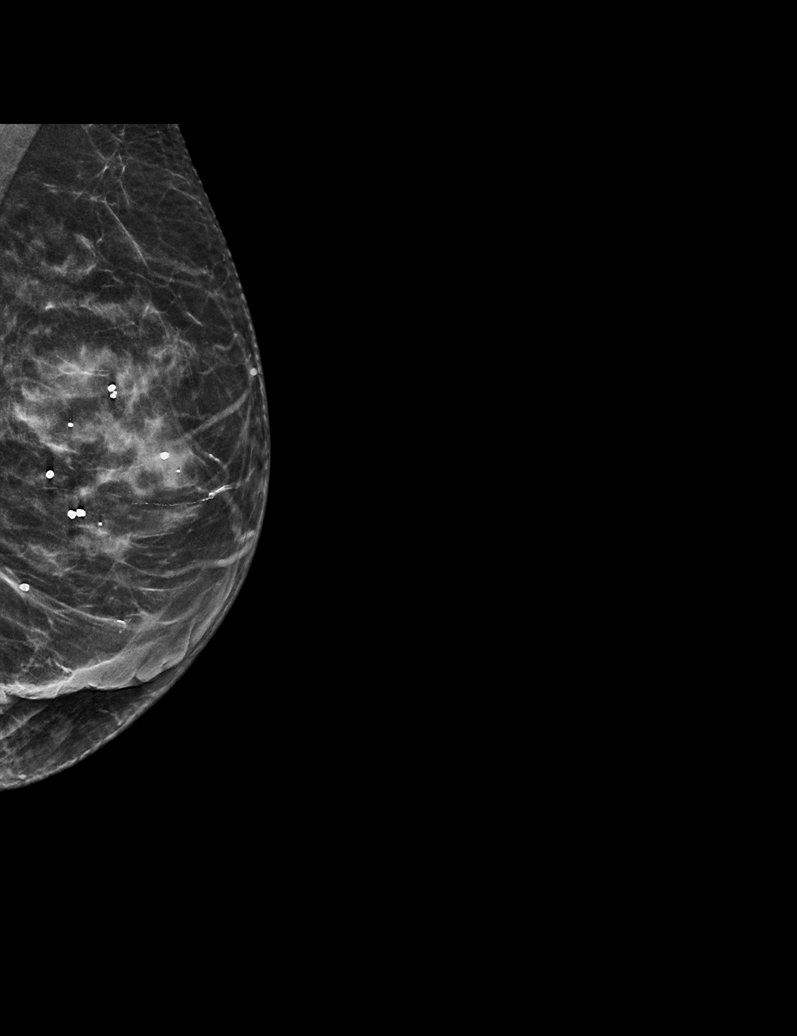

[L MLO synth-2D (2 of 2)]
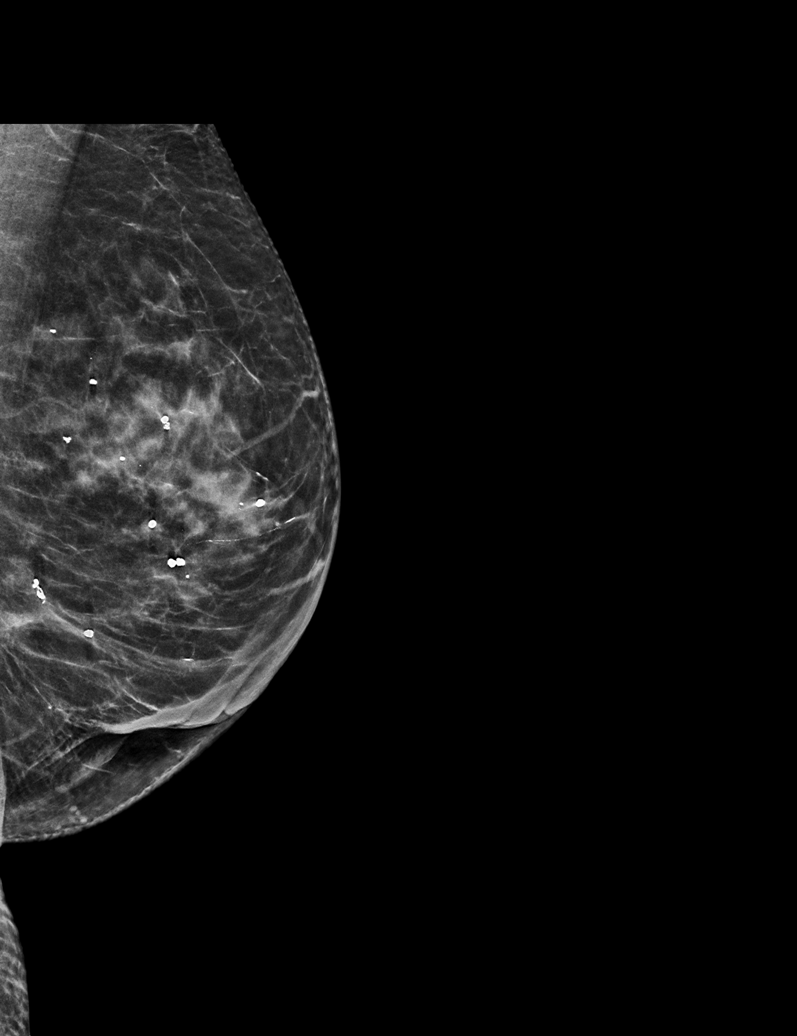

[R CC synth-2D (1 of 2)]
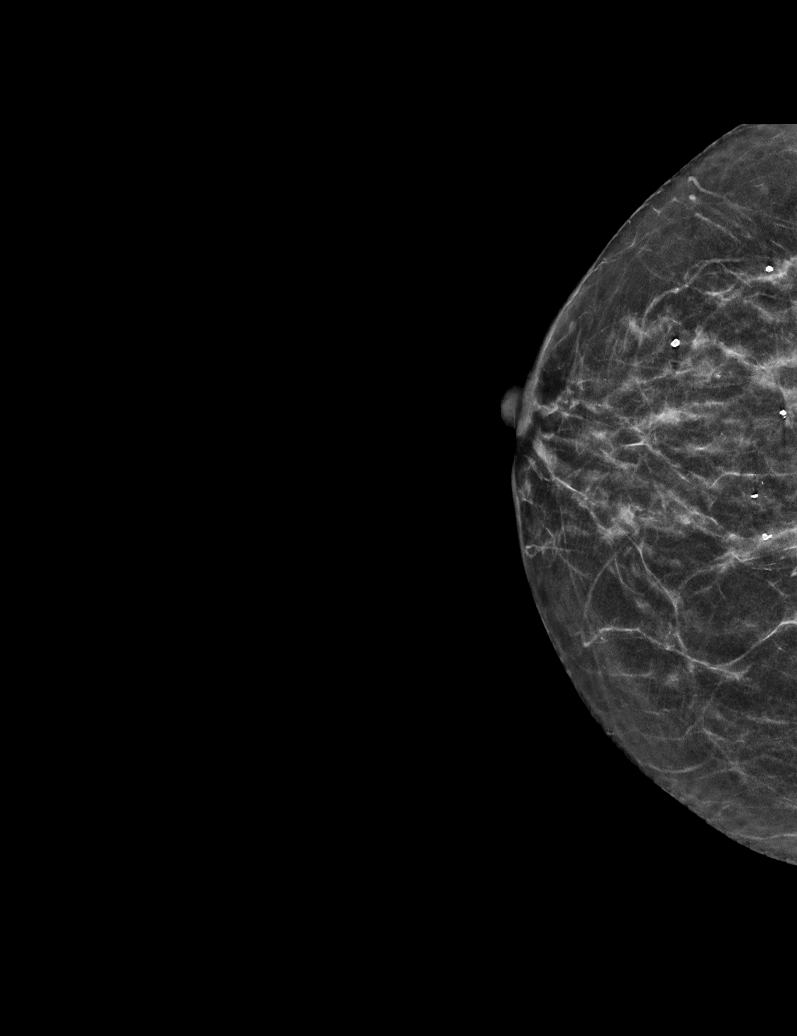

[R MLO synth-2D]
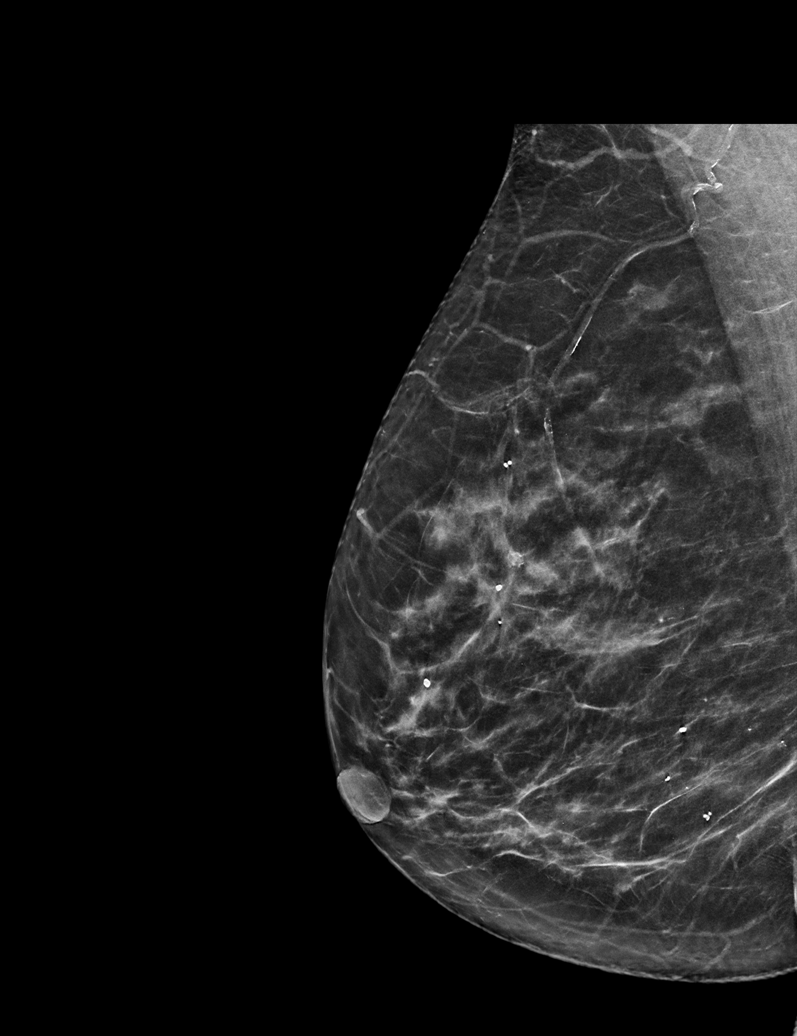

[R CC synth-2D (2 of 2)]
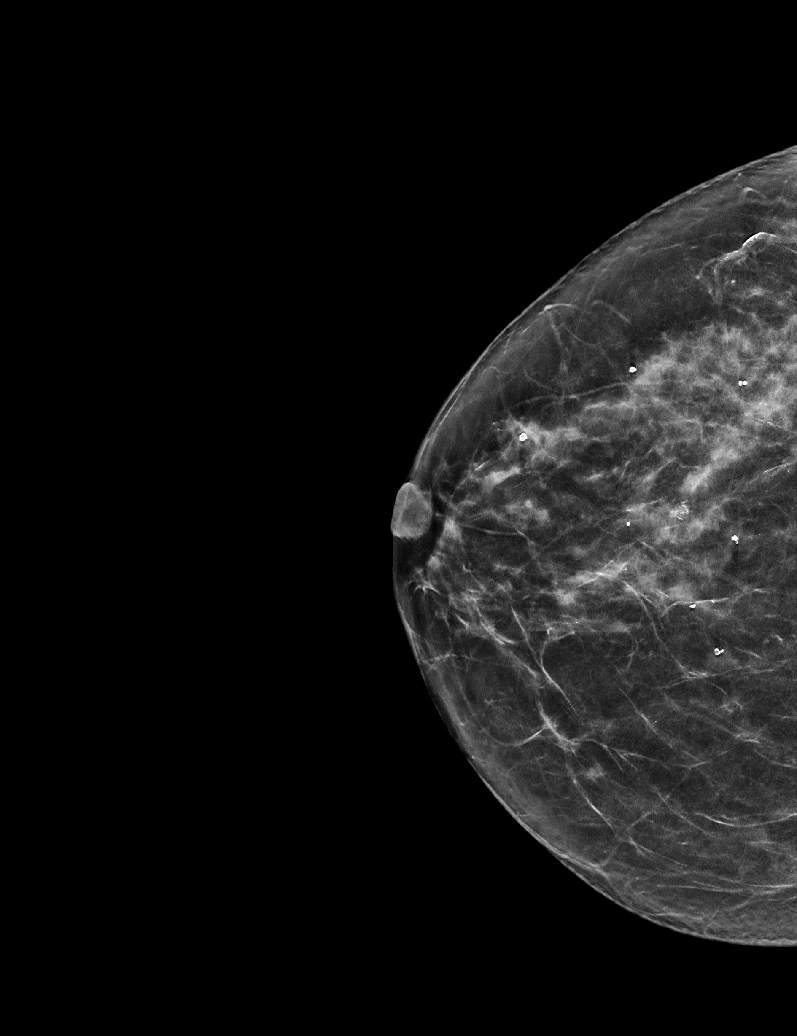

[L CC synth-2D]
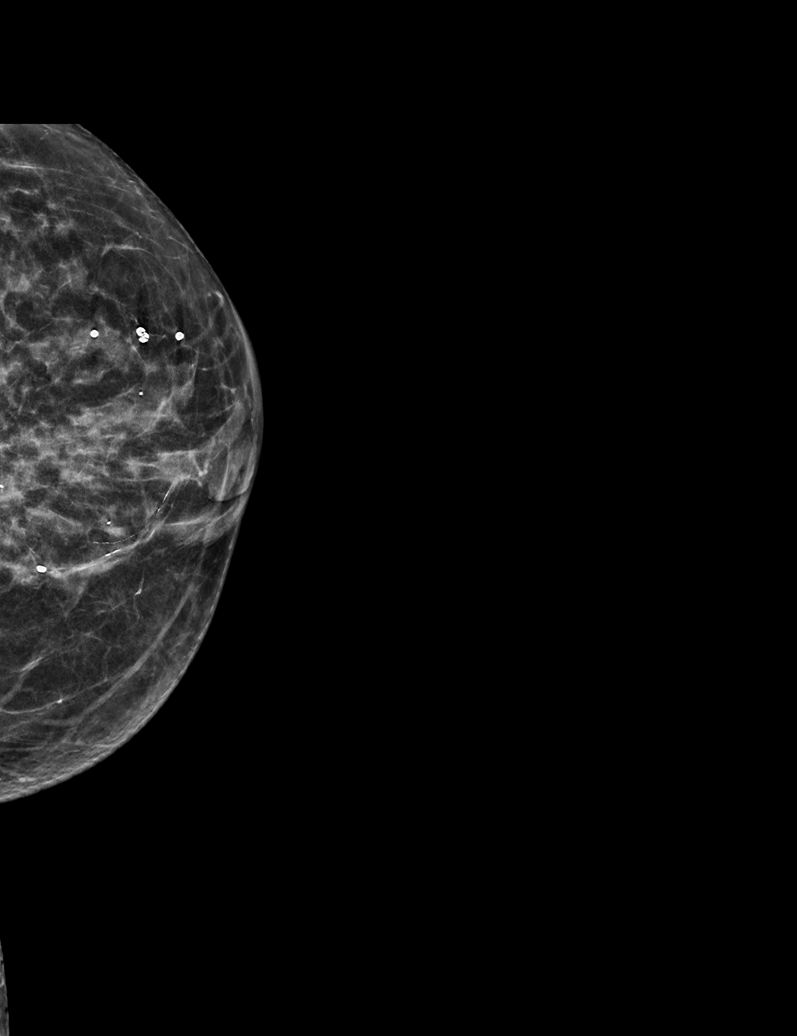

[6 of 36 positions shown; findings below may reference images not displayed]

ACR Breast Density Category c: The breast tissue is heterogeneously
dense, which may obscure small masses.
FINDINGS: In the right breast, calcifications warrant further evaluation with
magnified views. In the left breast, no findings suspicious for
malignancy. Images were processed with CAD.
IMPRESSION: Further evaluation is suggested for calcifications in the right
breast.

RECOMMENDATION:
Diagnostic mammogram of the right breast. (Code:[DS])

The patient will be contacted regarding the findings, and additional
imaging will be scheduled.

BI-RADS CATEGORY  0: Incomplete. Need additional imaging evaluation
and/or prior mammograms for comparison.

## 2018-06-18 ENCOUNTER — Encounter: Payer: Self-pay | Admitting: Family Medicine

## 2018-06-18 ENCOUNTER — Other Ambulatory Visit: Payer: Self-pay | Admitting: Family Medicine

## 2018-06-18 DIAGNOSIS — R921 Mammographic calcification found on diagnostic imaging of breast: Secondary | ICD-10-CM

## 2018-06-23 ENCOUNTER — Ambulatory Visit
Admission: RE | Admit: 2018-06-23 | Discharge: 2018-06-23 | Disposition: A | Discharge status: Home / Self Care | Payer: Medicare HMO | Source: Ambulatory Visit | Attending: Family Medicine | Admitting: Family Medicine

## 2018-06-23 DIAGNOSIS — R921 Mammographic calcification found on diagnostic imaging of breast: Secondary | ICD-10-CM

## 2018-06-23 HISTORY — DX: Personal history of irradiation: Z92.3

## 2018-06-23 HISTORY — DX: Personal history of antineoplastic chemotherapy: Z92.21

## 2018-06-23 IMAGING — MG DIGITAL DIAGNOSTIC UNILATERAL RIGHT MAMMOGRAM
3 series · 3 of 3 positions shown · non-contrast
Comparison: Previous exam(s).

CLINICAL DATA: Screening recall for right breast calcifications.

EXAM:
DIGITAL DIAGNOSTIC RIGHT MAMMOGRAM

[R ML (1 of 2)]
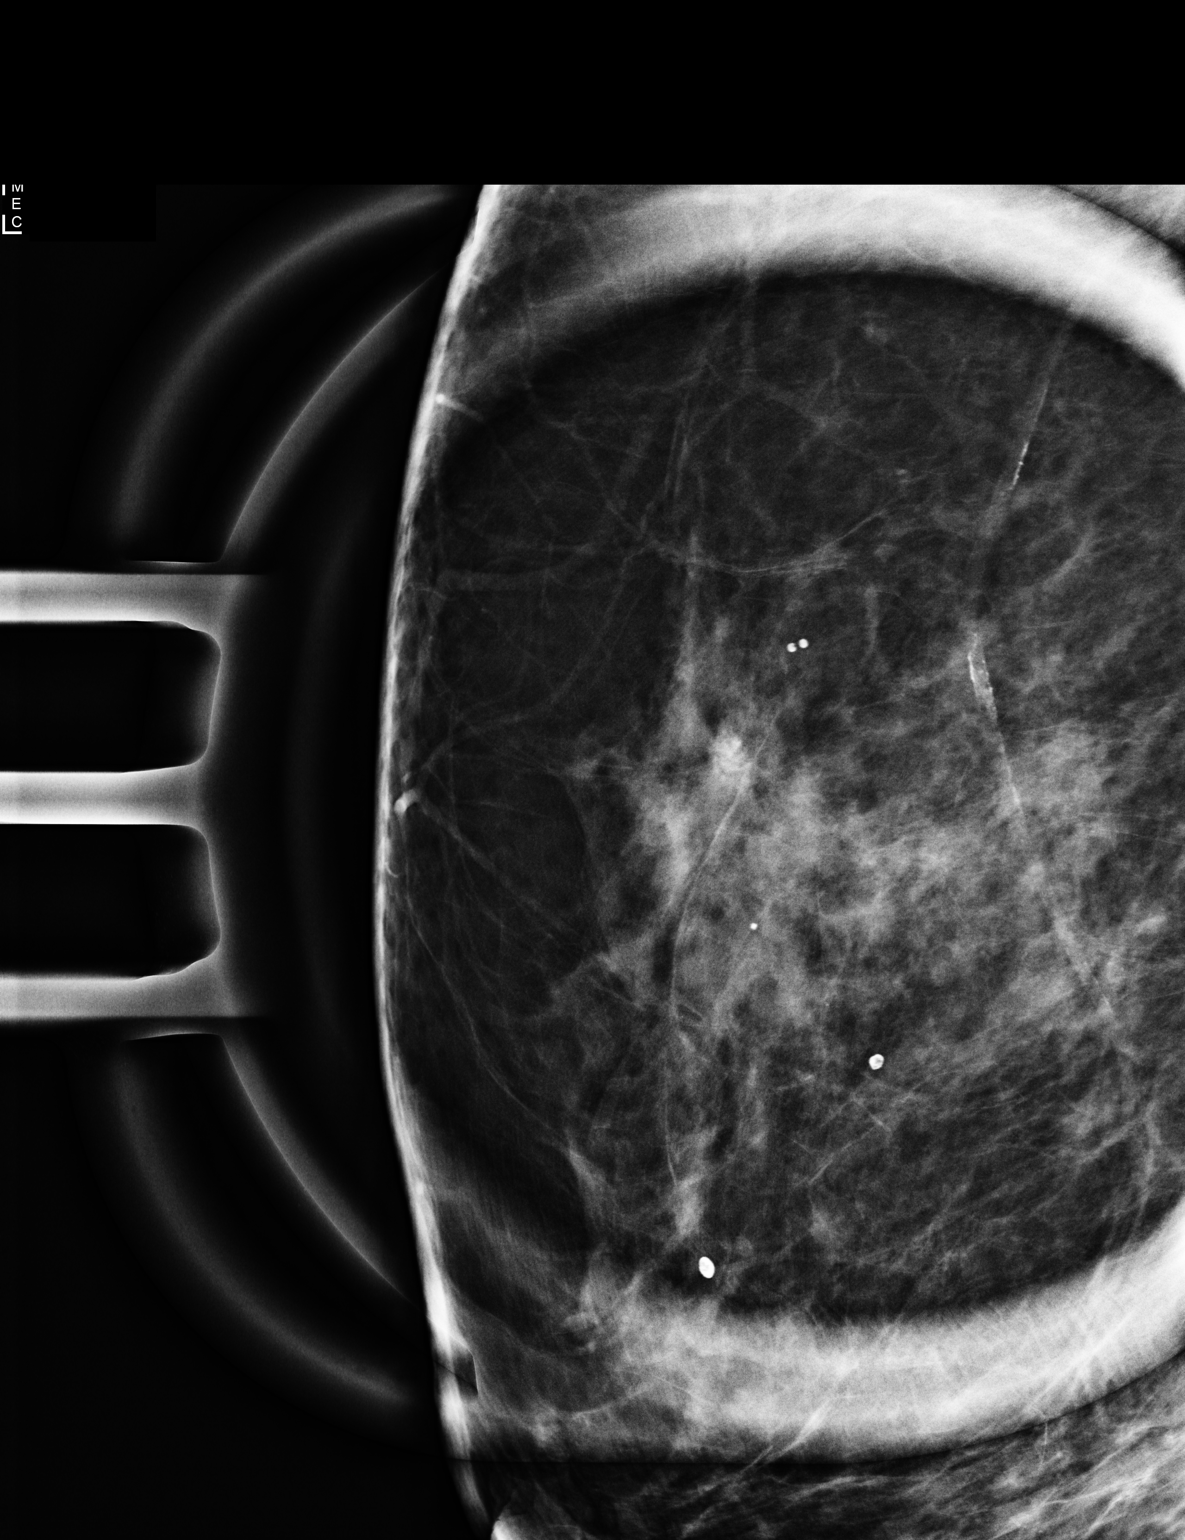

[R CC]
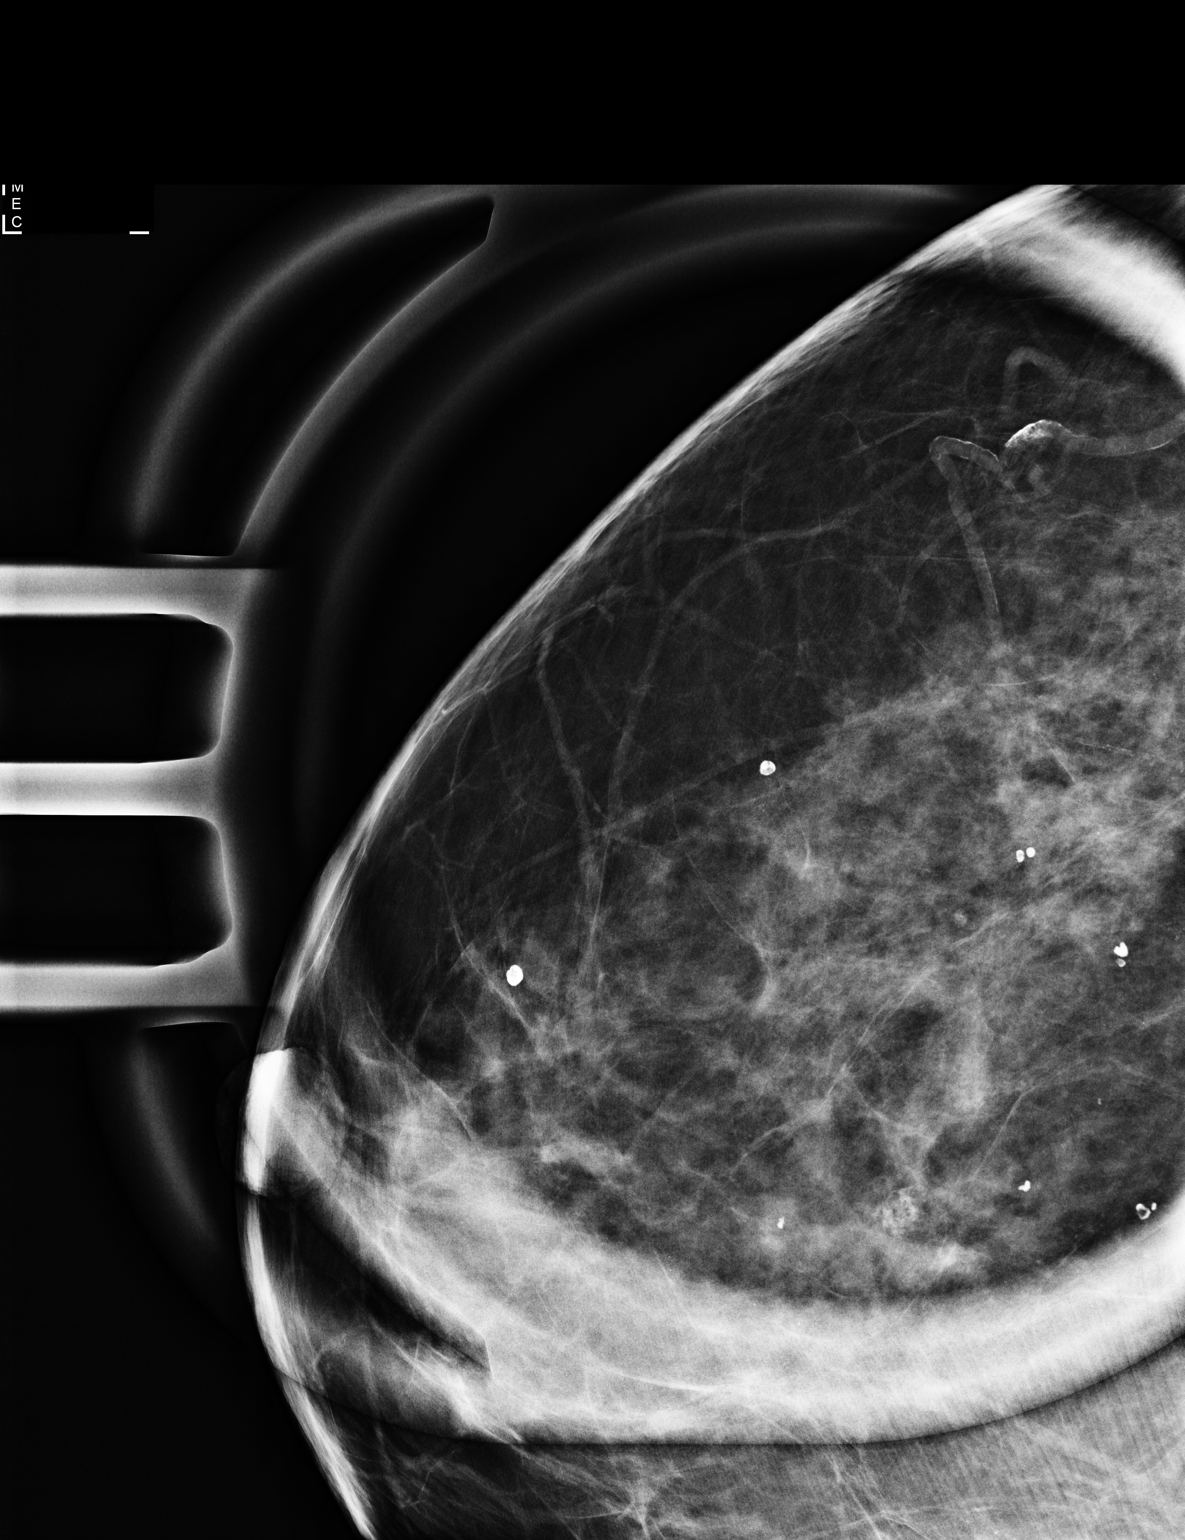

[R ML (2 of 2)]
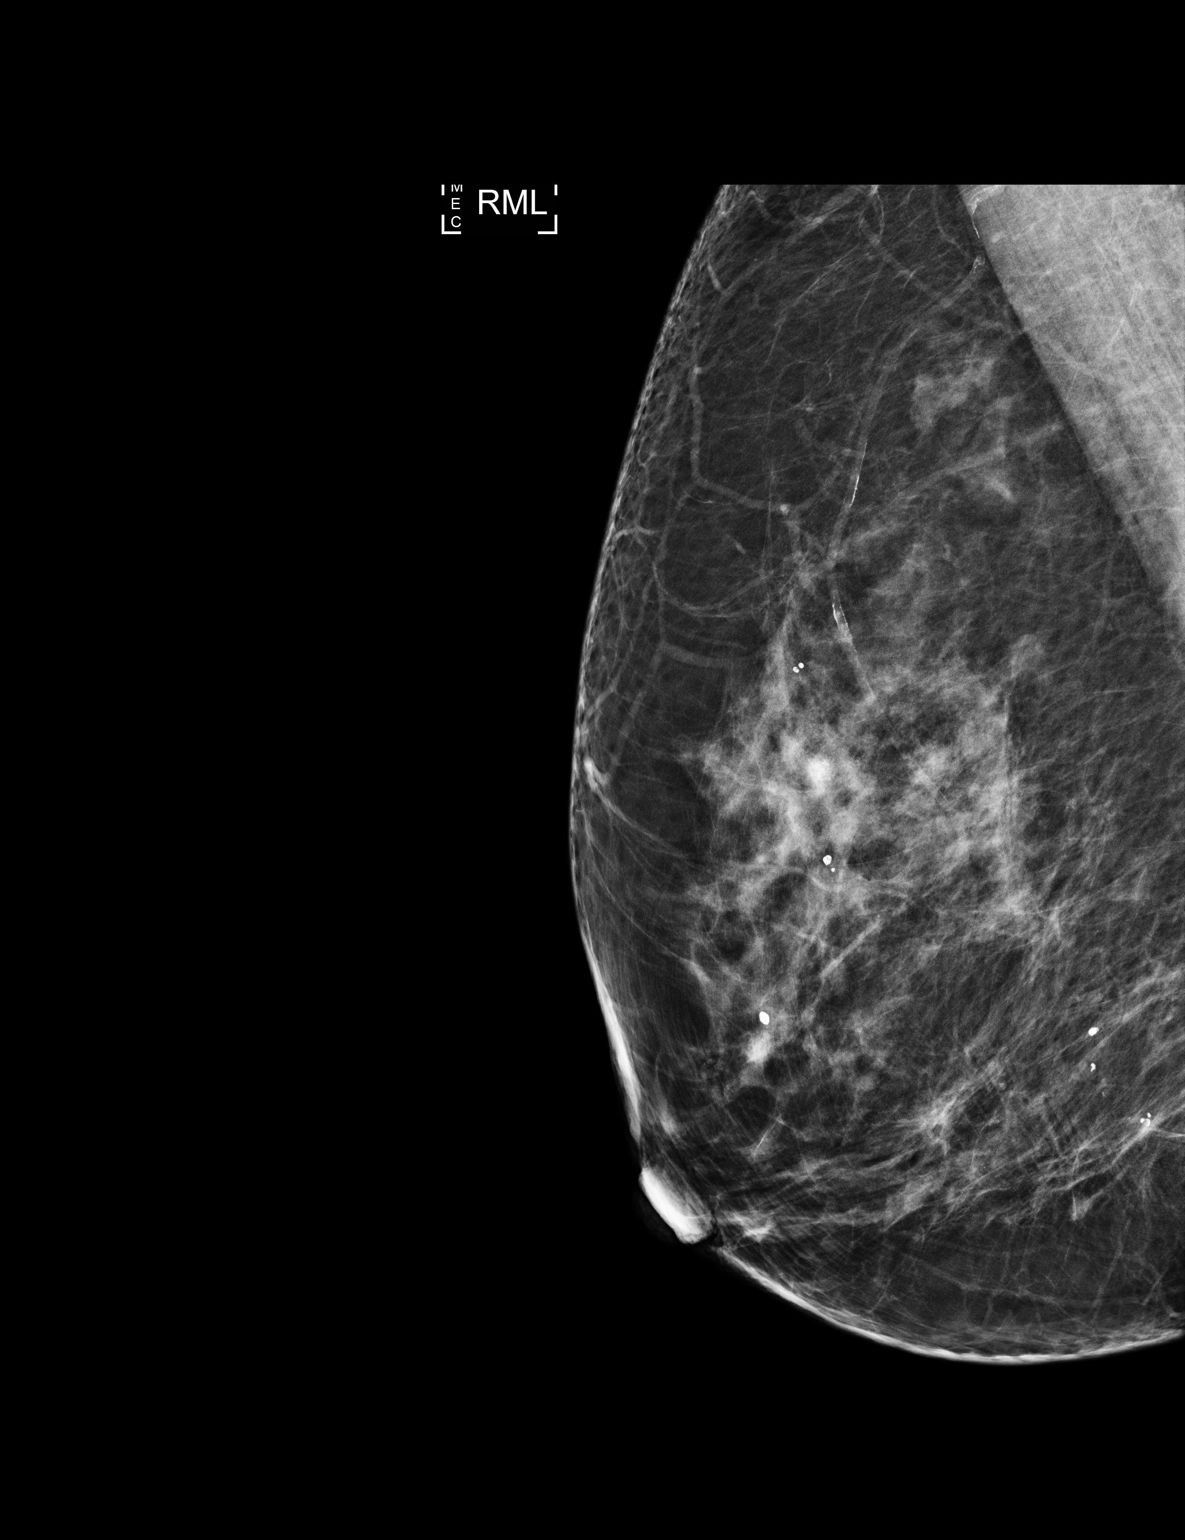

[3 of 3 positions shown; findings below may reference images not displayed]

ACR Breast Density Category c: The breast tissue is heterogeneously
dense, which may obscure small masses.
FINDINGS: On the diagnostic magnification 2D images and the 2D diagnostic mL
view, calcifications noted on the current screening 3D study are not
visualized. There is a small area relative increased density in this
location spanning 3 mm. This has mixed attenuation. On prior exams,
there was a cyst with rim calcification consistent with a fat
necrosis oil cyst in the same location, measuring 4 mm on the study
dated [DATE]. The current findings are consistent with evolution
of fat necrosis. Since lesion is smaller, mixed attenuation, and
since there are no true calcifications, no additional evaluation is
recommended.
IMPRESSION: 1. No evidence of breast malignancy.
2. Small benign area of fat necrosis decreased in size since [A3].

RECOMMENDATION:
Screening mammogram in one year.(Code:[A3])

I have discussed the findings and recommendations with the patient.
Results were also provided in writing at the conclusion of the
visit. If applicable, a reminder letter will be sent to the patient
regarding the next appointment.

BI-RADS CATEGORY  2: Benign.

## 2018-06-23 MED ORDER — ALENDRONATE SODIUM 70 MG PO TABS: 70.0000 mg | ORAL_TABLET | ORAL | 3 refills | Status: DC

## 2018-07-09 ENCOUNTER — Ambulatory Visit (INDEPENDENT_AMBULATORY_CARE_PROVIDER_SITE_OTHER): Payer: Medicare HMO | Admitting: Family Medicine

## 2018-07-09 ENCOUNTER — Encounter: Payer: Self-pay | Admitting: Family Medicine

## 2018-07-09 VITALS — BP 121/72 | HR 97 | Temp 98.9°F | Resp 16 | Ht 63.5 in | Wt 154.0 lb

## 2018-07-09 DIAGNOSIS — E1139 Type 2 diabetes mellitus with other diabetic ophthalmic complication: Secondary | ICD-10-CM

## 2018-07-09 DIAGNOSIS — I10 Essential (primary) hypertension: Secondary | ICD-10-CM | POA: Diagnosis not present

## 2018-07-09 DIAGNOSIS — Z0000 Encounter for general adult medical examination without abnormal findings: Secondary | ICD-10-CM | POA: Diagnosis not present

## 2018-07-09 DIAGNOSIS — J45909 Unspecified asthma, uncomplicated: Secondary | ICD-10-CM | POA: Diagnosis not present

## 2018-07-09 DIAGNOSIS — E785 Hyperlipidemia, unspecified: Secondary | ICD-10-CM

## 2018-07-09 DIAGNOSIS — Z23 Encounter for immunization: Secondary | ICD-10-CM

## 2018-07-09 LAB — COMPREHENSIVE METABOLIC PANEL
ALT: 22 U/L (ref 0–35)
AST: 21 U/L (ref 0–37)
Albumin: 3.9 g/dL (ref 3.5–5.2)
Alkaline Phosphatase: 45 U/L (ref 39–117)
BUN: 10 mg/dL (ref 6–23)
CO2: 30 mEq/L (ref 19–32)
Calcium: 9.1 mg/dL (ref 8.4–10.5)
Chloride: 102 mEq/L (ref 96–112)
Creatinine, Ser: 0.67 mg/dL (ref 0.40–1.20)
GFR: 91.89 mL/min (ref >60.00)
Glucose, Bld: 134 mg/dL — ABNORMAL HIGH (ref 70–99)
Potassium: 4.4 mEq/L (ref 3.5–5.1)
Sodium: 141 mEq/L (ref 135–145)
Total Bilirubin: 0.4 mg/dL (ref 0.2–1.2)
Total Protein: 6.7 g/dL (ref 6.0–8.3)

## 2018-07-09 LAB — LIPID PANEL
Cholesterol: 102 mg/dL (ref 0–200)
HDL: 32.8 mg/dL — ABNORMAL LOW (ref >39.00)
LDL Cholesterol: 48 mg/dL (ref 0–99)
NonHDL: 69.69
Total CHOL/HDL Ratio: 3
Triglycerides: 108 mg/dL (ref 0.0–149.0)
VLDL: 21.6 mg/dL (ref 0.0–40.0)

## 2018-07-09 LAB — HEMOGLOBIN A1C: Hgb A1c MFr Bld: 6.9 % — ABNORMAL HIGH (ref 4.6–6.5)

## 2018-07-09 MED ORDER — ALBUTEROL SULFATE (2.5 MG/3ML) 0.083% IN NEBU
2.5000 mg | INHALATION_SOLUTION | Freq: Once | RESPIRATORY_TRACT | Status: AC
  Administered 2018-07-09: 2.5 mg via RESPIRATORY_TRACT

## 2018-07-09 NOTE — Patient Instructions (Signed)

## 2018-07-09 NOTE — Progress Notes (Signed)
Subjective:     Michelle Long is a 72 y.o. female and is here for a comprehensive physical exam. The patient reports problems - cough since sunday   she is taking robitussin sugar free with some help.  her 40 yo granddaugter is sick.  .  Social History   Socioeconomic History  . Marital status: Single    Spouse name: Not on file  . Number of children: Not on file  . Years of education: Not on file  . Highest education level: Not on file  Occupational History    Comment: retired  Scientific laboratory technician  . Financial resource strain: Not on file  . Food insecurity:    Worry: Not on file    Inability: Not on file  . Transportation needs:    Medical: Not on file    Non-medical: Not on file  Tobacco Use  . Smoking status: Former Smoker    Packs/day: 1.50    Years: 33.00    Pack years: 49.50    Types: Cigarettes    Start date: 12/08/1964    Last attempt to quit: 08/21/1998    Years since quitting: 19.8  . Smokeless tobacco: Never Used  . Tobacco comment: quit 16 years ago  Substance and Sexual Activity  . Alcohol use: Yes    Alcohol/week: 0.0 standard drinks    Comment: very rarely  . Drug use: No  . Sexual activity: Not Currently    Partners: Male  Lifestyle  . Physical activity:    Days per week: Not on file    Minutes per session: Not on file  . Stress: Not on file  Relationships  . Social connections:    Talks on phone: Not on file    Gets together: Not on file    Attends religious service: Not on file    Active member of club or organization: Not on file    Attends meetings of clubs or organizations: Not on file    Relationship status: Not on file  . Intimate partner violence:    Fear of current or ex partner: Not on file    Emotionally abused: Not on file    Physically abused: Not on file    Forced sexual activity: Not on file  Other Topics Concern  . Not on file  Social History Narrative   Exercise--- walks with neighbor   Health Maintenance  Topic Date Due  .  COLONOSCOPY  01/15/2018  . INFLUENZA VACCINE  05/21/2018  . HEMOGLOBIN A1C  07/04/2018  . OPHTHALMOLOGY EXAM  08/04/2018  . FOOT EXAM  01/02/2019  . MAMMOGRAM  06/23/2020  . TETANUS/TDAP  09/19/2025  . DEXA SCAN  Completed  . Hepatitis C Screening  Completed  . PNA vac Low Risk Adult  Completed    The following portions of the patient's history were reviewed and updated as appropriate: allergies, current medications, past family history, past medical history, past social history, past surgical history and problem list.  Review of Systems   Objective:    BP 121/72 (BP Location: Right Arm, Patient Position: Sitting, Cuff Size: Small)   Pulse 97   Temp 98.9 F (37.2 C) (Oral)   Resp 16   Ht 5' 3.5" (1.613 m)   Wt 154 lb (69.9 kg)   SpO2 96%   BMI 26.85 kg/m  General appearance: alert, cooperative, appears stated age and no distress Head: Normocephalic, without obvious abnormality, atraumatic Eyes: negative findings: lids and lashes normal, conjunctivae and sclerae normal  and pupils equal, round, reactive to light and accomodation Ears: normal TM's and external ear canals both ears Nose: Nares normal. Septum midline. Mucosa normal. No drainage or sinus tenderness. Throat: lips, mucosa, and tongue normal; teeth and gums normal Neck: no adenopathy, no carotid bruit, no JVD, supple, symmetrical, trachea midline and thyroid not enlarged, symmetric, no tenderness/mass/nodules Back: symmetric, no curvature. ROM normal. No CVA tenderness. Lungs: wheezes bilaterally Breasts: normal appearance, no masses or tenderness Heart: regular rate and rhythm, S1, S2 normal, no murmur, click, rub or gallop Abdomen: soft, non-tender; bowel sounds normal; no masses,  no organomegaly Pelvic: not indicated; status post hysterectomy, negative ROS Extremities: extremities normal, atraumatic, no cyanosis or edema Pulses: 2+ and symmetric Skin: Skin color, texture, turgor normal. No rashes or  lesions Lymph nodes: Cervical, supraclavicular, and axillary nodes normal. Neurologic: Alert and oriented X 3, normal strength and tone. Normal symmetric reflexes. Normal coordination and gait    Assessment:    Healthy female exam.      Plan:    ghm utd Check labs See After Visit Summary for Counseling Recommendations    1. Type 2 diabetes mellitus with other ophthalmic complication, without long-term current use of insulin (HCC) hgba1c to be done, minimize simple carbs. Increase exercise as tolerated. Continue current meds  - Hemoglobin A1c - Lipid panel - Comprehensive metabolic panel  2. Essential hypertension Well controlled, no changes to meds. Encouraged heart healthy diet such as the DASH diet and exercise as tolerated.  - Hemoglobin A1c - Lipid panel - Comprehensive metabolic panel  3. Hyperlipidemia LDL goal <70 Tolerating statin, encouraged heart healthy diet, avoid trans fats, minimize simple carbs and saturated fats. Increase exercise as tolerated - Hemoglobin A1c - Lipid panel - Comprehensive metabolic panel  4. Preventative health care See above  5. Needs flu shot   - Flu vaccine HIGH DOSE PF (Fluzone High dose)  6. Uncomplicated asthma, unspecified asthma severity, unspecified whether persistent stable - albuterol (PROVENTIL) (2.5 MG/3ML) 0.083% nebulizer solution 2.5 mg

## 2018-07-10 ENCOUNTER — Inpatient Hospital Stay: Payer: Medicare HMO | Attending: Hematology & Oncology | Admitting: Hematology & Oncology

## 2018-07-10 ENCOUNTER — Other Ambulatory Visit: Payer: Self-pay

## 2018-07-10 ENCOUNTER — Encounter: Payer: Self-pay | Admitting: Hematology & Oncology

## 2018-07-10 ENCOUNTER — Inpatient Hospital Stay: Payer: Medicare HMO

## 2018-07-10 VITALS — BP 139/64 | HR 110 | Temp 98.3°F | Resp 16 | Wt 157.0 lb

## 2018-07-10 DIAGNOSIS — J4521 Mild intermittent asthma with (acute) exacerbation: Secondary | ICD-10-CM | POA: Insufficient documentation

## 2018-07-10 DIAGNOSIS — Z79899 Other long term (current) drug therapy: Secondary | ICD-10-CM | POA: Diagnosis not present

## 2018-07-10 DIAGNOSIS — Z7984 Long term (current) use of oral hypoglycemic drugs: Secondary | ICD-10-CM | POA: Insufficient documentation

## 2018-07-10 DIAGNOSIS — Z853 Personal history of malignant neoplasm of breast: Secondary | ICD-10-CM | POA: Diagnosis not present

## 2018-07-10 DIAGNOSIS — Z9223 Personal history of estrogen therapy: Secondary | ICD-10-CM | POA: Diagnosis not present

## 2018-07-10 DIAGNOSIS — Z170 Estrogen receptor positive status [ER+]: Secondary | ICD-10-CM | POA: Insufficient documentation

## 2018-07-10 DIAGNOSIS — E559 Vitamin D deficiency, unspecified: Secondary | ICD-10-CM

## 2018-07-10 DIAGNOSIS — C50312 Malignant neoplasm of lower-inner quadrant of left female breast: Secondary | ICD-10-CM

## 2018-07-10 LAB — CMP (CANCER CENTER ONLY)
ALT: 35 U/L (ref 10–47)
AST: 36 U/L (ref 11–38)
Albumin: 3.6 g/dL (ref 3.5–5.0)
Alkaline Phosphatase: 58 U/L (ref 26–84)
Anion gap: 8 (ref 5–15)
BUN: 9 mg/dL (ref 7–22)
CO2: 28 mmol/L (ref 18–33)
Calcium: 9.3 mg/dL (ref 8.0–10.3)
Chloride: 106 mmol/L (ref 98–108)
Creatinine: 0.9 mg/dL (ref 0.60–1.20)
Glucose, Bld: 136 mg/dL — ABNORMAL HIGH (ref 73–118)
Potassium: 4.4 mmol/L (ref 3.3–4.7)
Sodium: 142 mmol/L (ref 128–145)
Total Bilirubin: 0.5 mg/dL (ref 0.2–1.6)
Total Protein: 7.6 g/dL (ref 6.4–8.1)

## 2018-07-10 LAB — CBC WITH DIFFERENTIAL (CANCER CENTER ONLY)
Basophils Absolute: 0 10*3/uL (ref 0.0–0.1)
Basophils Relative: 0 %
Eosinophils Absolute: 0.1 10*3/uL (ref 0.0–0.5)
Eosinophils Relative: 2 %
HCT: 42.7 % (ref 34.8–46.6)
Hemoglobin: 13.7 g/dL (ref 11.6–15.9)
Lymphocytes Relative: 18 %
Lymphs Abs: 1.3 10*3/uL (ref 0.9–3.3)
MCH: 29.8 pg (ref 26.0–34.0)
MCHC: 32.1 g/dL (ref 32.0–36.0)
MCV: 92.8 fL (ref 81.0–101.0)
Monocytes Absolute: 0.8 10*3/uL (ref 0.1–0.9)
Monocytes Relative: 11 %
Neutro Abs: 4.9 10*3/uL (ref 1.5–6.5)
Neutrophils Relative %: 69 %
Platelet Count: 200 10*3/uL (ref 145–400)
RBC: 4.6 MIL/uL (ref 3.70–5.32)
RDW: 13 % (ref 11.1–15.7)
WBC Count: 7.2 10*3/uL (ref 3.9–10.0)

## 2018-07-10 MED ORDER — IPRATROPIUM-ALBUTEROL 0.5-2.5 (3) MG/3ML IN SOLN
RESPIRATORY_TRACT | Status: AC
  Filled 2018-07-10: qty 3

## 2018-07-10 MED ORDER — IPRATROPIUM-ALBUTEROL 0.5-2.5 (3) MG/3ML IN SOLN
3.0000 mL | Freq: Once | RESPIRATORY_TRACT | Status: AC
  Administered 2018-07-10: 3 mL via RESPIRATORY_TRACT

## 2018-07-10 NOTE — Progress Notes (Signed)
Hematology and Oncology Follow Up Visit  ANGLINE SCHWEIGERT [id number] 07-30-1946 72 y.o. 07/10/2018   Principle Diagnosis:  Stage IIA (T2 N0M0) infiltrating ductal carcinoma of the left breast  Current Therapy:   Tamoxifen 20 mg by mouth daily - finished in September 2017    Interim History:  Ms. Ekdahl is here for follow-up. We see her every 6 months.  She is having some problems with asthma.  Apparently this was a tough summer for her.  We will go ahead and give her a nebulizer today.  Otherwise, she seems to be doing pretty well.  She went to [place] for part of the summer.  She enjoyed herself.  She is had no new medicines.  She has had no fever.  She is had no bleeding.  There is been no change in bowel or bladder habits.  Her last mammogram was 2 weeks ago.  Everything looked fine on the mammogram.  Overall, her performance status today is ECOG 1.    Medications:  Allergies as of 07/10/2018      Reactions   Lamictal [lamotrigine]    itch   Prozac [fluoxetine Hcl]    seizure   Statins Other (See Comments)   Muscle pains   Sulfa Drugs Cross Reactors    Dyspnea      Medication List        Accurate as of 07/10/18  1:39 PM. Always use your most recent med list.          alendronate 70 MG tablet Commonly known as:  FOSAMAX Take 1 tablet (70 mg total) by mouth every 7 (seven) days. Take with a full glass of water on an empty stomach.   aspirin 81 MG tablet Take 81 mg by mouth daily.   b complex vitamins tablet Take 1 tablet by mouth daily.   citalopram 10 MG tablet Commonly known as:  CELEXA Take 0.5 tablets (5 mg total) by mouth at bedtime. Taking 5 mg. Only.   ezetimibe 10 MG tablet Commonly known as:  ZETIA TAKE 1 TABLET EVERY DAY   fish oil-omega-3 fatty acids 1000 MG capsule Take 1 g by mouth daily.   Lancets Misc. Misc Trueplus 33g lancets  Use as directed once a day   LORazepam 0.5 MG tablet Commonly known as:  ATIVAN Take 1 tablet (0.5 mg total)  by mouth 2 (two) times daily as needed.   losartan 50 MG tablet Commonly known as:  COZAAR TAKE 1 TABLET EVERY DAY   metFORMIN 1000 MG tablet Commonly known as:  GLUCOPHAGE Take 1 tablet (1,000 mg total) by mouth 2 (two) times daily with a meal.   multivitamin tablet Take 1 tablet by mouth daily.   OLANZapine 5 MG tablet Commonly known as:  ZYPREXA Take 5 mg by mouth daily.   pantoprazole 40 MG tablet Commonly known as:  PROTONIX TAKE 1 TABLET EVERY DAY   rosuvastatin 5 MG tablet Commonly known as:  CRESTOR Take 1 tablet by mouth 3 times a week   Spacer/Aero Chamber Mouthpiece Misc To use with inhaler   SYMBICORT 160-4.5 MCG/ACT inhaler Generic drug:  budesonide-formoterol INHALE 2 PUFFS TWICE DAILY   TRUE METRIX AIR GLUCOSE METER Devi   TRUE METRIX BLOOD GLUCOSE TEST test strip Generic drug:  glucose blood CHECK YOUR BLOOD SUGAR ONE TIME DAILY   VENTOLIN HFA 108 (90 Base) MCG/ACT inhaler Generic drug:  albuterol INHALE TWO PUFFS INTO LUNGS EVERY 6 HOURS AS NEEDED FOR WHEEZING OR SHORTNESS OF BREATH  Vitamin D 2000 units Caps Take 5,000 Units by mouth daily.       Allergies:  Allergies  Allergen Reactions  . Lamictal [Lamotrigine]     itch  . Prozac [Fluoxetine Hcl]     seizure  . Statins Other (See Comments)    Muscle pains  . Sulfa Drugs Cross Reactors     Dyspnea     Past Medical History, Surgical history, Social history, and Family History were reviewed and updated.  Review of Systems: Review of Systems  Constitutional: Negative.   HENT: Negative.   Eyes: Negative.   Respiratory: Negative.   Cardiovascular: Negative.   Gastrointestinal: Negative.   Genitourinary: Negative.   Musculoskeletal: Negative.   Skin: Negative.   Neurological: Negative.   Endo/Heme/Allergies: Negative.   Psychiatric/Behavioral: Negative.      Physical Exam:  vitals were not taken for this visit.   Wt Readings from Last 3 Encounters:  07/09/18 154 lb  (69.9 kg)  03/09/18 157 lb (71.2 kg)  02/19/18 156 lb 9.6 oz (71 kg)    Physical Exam  Constitutional: She is oriented to person, place, and time.  Her breast exam shows her right breast with no masses, edema or erythema. There is no right axillary adenopathy. Left breast is somewhat contracted from surgery and radiation. She has a well-healed lumpectomy scar at the 6:00 position. There is some slight firmness at the lumpectomy site. There is no distinct mass. There is no left axillary adenopathy.  HENT:  Head: Normocephalic and atraumatic.  Mouth/Throat: Oropharynx is clear and moist.  Eyes: Pupils are equal, round, and reactive to light. EOM are normal.  Neck: Normal range of motion.  Cardiovascular: Normal rate, regular rhythm and normal heart sounds.  Pulmonary/Chest: Effort normal and breath sounds normal.  Abdominal: Soft. Bowel sounds are normal.  Musculoskeletal: Normal range of motion. She exhibits no edema, tenderness or deformity.  Lymphadenopathy:    She has no cervical adenopathy.  Neurological: She is alert and oriented to person, place, and time.  Skin: Skin is warm and dry. No rash noted. No erythema.  Psychiatric: She has a normal mood and affect. Her behavior is normal. Judgment and thought content normal.  Vitals reviewed.  athy.   Lab Results  Component Value Date   WBC 7.2 07/10/2018   HGB 13.7 07/10/2018   HCT 42.7 07/10/2018   MCV 92.8 07/10/2018   PLT 200 07/10/2018   No results found for: FERRITIN, IRON, TIBC, UIBC, IRONPCTSAT Lab Results  Component Value Date   RBC 4.60 07/10/2018   No results found for: KPAFRELGTCHN, LAMBDASER, KAPLAMBRATIO No results found for: IGGSERUM, IGA, IGMSERUM No results found for: Odetta Pink, SPEI   Chemistry      Component Value Date/Time   NA 141 07/09/2018 1152   NA 139 07/02/2017 1445   NA 142 01/01/2017 1059   K 4.4 07/09/2018 1152   K 4.6 07/02/2017 1445    K 4.6 01/01/2017 1059   CL 102 07/09/2018 1152   CL 103 07/02/2017 1445   CL 105 01/20/2015 1240   CO2 30 07/09/2018 1152   CO2 28 07/02/2017 1445   CO2 26 01/01/2017 1059   BUN 10 07/09/2018 1152   BUN 10 07/02/2017 1445   BUN 10.6 01/01/2017 1059   CREATININE 0.67 07/09/2018 1152   CREATININE 0.90 12/31/2017 1424   CREATININE 0.67 07/02/2017 1445   CREATININE 0.8 01/01/2017 1059      Component Value Date/Time  CALCIUM 9.1 07/09/2018 1152   CALCIUM 10.3 07/02/2017 1445   CALCIUM 10.1 01/01/2017 1059   ALKPHOS 45 07/09/2018 1152   ALKPHOS 47 07/02/2017 1445   ALKPHOS 49 01/01/2017 1059   AST 21 07/09/2018 1152   AST 47 (H) 12/31/2017 1424   AST 60 (H) 01/01/2017 1059   ALT 22 07/09/2018 1152   ALT 56 (H) 12/31/2017 1424   ALT 63 (H) 01/01/2017 1059   BILITOT 0.4 07/09/2018 1152   BILITOT 0.6 12/31/2017 1424   BILITOT 0.64 01/01/2017 1059     Impression and Plan: Ms. Kohan is a most delightful 72 year old postmenopausal white female. She had stage IIA ductal carcinoma the left breast. She completed tamoxifen in September 2017.  I do not see any evidence of recurrent disease.  We will try to help her with the asthma.  Hopefully, the nebulizer will help.  We will continue to follow her along every 6 months.  She is triple positive so she is at risk for late recurrence. As such, we will continue follow her along every 6 months.      Volanda Napoleon, MD 9/20/20191:39 PM

## 2018-07-11 LAB — VITAMIN D 25 HYDROXY (VIT D DEFICIENCY, FRACTURES): Vit D, 25-Hydroxy: 79.5 ng/mL (ref 30.0–100.0)

## 2018-07-18 ENCOUNTER — Other Ambulatory Visit: Payer: Self-pay | Admitting: Family Medicine

## 2018-07-22 ENCOUNTER — Other Ambulatory Visit: Payer: Self-pay | Admitting: *Deleted

## 2018-07-22 MED ORDER — ALBUTEROL SULFATE HFA 108 (90 BASE) MCG/ACT IN AERS: INHALATION_SPRAY | RESPIRATORY_TRACT | 1 refills | Status: DC

## 2018-07-31 ENCOUNTER — Other Ambulatory Visit: Payer: Self-pay | Admitting: Family Medicine

## 2018-07-31 DIAGNOSIS — K219 Gastro-esophageal reflux disease without esophagitis: Secondary | ICD-10-CM

## 2018-08-05 DIAGNOSIS — Z0100 Encounter for examination of eyes and vision without abnormal findings: Secondary | ICD-10-CM | POA: Diagnosis not present

## 2018-08-05 DIAGNOSIS — H5210 Myopia, unspecified eye: Secondary | ICD-10-CM | POA: Diagnosis not present

## 2018-08-05 LAB — HM DIABETES EYE EXAM

## 2018-08-06 ENCOUNTER — Telehealth: Payer: Self-pay | Admitting: *Deleted

## 2018-08-06 NOTE — Telephone Encounter (Signed)
Received Diabetic Eye Exam Report from [facility]; forwarded to provider/SLS 10/17

## 2018-08-13 ENCOUNTER — Other Ambulatory Visit: Payer: Self-pay | Admitting: Family Medicine

## 2018-08-13 DIAGNOSIS — F250 Schizoaffective disorder, bipolar type: Secondary | ICD-10-CM | POA: Diagnosis not present

## 2018-08-13 DIAGNOSIS — I10 Essential (primary) hypertension: Secondary | ICD-10-CM

## 2018-08-18 ENCOUNTER — Encounter: Payer: Self-pay | Admitting: Family Medicine

## 2018-09-11 DIAGNOSIS — C678 Malignant neoplasm of overlapping sites of bladder: Secondary | ICD-10-CM | POA: Diagnosis not present

## 2018-10-09 ENCOUNTER — Other Ambulatory Visit: Payer: Self-pay | Admitting: Family Medicine

## 2018-10-10 ENCOUNTER — Other Ambulatory Visit: Payer: Self-pay

## 2018-10-10 ENCOUNTER — Emergency Department (HOSPITAL_BASED_OUTPATIENT_CLINIC_OR_DEPARTMENT_OTHER)
Admission: EM | Admit: 2018-10-10 | Discharge: 2018-10-10 | Disposition: A | Discharge status: Home / Self Care | Payer: Medicare HMO | Attending: Emergency Medicine | Admitting: Emergency Medicine

## 2018-10-10 ENCOUNTER — Encounter (HOSPITAL_BASED_OUTPATIENT_CLINIC_OR_DEPARTMENT_OTHER): Payer: Self-pay | Admitting: *Deleted

## 2018-10-10 ENCOUNTER — Emergency Department (HOSPITAL_BASED_OUTPATIENT_CLINIC_OR_DEPARTMENT_OTHER): Payer: Medicare HMO

## 2018-10-10 DIAGNOSIS — J189 Pneumonia, unspecified organism: Secondary | ICD-10-CM

## 2018-10-10 DIAGNOSIS — E119 Type 2 diabetes mellitus without complications: Secondary | ICD-10-CM | POA: Diagnosis not present

## 2018-10-10 DIAGNOSIS — Z7982 Long term (current) use of aspirin: Secondary | ICD-10-CM | POA: Diagnosis not present

## 2018-10-10 DIAGNOSIS — F17210 Nicotine dependence, cigarettes, uncomplicated: Secondary | ICD-10-CM | POA: Insufficient documentation

## 2018-10-10 DIAGNOSIS — R0602 Shortness of breath: Secondary | ICD-10-CM | POA: Diagnosis not present

## 2018-10-10 DIAGNOSIS — J181 Lobar pneumonia, unspecified organism: Secondary | ICD-10-CM | POA: Insufficient documentation

## 2018-10-10 DIAGNOSIS — J4521 Mild intermittent asthma with (acute) exacerbation: Secondary | ICD-10-CM | POA: Insufficient documentation

## 2018-10-10 DIAGNOSIS — I10 Essential (primary) hypertension: Secondary | ICD-10-CM | POA: Insufficient documentation

## 2018-10-10 DIAGNOSIS — Z79899 Other long term (current) drug therapy: Secondary | ICD-10-CM | POA: Diagnosis not present

## 2018-10-10 IMAGING — CR DG CHEST 2V
2 series · 2 of 2 positions shown · non-contrast
Comparison: None.

CLINICAL DATA: Chronic shortness of breath over the last month,
worsening today.

EXAM:
CHEST - 2 VIEW

[w chest pa]
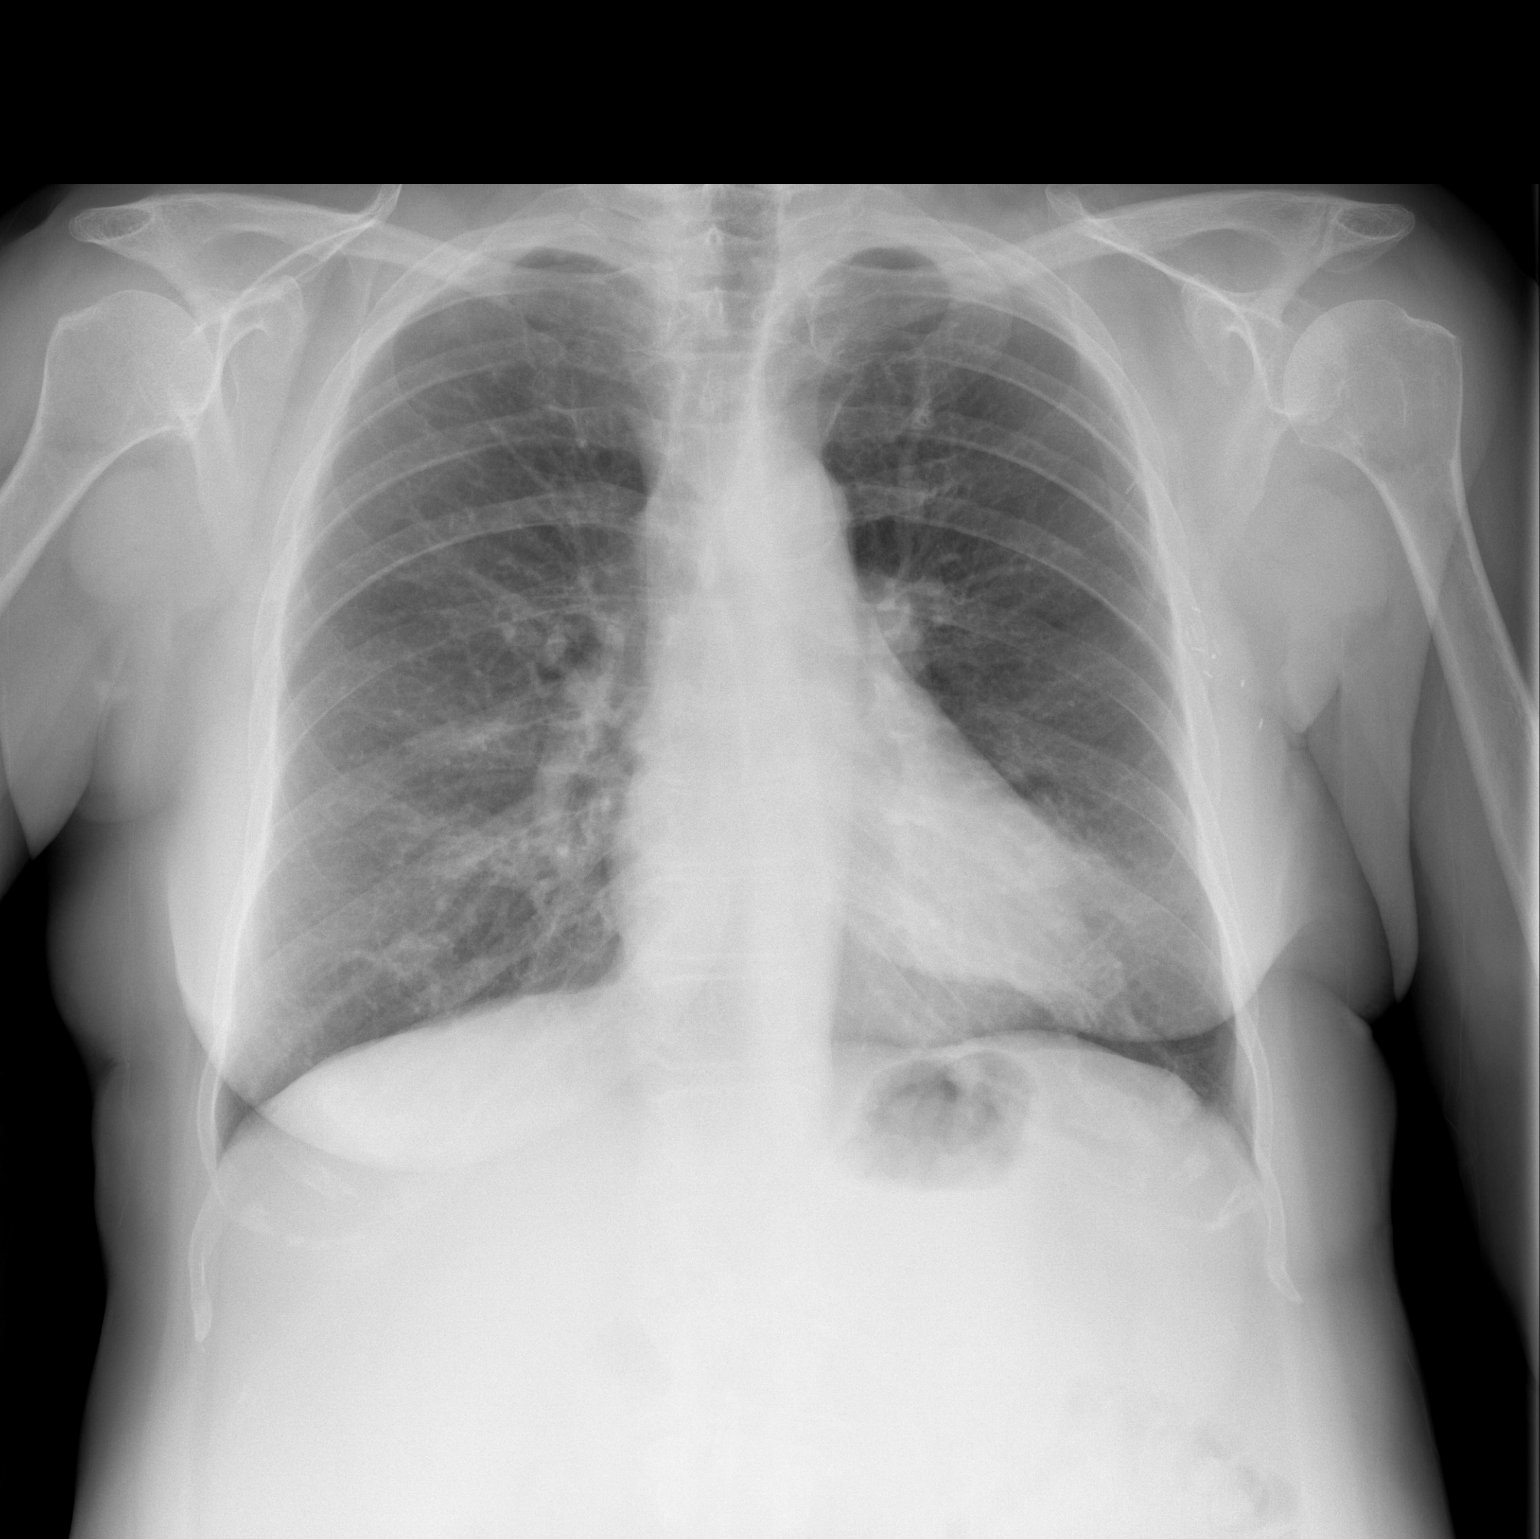

[w chest lat]
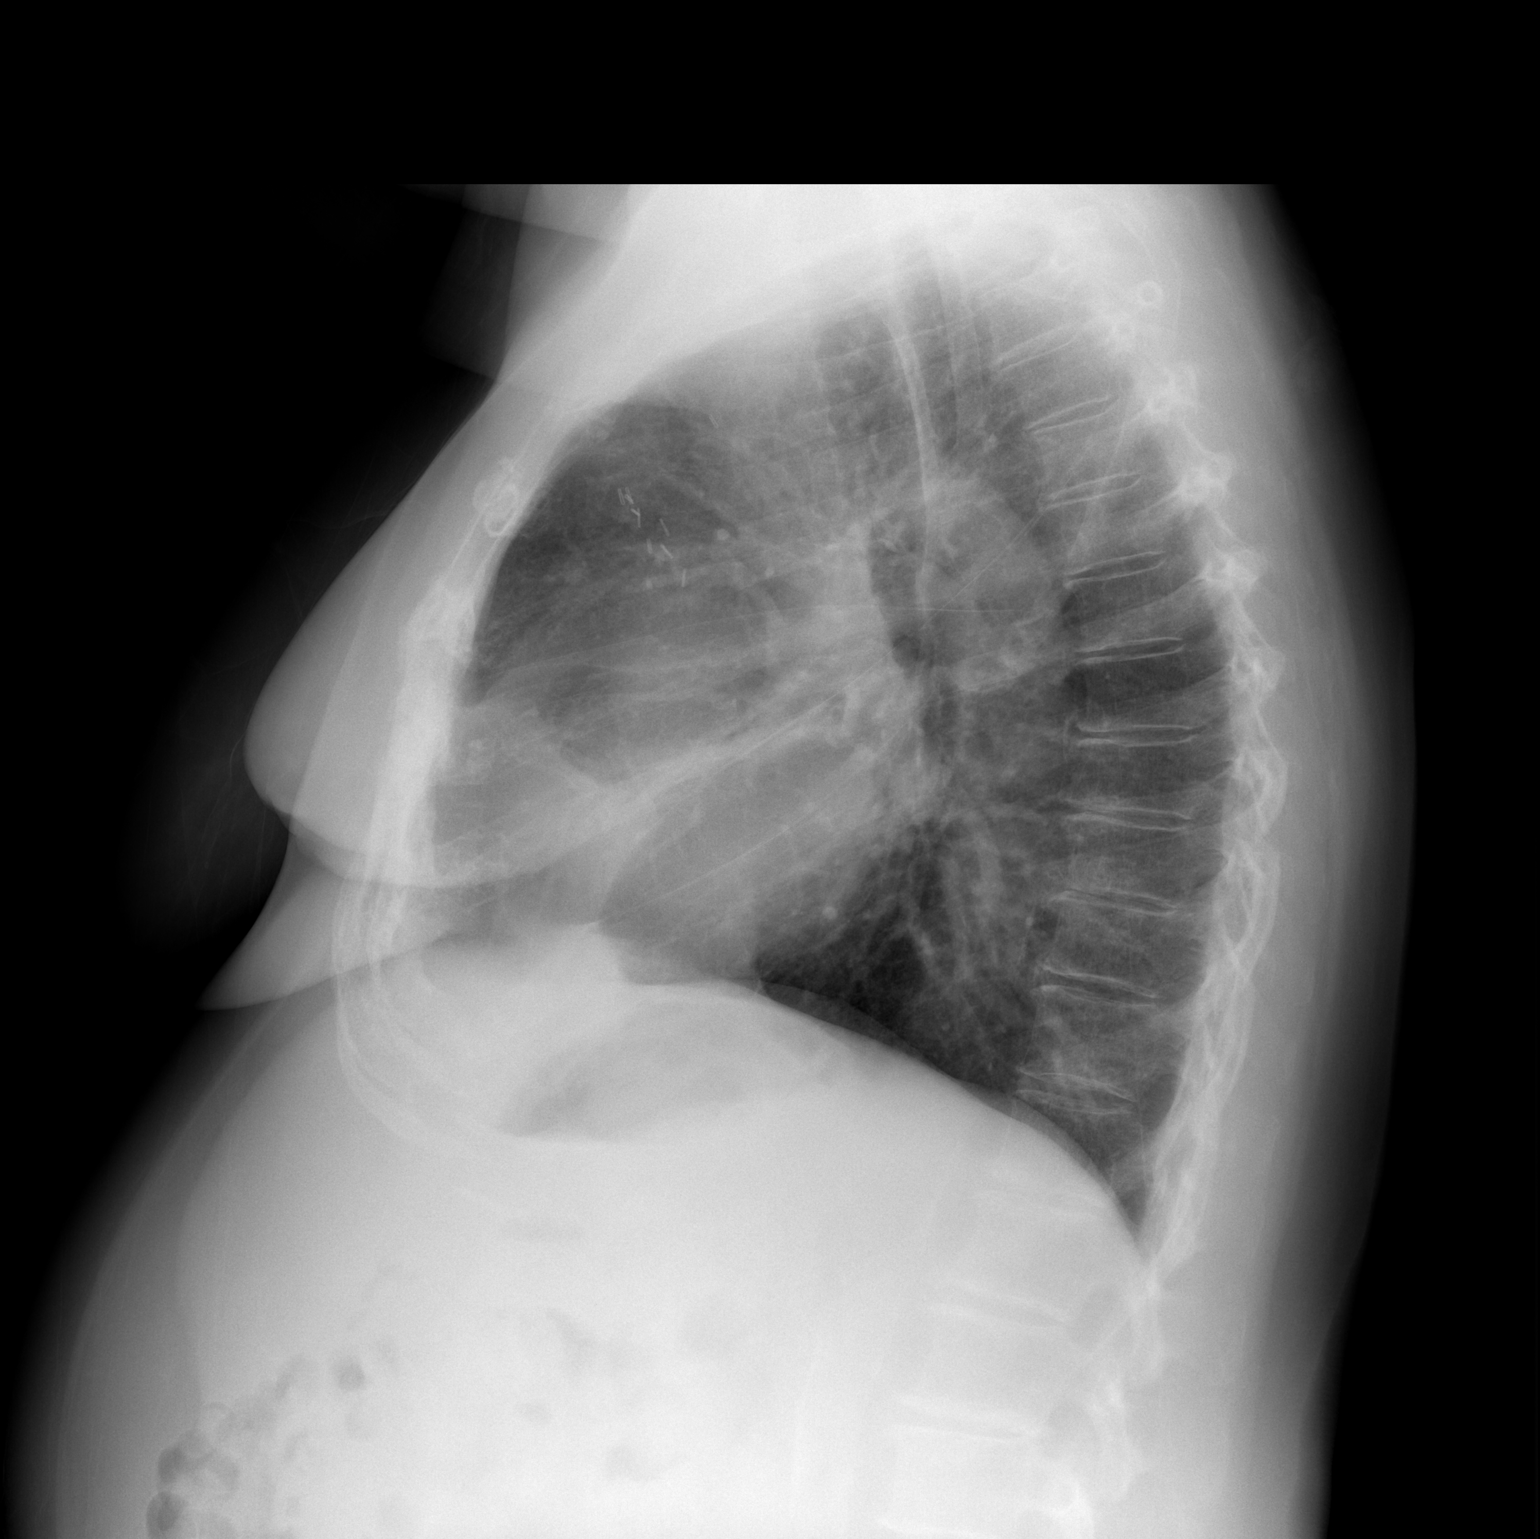

[2 of 2 positions shown; findings below may reference images not displayed]

FINDINGS: Heart size is normal. There is aortic atherosclerosis. There is some
atelectasis in the perihilar regions, particularly in the lingula.
Lingular pneumonia is possible. No effusions. Previous left
mastectomy.
IMPRESSION: Volume loss in the lingula.  Possible lingular pneumonia.

## 2018-10-10 MED ORDER — IPRATROPIUM-ALBUTEROL 0.5-2.5 (3) MG/3ML IN SOLN
3.0000 mL | Freq: Four times a day (QID) | RESPIRATORY_TRACT | Status: DC
  Administered 2018-10-10: 3 mL via RESPIRATORY_TRACT
  Filled 2018-10-10: qty 3

## 2018-10-10 MED ORDER — DEXAMETHASONE SODIUM PHOSPHATE 10 MG/ML IJ SOLN
10.0000 mg | Freq: Once | INTRAMUSCULAR | Status: AC
  Administered 2018-10-10: 10 mg via INTRAMUSCULAR
  Filled 2018-10-10: qty 1

## 2018-10-10 MED ORDER — DOXYCYCLINE HYCLATE 100 MG PO CAPS: 100.0000 mg | ORAL_CAPSULE | Freq: Two times a day (BID) | ORAL | 0 refills | Status: DC

## 2018-10-10 MED ORDER — DOXYCYCLINE HYCLATE 100 MG PO TABS
100.0000 mg | ORAL_TABLET | Freq: Once | ORAL | Status: AC
  Administered 2018-10-10: 100 mg via ORAL
  Filled 2018-10-10: qty 1

## 2018-10-10 MED ORDER — ALBUTEROL SULFATE HFA 108 (90 BASE) MCG/ACT IN AERS
1.0000 | INHALATION_SPRAY | RESPIRATORY_TRACT | Status: DC | PRN
  Administered 2018-10-10: 2 via RESPIRATORY_TRACT
  Filled 2018-10-10: qty 6.7

## 2018-10-10 MED ORDER — AEROCHAMBER PLUS FLO-VU MEDIUM MISC
1.0000 | Freq: Once | Status: DC
  Filled 2018-10-10: qty 1

## 2018-10-10 MED ORDER — PREDNISONE 10 MG (21) PO TBPK: ORAL_TABLET | ORAL | 0 refills | Status: DC

## 2018-10-10 NOTE — ED Notes (Signed)
ED Provider at bedside. 

## 2018-10-10 NOTE — ED Notes (Signed)
Patient transported to X-ray 

## 2018-10-10 NOTE — ED Notes (Signed)
Pt left at this time with all belongings.  

## 2018-10-10 NOTE — ED Provider Notes (Signed)
[facility] Provider Note   CSN: [id number] Arrival date & time: 10/10/18  2054     History   Chief Complaint Chief Complaint  Patient presents with  . Shortness of Breath    HPI Michelle Long is a 72 y.o. female.  Pt presents to the ED today with SOB.  The pt said she has a hx of asthma and has been using her albuterol inhaler more frequently.  The pt does not have a spacer.  No f/c.  She has had a cough.  Daughter was concerned it may be from Zyprexa, but pt has been on that for years.  No new changes.      Past Medical History:  Diagnosis Date  . Asthma   . Bipolar disorder ([place])   . Bladder cancer ([place])   . Breast cancer ([place])   . Cancer of lower-inner quadrant of left female breast ([place]) 12/08/2013  . Depression   . Diabetes mellitus   . GERD (gastroesophageal reflux disease)   . Hepatitis B infection   . History of transfusion of whole blood    Approximately 2009, due to breast cancer/chemo.  Marland Kitchen Hyperlipidemia   . Personal history of chemotherapy   . Personal history of radiation therapy     Patient Active Problem List   Diagnosis Date Noted  . DM (diabetes mellitus) type II uncontrolled, periph vascular disorder ([place]) 03/03/2017  . Impacted cerumen of left ear 12/10/2016  . Essential hypertension 05/16/2016  . Contact dermatitis 02/17/2015  . Cancer of lower-inner quadrant of left female breast ([place]) 12/08/2013  . Estrogen deficiency 10/22/2013  . Athlete's foot 04/03/2013  . Elevated BP 11/05/2012  . Grief 10/06/2012  . Fatigue 02/13/2012  . Easy bruising 09/25/2011  . Leg cramps 08/17/2011  . Arm swelling 08/16/2011  . UTI (lower urinary tract infection) 06/04/2011  . Vitamin D deficiency 05/29/2011  . Preventative health care 05/29/2011  . Hx of abnormal cervical Pap smear 05/29/2011  . Dysuria 05/15/2011  . Dry skin 05/15/2011  . Asthma   . Bladder cancer ([place])   . Bipolar disorder ([place])   . Inadequately  controlled diabetes mellitus ([place])   . GERD (gastroesophageal reflux disease)   . Hyperlipidemia LDL goal <70     Past Surgical History:  Procedure Laterality Date  . ABDOMINAL HYSTERECTOMY    . APPENDECTOMY  1961  . bladder cancer  2006  . BREAST LUMPECTOMY Left 2007  . PARTIAL HYSTERECTOMY  1991  . TONSILLECTOMY  1951     OB History   No obstetric history on file.      Home Medications    Prior to Admission medications   Medication Sig Start Date End Date Taking? Authorizing Provider  albuterol (VENTOLIN HFA) 108 (90 Base) MCG/ACT inhaler INHALE TWO PUFFS INTO LUNGS EVERY 6 HOURS AS NEEDED FOR WHEEZING OR SHORTNESS OF BREATH 07/22/18  Yes Lowne Lyndal Pulley R, DO  alendronate (FOSAMAX) 70 MG tablet Take 1 tablet (70 mg total) by mouth every 7 (seven) days. Take with a full glass of water on an empty stomach. 06/23/18  Yes Roma Schanz R, DO  aspirin 81 MG tablet Take 81 mg by mouth daily.   Yes [provider]  b complex vitamins tablet Take 1 tablet by mouth daily.   Yes [provider]  Blood Glucose Monitoring Suppl (TRUE METRIX AIR GLUCOSE METER) DEVI  12/21/14  Yes [provider]  Cholecalciferol (VITAMIN D) 2000 units CAPS  Take 5,000 Units by mouth daily.    Yes [provider]  citalopram (CELEXA) 10 MG tablet Take 0.5 tablets (5 mg total) by mouth at bedtime. Taking 5 mg. Only. 09/02/16  Yes Roma Schanz R, DO  ezetimibe (ZETIA) 10 MG tablet TAKE 1 TABLET EVERY DAY 05/26/18  Yes Roma Schanz R, DO  fish oil-omega-3 fatty acids 1000 MG capsule Take 1 g by mouth daily.     Yes [provider]  glucose blood (TRUE METRIX BLOOD GLUCOSE TEST) test strip Check blood sugar once a day.  Dx: E11.51. 07/21/18  Yes Ann Held, DO  Lancets Misc. MISC Trueplus 33g lancets  Use as directed once a day 12/09/17  Yes Lowne Chase, Yvonne R, DO  LORazepam (ATIVAN) 0.5 MG tablet Take 1 tablet (0.5 mg total) by mouth 2  (two) times daily as needed. 09/02/16  Yes Roma Schanz R, DO  losartan (COZAAR) 50 MG tablet TAKE 1 TABLET EVERY DAY 08/14/18  Yes Carollee Herter, Kendrick Fries R, DO  metFORMIN (GLUCOPHAGE) 1000 MG tablet TAKE 1 TABLET TWICE DAILY WITH MEALS 08/14/18  Yes Roma Schanz R, DO  Multiple Vitamin (MULTIVITAMIN) tablet Take 1 tablet by mouth daily.   Yes [provider]  OLANZapine (ZYPREXA) 5 MG tablet Take 5 mg by mouth daily.   Yes [provider]  pantoprazole (PROTONIX) 40 MG tablet TAKE 1 TABLET EVERY DAY 08/03/18  Yes Roma Schanz R, DO  rosuvastatin (CRESTOR) 5 MG tablet Take 1 tablet by mouth 3 times a week 05/05/18  Yes Ann Held, DO  Spacer/Aero Chamber Mouthpiece MISC To use with inhaler 02/18/17  Yes Ann Held, DO  SYMBICORT 160-4.5 MCG/ACT inhaler INHALE 2 PUFFS TWICE DAILY 08/03/18  Yes Ann Held, DO  doxycycline (VIBRAMYCIN) 100 MG capsule Take 1 capsule (100 mg total) by mouth 2 (two) times daily. 10/10/18   Isla Pence, MD  predniSONE (STERAPRED UNI-PAK 21 TAB) 10 MG (21) TBPK tablet Take 6 tabs for 2 days, then 5 for 2 days, then 4 for 2 days, then 3 for 2 days, 2 for 2 days, then 1 for 2 days 10/10/18   Isla Pence, MD    Family History Family History  Problem Relation Age of Onset  . Arthritis Mother   . Breast cancer Mother   . Transient ischemic attack Mother   . Hypertension Mother   . Heart disease Father   . Diabetes Father   . Cancer Father        bladder cancer  . Throat cancer Father     Social History Social History   Tobacco Use  . Smoking status: Former Smoker    Packs/day: 1.50    Years: 33.00    Pack years: 49.50    Types: Cigarettes    Start date: 12/08/1964    Last attempt to quit: 08/21/1998    Years since quitting: 20.1  . Smokeless tobacco: Never Used  . Tobacco comment: quit 16 years ago  Substance Use Topics  . Alcohol use: Yes    Alcohol/week: 0.0 standard drinks     Comment: very rarely  . Drug use: No     Allergies   Lamictal [lamotrigine]; Prozac [fluoxetine hcl]; Statins; and Sulfa drugs cross reactors   Review of Systems Review of Systems  Respiratory: Positive for cough, shortness of breath and wheezing.   All other systems reviewed and are negative.    Physical Exam  Updated Vital Signs BP (!) 171/77 (BP Location: Left Arm)   Pulse 93   Temp 98.2 F (36.8 C) (Oral)   Resp (!) 28   Ht 5\' 4"  (1.626 m)   Wt 69.9 kg   SpO2 93%   BMI 26.43 kg/m   Physical Exam Vitals signs and nursing note reviewed.  Constitutional:      Appearance: She is well-developed and normal weight.  HENT:     Head: Normocephalic and atraumatic.     Mouth/Throat:     Mouth: Mucous membranes are moist.     Pharynx: Oropharynx is clear.  Eyes:     Extraocular Movements: Extraocular movements intact.     Pupils: Pupils are equal, round, and reactive to light.  Neck:     Musculoskeletal: Normal range of motion and neck supple.  Cardiovascular:     Rate and Rhythm: Normal rate and regular rhythm.  Pulmonary:     Effort: Pulmonary effort is normal.     Breath sounds: Wheezing present.  Musculoskeletal: Normal range of motion.  Skin:    General: Skin is warm and dry.     Capillary Refill: Capillary refill takes less than 2 seconds.  Neurological:     General: No focal deficit present.     Mental Status: She is alert and oriented to person, place, and time.  Psychiatric:        Mood and Affect: Mood normal.        Behavior: Behavior normal.      ED Treatments / Results  Labs (all labs ordered are listed, but only abnormal results are displayed) Labs Reviewed - No data to display  EKG None  Radiology Dg Chest 2 View  Result Date: 10/10/2018 CLINICAL DATA:  Chronic shortness of breath over the last month, worsening today. EXAM: CHEST - 2 VIEW COMPARISON:  None. FINDINGS: Heart size is normal. There is aortic atherosclerosis. There is some  atelectasis in the perihilar regions, particularly in the lingula. Lingular pneumonia is possible. No effusions. Previous left mastectomy. IMPRESSION: Volume loss in the lingula.  Possible lingular pneumonia. Electronically Signed   By: Nelson Chimes M.D.   On: 10/10/2018 21:40    Procedures Procedures (including critical care time)  Medications Ordered in ED Medications  ipratropium-albuterol (DUONEB) 0.5-2.5 (3) MG/3ML nebulizer solution 3 mL (3 mLs Nebulization Given 10/10/18 2137)  AEROCHAMBER PLUS FLO-VU MEDIUM MISC 1 each (has no administration in time range)  albuterol (PROVENTIL HFA;VENTOLIN HFA) 108 (90 Base) MCG/ACT inhaler 1-2 puff (2 puffs Inhalation Given 10/10/18 2137)  doxycycline (VIBRA-TABS) tablet 100 mg (has no administration in time range)  dexamethasone (DECADRON) injection 10 mg (10 mg Intramuscular Given 10/10/18 2200)     Initial Impression / Assessment and Plan / ED Course  I have reviewed the triage vital signs and the nursing notes.  Pertinent labs & imaging results that were available during my care of the patient were reviewed by me and considered in my medical decision making (see chart for details).   I suspect no correlation between zyprexa and current sx.   Pt has a lingular pna on CXR.  She will be started on doxy.  She is breathing better after duoneb and decadron.  She is given an albuterol inhaler and spacer for home.  She will be d/c home on prednisone.  She is saturating well.  She knows to return if worse.  F/u with pcp.  Final Clinical Impressions(s) / ED Diagnoses   Final diagnoses:  Lingular  pneumonia  Mild intermittent asthma with exacerbation    ED Discharge Orders         Ordered    doxycycline (VIBRAMYCIN) 100 MG capsule  2 times daily     10/10/18 2205    predniSONE (STERAPRED UNI-PAK 21 TAB) 10 MG (21) TBPK tablet     10/10/18 2205           Isla Pence, MD 10/10/18 2210

## 2018-10-10 NOTE — ED Triage Notes (Signed)
Pt reports SOB this week. She has used rescue inhaler without relief. Spoke to triage nurse and was advised to come ED. Speaking full sentences in triage. Her daughter? If she is having a side effect from Zyprexa

## 2018-10-12 ENCOUNTER — Ambulatory Visit: Payer: Self-pay | Admitting: *Deleted

## 2018-10-12 ENCOUNTER — Telehealth: Payer: Self-pay | Admitting: *Deleted

## 2018-10-12 DIAGNOSIS — J189 Pneumonia, unspecified organism: Secondary | ICD-10-CM | POA: Diagnosis not present

## 2018-10-12 NOTE — Telephone Encounter (Signed)
There is also another phone note that she wants an appt within a couple days after ED visit.

## 2018-10-12 NOTE — Telephone Encounter (Signed)
Copied from [place] [id number]. Topic: General - Other >> Oct 12, 2018 11:41 AM Yvette Rack wrote: Reason for CRM: pt calling stating that she need an ER f/u of pneumonia within two days she is a [facility] pt

## 2018-10-12 NOTE — Telephone Encounter (Signed)
I'm not back until next Thursday Is there anyone that can see her Thursday or Friday?

## 2018-10-12 NOTE — Telephone Encounter (Signed)
Pt calling with complaints of difficulty breathing and wanted to know if she should go to [facility] tonight to receive a breathing treatment. Pt went to the ED on 10/10/18 and was diagnosed with pneumonia and was given a prescription for steroids and an antibiotic. Pt states she feels "breathless"with exertion and has been using her albuterol inhaler and Symbicort but is still having some shortness of breath. Pt states she was told  in the ED that she could use her albuterol more often than every 4 hours if needed. Pt states she has used the inhaler at 11 am and 2 pm today. No appt availability for the pt to be seen today.  Pt denies any chest pain, dizziness or other cold like symptoms at this time. Pt advised to seek treatment at [facility] or ED for current symptoms. Pt verbalized understanding.   Reason for Disposition . [1] MILD difficulty breathing (e.g., minimal/no SOB at rest, SOB with walking, pulse <100) AND [2] NEW-onset or WORSE than normal  Answer Assessment - Initial Assessment Questions 1. RESPIRATORY STATUS: "Describe your breathing?" (e.g., wheezing, shortness of breath, unable to speak, severe coughing)      No wheezing, feels breathless with exertion 2. ONSET: "When did this breathing problem begin?"      Saturday 3. PATTERN "Does the difficult breathing come and go, or has it been constant since it started?"      Comes and goes, worse with exertion 4. SEVERITY: "How bad is your breathing?" (e.g., mild, moderate, severe)    - MILD: No SOB at rest, mild SOB with walking, speaks normally in sentences, can lay down, no retractions, pulse < 100.    - MODERATE: SOB at rest, SOB with minimal exertion and prefers to sit, cannot lie down flat, speaks in phrases, mild retractions, audible wheezing, pulse 100-120.    - SEVERE: Very SOB at rest, speaks in single words, struggling to breathe, sitting hunched forward, retractions, pulse > 120      SOB worse with exertion prefers to sit,  moderate 5. RECURRENT SYMPTOM: "Have you had difficulty breathing before?" If so, ask: "When was the last time?" and "What happened that time?"     Yes recent ED visit on 12/21 and was diagnosed with PNA 6. CARDIAC HISTORY: "Do you have any history of heart disease?" (e.g., heart attack, angina, bypass surgery, angioplasty)      Not assesed 7. LUNG HISTORY: "Do you have any history of lung disease?"  (e.g., pulmonary embolus, asthma, emphysema)     asthma 8. CAUSE: "What do you think is causing the breathing problem?"      PNA 9. OTHER SYMPTOMS: "Do you have any other symptoms? (e.g., dizziness, runny nose, cough, chest pain, fever)     No 10. PREGNANCY: "Is there any chance you are pregnant?" "When was your last menstrual period?"       n/a 11. TRAVEL: "Have you traveled out of the country in the last month?" (e.g., travel history, exposures)       No  Protocols used: BREATHING DIFFICULTY-A-AH

## 2018-10-12 NOTE — Telephone Encounter (Signed)
See other phone message  

## 2018-10-13 ENCOUNTER — Ambulatory Visit: Payer: Self-pay

## 2018-10-13 NOTE — Telephone Encounter (Signed)
Incoming call from Patient stating that she went to [facility] received a breathing treatment. Patient denies chest tightness and states that she  feels much better.

## 2018-10-13 NOTE — Telephone Encounter (Signed)
Left  Message on machine to see if [facility] and how was she doing.  Also to make an appointment either Thursday or Friday with another provider for ED follow up.

## 2018-10-13 NOTE — Telephone Encounter (Signed)
Patient called, left VM to return call to the office for a message from Dr. Carollee Herter.

## 2018-10-15 ENCOUNTER — Telehealth: Payer: Self-pay

## 2018-10-15 NOTE — Telephone Encounter (Signed)
Copied from [place] [id number]. Topic: General - Other >> Oct 13, 2018  1:35 PM Mcneil, Ja-Kwan wrote: Reason for CRM: Pt returned call stating she went to [facility] and now she feels so much better.

## 2018-10-16 NOTE — Telephone Encounter (Signed)
Left message on machine for patient to call back to get scheduled for ED follow up with another provider.

## 2018-10-16 NOTE — Telephone Encounter (Signed)
Copied from [place] [id number]. Topic: Conservator, museum/gallery Patient (Clinic Use ONLY) >> Oct 16, 2018  8:12 AM Doylene Canning, CMA wrote: Reason for CRM: see phone note from 12/23, see if patient is ok with scheduling with another provider for ed follow up >> Oct 16, 2018 12:20 PM Carolyn Stare wrote:   Pt said she is feeling better and no longer need an appt

## 2018-10-16 NOTE — Telephone Encounter (Signed)
Attempted to contact pt to schedule hospital follow up appointment; left message on voicemail [phone].

## 2018-10-19 NOTE — Telephone Encounter (Signed)
Looks like from other phone note [facility] and now feels better

## 2018-10-23 ENCOUNTER — Other Ambulatory Visit: Payer: Self-pay | Admitting: Family Medicine

## 2018-10-28 ENCOUNTER — Other Ambulatory Visit: Payer: Self-pay | Admitting: Family Medicine

## 2018-10-28 ENCOUNTER — Telehealth: Payer: Self-pay | Admitting: Family Medicine

## 2018-10-28 MED ORDER — BUDESONIDE-FORMOTEROL FUMARATE 160-4.5 MCG/ACT IN AERO: 2.0000 | INHALATION_SPRAY | Freq: Two times a day (BID) | RESPIRATORY_TRACT | 0 refills | Status: DC

## 2018-10-28 NOTE — Telephone Encounter (Signed)
Copied from [place] [id number]. Topic: Quick Communication - Rx Refill/Question >> Oct 28, 2018  2:03 PM Barry, [place] D wrote: Medication: budesonide-formoterol (SYMBICORT) 160-4.5 MCG/ACT inhaler / Pt would like to know if Dr. Etter Sjogren can send in a rx to [place] she runs out before she received symbicort from [facility] as it takes 5-7 days to receive and rx was just filled today. Please advise pt.  Has the patient contacted their pharmacy? Yes.   (Agent: If no, request that the patient contact the pharmacy for the refill.) (Agent: If yes, when and what did the pharmacy advise?)  Preferred Pharmacy (with phone number or street name): [facility] [phone] (Phone) [phone] (Fax)    Agent: Please be advised that RX refills may take up to 3 business days. We ask that you follow-up with your pharmacy.

## 2018-11-13 ENCOUNTER — Encounter: Payer: Self-pay | Admitting: Family Medicine

## 2018-11-13 NOTE — Telephone Encounter (Signed)
Aspirin can cause bruising  Other otc meds can as well --- ie fish oil , ginsing etc

## 2018-12-14 ENCOUNTER — Other Ambulatory Visit: Payer: Self-pay | Admitting: Family Medicine

## 2018-12-14 DIAGNOSIS — K219 Gastro-esophageal reflux disease without esophagitis: Secondary | ICD-10-CM | POA: Diagnosis not present

## 2018-12-14 DIAGNOSIS — J45998 Other asthma: Secondary | ICD-10-CM | POA: Diagnosis not present

## 2018-12-14 MED ORDER — BUDESONIDE-FORMOTEROL FUMARATE 160-4.5 MCG/ACT IN AERO: 2.0000 | INHALATION_SPRAY | Freq: Two times a day (BID) | RESPIRATORY_TRACT | 0 refills | Status: DC

## 2018-12-14 NOTE — Telephone Encounter (Signed)
Copied from [place] [phone]. Topic: Quick Communication - Rx Refill/Question >> Dec 14, 2018  8:16 AM Judyann Munson wrote: Medication: budesonide-formoterol (SYMBICORT) 160-4.5 MCG/ACT inhaler- patient  stated she is needing a authorization code.   Has the patient contacted their pharmacy? no   Preferred Pharmacy (with phone number or street name): [facility] [phone] (Phone) [phone] (Fax)    Agent: Please be advised that RX refills may take up to 3 business days. We ask that you follow-up with your pharmacy.

## 2019-01-08 ENCOUNTER — Ambulatory Visit (INDEPENDENT_AMBULATORY_CARE_PROVIDER_SITE_OTHER): Payer: Medicare HMO | Admitting: Family Medicine

## 2019-01-08 ENCOUNTER — Other Ambulatory Visit: Payer: Self-pay

## 2019-01-08 ENCOUNTER — Inpatient Hospital Stay: Payer: Medicare HMO | Attending: Family

## 2019-01-08 ENCOUNTER — Encounter: Payer: Self-pay | Admitting: Family Medicine

## 2019-01-08 ENCOUNTER — Encounter: Payer: Self-pay | Admitting: Family

## 2019-01-08 ENCOUNTER — Inpatient Hospital Stay (HOSPITAL_BASED_OUTPATIENT_CLINIC_OR_DEPARTMENT_OTHER): Payer: Medicare HMO | Admitting: Family

## 2019-01-08 VITALS — BP 126/70 | HR 106 | Temp 98.2°F | Resp 16 | Ht 63.5 in | Wt 152.2 lb

## 2019-01-08 VITALS — BP 115/61 | HR 110 | Temp 98.1°F | Resp 16 | Wt 153.0 lb

## 2019-01-08 DIAGNOSIS — E785 Hyperlipidemia, unspecified: Secondary | ICD-10-CM

## 2019-01-08 DIAGNOSIS — I10 Essential (primary) hypertension: Secondary | ICD-10-CM | POA: Diagnosis not present

## 2019-01-08 DIAGNOSIS — Z853 Personal history of malignant neoplasm of breast: Secondary | ICD-10-CM | POA: Diagnosis not present

## 2019-01-08 DIAGNOSIS — E1169 Type 2 diabetes mellitus with other specified complication: Secondary | ICD-10-CM | POA: Diagnosis not present

## 2019-01-08 DIAGNOSIS — Z7982 Long term (current) use of aspirin: Secondary | ICD-10-CM | POA: Insufficient documentation

## 2019-01-08 DIAGNOSIS — Z170 Estrogen receptor positive status [ER+]: Secondary | ICD-10-CM

## 2019-01-08 DIAGNOSIS — J45909 Unspecified asthma, uncomplicated: Secondary | ICD-10-CM

## 2019-01-08 DIAGNOSIS — J4520 Mild intermittent asthma, uncomplicated: Secondary | ICD-10-CM | POA: Diagnosis not present

## 2019-01-08 DIAGNOSIS — E119 Type 2 diabetes mellitus without complications: Secondary | ICD-10-CM | POA: Diagnosis not present

## 2019-01-08 DIAGNOSIS — Z79899 Other long term (current) drug therapy: Secondary | ICD-10-CM | POA: Insufficient documentation

## 2019-01-08 DIAGNOSIS — C50312 Malignant neoplasm of lower-inner quadrant of left female breast: Secondary | ICD-10-CM

## 2019-01-08 DIAGNOSIS — E559 Vitamin D deficiency, unspecified: Secondary | ICD-10-CM

## 2019-01-08 DIAGNOSIS — J4521 Mild intermittent asthma with (acute) exacerbation: Secondary | ICD-10-CM

## 2019-01-08 DIAGNOSIS — E1165 Type 2 diabetes mellitus with hyperglycemia: Secondary | ICD-10-CM | POA: Insufficient documentation

## 2019-01-08 LAB — LIPID PANEL
Cholesterol: 142 mg/dL (ref 0–200)
HDL: 47.3 mg/dL (ref >39.00)
LDL Cholesterol: 64 mg/dL (ref 0–99)
NonHDL: 94.49
Total CHOL/HDL Ratio: 3
Triglycerides: 152 mg/dL — ABNORMAL HIGH (ref 0.0–149.0)
VLDL: 30.4 mg/dL (ref 0.0–40.0)

## 2019-01-08 LAB — COMPREHENSIVE METABOLIC PANEL
ALT: 36 U/L — ABNORMAL HIGH (ref 0–35)
AST: 30 U/L (ref 0–37)
Albumin: 4.3 g/dL (ref 3.5–5.2)
Alkaline Phosphatase: 36 U/L — ABNORMAL LOW (ref 39–117)
BUN: 12 mg/dL (ref 6–23)
CO2: 30 mEq/L (ref 19–32)
Calcium: 9.8 mg/dL (ref 8.4–10.5)
Chloride: 102 mEq/L (ref 96–112)
Creatinine, Ser: 0.73 mg/dL (ref 0.40–1.20)
GFR: 78.2 mL/min (ref >60.00)
Glucose, Bld: 112 mg/dL — ABNORMAL HIGH (ref 70–99)
Potassium: 4.7 mEq/L (ref 3.5–5.1)
Sodium: 141 mEq/L (ref 135–145)
Total Bilirubin: 0.4 mg/dL (ref 0.2–1.2)
Total Protein: 6.6 g/dL (ref 6.0–8.3)

## 2019-01-08 LAB — CMP (CANCER CENTER ONLY)
ALT: 38 U/L (ref 0–44)
AST: 34 U/L (ref 15–41)
Albumin: 4.6 g/dL (ref 3.5–5.0)
Alkaline Phosphatase: 35 U/L — ABNORMAL LOW (ref 38–126)
Anion gap: 12 (ref 5–15)
BUN: 12 mg/dL (ref 8–23)
CO2: 26 mmol/L (ref 22–32)
Calcium: 9.4 mg/dL (ref 8.9–10.3)
Chloride: 103 mmol/L (ref 98–111)
Creatinine: 0.78 mg/dL (ref 0.44–1.00)
GFR, Est AFR Am: >60 mL/min (ref >60)
GFR, Estimated: >60 mL/min (ref >60)
Glucose, Bld: 153 mg/dL — ABNORMAL HIGH (ref 70–99)
Potassium: 4.5 mmol/L (ref 3.5–5.1)
Sodium: 141 mmol/L (ref 135–145)
Total Bilirubin: 0.4 mg/dL (ref 0.3–1.2)
Total Protein: 6.9 g/dL (ref 6.5–8.1)

## 2019-01-08 LAB — CBC WITH DIFFERENTIAL (CANCER CENTER ONLY)
Abs Immature Granulocytes: 0.05 10*3/uL (ref 0.00–0.07)
Basophils Absolute: 0.1 10*3/uL (ref 0.0–0.1)
Basophils Relative: 1 %
Eosinophils Absolute: 0.1 10*3/uL (ref 0.0–0.5)
Eosinophils Relative: 1 %
HCT: 44.7 % (ref 36.0–46.0)
Hemoglobin: 14.1 g/dL (ref 12.0–15.0)
Immature Granulocytes: 1 %
Lymphocytes Relative: 21 %
Lymphs Abs: 2 10*3/uL (ref 0.7–4.0)
MCH: 29.6 pg (ref 26.0–34.0)
MCHC: 31.5 g/dL (ref 30.0–36.0)
MCV: 93.7 fL (ref 80.0–100.0)
Monocytes Absolute: 0.6 10*3/uL (ref 0.1–1.0)
Monocytes Relative: 6 %
Neutro Abs: 6.6 10*3/uL (ref 1.7–7.7)
Neutrophils Relative %: 70 %
Platelet Count: 237 10*3/uL (ref 150–400)
RBC: 4.77 MIL/uL (ref 3.87–5.11)
RDW: 13.5 % (ref 11.5–15.5)
WBC Count: 9.3 10*3/uL (ref 4.0–10.5)
nRBC: 0 % (ref 0.0–0.2)

## 2019-01-08 LAB — HEMOGLOBIN A1C: Hgb A1c MFr Bld: 7.2 % — ABNORMAL HIGH (ref 4.6–6.5)

## 2019-01-08 MED ORDER — ALBUTEROL SULFATE HFA 108 (90 BASE) MCG/ACT IN AERS: INHALATION_SPRAY | RESPIRATORY_TRACT | 1 refills | Status: DC

## 2019-01-08 NOTE — Assessment & Plan Note (Signed)
Check labs  con't diet 

## 2019-01-08 NOTE — Patient Instructions (Signed)
Carbohydrate Counting for Diabetes Mellitus, Adult  Carbohydrate counting is a method of keeping track of how many carbohydrates you eat. Eating carbohydrates naturally increases the amount of sugar (glucose) in the blood. Counting how many carbohydrates you eat helps keep your blood glucose within normal limits, which helps you manage your diabetes (diabetes mellitus). It is important to know how many carbohydrates you can safely have in each meal. This is different for every person. A diet and nutrition specialist (registered dietitian) can help you make a meal plan and calculate how many carbohydrates you should have at each meal and snack. Carbohydrates are found in the following foods:  Grains, such as breads and cereals.  Dried beans and soy products.  Starchy vegetables, such as potatoes, peas, and corn.  Fruit and fruit juices.  Milk and yogurt.  Sweets and snack foods, such as cake, cookies, candy, chips, and soft drinks. How do I count carbohydrates? There are two ways to count carbohydrates in food. You can use either of the methods or a combination of both. Reading "Nutrition Facts" on packaged food The "Nutrition Facts" list is included on the labels of almost all packaged foods and beverages in the [place] It includes:  The serving size.  Information about nutrients in each serving, including the grams (g) of carbohydrate per serving. To use the "Nutrition Facts":  Decide how many servings you will have.  Multiply the number of servings by the number of carbohydrates per serving.  The resulting number is the total amount of carbohydrates that you will be having. Learning standard serving sizes of other foods When you eat carbohydrate foods that are not packaged or do not include "Nutrition Facts" on the label, you need to measure the servings in order to count the amount of carbohydrates:  Measure the foods that you will eat with a food scale or measuring cup, if needed.   Decide how many standard-size servings you will eat.  Multiply the number of servings by 15. Most carbohydrate-rich foods have about 15 g of carbohydrates per serving. ? For example, if you eat 8 oz (170 g) of strawberries, you will have eaten 2 servings and 30 g of carbohydrates (2 servings x 15 g = 30 g).  For foods that have more than one food mixed, such as soups and casseroles, you must count the carbohydrates in each food that is included. The following list contains standard serving sizes of common carbohydrate-rich foods. Each of these servings has about 15 g of carbohydrates:   hamburger bun or  English muffin.   oz (15 mL) syrup.   oz (14 g) jelly.  1 slice of bread.  1 six-inch tortilla.  3 oz (85 g) cooked rice or pasta.  4 oz (113 g) cooked dried beans.  4 oz (113 g) starchy vegetable, such as peas, corn, or potatoes.  4 oz (113 g) hot cereal.  4 oz (113 g) mashed potatoes or  of a large baked potato.  4 oz (113 g) canned or frozen fruit.  4 oz (120 mL) fruit juice.  4-6 crackers.  6 chicken nuggets.  6 oz (170 g) unsweetened dry cereal.  6 oz (170 g) plain fat-free yogurt or yogurt sweetened with artificial sweeteners.  8 oz (240 mL) milk.  8 oz (170 g) fresh fruit or one small piece of fruit.  24 oz (680 g) popped popcorn. Example of carbohydrate counting Sample meal  3 oz (85 g) chicken breast.  6 oz (170 g)   brown rice.  4 oz (113 g) corn.  8 oz (240 mL) milk.  8 oz (170 g) strawberries with sugar-free whipped topping. Carbohydrate calculation 1. Identify the foods that contain carbohydrates: ? Rice. ? Corn. ? Milk. ? Strawberries. 2. Calculate how many servings you have of each food: ? 2 servings rice. ? 1 serving corn. ? 1 serving milk. ? 1 serving strawberries. 3. Multiply each number of servings by 15 g: ? 2 servings rice x 15 g = 30 g. ? 1 serving corn x 15 g = 15 g. ? 1 serving milk x 15 g = 15 g. ? 1 serving  strawberries x 15 g = 15 g. 4. Add together all of the amounts to find the total grams of carbohydrates eaten: ? 30 g + 15 g + 15 g + 15 g = 75 g of carbohydrates total. Summary  Carbohydrate counting is a method of keeping track of how many carbohydrates you eat.  Eating carbohydrates naturally increases the amount of sugar (glucose) in the blood.  Counting how many carbohydrates you eat helps keep your blood glucose within normal limits, which helps you manage your diabetes.  A diet and nutrition specialist (registered dietitian) can help you make a meal plan and calculate how many carbohydrates you should have at each meal and snack. This information is not intended to replace advice given to you by your health care provider. Make sure you discuss any questions you have with your health care provider. Document Released: 10/07/2005 Document Revised: 04/16/2017 Document Reviewed: 03/20/2016 Elsevier Interactive Patient Education  2019 [employer]  

## 2019-01-08 NOTE — Progress Notes (Signed)
Hematology and Oncology Follow Up Visit  Michelle Long [id number] April 23, 1946 73 y.o. 01/08/2019   Principle Diagnosis:  Stage IIA (T2 N0M0) infiltrating ductal carcinoma of the left breast  Current Therapy:   Tamoxifen 20 mg by mouth daily - finished in September 2017   Interim History: Michelle Long is here today for follow-up. She is doing quite well and has no complaints at this time.  Mammogram in September 2019 was negative.  Bilateral breast exam today was negative. She has occasional SOB secondary to asthma. This is unchanged.  No fever, chills, n/v, cough, rash, dizziness, chest pain, palpitations, abdominal pain or changes in bowel or bladder habits.  No swelling, tenderness, numbness or tingling in her extremities.  No lymphadenopathy noted on exam.  She has maintained a good appetite and is staying well hydrated. Her weight is stable.   ECOG Performance Status: 0 - Asymptomatic  Medications:  Allergies as of 01/08/2019      Reactions   Fluoxetine Other (See Comments)   seizure   Fluoxetine Hcl Other (See Comments)   seizure   Lamotrigine Other (See Comments)   itch itch   Prozac [fluoxetine Hcl]    seizure   Statins Other (See Comments)   Muscle pains Muscle pains Muscle pains   Sulfa Antibiotics Other (See Comments)   Dyspnea   Sulfa Drugs Cross Reactors    Dyspnea      Medication List       Accurate as of January 08, 2019  2:23 PM. Always use your most recent med list.        albuterol 108 (90 Base) MCG/ACT inhaler Commonly known as:  Ventolin HFA INHALE TWO PUFFS INTO LUNGS EVERY 6 HOURS AS NEEDED FOR WHEEZING OR SHORTNESS OF BREATH   alendronate 70 MG tablet Commonly known as:  FOSAMAX Take 1 tablet (70 mg total) by mouth every 7 (seven) days. Take with a full glass of water on an empty stomach.   aspirin 81 MG tablet Take 81 mg by mouth daily.   b complex vitamins tablet Take 1 tablet by mouth daily.   budesonide-formoterol 160-4.5 MCG/ACT  inhaler Commonly known as:  Symbicort Inhale 2 puffs into the lungs 2 (two) times daily.   citalopram 10 MG tablet Commonly known as:  CELEXA Take 0.5 tablets (5 mg total) by mouth at bedtime. Taking 5 mg. Only.   ezetimibe 10 MG tablet Commonly known as:  ZETIA TAKE 1 TABLET EVERY DAY   glucose blood test strip Commonly known as:  True Metrix Blood Glucose Test Check blood sugar once a day.  Dx: E11.51.   LORazepam 0.5 MG tablet Commonly known as:  ATIVAN Take 1 tablet (0.5 mg total) by mouth 2 (two) times daily as needed.   losartan 50 MG tablet Commonly known as:  COZAAR TAKE 1 TABLET EVERY DAY   metFORMIN 1000 MG tablet Commonly known as:  GLUCOPHAGE TAKE 1 TABLET TWICE DAILY WITH MEALS   multivitamin tablet Take 1 tablet by mouth daily.   OLANZapine 5 MG tablet Commonly known as:  ZYPREXA Take 5 mg by mouth daily.   pantoprazole 40 MG tablet Commonly known as:  PROTONIX TAKE 1 TABLET EVERY DAY   rosuvastatin 5 MG tablet Commonly known as:  CRESTOR TAKE 1 TABLET BY MOUTH THREE TIMES A WEEK   Spacer/Aero Chamber Mouthpiece Misc To use with inhaler   True Metrix Air Glucose Meter Devi   TRUEplus Lancets 33G Misc TEST ONE TIME DAILY AS DIRECTED  Vitamin D 50 MCG (2000 UT) Caps Take 5,000 Units by mouth daily.       Allergies:  Allergies  Allergen Reactions  . Fluoxetine Other (See Comments)    seizure  . Fluoxetine Hcl Other (See Comments)    seizure  . Lamotrigine Other (See Comments)    itch itch  . Prozac [Fluoxetine Hcl]     seizure  . Statins Other (See Comments)    Muscle pains Muscle pains Muscle pains  . Sulfa Antibiotics Other (See Comments)    Dyspnea  . Sulfa Drugs Cross Reactors     Dyspnea     Past Medical History, Surgical history, Social history, and Family History were reviewed and updated.  Review of Systems: All other 10 point review of systems is negative.   Physical Exam:  weight is 153 lb (69.4 kg). Her oral  temperature is 98.1 F (36.7 C). Her blood pressure is 115/61 and her pulse is 110 (abnormal). Her respiration is 16 and oxygen saturation is 96%.   Wt Readings from Last 3 Encounters:  01/08/19 153 lb (69.4 kg)  01/08/19 152 lb 3.2 oz (69 kg)  10/10/18 154 lb (69.9 kg)    Ocular: Sclerae unicteric, pupils equal, round and reactive to light Ear-nose-throat: Oropharynx clear, dentition fair Lymphatic: No cervical, supraclavicular or axillary adenopathy Lungs no rales or rhonchi, good excursion bilaterally Heart regular rate and rhythm, no murmur appreciated Abd soft, nontender, positive bowel sounds, no liver or spleen tip palpated on exam, no fluid wave  MSK no focal spinal tenderness, no joint edema Neuro: non-focal, well-oriented, appropriate affect Breasts: No changes. No mass, lesion or rash noted. Left breast lumpectomy at the 6 o'clock position intact.   Lab Results  Component Value Date   WBC 9.3 01/08/2019   HGB 14.1 01/08/2019   HCT 44.7 01/08/2019   MCV 93.7 01/08/2019   PLT 237 01/08/2019   No results found for: FERRITIN, IRON, TIBC, UIBC, IRONPCTSAT Lab Results  Component Value Date   RBC 4.77 01/08/2019   No results found for: KPAFRELGTCHN, LAMBDASER, KAPLAMBRATIO No results found for: IGGSERUM, IGA, IGMSERUM No results found for: Odetta Pink, SPEI   Chemistry      Component Value Date/Time   NA 141 01/08/2019 1314   NA 139 07/02/2017 1445   NA 142 01/01/2017 1059   K 4.5 01/08/2019 1314   K 4.6 07/02/2017 1445   K 4.6 01/01/2017 1059   CL 103 01/08/2019 1314   CL 103 07/02/2017 1445   CL 105 01/20/2015 1240   CO2 26 01/08/2019 1314   CO2 28 07/02/2017 1445   CO2 26 01/01/2017 1059   BUN 12 01/08/2019 1314   BUN 10 07/02/2017 1445   BUN 10.6 01/01/2017 1059   CREATININE 0.78 01/08/2019 1314   CREATININE 0.67 07/02/2017 1445   CREATININE 0.8 01/01/2017 1059      Component Value Date/Time    CALCIUM 9.4 01/08/2019 1314   CALCIUM 10.3 07/02/2017 1445   CALCIUM 10.1 01/01/2017 1059   ALKPHOS 35 (L) 01/08/2019 1314   ALKPHOS 47 07/02/2017 1445   ALKPHOS 49 01/01/2017 1059   AST 34 01/08/2019 1314   AST 60 (H) 01/01/2017 1059   ALT 38 01/08/2019 1314   ALT 63 (H) 01/01/2017 1059   BILITOT 0.4 01/08/2019 1314   BILITOT 0.64 01/01/2017 1059       Impression and Plan: Michelle Long is a very pleasant 73 yo postmenopausal caucasian female  with history of stage IIA ductal carcinoma the left breast. She completed Tamoxifen in September 2017.  So far she continues to do well and there has been no evidence of recurrence.  We will plan to see her back in another 6 months for follow-up.  She will contact our office with any questions or concerns. We can certainly see her sooner if need be.   Laverna Peace, NP 3/20/20202:23 PM

## 2019-01-08 NOTE — Progress Notes (Signed)
Patient ID: Michelle Long, female    DOB: 03/16/1946  Age: 73 y.o. MRN: [id number]    Subjective:  Subjective  HPI Michelle Long presents for f/u dm, chol and bp.   HYPERTENSION   Blood pressure range-not checking  Chest pain- no      Dyspnea- no Lightheadedness- no   Edema- no  Other side effects - no   Medication compliance: good Low salt diet- yes    DIABETES    Blood Sugar ranges-100-120  Polyuria- no New Visual problems- no  Hypoglycemic symptoms- no  Other side effects-no Medication compliance - good Last eye exam- end of year Foot exam- today   HYPERLIPIDEMIA  Medication compliance- good RUQ pain- no  Muscle aches- no Other side effects-no      Review of Systems  Constitutional: Negative for appetite change, diaphoresis, fatigue and unexpected weight change.  Eyes: Negative for pain, redness and visual disturbance.  Respiratory: Negative for cough, chest tightness, shortness of breath and wheezing.   Cardiovascular: Negative for chest pain, palpitations and leg swelling.  Endocrine: Negative for cold intolerance, heat intolerance, polydipsia, polyphagia and polyuria.  Genitourinary: Negative for difficulty urinating, dysuria and frequency.  Neurological: Negative for dizziness, light-headedness, numbness and headaches.    History Past Medical History:  Diagnosis Date  . Asthma   . Bipolar disorder ([place])   . Bladder cancer ([place])   . Breast cancer ([place])   . Cancer of lower-inner quadrant of left female breast ([place]) 12/08/2013  . Depression   . Diabetes mellitus   . GERD (gastroesophageal reflux disease)   . Hepatitis B infection   . History of transfusion of whole blood    Approximately 2009, due to breast cancer/chemo.  Marland Kitchen Hyperlipidemia   . Personal history of chemotherapy   . Personal history of radiation therapy     She has a past surgical history that includes Tonsillectomy (1951); Appendectomy (1961); Partial hysterectomy (1991); bladder cancer  (2006); Abdominal hysterectomy; and Breast lumpectomy (Left, 2007).   Her family history includes Arthritis in her mother; Breast cancer in her mother; Cancer in her father; Diabetes in her father; Heart disease in her father; Hypertension in her mother; Throat cancer in her father; Transient ischemic attack in her mother.She reports that she quit smoking about 20 years ago. Her smoking use included cigarettes. She started smoking about 54 years ago. She has a 49.50 pack-year smoking history. She has never used smokeless tobacco. She reports current alcohol use. She reports that she does not use drugs.  Current Outpatient Medications on File Prior to Visit  Medication Sig Dispense Refill  . alendronate (FOSAMAX) 70 MG tablet Take 1 tablet (70 mg total) by mouth every 7 (seven) days. Take with a full glass of water on an empty stomach. 12 tablet 3  . aspirin 81 MG tablet Take 81 mg by mouth daily.    Marland Kitchen b complex vitamins tablet Take 1 tablet by mouth daily.    . Blood Glucose Monitoring Suppl (TRUE METRIX AIR GLUCOSE METER) DEVI     . budesonide-formoterol (SYMBICORT) 160-4.5 MCG/ACT inhaler Inhale 2 puffs into the lungs 2 (two) times daily. 10.2 g 0  . Cholecalciferol (VITAMIN D) 2000 units CAPS Take 5,000 Units by mouth daily.     . citalopram (CELEXA) 10 MG tablet Take 0.5 tablets (5 mg total) by mouth at bedtime. Taking 5 mg. Only. 90 tablet 1  . ezetimibe (ZETIA) 10 MG tablet TAKE 1 TABLET EVERY DAY 90 tablet 1  .  glucose blood (TRUE METRIX BLOOD GLUCOSE TEST) test strip Check blood sugar once a day.  Dx: E11.51. 100 each 1  . Lancets Misc. MISC Trueplus 33g lancets  Use as directed once a day 100 each 1  . LORazepam (ATIVAN) 0.5 MG tablet Take 1 tablet (0.5 mg total) by mouth 2 (two) times daily as needed. 90 tablet 0  . losartan (COZAAR) 50 MG tablet TAKE 1 TABLET EVERY DAY 90 tablet 1  . metFORMIN (GLUCOPHAGE) 1000 MG tablet TAKE 1 TABLET TWICE DAILY WITH MEALS 60 tablet 0  . Multiple  Vitamin (MULTIVITAMIN) tablet Take 1 tablet by mouth daily.    Marland Kitchen OLANZapine (ZYPREXA) 5 MG tablet Take 5 mg by mouth daily.    . pantoprazole (PROTONIX) 40 MG tablet TAKE 1 TABLET EVERY DAY 90 tablet 1  . rosuvastatin (CRESTOR) 5 MG tablet TAKE 1 TABLET BY MOUTH THREE TIMES A WEEK 36 tablet 1  . Spacer/Aero Chamber Mouthpiece MISC To use with inhaler 1 each 1  . TRUEPLUS LANCETS 33G MISC TEST ONE TIME DAILY AS DIRECTED 100 each 1   No current facility-administered medications on file prior to visit.      Objective:  Objective  Physical Exam Vitals signs and nursing note reviewed.  Constitutional:      Appearance: She is well-developed.  HENT:     Head: Normocephalic and atraumatic.  Eyes:     Conjunctiva/sclera: Conjunctivae normal.  Neck:     Musculoskeletal: Normal range of motion and neck supple.     Thyroid: No thyromegaly.     Vascular: No carotid bruit or JVD.  Cardiovascular:     Rate and Rhythm: Normal rate and regular rhythm.     Heart sounds: Normal heart sounds. No murmur.  Pulmonary:     Effort: Pulmonary effort is normal. No respiratory distress.     Breath sounds: Normal breath sounds. No wheezing or rales.  Chest:     Chest wall: No tenderness.  Neurological:     Mental Status: She is alert and oriented to person, place, and time.    Diabetic Foot Exam - Simple   Simple Foot Form Diabetic Foot exam was performed with the following findings:  Yes 01/08/2019 11:53 AM  Visual Inspection No deformities, no ulcerations, no other skin breakdown bilaterally:  Yes Sensation Testing Intact to touch and monofilament testing bilaterally:  Yes Pulse Check Posterior Tibialis and Dorsalis pulse intact bilaterally:  Yes Comments     BP 126/70 (BP Location: Right Arm, Cuff Size: Normal)   Pulse (!) 106   Temp 98.2 F (36.8 C) (Oral)   Resp 16   Ht 5' 3.5" (1.613 m)   Wt 152 lb 3.2 oz (69 kg)   SpO2 96%   BMI 26.54 kg/m  Wt Readings from Last 3 Encounters:   01/08/19 152 lb 3.2 oz (69 kg)  10/10/18 154 lb (69.9 kg)  07/10/18 157 lb (71.2 kg)     Lab Results  Component Value Date   WBC 7.2 07/10/2018   HGB 13.7 07/10/2018   HCT 42.7 07/10/2018   PLT 200 07/10/2018   GLUCOSE 136 (H) 07/10/2018   CHOL 102 07/09/2018   TRIG 108.0 07/09/2018   HDL 32.80 (L) 07/09/2018   LDLDIRECT 145.6 06/22/2013   LDLCALC 48 07/09/2018   ALT 35 07/10/2018   AST 36 07/10/2018   NA 142 07/10/2018   K 4.4 07/10/2018   CL 106 07/10/2018   CREATININE 0.90 07/10/2018   BUN 9 07/10/2018  CO2 28 07/10/2018   TSH 0.80 01/30/2015   INR 0.97 09/25/2011   HGBA1C 6.9 (H) 07/09/2018   MICROALBUR 1.9 01/01/2018    Dg Chest 2 View  Result Date: 10/10/2018 CLINICAL DATA:  Chronic shortness of breath over the last month, worsening today. EXAM: CHEST - 2 VIEW COMPARISON:  None. FINDINGS: Heart size is normal. There is aortic atherosclerosis. There is some atelectasis in the perihilar regions, particularly in the lingula. Lingular pneumonia is possible. No effusions. Previous left mastectomy. IMPRESSION: Volume loss in the lingula.  Possible lingular pneumonia. Electronically Signed   By: Nelson Chimes M.D.   On: 10/10/2018 21:40     Assessment & Plan:  Plan  I have discontinued Michelle Long. Michelle Long, doxycycline, and predniSONE. I am also having her maintain her aspirin, OLANZapine, True Metrix Air Glucose Meter, Vitamin D, b complex vitamins, LORazepam, citalopram, Spacer/Aero Chamber Mouthpiece, Lancets Misc., multivitamin, alendronate, glucose blood, pantoprazole, losartan, metFORMIN, TRUEplus Lancets 33G, rosuvastatin, ezetimibe, budesonide-formoterol, and albuterol.  Meds ordered this encounter  Medications  . albuterol (VENTOLIN HFA) 108 (90 Base) MCG/ACT inhaler    Sig: INHALE TWO PUFFS INTO LUNGS EVERY 6 HOURS AS NEEDED FOR WHEEZING OR SHORTNESS OF BREATH    Dispense:  70 each    Refill:  1    Please consider 90 day supplies to  promote better adherence    Problem List Items Addressed This Visit      Unprioritized   Asthma - Primary   Relevant Medications   albuterol (VENTOLIN HFA) 108 (90 Base) MCG/ACT inhaler   Diet-controlled diabetes mellitus ([place])    Check labs  con't diet       Relevant Orders   Hemoglobin A1c   Lipid panel   Comprehensive metabolic panel   Essential hypertension    Well controlled, no changes to meds. Encouraged heart healthy diet such as the DASH diet and exercise as tolerated.       Relevant Orders   Hemoglobin A1c   Lipid panel   Comprehensive metabolic panel   Hyperlipidemia associated with type 2 diabetes mellitus ([place])    Tolerating statin, encouraged heart healthy diet, avoid trans fats, minimize simple carbs and saturated fats. Increase exercise as tolerated      Relevant Orders   Hemoglobin A1c   Lipid panel   Comprehensive metabolic panel      Follow-up: Return in about 6 months (around 07/11/2019) for annual exam, fasting.  Ann Held, DO

## 2019-01-08 NOTE — Assessment & Plan Note (Signed)
Well controlled, no changes to meds. Encouraged heart healthy diet such as the DASH diet and exercise as tolerated.  °

## 2019-01-08 NOTE — Assessment & Plan Note (Signed)
Tolerating statin, encouraged heart healthy diet, avoid trans fats, minimize simple carbs and saturated fats. Increase exercise as tolerated 

## 2019-01-11 ENCOUNTER — Other Ambulatory Visit: Payer: Self-pay | Admitting: Family Medicine

## 2019-01-11 DIAGNOSIS — E1165 Type 2 diabetes mellitus with hyperglycemia: Secondary | ICD-10-CM

## 2019-01-11 DIAGNOSIS — E785 Hyperlipidemia, unspecified: Secondary | ICD-10-CM

## 2019-01-11 MED ORDER — CITALOPRAM HYDROBROMIDE 10 MG PO TABS: 5.0000 mg | ORAL_TABLET | Freq: Every day | ORAL | 1 refills | Status: AC

## 2019-01-12 ENCOUNTER — Telehealth: Payer: Self-pay | Admitting: Hematology & Oncology

## 2019-01-12 ENCOUNTER — Other Ambulatory Visit: Payer: Self-pay | Admitting: Family Medicine

## 2019-01-12 ENCOUNTER — Encounter: Payer: Self-pay | Admitting: Family Medicine

## 2019-01-12 NOTE — Telephone Encounter (Signed)
Appointments scheduled letter/calendar mailed per 3/20 los

## 2019-01-13 ENCOUNTER — Other Ambulatory Visit: Payer: Self-pay | Admitting: Family Medicine

## 2019-01-13 NOTE — Telephone Encounter (Signed)
Yes repeat labs in 3 months

## 2019-01-15 ENCOUNTER — Other Ambulatory Visit: Payer: Self-pay | Admitting: *Deleted

## 2019-01-15 MED ORDER — SITAGLIPTIN PHOSPHATE 100 MG PO TABS: 100.0000 mg | ORAL_TABLET | Freq: Every day | ORAL | 2 refills | Status: DC

## 2019-01-21 ENCOUNTER — Other Ambulatory Visit: Payer: Self-pay | Admitting: Family Medicine

## 2019-01-21 DIAGNOSIS — K219 Gastro-esophageal reflux disease without esophagitis: Secondary | ICD-10-CM

## 2019-01-21 DIAGNOSIS — I10 Essential (primary) hypertension: Secondary | ICD-10-CM

## 2019-02-10 DIAGNOSIS — F250 Schizoaffective disorder, bipolar type: Secondary | ICD-10-CM | POA: Diagnosis not present

## 2019-02-24 ENCOUNTER — Other Ambulatory Visit: Payer: Self-pay | Admitting: Family Medicine

## 2019-03-11 ENCOUNTER — Ambulatory Visit: Payer: Medicare HMO | Admitting: *Deleted

## 2019-03-25 ENCOUNTER — Other Ambulatory Visit: Payer: Self-pay | Admitting: *Deleted

## 2019-03-25 MED ORDER — EZETIMIBE 10 MG PO TABS: 10.0000 mg | ORAL_TABLET | Freq: Every day | ORAL | 1 refills | Status: DC

## 2019-04-08 ENCOUNTER — Other Ambulatory Visit: Payer: Self-pay | Admitting: Family Medicine

## 2019-04-16 ENCOUNTER — Other Ambulatory Visit: Payer: Self-pay | Admitting: Family Medicine

## 2019-04-19 ENCOUNTER — Other Ambulatory Visit: Payer: Self-pay

## 2019-04-19 ENCOUNTER — Other Ambulatory Visit (INDEPENDENT_AMBULATORY_CARE_PROVIDER_SITE_OTHER): Payer: Medicare HMO

## 2019-04-19 DIAGNOSIS — E785 Hyperlipidemia, unspecified: Secondary | ICD-10-CM | POA: Diagnosis not present

## 2019-04-19 DIAGNOSIS — E1165 Type 2 diabetes mellitus with hyperglycemia: Secondary | ICD-10-CM

## 2019-04-19 LAB — COMPREHENSIVE METABOLIC PANEL
ALT: 49 U/L — ABNORMAL HIGH (ref 0–35)
AST: 46 U/L — ABNORMAL HIGH (ref 0–37)
Albumin: 4.4 g/dL (ref 3.5–5.2)
Alkaline Phosphatase: 37 U/L — ABNORMAL LOW (ref 39–117)
BUN: 9 mg/dL (ref 6–23)
CO2: 28 mEq/L (ref 19–32)
Calcium: 9.3 mg/dL (ref 8.4–10.5)
Chloride: 102 mEq/L (ref 96–112)
Creatinine, Ser: 0.75 mg/dL (ref 0.40–1.20)
GFR: 75.74 mL/min (ref >60.00)
Glucose, Bld: 107 mg/dL — ABNORMAL HIGH (ref 70–99)
Potassium: 4.2 mEq/L (ref 3.5–5.1)
Sodium: 140 mEq/L (ref 135–145)
Total Bilirubin: 0.4 mg/dL (ref 0.2–1.2)
Total Protein: 6.8 g/dL (ref 6.0–8.3)

## 2019-04-19 LAB — HEMOGLOBIN A1C: Hgb A1c MFr Bld: 6.8 % — ABNORMAL HIGH (ref 4.6–6.5)

## 2019-04-19 LAB — LIPID PANEL
Cholesterol: 148 mg/dL (ref 0–200)
HDL: 48.9 mg/dL (ref >39.00)
LDL Cholesterol: 69 mg/dL (ref 0–99)
NonHDL: 99.54
Total CHOL/HDL Ratio: 3
Triglycerides: 152 mg/dL — ABNORMAL HIGH (ref 0.0–149.0)
VLDL: 30.4 mg/dL (ref 0.0–40.0)

## 2019-04-20 LAB — MICROALBUMIN / CREATININE URINE RATIO
Creatinine,U: 186.3 mg/dL
Microalb Creat Ratio: 3.7 mg/g (ref 0.0–30.0)
Microalb, Ur: 6.9 mg/dL — ABNORMAL HIGH (ref 0.0–1.9)

## 2019-05-03 DIAGNOSIS — R748 Abnormal levels of other serum enzymes: Secondary | ICD-10-CM | POA: Diagnosis not present

## 2019-05-08 ENCOUNTER — Other Ambulatory Visit: Payer: Self-pay | Admitting: Family Medicine

## 2019-05-10 ENCOUNTER — Encounter: Payer: Self-pay | Admitting: Family

## 2019-05-10 ENCOUNTER — Encounter: Payer: Self-pay | Admitting: Family Medicine

## 2019-05-10 ENCOUNTER — Other Ambulatory Visit: Payer: Self-pay | Admitting: Family Medicine

## 2019-05-10 DIAGNOSIS — Z1231 Encounter for screening mammogram for malignant neoplasm of breast: Secondary | ICD-10-CM

## 2019-05-15 ENCOUNTER — Other Ambulatory Visit: Payer: Self-pay | Admitting: Family Medicine

## 2019-05-19 ENCOUNTER — Other Ambulatory Visit: Payer: Self-pay | Admitting: Family Medicine

## 2019-05-20 ENCOUNTER — Telehealth: Payer: Self-pay | Admitting: Family Medicine

## 2019-05-20 NOTE — Telephone Encounter (Signed)
[name] name: Relation to pt: self Call back number: [phone]  Pharmacy:  Reason for call:  Patient states pharmacy denies receiving alendronate (FOSAMAX) 70 MG tablet 3 month supply prescription, please resend new rx

## 2019-05-21 ENCOUNTER — Other Ambulatory Visit: Payer: Self-pay | Admitting: Family Medicine

## 2019-05-25 ENCOUNTER — Other Ambulatory Visit: Payer: Self-pay | Admitting: *Deleted

## 2019-05-25 MED ORDER — ALENDRONATE SODIUM 70 MG PO TABS: ORAL_TABLET | ORAL | 1 refills | Status: DC

## 2019-05-28 ENCOUNTER — Other Ambulatory Visit: Payer: Self-pay | Admitting: Family Medicine

## 2019-05-28 DIAGNOSIS — K219 Gastro-esophageal reflux disease without esophagitis: Secondary | ICD-10-CM

## 2019-05-28 DIAGNOSIS — I10 Essential (primary) hypertension: Secondary | ICD-10-CM

## 2019-06-25 ENCOUNTER — Ambulatory Visit
Admission: RE | Admit: 2019-06-25 | Discharge: 2019-06-25 | Disposition: A | Discharge status: Home / Self Care | Payer: Medicare HMO | Source: Ambulatory Visit | Attending: Family Medicine | Admitting: Family Medicine

## 2019-06-25 ENCOUNTER — Other Ambulatory Visit: Payer: Self-pay

## 2019-06-25 DIAGNOSIS — Z1231 Encounter for screening mammogram for malignant neoplasm of breast: Secondary | ICD-10-CM

## 2019-06-25 IMAGING — MG MM DIGITAL SCREENING BILAT W/ TOMO W/ CAD
8 series · 9 of 24 positions shown · non-contrast
Comparison: Previous exam(s).

CLINICAL DATA: Screening.

EXAM:
DIGITAL SCREENING BILATERAL MAMMOGRAM WITH TOMO AND CAD

[R MLO synth-2D]
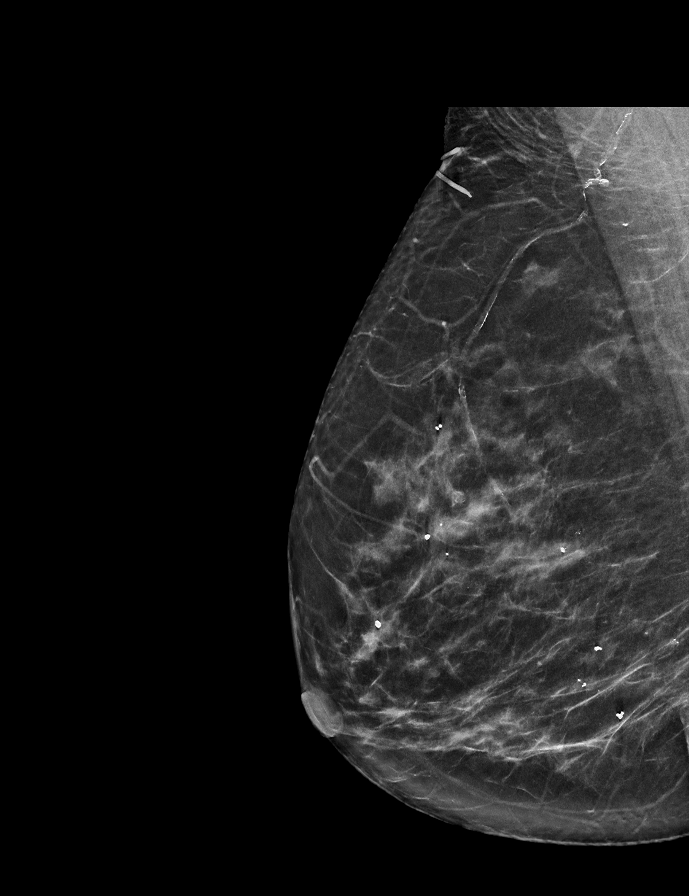

[L MLO synth-2D]
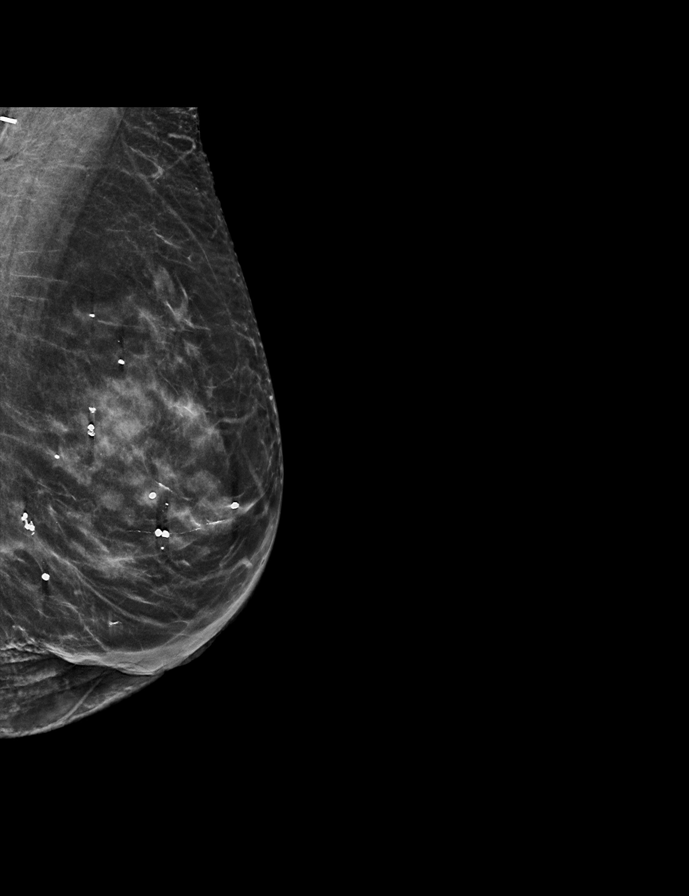

[L CC synth-2D]
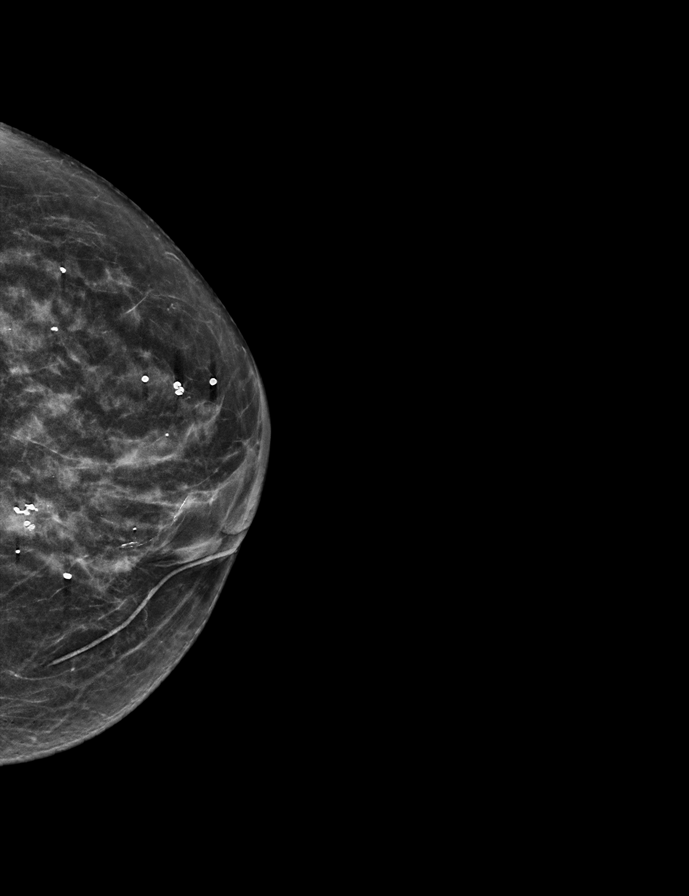

[R CC synth-2D]
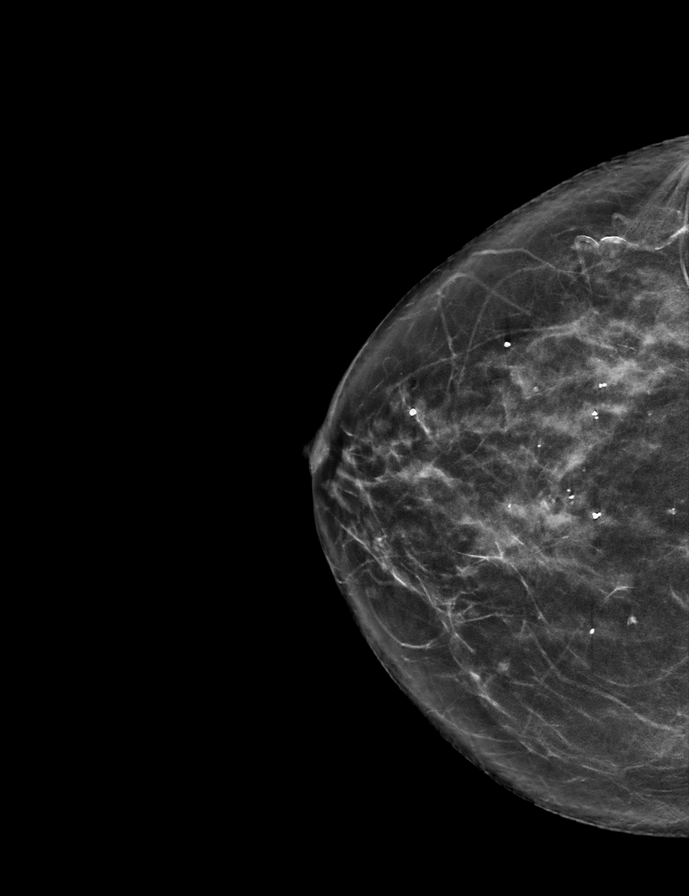

[R CC tomo · 2 of 64 frames shown]
[frame 21/64]
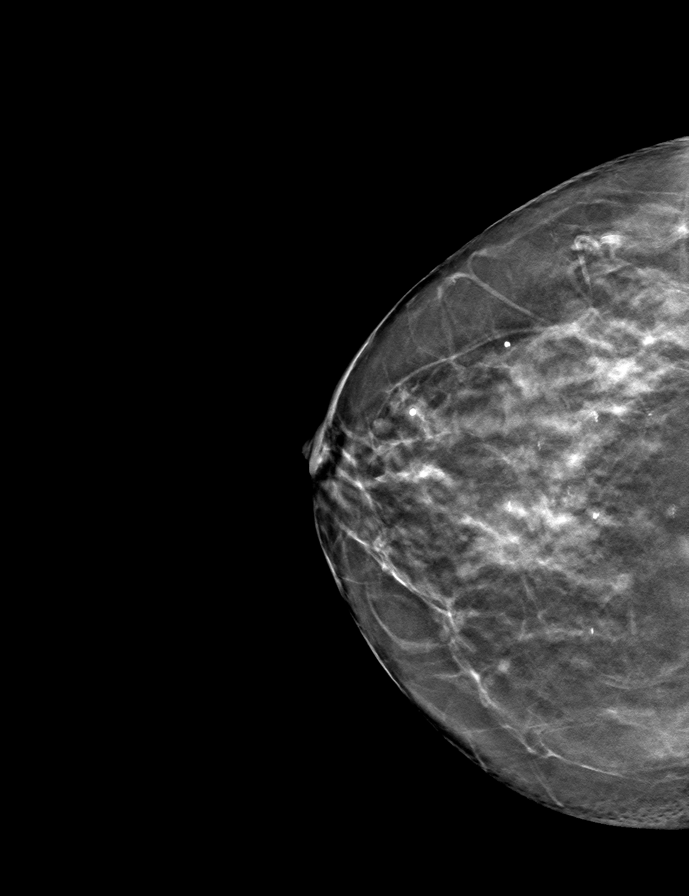
[frame 33/64]
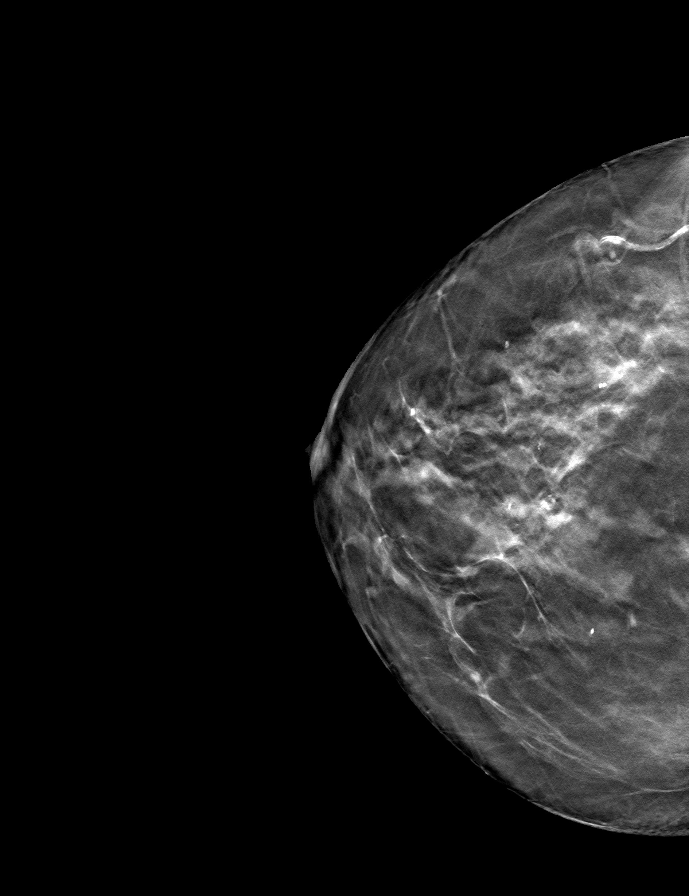

[R MLO tomo · tomo slice 33/66.0]
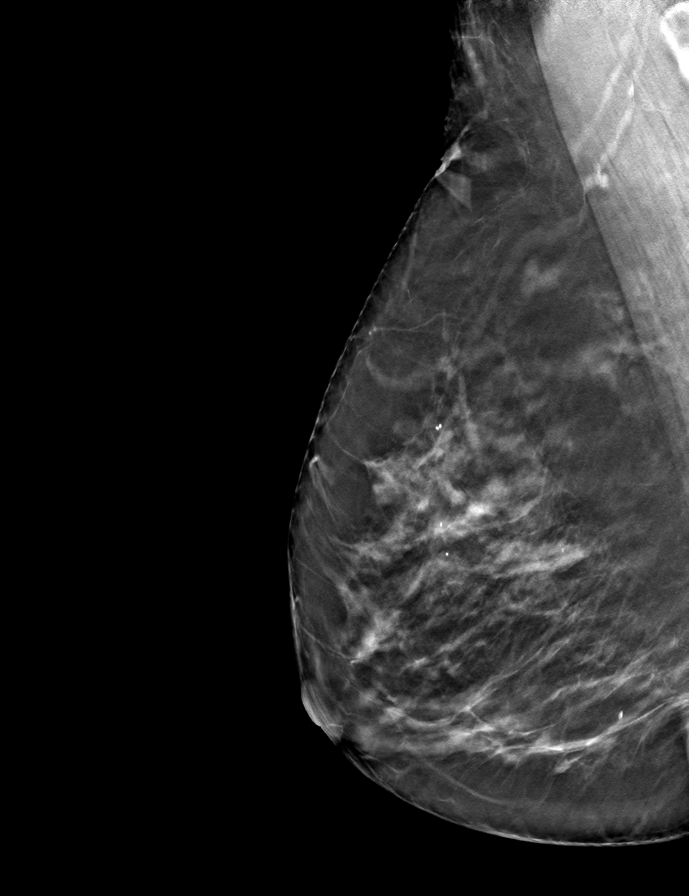

[L MLO tomo · tomo slice 28/55.0]
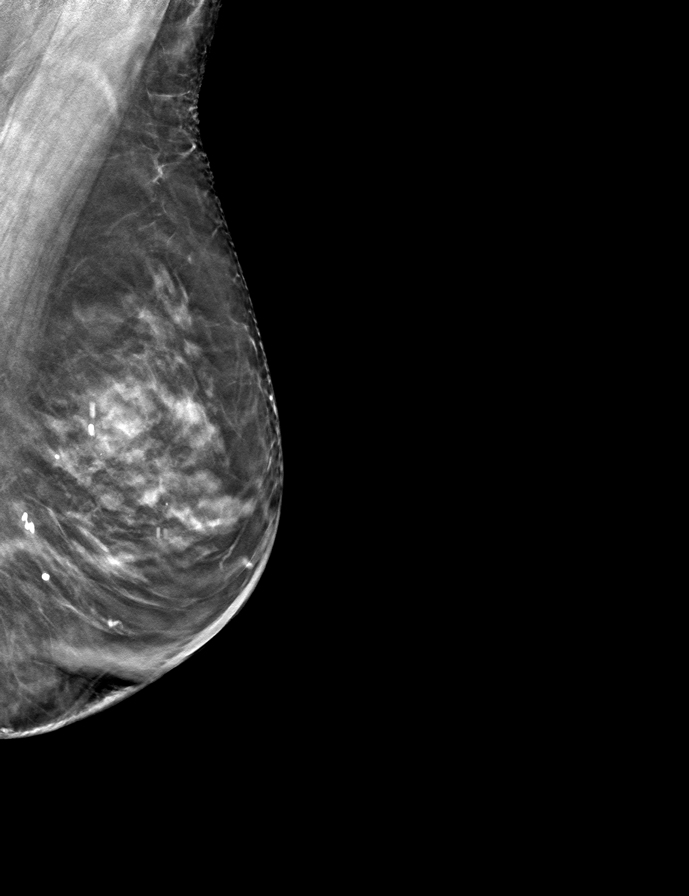

[L CC tomo · tomo slice 25/50.0]
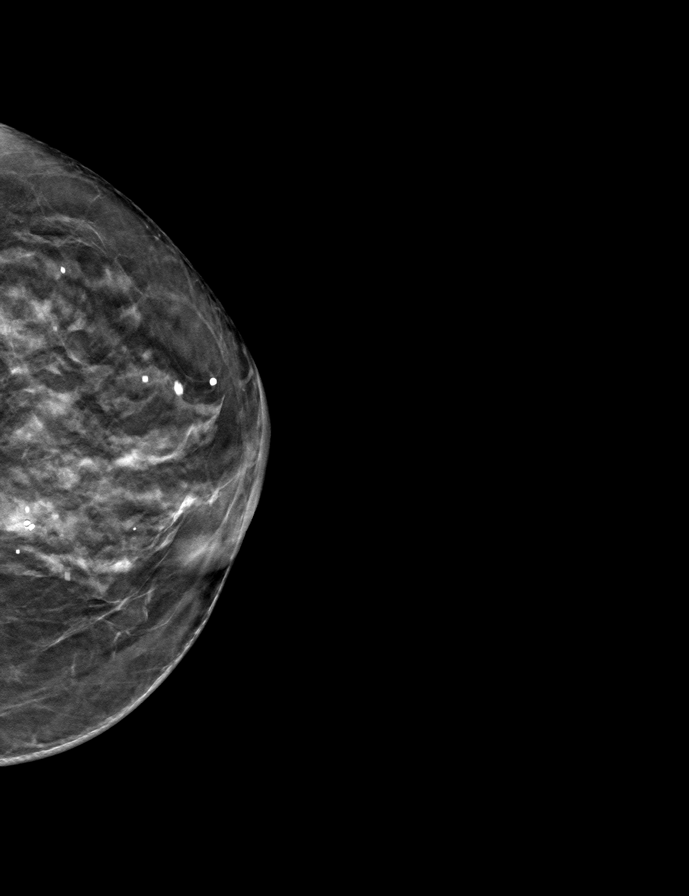

[9 of 24 positions shown; findings below may reference images not displayed]

ACR Breast Density Category c: The breast tissue is heterogeneously
dense, which may obscure small masses.
FINDINGS: There are no findings suspicious for malignancy. Images were
processed with CAD.
IMPRESSION: No mammographic evidence of malignancy. A result letter of this
screening mammogram will be mailed directly to the patient.

RECOMMENDATION:
Screening mammogram in one year. (Code:[5V])

BI-RADS CATEGORY  1: Negative.

## 2019-07-12 ENCOUNTER — Other Ambulatory Visit: Payer: Self-pay

## 2019-07-13 ENCOUNTER — Encounter: Payer: Self-pay | Admitting: Family Medicine

## 2019-07-13 ENCOUNTER — Ambulatory Visit (INDEPENDENT_AMBULATORY_CARE_PROVIDER_SITE_OTHER): Payer: Medicare HMO | Admitting: Family Medicine

## 2019-07-13 VITALS — BP 124/68 | HR 105 | Temp 97.9°F | Resp 18 | Ht 63.5 in | Wt 153.0 lb

## 2019-07-13 DIAGNOSIS — Z0000 Encounter for general adult medical examination without abnormal findings: Secondary | ICD-10-CM

## 2019-07-13 DIAGNOSIS — E1169 Type 2 diabetes mellitus with other specified complication: Secondary | ICD-10-CM

## 2019-07-13 DIAGNOSIS — E1165 Type 2 diabetes mellitus with hyperglycemia: Secondary | ICD-10-CM | POA: Diagnosis not present

## 2019-07-13 DIAGNOSIS — E785 Hyperlipidemia, unspecified: Secondary | ICD-10-CM

## 2019-07-13 DIAGNOSIS — Z23 Encounter for immunization: Secondary | ICD-10-CM

## 2019-07-13 DIAGNOSIS — I10 Essential (primary) hypertension: Secondary | ICD-10-CM

## 2019-07-13 LAB — COMPREHENSIVE METABOLIC PANEL
ALT: 51 U/L — ABNORMAL HIGH (ref 0–35)
AST: 41 U/L — ABNORMAL HIGH (ref 0–37)
Albumin: 4.5 g/dL (ref 3.5–5.2)
Alkaline Phosphatase: 39 U/L (ref 39–117)
BUN: 12 mg/dL (ref 6–23)
CO2: 30 mEq/L (ref 19–32)
Calcium: 10.3 mg/dL (ref 8.4–10.5)
Chloride: 99 mEq/L (ref 96–112)
Creatinine, Ser: 0.74 mg/dL (ref 0.40–1.20)
GFR: 76.87 mL/min (ref >60.00)
Glucose, Bld: 120 mg/dL — ABNORMAL HIGH (ref 70–99)
Potassium: 4.5 mEq/L (ref 3.5–5.1)
Sodium: 140 mEq/L (ref 135–145)
Total Bilirubin: 0.5 mg/dL (ref 0.2–1.2)
Total Protein: 7 g/dL (ref 6.0–8.3)

## 2019-07-13 LAB — LIPID PANEL
Cholesterol: 139 mg/dL (ref 0–200)
HDL: 45.1 mg/dL (ref >39.00)
LDL Cholesterol: 63 mg/dL (ref 0–99)
NonHDL: 93.52
Total CHOL/HDL Ratio: 3
Triglycerides: 153 mg/dL — ABNORMAL HIGH (ref 0.0–149.0)
VLDL: 30.6 mg/dL (ref 0.0–40.0)

## 2019-07-13 LAB — HEMOGLOBIN A1C: Hgb A1c MFr Bld: 7.2 % — ABNORMAL HIGH (ref 4.6–6.5)

## 2019-07-13 MED ORDER — BUDESONIDE-FORMOTEROL FUMARATE 160-4.5 MCG/ACT IN AERO: 2.0000 | INHALATION_SPRAY | Freq: Two times a day (BID) | RESPIRATORY_TRACT | 3 refills | Status: DC

## 2019-07-13 NOTE — Assessment & Plan Note (Signed)
Well controlled, no changes to meds. Encouraged heart healthy diet such as the DASH diet and exercise as tolerated.  °

## 2019-07-13 NOTE — Assessment & Plan Note (Signed)
See

## 2019-07-13 NOTE — Assessment & Plan Note (Signed)
hgba1c to be checked, minimize simple carbs. Increase exercise as tolerated. Continue current meds  

## 2019-07-13 NOTE — Assessment & Plan Note (Signed)
Tolerating statin, encouraged heart healthy diet, avoid trans fats, minimize simple carbs and saturated fats. Increase exercise as tolerated 

## 2019-07-13 NOTE — Patient Instructions (Signed)
Preventive Care 73 Years and Older, Female Preventive care refers to lifestyle choices and visits with your health care provider that can promote health and wellness. This includes:  A yearly physical exam. This is also called an annual well check.  Regular dental and eye exams.  Immunizations.  Screening for certain conditions.  Healthy lifestyle choices, such as diet and exercise. What can I expect for my preventive care visit? Physical exam Your health care provider will check:  Height and weight. These may be used to calculate body mass index (BMI), which is a measurement that tells if you are at a healthy weight.  Heart rate and blood pressure.  Your skin for abnormal spots. Counseling Your health care provider may ask you questions about:  Alcohol, tobacco, and drug use.  Emotional well-being.  Home and relationship well-being.  Sexual activity.  Eating habits.  History of falls.  Memory and ability to understand (cognition).  Work and work Statistician.  Pregnancy and menstrual history. What immunizations do I need?  Influenza (flu) vaccine  This is recommended every year. Tetanus, diphtheria, and pertussis (Tdap) vaccine  You may need a Td booster every 10 years. Varicella (chickenpox) vaccine  You may need this vaccine if you have not already been vaccinated. Zoster (shingles) vaccine  You may need this after age 73. Pneumococcal conjugate (PCV13) vaccine  One dose is recommended after age 73. Pneumococcal polysaccharide (PPSV23) vaccine  One dose is recommended after age 73. Measles, mumps, and rubella (MMR) vaccine  You may need at least one dose of MMR if you were born in 1957 or later. You may also need a second dose. Meningococcal conjugate (MenACWY) vaccine  You may need this if you have certain conditions. Hepatitis A vaccine  You may need this if you have certain conditions or if you travel or work in places where you may be exposed  to hepatitis A. Hepatitis B vaccine  You may need this if you have certain conditions or if you travel or work in places where you may be exposed to hepatitis B. Haemophilus influenzae type b (Hib) vaccine  You may need this if you have certain conditions. You may receive vaccines as individual doses or as more than one vaccine together in one shot (combination vaccines). Talk with your health care provider about the risks and benefits of combination vaccines. What tests do I need? Blood tests  Lipid and cholesterol levels. These may be checked every 5 years, or more frequently depending on your overall health.  Hepatitis C test.  Hepatitis B test. Screening  Lung cancer screening. You may have this screening every year starting at age 39 if you have a 30-pack-year history of smoking and currently smoke or have quit within the past 15 years.  Colorectal cancer screening. All adults should have this screening starting at age 36 and continuing until age 15. Your health care provider may recommend screening at age 23 if you are at increased risk. You will have tests every 1-10 years, depending on your results and the type of screening test.  Diabetes screening. This is done by checking your blood sugar (glucose) after you have not eaten for a while (fasting). You may have this done every 1-3 years.  Mammogram. This may be done every 1-2 years. Talk with your health care provider about how often you should have regular mammograms.  BRCA-related cancer screening. This may be done if you have a family history of breast, ovarian, tubal, or peritoneal cancers.  Other tests  Sexually transmitted disease (STD) testing.  Bone density scan. This is done to screen for osteoporosis. You may have this done starting at age 55. Follow these instructions at home: Eating and drinking  Eat a diet that includes fresh fruits and vegetables, whole grains, lean protein, and low-fat dairy products. Limit  your intake of foods with high amounts of sugar, saturated fats, and salt.  Take vitamin and mineral supplements as recommended by your health care provider.  Do not drink alcohol if your health care provider tells you not to drink.  If you drink alcohol: ? Limit how much you have to 0-1 drink a day. ? Be aware of how much alcohol is in your drink. In the [place] one drink equals one 12 oz bottle of beer (355 mL), one 5 oz glass of wine (148 mL), or one 1 oz glass of hard liquor (44 mL). Lifestyle  Take daily care of your teeth and gums.  Stay active. Exercise for at least 30 minutes on 5 or more days each week.  Do not use any products that contain nicotine or tobacco, such as cigarettes, e-cigarettes, and chewing tobacco. If you need help quitting, ask your health care provider.  If you are sexually active, practice safe sex. Use a condom or other form of protection in order to prevent STIs (sexually transmitted infections).  Talk with your health care provider about taking a low-dose aspirin or statin. What's next?  Go to your health care provider once a year for a well check visit.  Ask your health care provider how often you should have your eyes and teeth checked.  Stay up to date on all vaccines. This information is not intended to replace advice given to you by your health care provider. Make sure you discuss any questions you have with your health care provider. Document Released: 11/03/2015 Document Revised: 10/01/2018 Document Reviewed: 10/01/2018 Elsevier Patient Education  2020 [employer].

## 2019-07-13 NOTE — Progress Notes (Signed)
Subjective:     Michelle Long is a 73 y.o. female and is here for a comprehensive physical exam. The patient reports no problems.  She will need labs and refills  HYPERTENSION   Blood pressure range-not checking   Chest pain- no      Dyspnea- no Lightheadedness- no   Edema- no  Other side effects - no   Medication compliance: good Low salt diet- no    DIABETES    Blood Sugar ranges-good per pt  Polyuria- no New Visual problems- no  Hypoglycemic symptoms- no  Other side effects-no Medication compliance - good Last eye exam- due in nov Foot exam- today   HYPERLIPIDEMIA  Medication compliance- good  RUQ pain- no  Muscle aches- no Other side effects-no     Social History   Socioeconomic History  . Marital status: Single    Spouse name: Not on file  . Number of children: Not on file  . Years of education: Not on file  . Highest education level: Not on file  Occupational History    Comment: retired  Scientific laboratory technician  . Financial resource strain: Not on file  . Food insecurity    Worry: Not on file    Inability: Not on file  . Transportation needs    Medical: Not on file    Non-medical: Not on file  Tobacco Use  . Smoking status: Former Smoker    Packs/day: 1.50    Years: 33.00    Pack years: 49.50    Types: Cigarettes    Start date: 12/08/1964    Quit date: 08/21/1998    Years since quitting: 20.9  . Smokeless tobacco: Never Used  . Tobacco comment: quit 16 years ago  Substance and Sexual Activity  . Alcohol use: Yes    Alcohol/week: 0.0 standard drinks    Comment: very rarely  . Drug use: No  . Sexual activity: Not Currently    Partners: Male  Lifestyle  . Physical activity    Days per week: Not on file    Minutes per session: Not on file  . Stress: Not on file  Relationships  . Social Herbalist on phone: Not on file    Gets together: Not on file    Attends religious service: Not on file    Active member of club or organization: Not on file     Attends meetings of clubs or organizations: Not on file    Relationship status: Not on file  . Intimate partner violence    Fear of current or ex partner: Not on file    Emotionally abused: Not on file    Physically abused: Not on file    Forced sexual activity: Not on file  Other Topics Concern  . Not on file  Social History Narrative   Exercise--- walks with neighbor   Health Maintenance  Topic Date Due  . COLONOSCOPY  01/15/2018  . OPHTHALMOLOGY EXAM  08/06/2019  . HEMOGLOBIN A1C  10/19/2019  . FOOT EXAM  01/08/2020  . MAMMOGRAM  06/24/2021  . TETANUS/TDAP  09/19/2025  . INFLUENZA VACCINE  Completed  . DEXA SCAN  Completed  . Hepatitis C Screening  Completed  . PNA vac Low Risk Adult  Completed    The following portions of the patient's history were reviewed and updated as appropriate: She  has a past medical history of Asthma, Bipolar disorder ([place]), Bladder cancer ([place]), Breast cancer ([place]), Cancer of lower-inner quadrant of  left female breast ([place]) (12/08/2013), Depression, Diabetes mellitus, GERD (gastroesophageal reflux disease), Hepatitis B infection, History of transfusion of whole blood, Hyperlipidemia, Personal history of chemotherapy, and Personal history of radiation therapy. She does not have any pertinent problems on file. She  has a past surgical history that includes Tonsillectomy (1951); Appendectomy (1961); Partial hysterectomy (1991); bladder cancer (2006); Abdominal hysterectomy; and Breast lumpectomy (Left, 2007). Her family history includes Arthritis in her mother; Breast cancer in her mother; Cancer in her father; Diabetes in her father; Heart disease in her father; Hypertension in her mother; Throat cancer in her father; Transient ischemic attack in her mother. She  reports that she quit smoking about 20 years ago. Her smoking use included cigarettes. She started smoking about 54 years ago. She has a 49.50 pack-year smoking history. She has never used  smokeless tobacco. She reports current alcohol use. She reports that she does not use drugs. She has a current medication list which includes the following prescription(s): albuterol, alendronate, aspirin, b complex vitamins, true metrix air glucose meter, budesonide-formoterol, vitamin d, citalopram, ezetimibe, lorazepam, losartan, metformin, multivitamin, olanzapine, pantoprazole, rosuvastatin, sitagliptin, spacer/aero chamber mouthpiece, true metrix blood glucose test, and trueplus lancets 33g. Current Outpatient Medications on File Prior to Visit  Medication Sig Dispense Refill  . albuterol (VENTOLIN HFA) 108 (90 Base) MCG/ACT inhaler INHALE TWO PUFFS INTO LUNGS EVERY 6 HOURS AS NEEDED FOR WHEEZING OR SHORTNESS OF BREATH 54 each 1  . alendronate (FOSAMAX) 70 MG tablet TAKE 1 TABLET BY MOUTH ONCE A WEEK WITH  A  FULL  GLASS  OF  WATER  ON  AN  EMPTY  STOMACH 12 tablet 1  . aspirin 81 MG tablet Take 81 mg by mouth daily.    Marland Kitchen b complex vitamins tablet Take 1 tablet by mouth daily.    . Blood Glucose Monitoring Suppl (TRUE METRIX AIR GLUCOSE METER) DEVI     . Cholecalciferol (VITAMIN D) 2000 units CAPS Take 5,000 Units by mouth daily.     . citalopram (CELEXA) 10 MG tablet Take 0.5 tablets (5 mg total) by mouth at bedtime. 45 tablet 1  . ezetimibe (ZETIA) 10 MG tablet Take 1 tablet (10 mg total) by mouth daily. 90 tablet 1  . LORazepam (ATIVAN) 0.5 MG tablet Take 1 tablet (0.5 mg total) by mouth 2 (two) times daily as needed. 90 tablet 0  . losartan (COZAAR) 50 MG tablet TAKE 1 TABLET EVERY DAY 90 tablet 1  . metFORMIN (GLUCOPHAGE) 1000 MG tablet TAKE 1 TABLET TWO TIMES DAILY WITH A MEAL. 180 tablet 1  . Multiple Vitamin (MULTIVITAMIN) tablet Take 1 tablet by mouth daily.    Marland Kitchen OLANZapine (ZYPREXA) 5 MG tablet Take 5 mg by mouth daily.    . pantoprazole (PROTONIX) 40 MG tablet TAKE 1 TABLET EVERY DAY 90 tablet 1  . rosuvastatin (CRESTOR) 5 MG tablet TAKE 1 TABLET BY MOUTH THREE TIMES A WEEK 36  tablet 1  . sitaGLIPtin (JANUVIA) 100 MG tablet Take 1 tablet (100 mg total) by mouth daily. 30 tablet 2  . Spacer/Aero Chamber Mouthpiece MISC To use with inhaler 1 each 1  . TRUE METRIX BLOOD GLUCOSE TEST test strip CHECK BLOOD SUGAR ONE TIME DAILY 100 each 1  . TRUEplus Lancets 33G MISC TEST ONE TIME DAILY AS DIRECTED 100 each 1   No current facility-administered medications on file prior to visit.    She is allergic to fluoxetine hcl; lamotrigine; statins; sulfa antibiotics; and sulfa drugs cross reactors.Marland Kitchen  Review of Systems Review of Systems  Constitutional: Negative for activity change, appetite change and fatigue.  HENT: Negative for hearing loss, congestion, tinnitus and ear discharge.  dentist q3m Eyes: Negative for visual disturbance (see optho q1y -- vision corrected to 20/20 with glasses).  Respiratory: Negative for cough, chest tightness and shortness of breath.   Cardiovascular: Negative for chest pain, palpitations and leg swelling.  Gastrointestinal: Negative for abdominal pain, diarrhea, constipation and abdominal distention.  Genitourinary: Negative for urgency, frequency, decreased urine volume and difficulty urinating.  Musculoskeletal: Negative for back pain, arthralgias and gait problem.  Skin: Negative for color change, pallor and rash.  Neurological: Negative for dizziness, light-headedness, numbness and headaches.  Hematological: Negative for adenopathy. Does not bruise/bleed easily.  Psychiatric/Behavioral: Negative for suicidal ideas, confusion, sleep disturbance, self-injury, dysphoric mood, decreased concentration and agitation.       Objective:    BP 124/68 (BP Location: Right Arm, Patient Position: Sitting, Cuff Size: Normal)   Pulse (!) 105   Temp 97.9 F (36.6 C) (Temporal)   Resp 18   Ht 5' 3.5" (1.613 m)   Wt 153 lb (69.4 kg)   SpO2 95%   BMI 26.68 kg/m  General appearance: alert, cooperative, appears stated age and no distress Head:  Normocephalic, without obvious abnormality, atraumatic Eyes: conjunctivae/corneas clear. PERRL, EOM's intact. Fundi benign. Ears: normal TM's and external ear canals both ears Nose: Nares normal. Septum midline. Mucosa normal. No drainage or sinus tenderness. Throat: lips, mucosa, and tongue normal; teeth and gums normal Neck: no adenopathy, no carotid bruit, no JVD, supple, symmetrical, trachea midline and thyroid not enlarged, symmetric, no tenderness/mass/nodules Back: symmetric, no curvature. ROM normal. No CVA tenderness. Lungs: clear to auscultation bilaterally Breasts: normal appearance, no masses or tenderness Heart: regular rate and rhythm, S1, S2 normal, no murmur, click, rub or gallop Abdomen: soft, non-tender; bowel sounds normal; no masses,  no organomegaly Pelvic: not indicated; status post hysterectomy, negative ROS Extremities: extremities normal, atraumatic, no cyanosis or edema Pulses: 2+ and symmetric Skin: Skin color, texture, turgor normal. No rashes or lesions Lymph nodes: Cervical, supraclavicular, and axillary nodes normal. Neurologic: Alert and oriented X 3, normal strength and tone. Normal symmetric reflexes. Normal coordination and gait    Assessment:    Healthy female exam.      Plan:    ghm utd Check labs  See After Visit Summary for Counseling Recommendations    1. Uncontrolled type 2 diabetes mellitus with hyperglycemia ([place]) hgba1c to be checked , minimize simple carbs. Increase exercise as tolerated. Continue current meds - Lipid panel - Hemoglobin A1c - Comprehensive metabolic panel - Microalbumin / creatinine urine ratio  2. Hyperlipidemia associated with type 2 diabetes mellitus ([place]) Encouraged heart healthy diet, increase exercise, avoid trans fats, consider a krill oil cap daily - Lipid panel - Comprehensive metabolic panel  3. Essential hypertension .Well controlled, no changes to meds. Encouraged heart healthy diet such as the DASH  diet and exercise as tolerated.  - Lipid panel - Hemoglobin A1c - Comprehensive metabolic panel - Microalbumin / creatinine urine ratio  4. Need for influenza vaccination   - Flu Vaccine QUAD High Dose(Fluad)  5. Preventative health care  See above

## 2019-07-14 LAB — MICROALBUMIN / CREATININE URINE RATIO
Creatinine,U: 145.4 mg/dL
Microalb Creat Ratio: 2.9 mg/g (ref 0.0–30.0)
Microalb, Ur: 4.3 mg/dL — ABNORMAL HIGH (ref 0.0–1.9)

## 2019-07-16 ENCOUNTER — Inpatient Hospital Stay (HOSPITAL_BASED_OUTPATIENT_CLINIC_OR_DEPARTMENT_OTHER): Payer: Medicare HMO | Admitting: Hematology & Oncology

## 2019-07-16 ENCOUNTER — Inpatient Hospital Stay: Payer: Medicare HMO | Attending: Hematology & Oncology

## 2019-07-16 ENCOUNTER — Encounter: Payer: Self-pay | Admitting: Hematology & Oncology

## 2019-07-16 ENCOUNTER — Other Ambulatory Visit: Payer: Self-pay

## 2019-07-16 VITALS — BP 115/82 | HR 112 | Temp 97.5°F | Resp 18 | Wt 155.8 lb

## 2019-07-16 DIAGNOSIS — Z170 Estrogen receptor positive status [ER+]: Secondary | ICD-10-CM | POA: Insufficient documentation

## 2019-07-16 DIAGNOSIS — C50312 Malignant neoplasm of lower-inner quadrant of left female breast: Secondary | ICD-10-CM

## 2019-07-16 DIAGNOSIS — Z79899 Other long term (current) drug therapy: Secondary | ICD-10-CM | POA: Insufficient documentation

## 2019-07-16 DIAGNOSIS — E559 Vitamin D deficiency, unspecified: Secondary | ICD-10-CM | POA: Diagnosis not present

## 2019-07-16 DIAGNOSIS — Z79810 Long term (current) use of selective estrogen receptor modulators (SERMs): Secondary | ICD-10-CM | POA: Insufficient documentation

## 2019-07-16 DIAGNOSIS — E119 Type 2 diabetes mellitus without complications: Secondary | ICD-10-CM | POA: Insufficient documentation

## 2019-07-16 LAB — CBC WITH DIFFERENTIAL (CANCER CENTER ONLY)
Abs Immature Granulocytes: 0.05 10*3/uL (ref 0.00–0.07)
Basophils Absolute: 0.1 10*3/uL (ref 0.0–0.1)
Basophils Relative: 1 %
Eosinophils Absolute: 0.1 10*3/uL (ref 0.0–0.5)
Eosinophils Relative: 1 %
HCT: 43.6 % (ref 36.0–46.0)
Hemoglobin: 14 g/dL (ref 12.0–15.0)
Immature Granulocytes: 0 %
Lymphocytes Relative: 16 %
Lymphs Abs: 1.8 10*3/uL (ref 0.7–4.0)
MCH: 30 pg (ref 26.0–34.0)
MCHC: 32.1 g/dL (ref 30.0–36.0)
MCV: 93.4 fL (ref 80.0–100.0)
Monocytes Absolute: 1.2 10*3/uL — ABNORMAL HIGH (ref 0.1–1.0)
Monocytes Relative: 10 %
Neutro Abs: 8.2 10*3/uL — ABNORMAL HIGH (ref 1.7–7.7)
Neutrophils Relative %: 72 %
Platelet Count: 195 10*3/uL (ref 150–400)
RBC: 4.67 MIL/uL (ref 3.87–5.11)
RDW: 13.1 % (ref 11.5–15.5)
WBC Count: 11.3 10*3/uL — ABNORMAL HIGH (ref 4.0–10.5)
nRBC: 0 % (ref 0.0–0.2)

## 2019-07-16 LAB — CMP (CANCER CENTER ONLY)
ALT: 38 U/L (ref 0–44)
AST: 31 U/L (ref 15–41)
Albumin: 4.5 g/dL (ref 3.5–5.0)
Alkaline Phosphatase: 43 U/L (ref 38–126)
Anion gap: 9 (ref 5–15)
BUN: 10 mg/dL (ref 8–23)
CO2: 28 mmol/L (ref 22–32)
Calcium: 9.6 mg/dL (ref 8.9–10.3)
Chloride: 104 mmol/L (ref 98–111)
Creatinine: 0.79 mg/dL (ref 0.44–1.00)
GFR, Est AFR Am: >60 mL/min (ref >60)
GFR, Estimated: >60 mL/min (ref >60)
Glucose, Bld: 135 mg/dL — ABNORMAL HIGH (ref 70–99)
Potassium: 4.4 mmol/L (ref 3.5–5.1)
Sodium: 141 mmol/L (ref 135–145)
Total Bilirubin: 0.3 mg/dL (ref 0.3–1.2)
Total Protein: 7.3 g/dL (ref 6.5–8.1)

## 2019-07-16 NOTE — Progress Notes (Signed)
Hematology and Oncology Follow Up Visit  Michelle Long [id number] 01-Sep-1946 73 y.o. 07/16/2019   Principle Diagnosis:  Stage IIA (T2 N0M0) infiltrating ductal carcinoma of the left breast  Current Therapy:   Tamoxifen 20 mg by mouth daily - finished in September 2017    Interim History:  Michelle Long is here for follow-up. We see her every 6 months.  She is doing quite well.  She is managing the coronavirus okay.  She is being very cautious.  She really is staying home quite a bit.  She has had no problems with fever.  There is no cough.  She has had no nausea or vomiting.  She does have diabetes.  She is followed quite closely by her family doctor for this.  She had her mammogram back on 06/25/2019.  The mammogram did not show anything that was suspicious for microcalcifications.  She has had no headache.  There is been no leg swelling.  Overall, her performance status is ECOG 1.   Medications:  Allergies as of 07/16/2019      Reactions   Fluoxetine Hcl Other (See Comments)   seizure   Lamotrigine Other (See Comments)   itch itch   Statins Other (See Comments)   Muscle pains Muscle pains Muscle pains   Sulfa Antibiotics Other (See Comments)   Dyspnea   Sulfa Drugs Cross Reactors    Dyspnea      Medication List       Accurate as of July 16, 2019  3:36 PM. If you have any questions, ask your nurse or doctor.        albuterol 108 (90 Base) MCG/ACT inhaler Commonly known as: Ventolin HFA INHALE TWO PUFFS INTO LUNGS EVERY 6 HOURS AS NEEDED FOR WHEEZING OR SHORTNESS OF BREATH   alendronate 70 MG tablet Commonly known as: FOSAMAX TAKE 1 TABLET BY MOUTH ONCE A WEEK WITH  A  FULL  GLASS  OF  WATER  ON  AN  EMPTY  STOMACH   aspirin 81 MG tablet Take 81 mg by mouth daily.   b complex vitamins tablet Take 1 tablet by mouth daily.   budesonide-formoterol 160-4.5 MCG/ACT inhaler Commonly known as: Symbicort Inhale 2 puffs into the lungs 2 (two) times daily.    CALCIUM 1000 + D PO Take by mouth.   citalopram 10 MG tablet Commonly known as: CELEXA Take 0.5 tablets (5 mg total) by mouth at bedtime.   ezetimibe 10 MG tablet Commonly known as: ZETIA Take 1 tablet (10 mg total) by mouth daily.   LORazepam 0.5 MG tablet Commonly known as: ATIVAN Take 1 tablet (0.5 mg total) by mouth 2 (two) times daily as needed.   LORazepam 1 MG tablet Commonly known as: ATIVAN   losartan 50 MG tablet Commonly known as: COZAAR TAKE 1 TABLET EVERY DAY   metFORMIN 1000 MG tablet Commonly known as: GLUCOPHAGE TAKE 1 TABLET TWO TIMES DAILY WITH A MEAL.   multivitamin tablet Take 1 tablet by mouth daily.   OLANZapine 5 MG tablet Commonly known as: ZYPREXA Take 5 mg by mouth daily.   pantoprazole 40 MG tablet Commonly known as: PROTONIX TAKE 1 TABLET EVERY DAY   rosuvastatin 5 MG tablet Commonly known as: CRESTOR TAKE 1 TABLET BY MOUTH THREE TIMES A WEEK   sitaGLIPtin 100 MG tablet Commonly known as: Januvia Take 1 tablet (100 mg total) by mouth daily.   Spacer/Aero Chamber [employer] To use with inhaler   True Metrix Air Glucose  Meter Devi   True Metrix Blood Glucose Test test strip Generic drug: glucose blood CHECK BLOOD SUGAR ONE TIME DAILY   TRUEplus Lancets 33G Misc TEST ONE TIME DAILY AS DIRECTED   Vitamin D 50 MCG (2000 UT) Caps Take 5,000 Units by mouth daily.       Allergies:  Allergies  Allergen Reactions  . Fluoxetine Hcl Other (See Comments)    seizure  . Lamotrigine Other (See Comments)    itch itch  . Statins Other (See Comments)    Muscle pains Muscle pains Muscle pains  . Sulfa Antibiotics Other (See Comments)    Dyspnea  . Sulfa Drugs Cross Reactors     Dyspnea     Past Medical History, Surgical history, Social history, and Family History were reviewed and updated.  Review of Systems: Review of Systems  Constitutional: Negative.   HENT: Negative.   Eyes: Negative.   Respiratory: Negative.    Cardiovascular: Negative.   Gastrointestinal: Negative.   Genitourinary: Negative.   Musculoskeletal: Negative.   Skin: Negative.   Neurological: Negative.   Endo/Heme/Allergies: Negative.   Psychiatric/Behavioral: Negative.      Physical Exam:  weight is 155 lb 12 oz (70.6 kg). Her temporal temperature is 97.5 F (36.4 C) (abnormal). Her blood pressure is 115/82 and her pulse is 112 (abnormal). Her respiration is 18 and oxygen saturation is 95%.   Wt Readings from Last 3 Encounters:  07/16/19 155 lb 12 oz (70.6 kg)  07/13/19 153 lb (69.4 kg)  01/08/19 153 lb (69.4 kg)    Physical Exam Vitals signs reviewed.  Constitutional:      Comments: Her breast exam shows her right breast with no masses, edema or erythema. There is no right axillary adenopathy. Left breast is somewhat contracted from surgery and radiation. She has a well-healed lumpectomy scar at the 6:00 position. There is some slight firmness at the lumpectomy site. There is no distinct mass. There is no left axillary adenopathy.  HENT:     Head: Normocephalic and atraumatic.  Eyes:     Pupils: Pupils are equal, round, and reactive to light.  Neck:     Musculoskeletal: Normal range of motion.  Cardiovascular:     Rate and Rhythm: Normal rate and regular rhythm.     Heart sounds: Normal heart sounds.  Pulmonary:     Effort: Pulmonary effort is normal.     Breath sounds: Normal breath sounds.  Abdominal:     General: Bowel sounds are normal.     Palpations: Abdomen is soft.  Musculoskeletal: Normal range of motion.        General: No tenderness or deformity.  Lymphadenopathy:     Cervical: No cervical adenopathy.  Skin:    General: Skin is warm and dry.     Findings: No erythema or rash.  Neurological:     Mental Status: She is alert and oriented to person, place, and time.  Psychiatric:        Behavior: Behavior normal.        Thought Content: Thought content normal.        Judgment: Judgment normal.     athy.   Lab Results  Component Value Date   WBC 11.3 (H) 07/16/2019   HGB 14.0 07/16/2019   HCT 43.6 07/16/2019   MCV 93.4 07/16/2019   PLT 195 07/16/2019   No results found for: FERRITIN, IRON, TIBC, UIBC, IRONPCTSAT Lab Results  Component Value Date   RBC 4.67 07/16/2019  No results found for: KPAFRELGTCHN, LAMBDASER, KAPLAMBRATIO No results found for: IGGSERUM, IGA, IGMSERUM No results found for: Odetta Pink, SPEI   Chemistry      Component Value Date/Time   NA 141 07/16/2019 1425   NA 139 07/02/2017 1445   NA 142 01/01/2017 1059   K 4.4 07/16/2019 1425   K 4.6 07/02/2017 1445   K 4.6 01/01/2017 1059   CL 104 07/16/2019 1425   CL 103 07/02/2017 1445   CL 105 01/20/2015 1240   CO2 28 07/16/2019 1425   CO2 28 07/02/2017 1445   CO2 26 01/01/2017 1059   BUN 10 07/16/2019 1425   BUN 10 07/02/2017 1445   BUN 10.6 01/01/2017 1059   CREATININE 0.79 07/16/2019 1425   CREATININE 0.67 07/02/2017 1445   CREATININE 0.8 01/01/2017 1059      Component Value Date/Time   CALCIUM 9.6 07/16/2019 1425   CALCIUM 10.3 07/02/2017 1445   CALCIUM 10.1 01/01/2017 1059   ALKPHOS 43 07/16/2019 1425   ALKPHOS 47 07/02/2017 1445   ALKPHOS 49 01/01/2017 1059   AST 31 07/16/2019 1425   AST 60 (H) 01/01/2017 1059   ALT 38 07/16/2019 1425   ALT 63 (H) 01/01/2017 1059   BILITOT 0.3 07/16/2019 1425   BILITOT 0.64 01/01/2017 1059     Impression and Plan: Ms. Mafi is a most delightful 73 year old postmenopausal white female. She had stage IIA ductal carcinoma the left breast. She completed tamoxifen in September 2017.  I do not see any evidence of recurrent disease.  We will continue to follow her along every 6 months.  She is triple positive so she is at risk for late recurrence. As such, we will continue follow her along every 6 months.      Volanda Napoleon, MD 9/25/20203:36 PM

## 2019-07-17 ENCOUNTER — Other Ambulatory Visit: Payer: Self-pay | Admitting: Family Medicine

## 2019-07-17 DIAGNOSIS — E1165 Type 2 diabetes mellitus with hyperglycemia: Secondary | ICD-10-CM

## 2019-07-17 DIAGNOSIS — E785 Hyperlipidemia, unspecified: Secondary | ICD-10-CM

## 2019-07-17 DIAGNOSIS — E1169 Type 2 diabetes mellitus with other specified complication: Secondary | ICD-10-CM

## 2019-07-17 LAB — VITAMIN D 25 HYDROXY (VIT D DEFICIENCY, FRACTURES): Vit D, 25-Hydroxy: 69.6 ng/mL (ref 30.0–100.0)

## 2019-07-19 ENCOUNTER — Telehealth: Payer: Self-pay | Admitting: Hematology & Oncology

## 2019-07-19 NOTE — Telephone Encounter (Signed)
lmom to inform patient of appts per 9/25 LOS

## 2019-07-20 ENCOUNTER — Other Ambulatory Visit: Payer: Self-pay | Admitting: Family Medicine

## 2019-07-20 DIAGNOSIS — I10 Essential (primary) hypertension: Secondary | ICD-10-CM

## 2019-07-23 ENCOUNTER — Other Ambulatory Visit: Payer: Self-pay | Admitting: Family Medicine

## 2019-07-23 DIAGNOSIS — K219 Gastro-esophageal reflux disease without esophagitis: Secondary | ICD-10-CM

## 2019-08-04 DIAGNOSIS — F250 Schizoaffective disorder, bipolar type: Secondary | ICD-10-CM | POA: Diagnosis not present

## 2019-08-27 ENCOUNTER — Other Ambulatory Visit: Payer: Self-pay | Admitting: Family Medicine

## 2019-08-31 ENCOUNTER — Other Ambulatory Visit: Payer: Self-pay

## 2019-08-31 ENCOUNTER — Other Ambulatory Visit (INDEPENDENT_AMBULATORY_CARE_PROVIDER_SITE_OTHER): Payer: Medicare HMO

## 2019-08-31 DIAGNOSIS — E785 Hyperlipidemia, unspecified: Secondary | ICD-10-CM

## 2019-08-31 DIAGNOSIS — E1165 Type 2 diabetes mellitus with hyperglycemia: Secondary | ICD-10-CM | POA: Diagnosis not present

## 2019-08-31 DIAGNOSIS — C50312 Malignant neoplasm of lower-inner quadrant of left female breast: Secondary | ICD-10-CM

## 2019-08-31 DIAGNOSIS — E1169 Type 2 diabetes mellitus with other specified complication: Secondary | ICD-10-CM | POA: Diagnosis not present

## 2019-08-31 DIAGNOSIS — E559 Vitamin D deficiency, unspecified: Secondary | ICD-10-CM

## 2019-08-31 LAB — COMPREHENSIVE METABOLIC PANEL
ALT: 48 U/L — ABNORMAL HIGH (ref 0–35)
AST: 44 U/L — ABNORMAL HIGH (ref 0–37)
Albumin: 4.5 g/dL (ref 3.5–5.2)
Alkaline Phosphatase: 39 U/L (ref 39–117)
BUN: 13 mg/dL (ref 6–23)
CO2: 30 mEq/L (ref 19–32)
Calcium: 9.7 mg/dL (ref 8.4–10.5)
Chloride: 101 mEq/L (ref 96–112)
Creatinine, Ser: 0.71 mg/dL (ref 0.40–1.20)
GFR: 80.6 mL/min (ref >60.00)
Glucose, Bld: 149 mg/dL — ABNORMAL HIGH (ref 70–99)
Potassium: 4.5 mEq/L (ref 3.5–5.1)
Sodium: 140 mEq/L (ref 135–145)
Total Bilirubin: 0.5 mg/dL (ref 0.2–1.2)
Total Protein: 6.8 g/dL (ref 6.0–8.3)

## 2019-08-31 LAB — LIPID PANEL
Cholesterol: 159 mg/dL (ref 0–200)
HDL: 50.1 mg/dL (ref >39.00)
LDL Cholesterol: 80 mg/dL (ref 0–99)
NonHDL: 108.73
Total CHOL/HDL Ratio: 3
Triglycerides: 145 mg/dL (ref 0.0–149.0)
VLDL: 29 mg/dL (ref 0.0–40.0)

## 2019-08-31 LAB — HEMOGLOBIN A1C: Hgb A1c MFr Bld: 7 % — ABNORMAL HIGH (ref 4.6–6.5)

## 2019-08-31 NOTE — Addendum Note (Signed)
Addended by: Caffie Pinto on: 08/31/2019 11:57 AM   Modules accepted: Orders

## 2019-09-01 ENCOUNTER — Ambulatory Visit: Payer: Medicare HMO | Admitting: Podiatry

## 2019-09-01 ENCOUNTER — Encounter: Payer: Self-pay | Admitting: Podiatry

## 2019-09-01 DIAGNOSIS — B351 Tinea unguium: Secondary | ICD-10-CM | POA: Diagnosis not present

## 2019-09-01 DIAGNOSIS — Q828 Other specified congenital malformations of skin: Secondary | ICD-10-CM

## 2019-09-01 DIAGNOSIS — M79676 Pain in unspecified toe(s): Secondary | ICD-10-CM

## 2019-09-01 DIAGNOSIS — E119 Type 2 diabetes mellitus without complications: Secondary | ICD-10-CM | POA: Diagnosis not present

## 2019-09-01 NOTE — Progress Notes (Signed)
This patient presents to the office for preventative foot care services and her annual diabetic foot exam.  She says that she has developed long thick painful nails which she is unable to self treat.  She says that she has developed a callus on the forefoot both feet.  She says these calluses are painful walking wearing her shoes.  She presents the office today for evaluation and treatment of both feet.  General Appearance  Alert, conversant and in no acute stress.  Vascular  Dorsalis pedis and posterior tibial  pulses are palpable  bilaterally.  Capillary return is within normal limits  bilaterally. Temperature is within normal limits  bilaterally.  Neurologic  Senn-Weinstein monofilament wire test within normal limits  bilaterally. Muscle power within normal limits bilaterally.  Nails Thick disfigured discolored nails with subungual debris  from hallux to fifth toes bilaterally. No evidence of bacterial infection or drainage bilaterally.  Orthopedic  No limitations of motion  feet .  No crepitus or effusions noted.  No bony pathology or digital deformities noted.  Skin  normotropic skin .  Porokeratosis sub 1,5 metatarsal heads  B/L.  Pinch callus noted  B/L. No signs of infections or ulcers noted.    Onychomycosis  B/L  Callus  B/L.  ROV.  Debride nails.  Debride callus  B/L.  RTC 3 months.  No evidence of vascular or neurologic pathology.    Gardiner Barefoot DPM

## 2019-09-13 NOTE — Progress Notes (Addendum)
Virtual Visit via Video Note  I connected with patient on 09/14/19 at  2:30 PM EST by audio enabled telemedicine application and verified that I am speaking with the correct person using two identifiers.   THIS ENCOUNTER IS A VIRTUAL VISIT DUE TO COVID-19 - PATIENT WAS NOT SEEN IN THE OFFICE. PATIENT HAS CONSENTED TO VIRTUAL VISIT / TELEMEDICINE VISIT   Location of patient: home  Location of provider: office  I discussed the limitations of evaluation and management by telemedicine and the availability of in person appointments. The patient expressed understanding and agreed to proceed.   Subjective:   Michelle Long is a 73 y.o. female who presents for Medicare Annual/Subsequent preventive examination.  Review of Systems:   Home Safety/Smoke Alarms: Feels safe in home. Smoke alarms in place.   Lives in 1 story home w/ basement. Dtr lives with her. Walk in shower.  Female:   Mammo-06/29/19       Dexa scan-   06/17/18     CCS- 01/15/13. Pt states she had done last in 2019  And will bring report.    Objective:    Vitals: Unable to assess. This visit is enabled though telemedicine due to Covid 19.   Advanced Directives 09/14/2019 07/16/2019 10/10/2018 07/10/2018 03/09/2018 12/31/2017 07/02/2017  Does Patient Have a Medical Advance Directive? Yes Yes Yes No Yes Yes Yes  Type of Paramedic of [place];Living will [place];Living will - - [place];Living will [place];Living will [place];Living will  Does patient want to make changes to medical advance directive? No - Patient declined No - Patient declined - - No - Patient declined - -  Copy of [place] in Chart? No - copy requested No - copy requested - - No - copy requested No - copy requested -  Would patient like information on creating a medical advance directive? - - - - - - -    Tobacco Social History   Tobacco  Use  Smoking Status Former Smoker  . Packs/day: 1.50  . Years: 33.00  . Pack years: 49.50  . Types: Cigarettes  . Start date: 12/08/1964  . Quit date: 08/21/1998  . Years since quitting: 21.0  Smokeless Tobacco Never Used  Tobacco Comment   quit 16 years ago     Counseling given: Not Answered Comment: quit 16 years ago   Clinical Intake: Pain : No/denies pain     Past Medical History:  Diagnosis Date  . Asthma   . Bipolar disorder ([place])   . Bladder cancer ([place])   . Breast cancer ([place])   . Cancer of lower-inner quadrant of left female breast ([place]) 12/08/2013  . Depression   . Diabetes mellitus   . GERD (gastroesophageal reflux disease)   . Hepatitis B infection   . History of transfusion of whole blood    Approximately 2009, due to breast cancer/chemo.  Marland Kitchen Hyperlipidemia   . Personal history of chemotherapy   . Personal history of radiation therapy    Past Surgical History:  Procedure Laterality Date  . ABDOMINAL HYSTERECTOMY    . APPENDECTOMY  1961  . bladder cancer  2006  . BREAST LUMPECTOMY Left 2007  . PARTIAL HYSTERECTOMY  1991  . TONSILLECTOMY  1951   Family History  Problem Relation Age of Onset  . Arthritis Mother   . Breast cancer Mother   . Transient ischemic attack Mother   . Hypertension  Mother   . Heart disease Father   . Diabetes Father   . Cancer Father        bladder cancer  . Throat cancer Father    Social History   Socioeconomic History  . Marital status: Single    Spouse name: Not on file  . Number of children: Not on file  . Years of education: Not on file  . Highest education level: Not on file  Occupational History    Comment: retired  Scientific laboratory technician  . Financial resource strain: Not on file  . Food insecurity    Worry: Not on file    Inability: Not on file  . Transportation needs    Medical: Not on file    Non-medical: Not on file  Tobacco Use  . Smoking status: Former Smoker    Packs/day: 1.50    Years: 33.00    Pack  years: 49.50    Types: Cigarettes    Start date: 12/08/1964    Quit date: 08/21/1998    Years since quitting: 21.0  . Smokeless tobacco: Never Used  . Tobacco comment: quit 16 years ago  Substance and Sexual Activity  . Alcohol use: Yes    Alcohol/week: 0.0 standard drinks    Comment: very rarely  . Drug use: No  . Sexual activity: Not Currently    Partners: Male  Lifestyle  . Physical activity    Days per week: Not on file    Minutes per session: Not on file  . Stress: Not on file  Relationships  . Social Herbalist on phone: Not on file    Gets together: Not on file    Attends religious service: Not on file    Active member of club or organization: Not on file    Attends meetings of clubs or organizations: Not on file    Relationship status: Not on file  Other Topics Concern  . Not on file  Social History Narrative   Exercise--- walks with neighbor    Outpatient Encounter Medications as of 09/14/2019  Medication Sig  . albuterol (VENTOLIN HFA) 108 (90 Base) MCG/ACT inhaler INHALE TWO PUFFS INTO LUNGS EVERY 6 HOURS AS NEEDED FOR WHEEZING OR SHORTNESS OF BREATH  . alendronate (FOSAMAX) 70 MG tablet TAKE 1 TABLET BY MOUTH ONCE A WEEK WITH  A  FULL  GLASS  OF  WATER  ON  AN  EMPTY  STOMACH  . aspirin 81 MG tablet Take 81 mg by mouth daily.  Marland Kitchen b complex vitamins tablet Take 1 tablet by mouth daily.  . Blood Glucose Monitoring Suppl (TRUE METRIX AIR GLUCOSE METER) DEVI   . Calcium Carb-Cholecalciferol (CALCIUM 1000 + D PO) Take by mouth.  . Cholecalciferol (VITAMIN D) 2000 units CAPS Take 5,000 Units by mouth daily.   . citalopram (CELEXA) 10 MG tablet Take 0.5 tablets (5 mg total) by mouth at bedtime.  Marland Kitchen ezetimibe (ZETIA) 10 MG tablet Take 1 tablet (10 mg total) by mouth daily.  Marland Kitchen LORazepam (ATIVAN) 0.5 MG tablet Take 1 tablet (0.5 mg total) by mouth 2 (two) times daily as needed.  Marland Kitchen LORazepam (ATIVAN) 1 MG tablet   . losartan (COZAAR) 50 MG tablet TAKE 1 TABLET  EVERY DAY  . metFORMIN (GLUCOPHAGE) 1000 MG tablet TAKE 1 TABLET TWO TIMES DAILY WITH A MEAL.  . Multiple Vitamin (MULTIVITAMIN) tablet Take 1 tablet by mouth daily.  Marland Kitchen OLANZapine (ZYPREXA) 5 MG tablet Take 5 mg by mouth daily.  Marland Kitchen  pantoprazole (PROTONIX) 40 MG tablet TAKE 1 TABLET EVERY DAY  . rosuvastatin (CRESTOR) 5 MG tablet TAKE 1 TABLET BY MOUTH THREE TIMES A WEEK  . Spacer/Aero Chamber Mouthpiece MISC To use with inhaler  . SYMBICORT 160-4.5 MCG/ACT inhaler INHALE 2 PUFFS TWICE DAILY  . TRUE METRIX BLOOD GLUCOSE TEST test strip CHECK BLOOD SUGAR ONE TIME DAILY  . TRUEplus Lancets 33G MISC TEST ONE TIME DAILY AS DIRECTED  . [DISCONTINUED] sitaGLIPtin (JANUVIA) 100 MG tablet Take 1 tablet (100 mg total) by mouth daily.   No facility-administered encounter medications on file as of 09/14/2019.     Activities of Daily Living In your present state of health, do you have any difficulty performing the following activities: 09/14/2019 07/13/2019  Hearing? N N  Vision? N N  Difficulty concentrating or making decisions? N Y  Walking or climbing stairs? N N  Dressing or bathing? N N  Doing errands, shopping? N N  Preparing Food and eating ? N -  Using the Toilet? N -  In the past six months, have you accidently leaked urine? N -  Do you have problems with loss of bowel control? N -  Managing your Medications? N -  Managing your Finances? N -  Housekeeping or managing your Housekeeping? N -  Some recent data might be hidden    Patient Care Team: Carollee Herter, Alferd Apa, DO as PCP - General (Family Medicine) Hale Bogus., MD as Referring Physician (Gastroenterology) Franchot Gallo, MD as Consulting Physician (Urology) Ricard Dillon, MD (Psychiatry) Trula Slade, DPM as Consulting Physician (Podiatry) Aretha Parrot as Consulting Physician (Optometry) Clearence Ped, DDS (Dentistry)   Assessment:   This is a routine wellness examination for [facility]. Physical assessment  deferred to PCP.  Exercise Activities and Dietary recommendations Current Exercise Habits: The patient does not participate in regular exercise at present, Exercise limited by: respiratory conditions(s) Diet (meal preparation, eat out, water intake, caffeinated beverages, dairy products, fruits and vegetables): 24 hr recall Breakfast: steel cut oats w/ blueberries Lunch: BBQ w/ slaw and orange. Dinner:  snack  Goals    . Eat more fruits and vegetables    . Increase physical activity     Begin silver sneakers    . Increase water intake       Fall Risk Fall Risk  09/14/2019 03/09/2018 03/03/2017 12/10/2016 07/03/2016  Falls in the past year? 0 No No No No  Number falls in past yr: 0 - - - -  Injury with Fall? 0 - - - -  Risk for fall due to : - - - - -  Follow up Education provided;Falls prevention discussed - - - -   Depression Screen PHQ 2/9 Scores 09/14/2019 03/09/2018 03/03/2017 12/10/2016  PHQ - 2 Score 0 0 0 0    Cognitive Function  MMSE - Mini Mental State Exam 03/03/2017  Orientation to time 5  Orientation to Place 5  Registration 3  Attention/ Calculation 5  Recall 3  Language- name 2 objects 2  Language- repeat 1  Language- follow 3 step command 3  Language- read & follow direction 1  Write a sentence 1  Copy design 1  Total score 30     6CIT Screen 09/14/2019  What Year? 0 points  What month? 0 points  What time? 0 points  Count back from 20 0 points  Months in reverse 0 points  Repeat phrase 0 points  Total Score 0    Immunization History  Administered Date(s) Administered  . Fluad Quad(high Dose 65+) 07/13/2019  . Influenza Whole 07/01/2012  . Influenza, High Dose Seasonal PF 06/19/2017, 07/09/2018  . Influenza,inj,Quad PF,6+ Mos 08/12/2013, 07/22/2014, 07/03/2016  . Influenza-Unspecified 09/20/2015, 06/29/2017  . Pneumococcal Conjugate-13 01/30/2015  . Pneumococcal Polysaccharide-23 10/23/2003, 10/22/2013  . Td 06/22/2013  . Tdap 09/20/2015  .  Zoster 09/07/2013  . Zoster Recombinat (Shingrix) 07/25/2019     Screening Tests Health Maintenance  Topic Date Due  . OPHTHALMOLOGY EXAM  08/06/2019  . FOOT EXAM  01/08/2020  . HEMOGLOBIN A1C  02/28/2020  . MAMMOGRAM  06/24/2021  . COLONOSCOPY  06/04/2023  . TETANUS/TDAP  09/19/2025  . INFLUENZA VACCINE  Completed  . DEXA SCAN  Completed  . Hepatitis C Screening  Completed  . PNA vac Low Risk Adult  Completed       Plan:   See you next year!  Continue to eat heart healthy diet (full of fruits, vegetables, whole grains, lean protein, water--limit salt, fat, and sugar intake) and increase physical activity as tolerated.  Continue doing brain stimulating activities (puzzles, reading, adult coloring books, staying active) to keep memory sharp.   Bring a copy of your living will and/or healthcare power of attorney to your next office visit.   I have personally reviewed and noted the following in the patient's chart:   . Medical and social history . Use of alcohol, tobacco or illicit drugs  . Current medications and supplements . Functional ability and status . Nutritional status . Physical activity . Advanced directives . List of other physicians . Hospitalizations, surgeries, and ER visits in previous 12 months . Vitals . Screenings to include cognitive, depression, and falls . Referrals and appointments  In addition, I have reviewed and discussed with patient certain preventive protocols, quality metrics, and best practice recommendations. A written personalized care plan for preventive services as well as general preventive health recommendations were provided to patient.     Shela Nevin, RN  09/14/2019  Reviewed Ann Held, DO

## 2019-09-14 ENCOUNTER — Encounter: Payer: Self-pay | Admitting: *Deleted

## 2019-09-14 ENCOUNTER — Other Ambulatory Visit: Payer: Self-pay

## 2019-09-14 ENCOUNTER — Ambulatory Visit (INDEPENDENT_AMBULATORY_CARE_PROVIDER_SITE_OTHER): Payer: Medicare HMO | Admitting: *Deleted

## 2019-09-14 DIAGNOSIS — Z0000 Encounter for general adult medical examination without abnormal findings: Secondary | ICD-10-CM

## 2019-09-14 NOTE — Patient Instructions (Signed)
See you next year!  Continue to eat heart healthy diet (full of fruits, vegetables, whole grains, lean protein, water--limit salt, fat, and sugar intake) and increase physical activity as tolerated.  Continue doing brain stimulating activities (puzzles, reading, adult coloring books, staying active) to keep memory sharp.   Bring a copy of your living will and/or healthcare power of attorney to your next office visit.   Michelle Long , Thank you for taking time to come for your Medicare Wellness Visit. I appreciate your ongoing commitment to your health goals. Please review the following plan we discussed and let me know if I can assist you in the future.   These are the goals we discussed: Goals    . Eat more fruits and vegetables    . Increase physical activity     Begin silver sneakers    . Increase water intake       This is a list of the screening recommended for you and due dates:  Health Maintenance  Topic Date Due  . Eye exam for diabetics  08/06/2019  . Complete foot exam   01/08/2020  . Hemoglobin A1C  02/28/2020  . Mammogram  06/24/2021  . Colon Cancer Screening  06/04/2023  . Tetanus Vaccine  09/19/2025  . Flu Shot  Completed  . DEXA scan (bone density measurement)  Completed  .  Hepatitis C: One time screening is recommended by Center for Disease Control  (CDC) for  adults born from [birth year] through 1965.   Completed  . Pneumonia vaccines  Completed    Preventive Care 66 Years and Older, Female Preventive care refers to lifestyle choices and visits with your health care provider that can promote health and wellness. This includes:  A yearly physical exam. This is also called an annual well check.  Regular dental and eye exams.  Immunizations.  Screening for certain conditions.  Healthy lifestyle choices, such as diet and exercise. What can I expect for my preventive care visit? Physical exam Your health care provider will check:  Height and weight. These may  be used to calculate body mass index (BMI), which is a measurement that tells if you are at a healthy weight.  Heart rate and blood pressure.  Your skin for abnormal spots. Counseling Your health care provider may ask you questions about:  Alcohol, tobacco, and drug use.  Emotional well-being.  Home and relationship well-being.  Sexual activity.  Eating habits.  History of falls.  Memory and ability to understand (cognition).  Work and work Statistician.  Pregnancy and menstrual history. What immunizations do I need?  Influenza (flu) vaccine  This is recommended every year. Tetanus, diphtheria, and pertussis (Tdap) vaccine  You may need a Td booster every 10 years. Varicella (chickenpox) vaccine  You may need this vaccine if you have not already been vaccinated. Zoster (shingles) vaccine  You may need this after age 73. Pneumococcal conjugate (PCV13) vaccine  One dose is recommended after age 34. Pneumococcal polysaccharide (PPSV23) vaccine  One dose is recommended after age 52. Measles, mumps, and rubella (MMR) vaccine  You may need at least one dose of MMR if you were born in 1957 or later. You may also need a second dose. Meningococcal conjugate (MenACWY) vaccine  You may need this if you have certain conditions. Hepatitis A vaccine  You may need this if you have certain conditions or if you travel or work in places where you may be exposed to hepatitis A. Hepatitis B vaccine  You may need this if you have certain conditions or if you travel or work in places where you may be exposed to hepatitis B. Haemophilus influenzae type b (Hib) vaccine  You may need this if you have certain conditions. You may receive vaccines as individual doses or as more than one vaccine together in one shot (combination vaccines). Talk with your health care provider about the risks and benefits of combination vaccines. What tests do I need? Blood tests  Lipid and  cholesterol levels. These may be checked every 5 years, or more frequently depending on your overall health.  Hepatitis C test.  Hepatitis B test. Screening  Lung cancer screening. You may have this screening every year starting at age 82 if you have a 30-pack-year history of smoking and currently smoke or have quit within the past 15 years.  Colorectal cancer screening. All adults should have this screening starting at age 69 and continuing until age 64. Your health care provider may recommend screening at age 50 if you are at increased risk. You will have tests every 1-10 years, depending on your results and the type of screening test.  Diabetes screening. This is done by checking your blood sugar (glucose) after you have not eaten for a while (fasting). You may have this done every 1-3 years.  Mammogram. This may be done every 1-2 years. Talk with your health care provider about how often you should have regular mammograms.  BRCA-related cancer screening. This may be done if you have a family history of breast, ovarian, tubal, or peritoneal cancers. Other tests  Sexually transmitted disease (STD) testing.  Bone density scan. This is done to screen for osteoporosis. You may have this done starting at age 82. Follow these instructions at home: Eating and drinking  Eat a diet that includes fresh fruits and vegetables, whole grains, lean protein, and low-fat dairy products. Limit your intake of foods with high amounts of sugar, saturated fats, and salt.  Take vitamin and mineral supplements as recommended by your health care provider.  Do not drink alcohol if your health care provider tells you not to drink.  If you drink alcohol: ? Limit how much you have to 0-1 drink a day. ? Be aware of how much alcohol is in your drink. In the [place] one drink equals one 12 oz bottle of beer (355 mL), one 5 oz glass of wine (148 mL), or one 1 oz glass of hard liquor (44 mL). Lifestyle  Take  daily care of your teeth and gums.  Stay active. Exercise for at least 30 minutes on 5 or more days each week.  Do not use any products that contain nicotine or tobacco, such as cigarettes, e-cigarettes, and chewing tobacco. If you need help quitting, ask your health care provider.  If you are sexually active, practice safe sex. Use a condom or other form of protection in order to prevent STIs (sexually transmitted infections).  Talk with your health care provider about taking a low-dose aspirin or statin. What's next?  Go to your health care provider once a year for a well check visit.  Ask your health care provider how often you should have your eyes and teeth checked.  Stay up to date on all vaccines. This information is not intended to replace advice given to you by your health care provider. Make sure you discuss any questions you have with your health care provider. Document Released: 11/03/2015 Document Revised: 10/01/2018 Document Reviewed: 10/01/2018 Elsevier Patient  Education  2020 [employer]  

## 2019-09-15 DIAGNOSIS — H2513 Age-related nuclear cataract, bilateral: Secondary | ICD-10-CM | POA: Diagnosis not present

## 2019-09-15 DIAGNOSIS — H5210 Myopia, unspecified eye: Secondary | ICD-10-CM | POA: Diagnosis not present

## 2019-09-15 DIAGNOSIS — E119 Type 2 diabetes mellitus without complications: Secondary | ICD-10-CM | POA: Diagnosis not present

## 2019-09-15 DIAGNOSIS — E7800 Pure hypercholesterolemia, unspecified: Secondary | ICD-10-CM | POA: Diagnosis not present

## 2019-09-15 DIAGNOSIS — H35032 Hypertensive retinopathy, left eye: Secondary | ICD-10-CM | POA: Diagnosis not present

## 2019-09-15 LAB — HM DIABETES EYE EXAM

## 2019-09-20 DIAGNOSIS — C678 Malignant neoplasm of overlapping sites of bladder: Secondary | ICD-10-CM | POA: Diagnosis not present

## 2019-09-22 ENCOUNTER — Other Ambulatory Visit: Payer: Self-pay | Admitting: Family Medicine

## 2019-09-28 ENCOUNTER — Other Ambulatory Visit: Payer: Self-pay | Admitting: Family Medicine

## 2019-09-28 ENCOUNTER — Telehealth: Payer: Self-pay | Admitting: *Deleted

## 2019-09-28 NOTE — Telephone Encounter (Signed)
Letter mailed

## 2019-09-28 NOTE — Telephone Encounter (Signed)
Copied from [place] [id number]. Topic: General - Other >> Sep 28, 2019 12:29 PM Celene Kras wrote: Reason for CRM: Pt called and is needing to have a list of her medications mailed to her so she can make an appt for cataracts. Please advise.

## 2019-10-01 ENCOUNTER — Other Ambulatory Visit: Payer: Self-pay | Admitting: Family Medicine

## 2019-10-01 NOTE — Addendum Note (Signed)
Addended by: Roma Schanz R on: 10/01/2019 03:18 PM   Modules accepted: Level of Service

## 2019-10-07 DIAGNOSIS — H18413 Arcus senilis, bilateral: Secondary | ICD-10-CM | POA: Diagnosis not present

## 2019-10-07 DIAGNOSIS — H2513 Age-related nuclear cataract, bilateral: Secondary | ICD-10-CM | POA: Diagnosis not present

## 2019-10-07 DIAGNOSIS — H25013 Cortical age-related cataract, bilateral: Secondary | ICD-10-CM | POA: Diagnosis not present

## 2019-10-07 DIAGNOSIS — H40013 Open angle with borderline findings, low risk, bilateral: Secondary | ICD-10-CM | POA: Diagnosis not present

## 2019-10-07 DIAGNOSIS — H25043 Posterior subcapsular polar age-related cataract, bilateral: Secondary | ICD-10-CM | POA: Diagnosis not present

## 2019-10-07 DIAGNOSIS — H2512 Age-related nuclear cataract, left eye: Secondary | ICD-10-CM | POA: Diagnosis not present

## 2019-10-08 ENCOUNTER — Other Ambulatory Visit: Payer: Self-pay | Admitting: Family Medicine

## 2019-10-08 NOTE — Telephone Encounter (Signed)
Last OV /22/20 Last refill 03/25/19 #90/1 Next OV 01/10/20

## 2019-10-11 ENCOUNTER — Telehealth: Payer: Self-pay | Admitting: *Deleted

## 2019-10-11 ENCOUNTER — Ambulatory Visit: Payer: Medicare HMO | Admitting: Podiatry

## 2019-10-11 ENCOUNTER — Other Ambulatory Visit: Payer: Self-pay

## 2019-10-11 DIAGNOSIS — L84 Corns and callosities: Secondary | ICD-10-CM | POA: Diagnosis not present

## 2019-10-11 DIAGNOSIS — E119 Type 2 diabetes mellitus without complications: Secondary | ICD-10-CM

## 2019-10-11 DIAGNOSIS — M779 Enthesopathy, unspecified: Secondary | ICD-10-CM | POA: Diagnosis not present

## 2019-10-11 NOTE — Telephone Encounter (Signed)
Pt called states she has a sore between her toes that will not heal and has been using an antibiotic cream.

## 2019-10-11 NOTE — Telephone Encounter (Signed)
Pt called as I was dialing her phone number and I told her if she has been dealing with this for a month she needed to be seen in office. Pt agreed and I transferred to scheduler.

## 2019-10-12 ENCOUNTER — Encounter: Payer: Self-pay | Admitting: Podiatry

## 2019-10-12 NOTE — Progress Notes (Signed)
Subjective:  Patient ID: Michelle Long, female    DOB: September 26, 1946,  MRN: [id number]  Chief Complaint  Patient presents with  . Foot Ulcer    pt is here for a f/u on an ulcer of the left foot, pt states that her left foot is not healing properly, pt also shows no signs of infection as well    73 y.o. female presents with the above complaint. Patient presents with complaints of left fourth and fifth digit interdigital corn.  Patient that this has been causing her a lot of pain.  It is not healing.  She states that it there is no clinical signs of infection but she has been applying triple antibiotic cream but has not helped at all.  She states that she does not know what else to do.  She has tried putting padding in between the toes which has helped a little bit but not so much.  She states that she would like a new padding to help with this.  She has been wearing regular sneakers.  She states that there is a lot of her toes when wearing sneakers and that is when it hurts the most when she is ambulating with the sneakers.  I instructed her to change her sneakers with a more wide toe box.  She states that she will do that   Review of Systems: Negative except as noted in the HPI. Denies N/V/F/Ch.  Past Medical History:  Diagnosis Date  . Asthma   . Bipolar disorder ([place])   . Bladder cancer ([place])   . Breast cancer ([place])   . Cancer of lower-inner quadrant of left female breast ([place]) 12/08/2013  . Depression   . Diabetes mellitus   . GERD (gastroesophageal reflux disease)   . Hepatitis B infection   . History of transfusion of whole blood    Approximately 2009, due to breast cancer/chemo.  Marland Kitchen Hyperlipidemia   . Personal history of chemotherapy   . Personal history of radiation therapy     Current Outpatient Medications:  .  albuterol (VENTOLIN HFA) 108 (90 Base) MCG/ACT inhaler, INHALE TWO PUFFS INTO LUNGS EVERY 6 HOURS AS NEEDED FOR WHEEZING OR SHORTNESS OF BREATH, Disp: 54 each, Rfl: 1 .   alendronate (FOSAMAX) 70 MG tablet, TAKE 1 TABLET BY MOUTH ONCE A WEEK WITH  A  FULL  GLASS  OF  WATER  ON  AN  EMPTY  STOMACH, Disp: 12 tablet, Rfl: 1 .  aspirin 81 MG tablet, Take 81 mg by mouth daily., Disp: , Rfl:  .  b complex vitamins tablet, Take 1 tablet by mouth daily., Disp: , Rfl:  .  Blood Glucose Monitoring Suppl (TRUE METRIX AIR GLUCOSE METER) DEVI, , Disp: , Rfl:  .  Calcium Carb-Cholecalciferol (CALCIUM 1000 + D PO), Take by mouth., Disp: , Rfl:  .  Cholecalciferol (VITAMIN D) 2000 units CAPS, Take 5,000 Units by mouth daily. , Disp: , Rfl:  .  citalopram (CELEXA) 10 MG tablet, Take 0.5 tablets (5 mg total) by mouth at bedtime., Disp: 45 tablet, Rfl: 1 .  ezetimibe (ZETIA) 10 MG tablet, TAKE 1 TABLET (10 MG TOTAL) BY MOUTH DAILY., Disp: 90 tablet, Rfl: 1 .  LORazepam (ATIVAN) 0.5 MG tablet, Take 1 tablet (0.5 mg total) by mouth 2 (two) times daily as needed., Disp: 90 tablet, Rfl: 0 .  LORazepam (ATIVAN) 1 MG tablet, , Disp: , Rfl:  .  losartan (COZAAR) 50 MG tablet, TAKE 1 TABLET EVERY DAY,  Disp: 90 tablet, Rfl: 1 .  metFORMIN (GLUCOPHAGE) 1000 MG tablet, TAKE 1 TABLET TWO TIMES DAILY WITH A MEAL., Disp: 180 tablet, Rfl: 1 .  Multiple Vitamin (MULTIVITAMIN) tablet, Take 1 tablet by mouth daily., Disp: , Rfl:  .  OLANZapine (ZYPREXA) 5 MG tablet, Take 5 mg by mouth daily., Disp: , Rfl:  .  pantoprazole (PROTONIX) 40 MG tablet, TAKE 1 TABLET EVERY DAY, Disp: 90 tablet, Rfl: 1 .  rosuvastatin (CRESTOR) 5 MG tablet, TAKE 1 TABLET BY MOUTH THREE TIMES A WEEK, Disp: 36 tablet, Rfl: 0 .  SHINGRIX injection, , Disp: , Rfl:  .  Spacer/Aero Chamber Mouthpiece MISC, To use with inhaler, Disp: 1 each, Rfl: 1 .  SYMBICORT 160-4.5 MCG/ACT inhaler, INHALE 2 PUFFS TWICE DAILY, Disp: 1 Inhaler, Rfl: 3 .  TRUE METRIX BLOOD GLUCOSE TEST test strip, CHECK BLOOD SUGAR ONE TIME DAILY, Disp: 100 strip, Rfl: 0 .  TRUEplus Lancets 33G MISC, TEST ONE TIME DAILY AS DIRECTED, Disp: 100 each, Rfl: 1   Social History   Tobacco Use  Smoking Status Former Smoker  . Packs/day: 1.50  . Years: 33.00  . Pack years: 49.50  . Types: Cigarettes  . Start date: 12/08/1964  . Quit date: 08/21/1998  . Years since quitting: 21.1  Smokeless Tobacco Never Used  Tobacco Comment   quit 16 years ago    Allergies  Allergen Reactions  . Fluoxetine Hcl Other (See Comments)    seizure  . Lamotrigine Other (See Comments)    itch itch  . Statins Other (See Comments)    Muscle pains Muscle pains Muscle pains  . Sulfa Antibiotics Other (See Comments)    Dyspnea  . Sulfa Drugs Cross Reactors     Dyspnea    Objective:  There were no vitals filed for this visit. There is no height or weight on file to calculate BMI. Constitutional Well developed. Well nourished.  Vascular Dorsalis pedis pulses palpable bilaterally. Posterior tibial pulses palpable bilaterally. Capillary refill normal to all digits.  No cyanosis or clubbing noted. Pedal hair growth normal.  Neurologic Normal speech. Oriented to person, place, and time. Epicritic sensation to light touch grossly present bilaterally.  Dermatologic  hyperkeratotic lesion noted in the interspace digital space of fourth and fifth digit.  There is also some capsulitis noted of the fifth digit at the PIPJ especially with range of motion of the fifth digit.  Orthopedic: Normal joint ROM without pain or crepitus bilaterally. No visible deformities. No bony tenderness.   Radiographs: None Assessment:   1. Controlled type 2 diabetes mellitus without complication, unspecified whether long term insulin use ([place])   2. Heloma molle   3. Capsulitis    Plan:  Patient was evaluated and treated and all questions answered.  Left fourth digit/fifth digit heloma molle with associated capsulitis -Educated the patient on the etiology of heloma molle with a possible inflammatory capsulitis associated with that of the fifth digit PIPJ joint.  I believe patient  will benefit from a steroid injection to decrease inflammation as well as aggressive debridement of the hyperkeratotic lesion.  Patient agrees with the plan and would like to proceed with injection to help decrease the pain/inflammation -A steroid injection was performed at left fifth digit using 1% plain Lidocaine and 10 mg of Kenalog. This was well tolerated. -Using chisel blade and a handle of the hyperkeratotic lesion was aggressively debrided down to healthy striated tissue.  Upon completion of the debridement patient felt immediate relief.  No follow-ups on file.

## 2019-10-19 ENCOUNTER — Telehealth: Payer: Self-pay

## 2019-10-19 NOTE — Telephone Encounter (Signed)
A1C, CMP, Vit D, Lipid? Please advise

## 2019-10-19 NOTE — Telephone Encounter (Signed)
Spoke with patient. Confirmed appointment in March

## 2019-10-19 NOTE — Telephone Encounter (Signed)
They are not due until May and it should be with ov -- unless she wants them before her ov

## 2019-10-19 NOTE — Telephone Encounter (Signed)
Copied from [place] [id number]. Topic: General - Other >> Oct 18, 2019  2:32 PM Celene Kras wrote: Reason for CRM: Pt called and is requesting to have her 6 month blood work orders placed. Please advise.

## 2019-10-21 ENCOUNTER — Other Ambulatory Visit: Payer: Self-pay | Admitting: *Deleted

## 2019-10-21 MED ORDER — ALENDRONATE SODIUM 70 MG PO TABS: ORAL_TABLET | ORAL | 1 refills | Status: DC

## 2019-10-21 NOTE — Telephone Encounter (Signed)
Refills sent

## 2019-10-25 ENCOUNTER — Other Ambulatory Visit: Payer: Self-pay | Admitting: Family Medicine

## 2019-10-26 MED ORDER — ROSUVASTATIN CALCIUM 5 MG PO TABS: 5.0000 mg | ORAL_TABLET | ORAL | 5 refills | Status: DC

## 2019-11-03 DIAGNOSIS — H2512 Age-related nuclear cataract, left eye: Secondary | ICD-10-CM | POA: Diagnosis not present

## 2019-11-04 DIAGNOSIS — H2511 Age-related nuclear cataract, right eye: Secondary | ICD-10-CM | POA: Diagnosis not present

## 2019-11-11 DIAGNOSIS — H2512 Age-related nuclear cataract, left eye: Secondary | ICD-10-CM | POA: Diagnosis not present

## 2019-11-14 DIAGNOSIS — Z01818 Encounter for other preprocedural examination: Secondary | ICD-10-CM | POA: Diagnosis not present

## 2019-11-17 DIAGNOSIS — H2511 Age-related nuclear cataract, right eye: Secondary | ICD-10-CM | POA: Diagnosis not present

## 2019-11-17 DIAGNOSIS — E119 Type 2 diabetes mellitus without complications: Secondary | ICD-10-CM | POA: Diagnosis not present

## 2019-11-17 DIAGNOSIS — E1136 Type 2 diabetes mellitus with diabetic cataract: Secondary | ICD-10-CM | POA: Diagnosis not present

## 2019-11-17 DIAGNOSIS — Z8551 Personal history of malignant neoplasm of bladder: Secondary | ICD-10-CM | POA: Diagnosis not present

## 2019-11-17 DIAGNOSIS — F319 Bipolar disorder, unspecified: Secondary | ICD-10-CM | POA: Diagnosis not present

## 2019-11-17 DIAGNOSIS — K219 Gastro-esophageal reflux disease without esophagitis: Secondary | ICD-10-CM | POA: Diagnosis not present

## 2019-11-17 DIAGNOSIS — Z853 Personal history of malignant neoplasm of breast: Secondary | ICD-10-CM | POA: Diagnosis not present

## 2019-11-17 DIAGNOSIS — I10 Essential (primary) hypertension: Secondary | ICD-10-CM | POA: Diagnosis not present

## 2019-11-17 DIAGNOSIS — J45909 Unspecified asthma, uncomplicated: Secondary | ICD-10-CM | POA: Diagnosis not present

## 2019-11-17 DIAGNOSIS — E785 Hyperlipidemia, unspecified: Secondary | ICD-10-CM | POA: Diagnosis not present

## 2019-11-24 DIAGNOSIS — H2511 Age-related nuclear cataract, right eye: Secondary | ICD-10-CM | POA: Diagnosis not present

## 2019-11-26 ENCOUNTER — Other Ambulatory Visit: Payer: Self-pay | Admitting: Family Medicine

## 2019-11-26 DIAGNOSIS — I10 Essential (primary) hypertension: Secondary | ICD-10-CM

## 2019-11-29 ENCOUNTER — Other Ambulatory Visit: Payer: Self-pay | Admitting: Family Medicine

## 2019-11-29 DIAGNOSIS — K219 Gastro-esophageal reflux disease without esophagitis: Secondary | ICD-10-CM

## 2019-12-01 ENCOUNTER — Ambulatory Visit: Payer: Medicare HMO | Admitting: Podiatry

## 2019-12-03 ENCOUNTER — Ambulatory Visit: Payer: Medicare HMO | Admitting: Podiatry

## 2019-12-03 ENCOUNTER — Other Ambulatory Visit: Payer: Self-pay

## 2019-12-03 DIAGNOSIS — L84 Corns and callosities: Secondary | ICD-10-CM | POA: Diagnosis not present

## 2019-12-03 DIAGNOSIS — L989 Disorder of the skin and subcutaneous tissue, unspecified: Secondary | ICD-10-CM

## 2019-12-03 DIAGNOSIS — E119 Type 2 diabetes mellitus without complications: Secondary | ICD-10-CM

## 2019-12-06 ENCOUNTER — Encounter: Payer: Self-pay | Admitting: Podiatry

## 2019-12-06 NOTE — Progress Notes (Signed)
Subjective:  Patient ID: Michelle Long, female    DOB: 20-Mar-1946,  MRN: [id number]  Chief Complaint  Patient presents with  . Wound Check    L foot, between 4th and 5th toes. Pt stated, "I think it's doing well. No pain".    74 y.o. female presents with the above complaint.  Patient presents with bilateral hyperkeratotic lesion submetatarsal 5 as well as submetatarsal 1.  She states that they are very painful in nature when ambulating.  She states that she has tried offloading it.  She denies any other acute complaints.  She would like to know if this could be debrided down.  She states that they are very painful to ambulate on.    Review of Systems: Negative except as noted in the HPI. Denies N/V/F/Ch.  Past Medical History:  Diagnosis Date  . Asthma   . Bipolar disorder ([place])   . Bladder cancer ([place])   . Breast cancer ([place])   . Cancer of lower-inner quadrant of left female breast ([place]) 12/08/2013  . Depression   . Diabetes mellitus   . GERD (gastroesophageal reflux disease)   . Hepatitis B infection   . History of transfusion of whole blood    Approximately 2009, due to breast cancer/chemo.  Marland Kitchen Hyperlipidemia   . Personal history of chemotherapy   . Personal history of radiation therapy     Current Outpatient Medications:  .  albuterol (VENTOLIN HFA) 108 (90 Base) MCG/ACT inhaler, INHALE TWO PUFFS INTO LUNGS EVERY 6 HOURS AS NEEDED FOR WHEEZING OR SHORTNESS OF BREATH, Disp: 54 each, Rfl: 1 .  alendronate (FOSAMAX) 70 MG tablet, TAKE 1 TABLET BY MOUTH ONCE A WEEK WITH  A  FULL  GLASS  OF  WATER  ON  AN  EMPTY  STOMACH, Disp: 12 tablet, Rfl: 1 .  aspirin 81 MG tablet, Take 81 mg by mouth daily., Disp: , Rfl:  .  b complex vitamins tablet, Take 1 tablet by mouth daily., Disp: , Rfl:  .  Blood Glucose Monitoring Suppl (TRUE METRIX AIR GLUCOSE METER) DEVI, , Disp: , Rfl:  .  Calcium Carb-Cholecalciferol (CALCIUM 1000 + D PO), Take by mouth., Disp: , Rfl:  .  Cholecalciferol (VITAMIN  D) 2000 units CAPS, Take 5,000 Units by mouth daily. , Disp: , Rfl:  .  citalopram (CELEXA) 10 MG tablet, Take 0.5 tablets (5 mg total) by mouth at bedtime., Disp: 45 tablet, Rfl: 1 .  ezetimibe (ZETIA) 10 MG tablet, TAKE 1 TABLET (10 MG TOTAL) BY MOUTH DAILY., Disp: 90 tablet, Rfl: 1 .  LORazepam (ATIVAN) 0.5 MG tablet, Take 1 tablet (0.5 mg total) by mouth 2 (two) times daily as needed., Disp: 90 tablet, Rfl: 0 .  LORazepam (ATIVAN) 1 MG tablet, , Disp: , Rfl:  .  losartan (COZAAR) 50 MG tablet, TAKE 1 TABLET EVERY DAY, Disp: 90 tablet, Rfl: 1 .  metFORMIN (GLUCOPHAGE) 1000 MG tablet, TAKE 1 TABLET TWO TIMES DAILY WITH A MEAL., Disp: 180 tablet, Rfl: 1 .  Multiple Vitamin (MULTIVITAMIN) tablet, Take 1 tablet by mouth daily., Disp: , Rfl:  .  OLANZapine (ZYPREXA) 5 MG tablet, Take 5 mg by mouth daily., Disp: , Rfl:  .  pantoprazole (PROTONIX) 40 MG tablet, TAKE 1 TABLET EVERY DAY, Disp: 90 tablet, Rfl: 1 .  rosuvastatin (CRESTOR) 5 MG tablet, Take 1 tablet (5 mg total) by mouth 3 (three) times a week., Disp: 90 tablet, Rfl: 5 .  SHINGRIX injection, , Disp: , Rfl:  .  Spacer/Aero Chamber [employer], To use with inhaler, Disp: 1 each, Rfl: 1 .  SYMBICORT 160-4.5 MCG/ACT inhaler, INHALE 2 PUFFS TWICE DAILY, Disp: 1 Inhaler, Rfl: 3 .  TRUE METRIX BLOOD GLUCOSE TEST test strip, CHECK BLOOD SUGAR ONE TIME DAILY, Disp: 100 strip, Rfl: 0 .  TRUEplus Lancets 33G MISC, TEST ONE TIME DAILY AS DIRECTED, Disp: 100 each, Rfl: 1  Social History   Tobacco Use  Smoking Status Former Smoker  . Packs/day: 1.50  . Years: 33.00  . Pack years: 49.50  . Types: Cigarettes  . Start date: 12/08/1964  . Quit date: 08/21/1998  . Years since quitting: 21.3  Smokeless Tobacco Never Used  Tobacco Comment   quit 16 years ago    Allergies  Allergen Reactions  . Fluoxetine Hcl Other (See Comments)    seizure  . Lamotrigine Other (See Comments)    itch itch  . Statins Other (See Comments)    Muscle  pains Muscle pains Muscle pains  . Sulfa Antibiotics Other (See Comments)    Dyspnea  . Sulfa Drugs Cross Reactors     Dyspnea    Objective:  There were no vitals filed for this visit. There is no height or weight on file to calculate BMI. Constitutional Well developed. Well nourished.  Vascular Dorsalis pedis pulses palpable bilaterally. Posterior tibial pulses palpable bilaterally. Capillary refill normal to all digits.  No cyanosis or clubbing noted. Pedal hair growth normal.  Neurologic Normal speech. Oriented to person, place, and time. Epicritic sensation to light touch grossly present bilaterally.  Dermatologic  hyperkeratotic lesion noted in the interspace digital space of fourth and fifth digit.  There is also some capsulitis noted of the fifth digit at the PIPJ especially with range of motion of the fifth digit.  Orthopedic: Normal joint ROM without pain or crepitus bilaterally. No visible deformities. No bony tenderness.   Radiographs: None Assessment:   1. Controlled type 2 diabetes mellitus without complication, unspecified whether long term insulin use ([place])   2. Benign skin lesion    Plan:  Patient was evaluated and treated and all questions answered.  Bilateral submetatarsal 5 and 1 hyperkeratotic lesion/benign skin lesion -Using a chisel blade and a handle, hyperkeratotic lesions were debrided down aggressively to healthy striated tissue.  No pinpoint bleeding noted.  No complication noted.  Left fourth digit/fifth digit heloma molle with associated capsulitis -Resolved.  She states that she does not have pain anymore.  Return in about 3 months (around 03/01/2020).

## 2019-12-17 DIAGNOSIS — Z0100 Encounter for examination of eyes and vision without abnormal findings: Secondary | ICD-10-CM | POA: Diagnosis not present

## 2020-01-07 ENCOUNTER — Other Ambulatory Visit: Payer: Self-pay

## 2020-01-10 ENCOUNTER — Encounter: Payer: Self-pay | Admitting: Family Medicine

## 2020-01-10 ENCOUNTER — Ambulatory Visit (INDEPENDENT_AMBULATORY_CARE_PROVIDER_SITE_OTHER): Payer: Medicare HMO | Admitting: Family Medicine

## 2020-01-10 ENCOUNTER — Other Ambulatory Visit: Payer: Self-pay

## 2020-01-10 VITALS — BP 136/68 | HR 100 | Temp 96.6°F | Resp 19 | Ht 63.5 in | Wt 156.2 lb

## 2020-01-10 DIAGNOSIS — F3160 Bipolar disorder, current episode mixed, unspecified: Secondary | ICD-10-CM | POA: Diagnosis not present

## 2020-01-10 DIAGNOSIS — J4520 Mild intermittent asthma, uncomplicated: Secondary | ICD-10-CM

## 2020-01-10 DIAGNOSIS — E1169 Type 2 diabetes mellitus with other specified complication: Secondary | ICD-10-CM | POA: Diagnosis not present

## 2020-01-10 DIAGNOSIS — E785 Hyperlipidemia, unspecified: Secondary | ICD-10-CM

## 2020-01-10 DIAGNOSIS — I10 Essential (primary) hypertension: Secondary | ICD-10-CM

## 2020-01-10 DIAGNOSIS — F3181 Bipolar II disorder: Secondary | ICD-10-CM | POA: Diagnosis not present

## 2020-01-10 DIAGNOSIS — C50312 Malignant neoplasm of lower-inner quadrant of left female breast: Secondary | ICD-10-CM

## 2020-01-10 DIAGNOSIS — E1165 Type 2 diabetes mellitus with hyperglycemia: Secondary | ICD-10-CM

## 2020-01-10 LAB — LIPID PANEL
Cholesterol: 153 mg/dL (ref 0–200)
HDL: 50.1 mg/dL (ref >39.00)
LDL Cholesterol: 69 mg/dL (ref 0–99)
NonHDL: 102.5
Total CHOL/HDL Ratio: 3
Triglycerides: 167 mg/dL — ABNORMAL HIGH (ref 0.0–149.0)
VLDL: 33.4 mg/dL (ref 0.0–40.0)

## 2020-01-10 LAB — COMPREHENSIVE METABOLIC PANEL
ALT: 41 U/L — ABNORMAL HIGH (ref 0–35)
AST: 32 U/L (ref 0–37)
Albumin: 4.2 g/dL (ref 3.5–5.2)
Alkaline Phosphatase: 37 U/L — ABNORMAL LOW (ref 39–117)
BUN: 10 mg/dL (ref 6–23)
CO2: 30 mEq/L (ref 19–32)
Calcium: 9.6 mg/dL (ref 8.4–10.5)
Chloride: 100 mEq/L (ref 96–112)
Creatinine, Ser: 0.72 mg/dL (ref 0.40–1.20)
GFR: 79.23 mL/min (ref >60.00)
Glucose, Bld: 116 mg/dL — ABNORMAL HIGH (ref 70–99)
Potassium: 4.4 mEq/L (ref 3.5–5.1)
Sodium: 140 mEq/L (ref 135–145)
Total Bilirubin: 0.5 mg/dL (ref 0.2–1.2)
Total Protein: 6.8 g/dL (ref 6.0–8.3)

## 2020-01-10 LAB — HEMOGLOBIN A1C: Hgb A1c MFr Bld: 6.8 % — ABNORMAL HIGH (ref 4.6–6.5)

## 2020-01-10 LAB — MICROALBUMIN / CREATININE URINE RATIO
Creatinine,U: 141.6 mg/dL
Microalb Creat Ratio: 2.6 mg/g (ref 0.0–30.0)
Microalb, Ur: 3.6 mg/dL — ABNORMAL HIGH (ref 0.0–1.9)

## 2020-01-10 MED ORDER — BUDESONIDE-FORMOTEROL FUMARATE 160-4.5 MCG/ACT IN AERO: 2.0000 | INHALATION_SPRAY | Freq: Two times a day (BID) | RESPIRATORY_TRACT | 3 refills | Status: DC

## 2020-01-10 MED ORDER — TRUE METRIX AIR GLUCOSE METER DEVI: 1.0000 | Freq: Every day | 0 refills | Status: DC

## 2020-01-10 MED ORDER — HAIR SKIN AND NAILS FORMULA PO TABS: 1.0000 | ORAL_TABLET | Freq: Every day | ORAL | Status: DC

## 2020-01-10 NOTE — Patient Instructions (Signed)
Carbohydrate Counting for Diabetes Mellitus, Adult  Carbohydrate counting is a method of keeping track of how many carbohydrates you eat. Eating carbohydrates naturally increases the amount of sugar (glucose) in the blood. Counting how many carbohydrates you eat helps keep your blood glucose within normal limits, which helps you manage your diabetes (diabetes mellitus). It is important to know how many carbohydrates you can safely have in each meal. This is different for every person. A diet and nutrition specialist (registered dietitian) can help you make a meal plan and calculate how many carbohydrates you should have at each meal and snack. Carbohydrates are found in the following foods:  Grains, such as breads and cereals.  Dried beans and soy products.  Starchy vegetables, such as potatoes, peas, and corn.  Fruit and fruit juices.  Milk and yogurt.  Sweets and snack foods, such as cake, cookies, candy, chips, and soft drinks. How do I count carbohydrates? There are two ways to count carbohydrates in food. You can use either of the methods or a combination of both. Reading "Nutrition Facts" on packaged food The "Nutrition Facts" list is included on the labels of almost all packaged foods and beverages in the [place] It includes:  The serving size.  Information about nutrients in each serving, including the grams (g) of carbohydrate per serving. To use the "Nutrition Facts":  Decide how many servings you will have.  Multiply the number of servings by the number of carbohydrates per serving.  The resulting number is the total amount of carbohydrates that you will be having. Learning standard serving sizes of other foods When you eat carbohydrate foods that are not packaged or do not include "Nutrition Facts" on the label, you need to measure the servings in order to count the amount of carbohydrates:  Measure the foods that you will eat with a food scale or measuring cup, if  needed.  Decide how many standard-size servings you will eat.  Multiply the number of servings by 15. Most carbohydrate-rich foods have about 15 g of carbohydrates per serving. ? For example, if you eat 8 oz (170 g) of strawberries, you will have eaten 2 servings and 30 g of carbohydrates (2 servings x 15 g = 30 g).  For foods that have more than one food mixed, such as soups and casseroles, you must count the carbohydrates in each food that is included. The following list contains standard serving sizes of common carbohydrate-rich foods. Each of these servings has about 15 g of carbohydrates:   hamburger bun or  English muffin.   oz (15 mL) syrup.   oz (14 g) jelly.  1 slice of bread.  1 six-inch tortilla.  3 oz (85 g) cooked rice or pasta.  4 oz (113 g) cooked dried beans.  4 oz (113 g) starchy vegetable, such as peas, corn, or potatoes.  4 oz (113 g) hot cereal.  4 oz (113 g) mashed potatoes or  of a large baked potato.  4 oz (113 g) canned or frozen fruit.  4 oz (120 mL) fruit juice.  4-6 crackers.  6 chicken nuggets.  6 oz (170 g) unsweetened dry cereal.  6 oz (170 g) plain fat-free yogurt or yogurt sweetened with artificial sweeteners.  8 oz (240 mL) milk.  8 oz (170 g) fresh fruit or one small piece of fruit.  24 oz (680 g) popped popcorn. Example of carbohydrate counting Sample meal  3 oz (85 g) chicken breast.  6 oz (170 g)   brown rice.  4 oz (113 g) corn.  8 oz (240 mL) milk.  8 oz (170 g) strawberries with sugar-free whipped topping. Carbohydrate calculation 1. Identify the foods that contain carbohydrates: ? Rice. ? Corn. ? Milk. ? Strawberries. 2. Calculate how many servings you have of each food: ? 2 servings rice. ? 1 serving corn. ? 1 serving milk. ? 1 serving strawberries. 3. Multiply each number of servings by 15 g: ? 2 servings rice x 15 g = 30 g. ? 1 serving corn x 15 g = 15 g. ? 1 serving milk x 15 g = 15 g. ? 1  serving strawberries x 15 g = 15 g. 4. Add together all of the amounts to find the total grams of carbohydrates eaten: ? 30 g + 15 g + 15 g + 15 g = 75 g of carbohydrates total. Summary  Carbohydrate counting is a method of keeping track of how many carbohydrates you eat.  Eating carbohydrates naturally increases the amount of sugar (glucose) in the blood.  Counting how many carbohydrates you eat helps keep your blood glucose within normal limits, which helps you manage your diabetes.  A diet and nutrition specialist (registered dietitian) can help you make a meal plan and calculate how many carbohydrates you should have at each meal and snack. This information is not intended to replace advice given to you by your health care provider. Make sure you discuss any questions you have with your health care provider. Document Revised: 05/01/2017 Document Reviewed: 03/20/2016 Elsevier Patient Education  2020 [employer]  

## 2020-01-10 NOTE — Progress Notes (Signed)
Patient ID: Michelle Long, female    DOB: December 17, 1945  Age: 75 y.o. MRN: [id number]    Subjective:  Subjective  HPI Michelle Long presents for f/u bp , chol and dm  No complaints   HYPERTENSION   Blood pressure range-not checking   Chest pain- no      Dyspnea- no Lightheadedness- no   Edema- no  Other side effects - no   Medication compliance: good Low salt diet- yes     DIABETES    Blood Sugar ranges-100-130  Polyuria- no New Visual problems- nono  Hypoglycemic symptoms-no  Other side effects-no Medication compliance - good Last eye exam- 11/2019 Foot exam- today   HYPERLIPIDEMIA  Medication compliance- good RUQ pain- no  Muscle aches- no Other side effects-no         Review of Systems  Constitutional: Negative for activity change, appetite change, chills, fatigue, fever and unexpected weight change.  HENT: Negative for congestion and hearing loss.   Eyes: Negative for discharge.  Respiratory: Negative for cough and shortness of breath.   Cardiovascular: Negative for chest pain, palpitations and leg swelling.  Gastrointestinal: Negative for abdominal pain, blood in stool, constipation, diarrhea, nausea and vomiting.  Genitourinary: Negative for dysuria, frequency, hematuria and urgency.  Musculoskeletal: Negative for back pain and myalgias.  Skin: Negative for rash.  Allergic/Immunologic: Negative for environmental allergies.  Neurological: Negative for dizziness, weakness and headaches.  Hematological: Does not bruise/bleed easily.  Psychiatric/Behavioral: Negative for behavioral problems, dysphoric mood and suicidal ideas. The patient is not nervous/anxious.     History Past Medical History:  Diagnosis Date  . Asthma   . Bipolar disorder ([place])   . Bladder cancer ([place])   . Breast cancer ([place])   . Cancer of lower-inner quadrant of left female breast ([place]) 12/08/2013  . Depression   . Diabetes mellitus   . GERD (gastroesophageal reflux disease)   .  Hepatitis B infection   . History of transfusion of whole blood    Approximately 2009, due to breast cancer/chemo.  Marland Kitchen Hyperlipidemia   . Personal history of chemotherapy   . Personal history of radiation therapy     She has a past surgical history that includes Tonsillectomy (1951); Appendectomy (1961); Partial hysterectomy (1991); bladder cancer (2006); Abdominal hysterectomy; and Breast lumpectomy (Left, 2007).   Her family history includes Arthritis in her mother; Breast cancer in her mother; Cancer in her father; Diabetes in her father; Heart disease in her father; Hypertension in her mother; Throat cancer in her father; Transient ischemic attack in her mother.She reports that she quit smoking about 21 years ago. Her smoking use included cigarettes. She started smoking about 55 years ago. She has a 49.50 pack-year smoking history. She has never used smokeless tobacco. She reports current alcohol use. She reports that she does not use drugs.  Current Outpatient Medications on File Prior to Visit  Medication Sig Dispense Refill  . albuterol (VENTOLIN HFA) 108 (90 Base) MCG/ACT inhaler INHALE TWO PUFFS INTO LUNGS EVERY 6 HOURS AS NEEDED FOR WHEEZING OR SHORTNESS OF BREATH 54 each 1  . alendronate (FOSAMAX) 70 MG tablet TAKE 1 TABLET BY MOUTH ONCE A WEEK WITH  A  FULL  GLASS  OF  WATER  ON  AN  EMPTY  STOMACH 12 tablet 1  . aspirin 81 MG tablet Take 81 mg by mouth daily.    Marland Kitchen b complex vitamins tablet Take 1 tablet by mouth daily.    . Calcium Carb-Cholecalciferol (  CALCIUM 1000 + D PO) Take by mouth.    . Cholecalciferol (VITAMIN D) 2000 units CAPS Take 5,000 Units by mouth daily.     . citalopram (CELEXA) 10 MG tablet Take 0.5 tablets (5 mg total) by mouth at bedtime. 45 tablet 1  . ezetimibe (ZETIA) 10 MG tablet TAKE 1 TABLET (10 MG TOTAL) BY MOUTH DAILY. 90 tablet 1  . LORazepam (ATIVAN) 0.5 MG tablet Take 1 tablet (0.5 mg total) by mouth 2 (two) times daily as needed. 90 tablet 0  .  losartan (COZAAR) 50 MG tablet TAKE 1 TABLET EVERY DAY 90 tablet 1  . metFORMIN (GLUCOPHAGE) 1000 MG tablet TAKE 1 TABLET TWO TIMES DAILY WITH A MEAL. 180 tablet 1  . Multiple Vitamin (MULTIVITAMIN) tablet Take 1 tablet by mouth daily.    Marland Kitchen OLANZapine (ZYPREXA) 5 MG tablet 1/2 tablet daily 30 tablet 0  . pantoprazole (PROTONIX) 40 MG tablet TAKE 1 TABLET EVERY DAY 90 tablet 1  . rosuvastatin (CRESTOR) 5 MG tablet Take 1 tablet (5 mg total) by mouth 3 (three) times a week. 90 tablet 5  . SHINGRIX injection     . Spacer/Aero Chamber Mouthpiece MISC To use with inhaler 1 each 1  . TRUE METRIX BLOOD GLUCOSE TEST test strip CHECK BLOOD SUGAR ONE TIME DAILY 100 strip 0  . TRUEplus Lancets 33G MISC TEST ONE TIME DAILY AS DIRECTED 100 each 1   No current facility-administered medications on file prior to visit.     Objective:  Objective  Physical Exam Vitals and nursing note reviewed.  Constitutional:      Appearance: She is well-developed.  HENT:     Head: Normocephalic and atraumatic.  Eyes:     Conjunctiva/sclera: Conjunctivae normal.  Neck:     Thyroid: No thyromegaly.     Vascular: No carotid bruit or JVD.  Cardiovascular:     Rate and Rhythm: Normal rate and regular rhythm.     Heart sounds: Normal heart sounds. No murmur.  Pulmonary:     Effort: Pulmonary effort is normal. No respiratory distress.     Breath sounds: Normal breath sounds. No wheezing or rales.  Chest:     Chest wall: No tenderness.  Musculoskeletal:     Cervical back: Normal range of motion and neck supple.  Neurological:     Mental Status: She is alert and oriented to person, place, and time.    BP 136/68 (BP Location: Right Arm, Patient Position: Sitting, Cuff Size: Normal)   Pulse 100   Temp (!) 96.6 F (35.9 C) (Temporal)   Resp 19   Ht 5' 3.5" (1.613 m)   Wt 156 lb 4 oz (70.9 kg)   SpO2 95%   BMI 27.24 kg/m  Wt Readings from Last 3 Encounters:  01/10/20 156 lb 4 oz (70.9 kg)  07/16/19 155 lb 12  oz (70.6 kg)  07/13/19 153 lb (69.4 kg)     Lab Results  Component Value Date   WBC 11.3 (H) 07/16/2019   HGB 14.0 07/16/2019   HCT 43.6 07/16/2019   PLT 195 07/16/2019   GLUCOSE 116 (H) 01/10/2020   CHOL 153 01/10/2020   TRIG 167.0 (H) 01/10/2020   HDL 50.10 01/10/2020   LDLDIRECT 145.6 06/22/2013   LDLCALC 69 01/10/2020   ALT 41 (H) 01/10/2020   AST 32 01/10/2020   NA 140 01/10/2020   K 4.4 01/10/2020   CL 100 01/10/2020   CREATININE 0.72 01/10/2020   BUN 10 01/10/2020  CO2 30 01/10/2020   TSH 0.80 01/30/2015   INR 0.97 09/25/2011   HGBA1C 6.8 (H) 01/10/2020   MICROALBUR 3.6 (H) 01/10/2020    MM 3D SCREEN BREAST BILATERAL  Result Date: 06/29/2019 CLINICAL DATA:  Screening. EXAM: DIGITAL SCREENING BILATERAL MAMMOGRAM WITH TOMO AND CAD COMPARISON:  Previous exam(s). ACR Breast Density Category c: The breast tissue is heterogeneously dense, which may obscure small masses. FINDINGS: There are no findings suspicious for malignancy. Images were processed with CAD. IMPRESSION: No mammographic evidence of malignancy. A result letter of this screening mammogram will be mailed directly to the patient. RECOMMENDATION: Screening mammogram in one year. (Code:SM-B-01Y) BI-RADS CATEGORY  1: Negative. Electronically Signed   By: Franki Cabot M.D.   On: 06/29/2019 11:08     Assessment & Plan:  Plan  I have changed Michelle Neat. Mcclafferty "Adrienna Sen"'s Symbicort to budesonide-formoterol. I have also changed her OLANZapine and True Metrix Air Glucose Meter. I am also having her maintain her aspirin, Vitamin D, b complex vitamins, LORazepam, Spacer/Aero Chamber Mouthpiece, multivitamin, albuterol, citalopram, Calcium Carb-Cholecalciferol (CALCIUM 1000 + D PO), True Metrix Blood Glucose Test, TRUEplus Lancets 33G, ezetimibe, Shingrix, alendronate, rosuvastatin, losartan, metFORMIN, and pantoprazole.  Meds ordered this encounter  Medications  . Blood Glucose Monitoring Suppl (TRUE METRIX AIR GLUCOSE  METER) DEVI    Sig: 1 Device by Does not apply route daily.    Dispense:  1 each    Refill:  0  . budesonide-formoterol (SYMBICORT) 160-4.5 MCG/ACT inhaler    Sig: Inhale 2 puffs into the lungs 2 (two) times daily.    Dispense:  3 Inhaler    Refill:  3    Problem List Items Addressed This Visit      Unprioritized   Asthma   Relevant Medications   budesonide-formoterol (SYMBICORT) 160-4.5 MCG/ACT inhaler   Bipolar disorder ([place])    Per psych      Cancer of lower-inner quadrant of left female breast ([place]) (Chronic)    Per oncology      Relevant Medications   Blood Glucose Monitoring Suppl (TRUE METRIX AIR GLUCOSE METER) DEVI   Essential hypertension    Well controlled, no changes to meds. Encouraged heart healthy diet such as the DASH diet and exercise as tolerated.       Relevant Orders   Lipid panel (Completed)   Hemoglobin A1c (Completed)   Comprehensive metabolic panel (Completed)   Microalbumin / creatinine urine ratio (Completed)   Hyperlipidemia associated with type 2 diabetes mellitus (HCC)    Encouraged heart healthy diet, increase exercise, avoid trans fats, consider a krill oil cap daily      Relevant Orders   Lipid panel (Completed)   Hemoglobin A1c (Completed)   Comprehensive metabolic panel (Completed)   Microalbumin / creatinine urine ratio (Completed)   Uncontrolled type 2 diabetes mellitus with hyperglycemia ([place])    hgba1c to be checked  minimize simple carbs. Increase exercise as tolerated. Continue current meds       Relevant Orders   Lipid panel (Completed)   Hemoglobin A1c (Completed)   Comprehensive metabolic panel (Completed)   Microalbumin / creatinine urine ratio (Completed)    Other Visit Diagnoses    Bipolar 2 disorder ([place])    -  Primary   Relevant Medications   OLANZapine (ZYPREXA) 5 MG tablet      Follow-up: Return in about 6 months (around 07/12/2020), or if symptoms worsen or fail to improve, for hypertension, hyperlipidemia,  diabetes II.  Rosalita Chessman  Chase, DO

## 2020-01-10 NOTE — Telephone Encounter (Signed)
Added to med list and sent to Dr Etter Sjogren as a Juluis Rainier

## 2020-01-11 NOTE — Assessment & Plan Note (Signed)
hgba1c to be checked minimize simple carbs. Increase exercise as tolerated. Continue current meds 

## 2020-01-11 NOTE — Assessment & Plan Note (Signed)
Well controlled, no changes to meds. Encouraged heart healthy diet such as the DASH diet and exercise as tolerated.  °

## 2020-01-11 NOTE — Assessment & Plan Note (Signed)
Per oncology °

## 2020-01-11 NOTE — Assessment & Plan Note (Signed)
Encouraged heart healthy diet, increase exercise, avoid trans fats, consider a krill oil cap daily 

## 2020-01-11 NOTE — Assessment & Plan Note (Signed)
Per psych 

## 2020-01-14 ENCOUNTER — Other Ambulatory Visit: Payer: Self-pay

## 2020-01-14 ENCOUNTER — Inpatient Hospital Stay: Payer: Medicare HMO | Attending: Hematology & Oncology | Admitting: Hematology & Oncology

## 2020-01-14 ENCOUNTER — Telehealth: Payer: Self-pay | Admitting: Hematology & Oncology

## 2020-01-14 ENCOUNTER — Inpatient Hospital Stay: Payer: Medicare HMO

## 2020-01-14 ENCOUNTER — Encounter: Payer: Self-pay | Admitting: Hematology & Oncology

## 2020-01-14 VITALS — BP 126/68 | HR 110 | Temp 97.3°F | Resp 18 | Wt 155.0 lb

## 2020-01-14 DIAGNOSIS — Z853 Personal history of malignant neoplasm of breast: Secondary | ICD-10-CM | POA: Insufficient documentation

## 2020-01-14 DIAGNOSIS — Z9223 Personal history of estrogen therapy: Secondary | ICD-10-CM | POA: Diagnosis not present

## 2020-01-14 DIAGNOSIS — Z170 Estrogen receptor positive status [ER+]: Secondary | ICD-10-CM | POA: Diagnosis not present

## 2020-01-14 DIAGNOSIS — E559 Vitamin D deficiency, unspecified: Secondary | ICD-10-CM

## 2020-01-14 DIAGNOSIS — C50312 Malignant neoplasm of lower-inner quadrant of left female breast: Secondary | ICD-10-CM

## 2020-01-14 LAB — CBC WITH DIFFERENTIAL (CANCER CENTER ONLY)
Abs Immature Granulocytes: 0.04 10*3/uL (ref 0.00–0.07)
Basophils Absolute: 0.1 10*3/uL (ref 0.0–0.1)
Basophils Relative: 1 %
Eosinophils Absolute: 0.2 10*3/uL (ref 0.0–0.5)
Eosinophils Relative: 2 %
HCT: 43.2 % (ref 36.0–46.0)
Hemoglobin: 13.7 g/dL (ref 12.0–15.0)
Immature Granulocytes: 1 %
Lymphocytes Relative: 23 %
Lymphs Abs: 2 10*3/uL (ref 0.7–4.0)
MCH: 29.5 pg (ref 26.0–34.0)
MCHC: 31.7 g/dL (ref 30.0–36.0)
MCV: 93.1 fL (ref 80.0–100.0)
Monocytes Absolute: 0.9 10*3/uL (ref 0.1–1.0)
Monocytes Relative: 10 %
Neutro Abs: 5.5 10*3/uL (ref 1.7–7.7)
Neutrophils Relative %: 63 %
Platelet Count: 186 10*3/uL (ref 150–400)
RBC: 4.64 MIL/uL (ref 3.87–5.11)
RDW: 13.2 % (ref 11.5–15.5)
WBC Count: 8.6 10*3/uL (ref 4.0–10.5)
nRBC: 0 % (ref 0.0–0.2)

## 2020-01-14 LAB — CMP (CANCER CENTER ONLY)
ALT: 25 U/L (ref 0–44)
AST: 20 U/L (ref 15–41)
Albumin: 4.3 g/dL (ref 3.5–5.0)
Alkaline Phosphatase: 40 U/L (ref 38–126)
Anion gap: 8 (ref 5–15)
BUN: 12 mg/dL (ref 8–23)
CO2: 30 mmol/L (ref 22–32)
Calcium: 9.5 mg/dL (ref 8.9–10.3)
Chloride: 102 mmol/L (ref 98–111)
Creatinine: 0.74 mg/dL (ref 0.44–1.00)
GFR, Est AFR Am: >60 mL/min (ref >60)
GFR, Estimated: >60 mL/min (ref >60)
Glucose, Bld: 160 mg/dL — ABNORMAL HIGH (ref 70–99)
Potassium: 4.6 mmol/L (ref 3.5–5.1)
Sodium: 140 mmol/L (ref 135–145)
Total Bilirubin: 0.5 mg/dL (ref 0.3–1.2)
Total Protein: 7.1 g/dL (ref 6.5–8.1)

## 2020-01-14 LAB — VITAMIN D 25 HYDROXY (VIT D DEFICIENCY, FRACTURES): Vit D, 25-Hydroxy: 65.03 ng/mL (ref 30–100)

## 2020-01-14 NOTE — Telephone Encounter (Signed)
Appointments scheduled patient to get updates from My Chart 3/26 los

## 2020-02-15 DIAGNOSIS — F250 Schizoaffective disorder, bipolar type: Secondary | ICD-10-CM | POA: Diagnosis not present

## 2020-02-17 ENCOUNTER — Other Ambulatory Visit: Payer: Self-pay | Admitting: Family Medicine

## 2020-02-28 ENCOUNTER — Other Ambulatory Visit: Payer: Self-pay | Admitting: Family Medicine

## 2020-03-17 ENCOUNTER — Ambulatory Visit: Payer: Medicare HMO | Admitting: Podiatry

## 2020-03-17 ENCOUNTER — Other Ambulatory Visit: Payer: Self-pay

## 2020-03-17 DIAGNOSIS — L989 Disorder of the skin and subcutaneous tissue, unspecified: Secondary | ICD-10-CM

## 2020-03-17 DIAGNOSIS — E119 Type 2 diabetes mellitus without complications: Secondary | ICD-10-CM | POA: Diagnosis not present

## 2020-03-19 ENCOUNTER — Emergency Department (HOSPITAL_BASED_OUTPATIENT_CLINIC_OR_DEPARTMENT_OTHER): Payer: Medicare HMO

## 2020-03-19 ENCOUNTER — Other Ambulatory Visit: Payer: Self-pay

## 2020-03-19 ENCOUNTER — Encounter (HOSPITAL_BASED_OUTPATIENT_CLINIC_OR_DEPARTMENT_OTHER): Payer: Self-pay | Admitting: *Deleted

## 2020-03-19 ENCOUNTER — Emergency Department (HOSPITAL_BASED_OUTPATIENT_CLINIC_OR_DEPARTMENT_OTHER)
Admission: EM | Admit: 2020-03-19 | Discharge: 2020-03-19 | Disposition: A | Discharge status: Home / Self Care | Payer: Medicare HMO | Attending: Emergency Medicine | Admitting: Emergency Medicine

## 2020-03-19 DIAGNOSIS — Z87891 Personal history of nicotine dependence: Secondary | ICD-10-CM | POA: Diagnosis not present

## 2020-03-19 DIAGNOSIS — Z882 Allergy status to sulfonamides status: Secondary | ICD-10-CM | POA: Diagnosis not present

## 2020-03-19 DIAGNOSIS — Z20822 Contact with and (suspected) exposure to covid-19: Secondary | ICD-10-CM | POA: Diagnosis not present

## 2020-03-19 DIAGNOSIS — Z7982 Long term (current) use of aspirin: Secondary | ICD-10-CM | POA: Insufficient documentation

## 2020-03-19 DIAGNOSIS — R05 Cough: Secondary | ICD-10-CM | POA: Diagnosis not present

## 2020-03-19 DIAGNOSIS — R0981 Nasal congestion: Secondary | ICD-10-CM | POA: Diagnosis not present

## 2020-03-19 DIAGNOSIS — E785 Hyperlipidemia, unspecified: Secondary | ICD-10-CM | POA: Insufficient documentation

## 2020-03-19 DIAGNOSIS — Z7984 Long term (current) use of oral hypoglycemic drugs: Secondary | ICD-10-CM | POA: Diagnosis not present

## 2020-03-19 DIAGNOSIS — Z888 Allergy status to other drugs, medicaments and biological substances status: Secondary | ICD-10-CM | POA: Diagnosis not present

## 2020-03-19 DIAGNOSIS — Z853 Personal history of malignant neoplasm of breast: Secondary | ICD-10-CM | POA: Diagnosis not present

## 2020-03-19 DIAGNOSIS — R059 Cough, unspecified: Secondary | ICD-10-CM

## 2020-03-19 DIAGNOSIS — E1169 Type 2 diabetes mellitus with other specified complication: Secondary | ICD-10-CM | POA: Insufficient documentation

## 2020-03-19 DIAGNOSIS — R0602 Shortness of breath: Secondary | ICD-10-CM | POA: Diagnosis not present

## 2020-03-19 DIAGNOSIS — J45909 Unspecified asthma, uncomplicated: Secondary | ICD-10-CM | POA: Diagnosis not present

## 2020-03-19 DIAGNOSIS — R000 Tachycardia, unspecified: Secondary | ICD-10-CM | POA: Diagnosis not present

## 2020-03-19 DIAGNOSIS — Z79899 Other long term (current) drug therapy: Secondary | ICD-10-CM | POA: Insufficient documentation

## 2020-03-19 LAB — COMPREHENSIVE METABOLIC PANEL
ALT: 56 U/L — ABNORMAL HIGH (ref 0–44)
AST: 57 U/L — ABNORMAL HIGH (ref 15–41)
Albumin: 4.2 g/dL (ref 3.5–5.0)
Alkaline Phosphatase: 39 U/L (ref 38–126)
Anion gap: 13 (ref 5–15)
BUN: 13 mg/dL (ref 8–23)
CO2: 25 mmol/L (ref 22–32)
Calcium: 9.5 mg/dL (ref 8.9–10.3)
Chloride: 99 mmol/L (ref 98–111)
Creatinine, Ser: 0.92 mg/dL (ref 0.44–1.00)
GFR calc Af Amer: >60 mL/min (ref >60)
GFR calc non Af Amer: >60 mL/min (ref >60)
Glucose, Bld: 133 mg/dL — ABNORMAL HIGH (ref 70–99)
Potassium: 4.3 mmol/L (ref 3.5–5.1)
Sodium: 137 mmol/L (ref 135–145)
Total Bilirubin: 0.8 mg/dL (ref 0.3–1.2)
Total Protein: 7.4 g/dL (ref 6.5–8.1)

## 2020-03-19 LAB — CBC WITH DIFFERENTIAL/PLATELET
Abs Immature Granulocytes: 0.03 10*3/uL (ref 0.00–0.07)
Basophils Absolute: 0 10*3/uL (ref 0.0–0.1)
Basophils Relative: 0 %
Eosinophils Absolute: 0 10*3/uL (ref 0.0–0.5)
Eosinophils Relative: 1 %
HCT: 45.5 % (ref 36.0–46.0)
Hemoglobin: 15 g/dL (ref 12.0–15.0)
Immature Granulocytes: 1 %
Lymphocytes Relative: 17 %
Lymphs Abs: 1 10*3/uL (ref 0.7–4.0)
MCH: 30.2 pg (ref 26.0–34.0)
MCHC: 33 g/dL (ref 30.0–36.0)
MCV: 91.7 fL (ref 80.0–100.0)
Monocytes Absolute: 0.7 10*3/uL (ref 0.1–1.0)
Monocytes Relative: 13 %
Neutro Abs: 3.9 10*3/uL (ref 1.7–7.7)
Neutrophils Relative %: 68 %
Platelets: 160 10*3/uL (ref 150–400)
RBC: 4.96 MIL/uL (ref 3.87–5.11)
RDW: 13.3 % (ref 11.5–15.5)
WBC: 5.7 10*3/uL (ref 4.0–10.5)
nRBC: 0 % (ref 0.0–0.2)

## 2020-03-19 LAB — D-DIMER, QUANTITATIVE: D-Dimer, Quant: 0.33 ug/mL-FEU (ref 0.00–0.50)

## 2020-03-19 LAB — SARS CORONAVIRUS 2 BY RT PCR (HOSPITAL ORDER, PERFORMED IN ~~LOC~~ HOSPITAL LAB): SARS Coronavirus 2: NEGATIVE

## 2020-03-19 LAB — TROPONIN I (HIGH SENSITIVITY): Troponin I (High Sensitivity): 5 ng/L (ref <18)

## 2020-03-19 IMAGING — DX DG CHEST 1V PORT
1 series · 1 of 1 positions shown · non-contrast
Comparison: [DATE]

CLINICAL DATA: Shortness of breath.  Tachycardia.

EXAM:
PORTABLE CHEST 1 VIEW

[chest ap]
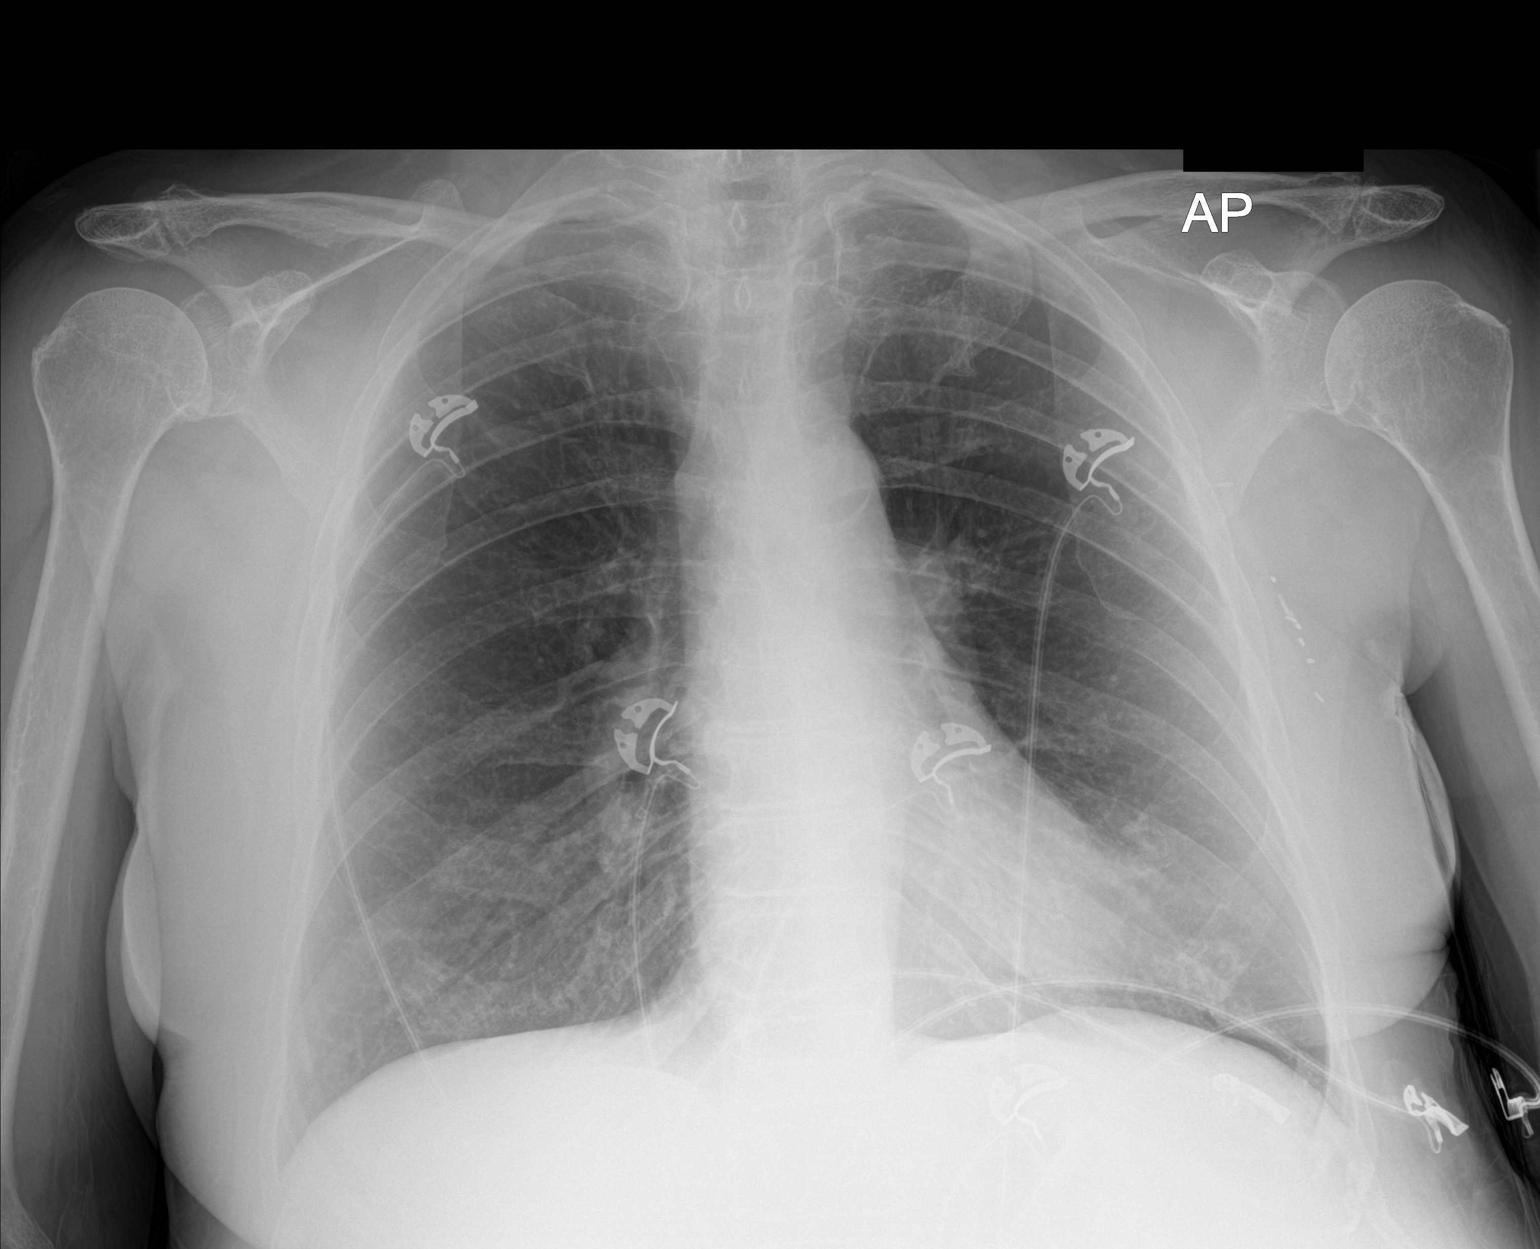

[1 of 1 positions shown; findings below may reference images not displayed]

FINDINGS: The heart size and mediastinal contours are within normal limits.
Both lungs are clear. The visualized skeletal structures are
unremarkable.
IMPRESSION: No active disease.

## 2020-03-19 IMAGING — CT CT ANGIO CHEST
2 of 8 series · 19 of 36 positions shown · IV contrast (omnipaque)
Comparison: [DATE] chest radiograph

CLINICAL DATA: 73-year-old female with acute shortness of breath
and cough.

EXAM:
CT ANGIOGRAPHY CHEST WITH CONTRAST
TECHNIQUE: Multidetector CT imaging of the chest was performed using the
standard protocol during bolus administration of intravenous
contrast. Multiplanar CT image reconstructions and MIPs were
obtained to evaluate the vascular anatomy.
CONTRAST:  72mL OMNIPAQUE IOHEXOL 350 MG/ML SOLN

[Series 6: pe thins · axial · 0.76mm/px · z∈[-205,+47]mm · 18 of 282 slices shown]
[im 15/282  lung]
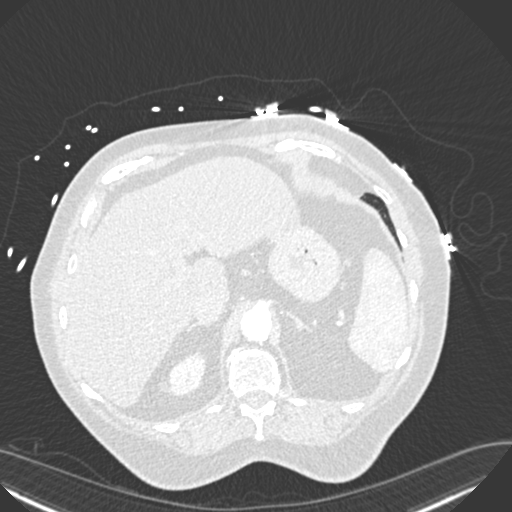
[im 30/282  mediastinal]
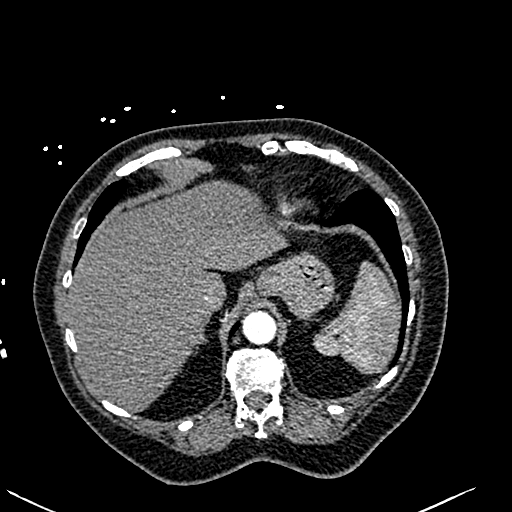
[im 45/282  lung]
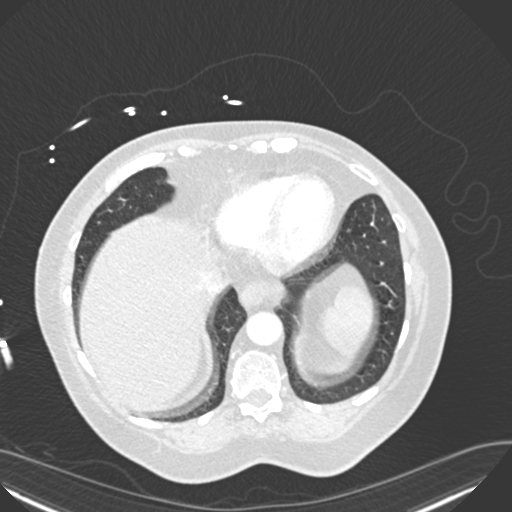
[im 60/282  mediastinal]
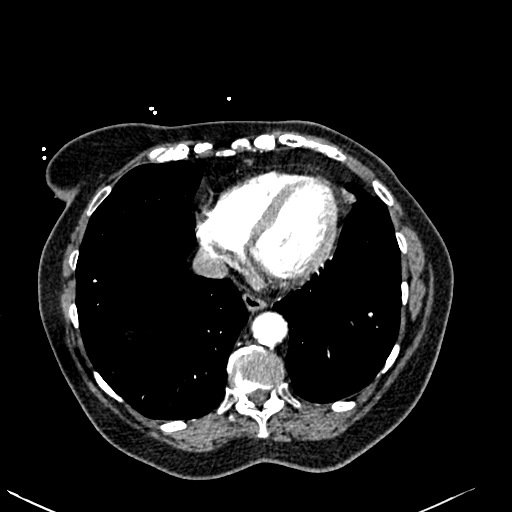
[im 74/282  lung]
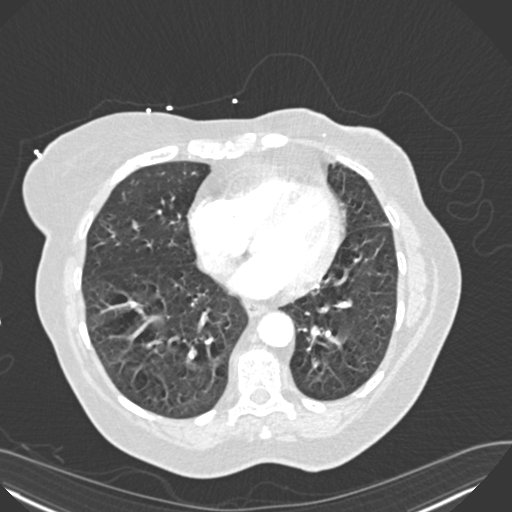
[im 89/282  mediastinal]
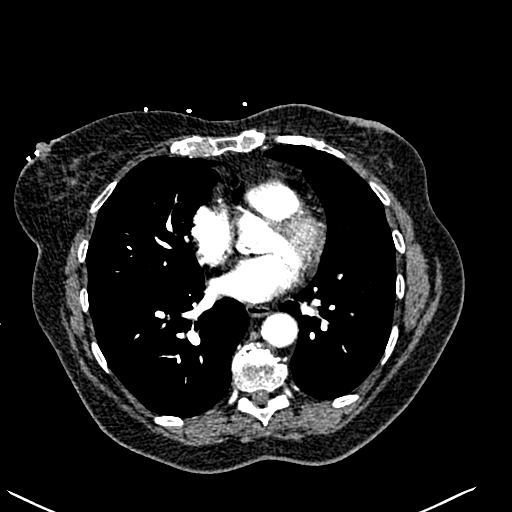
[im 104/282  lung]
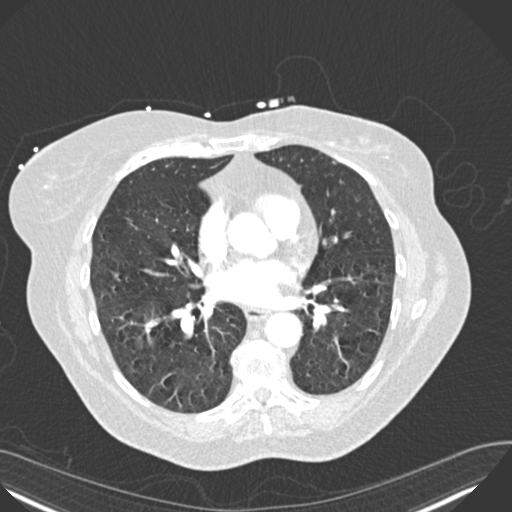
[im 119/282  mediastinal]
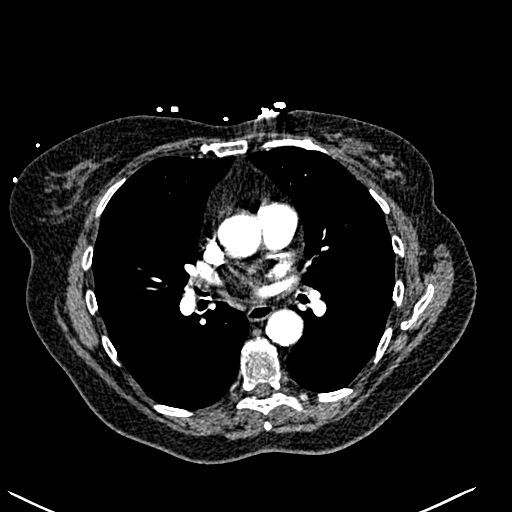
[im 134/282  lung]
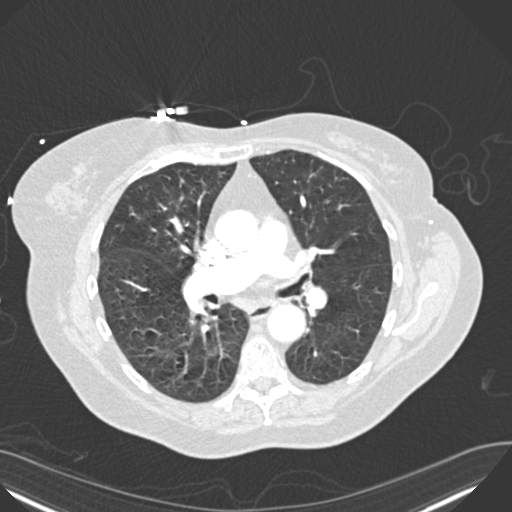
[im 148/282  mediastinal]
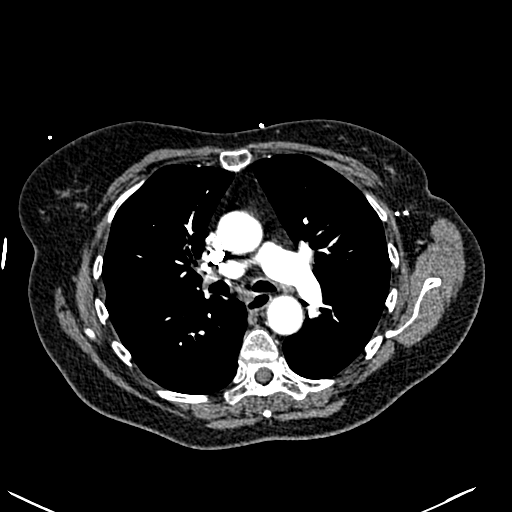
[im 163/282  lung]
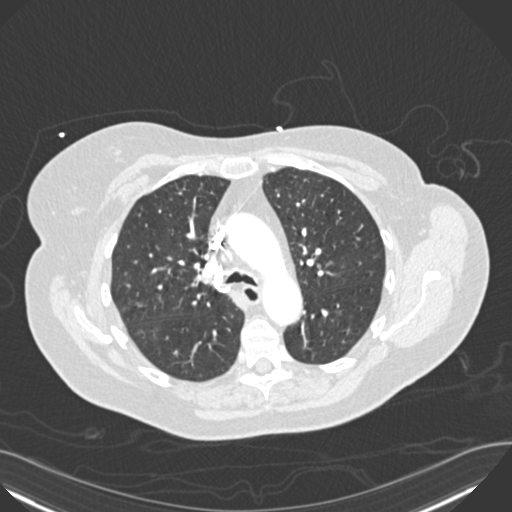
[im 178/282  mediastinal]
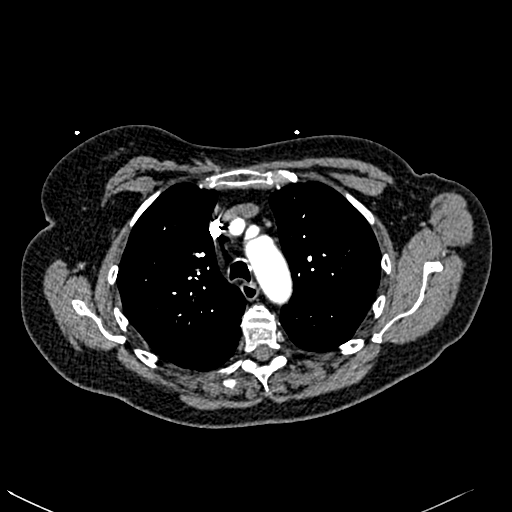
[im 193/282  lung]
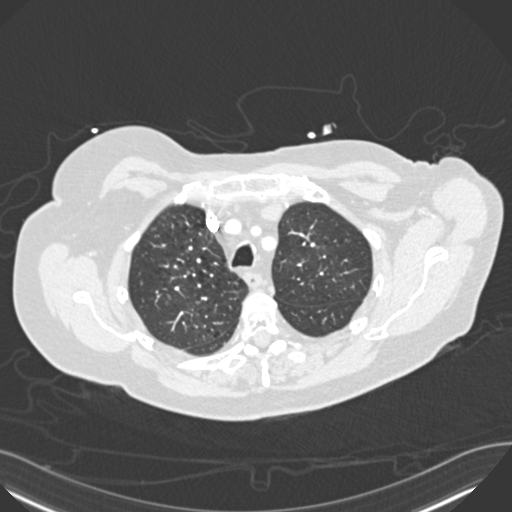
[im 208/282  mediastinal]
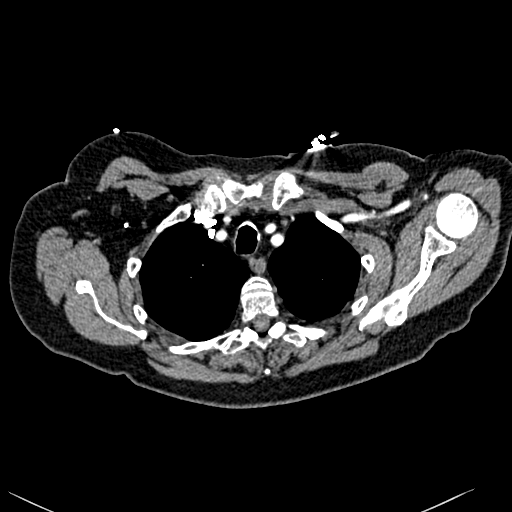
[im 222/282  lung]
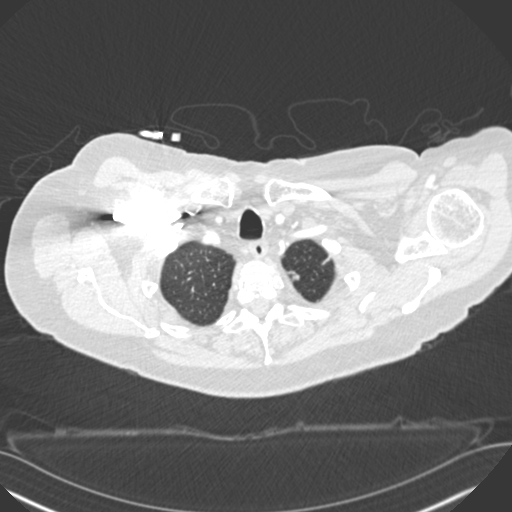
[im 237/282  mediastinal]
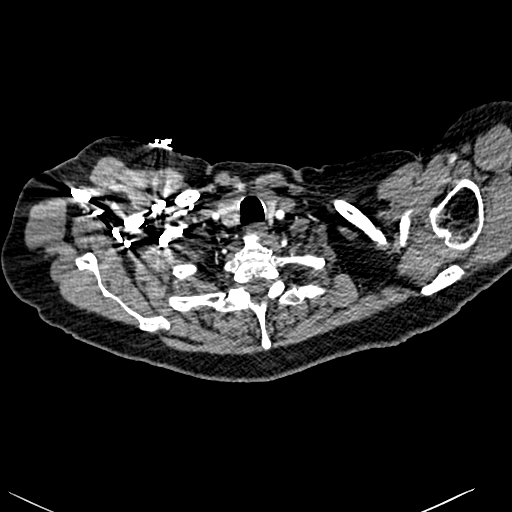
[im 252/282  lung]
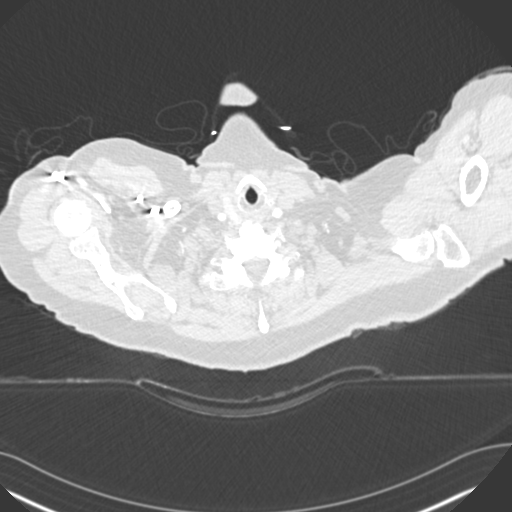
[im 267/282  mediastinal]
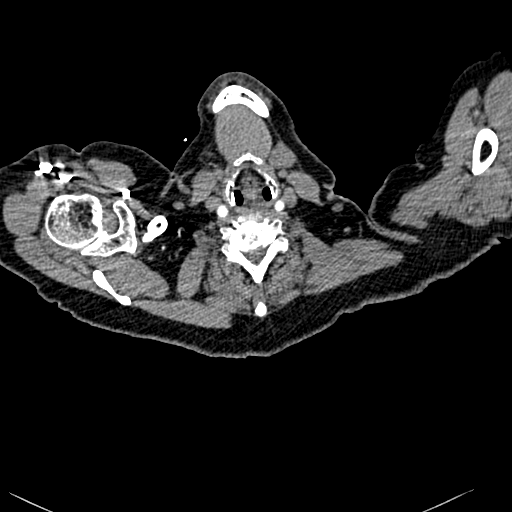

[Series 7: pe coronal mpr · coronal · 0.58mm/px · 1 of 151 slices shown]
[im 76/151  mediastinal]
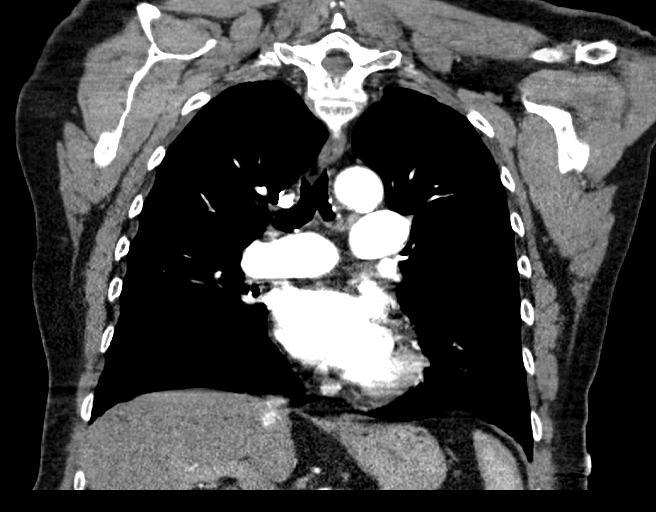

[19 of 36 positions shown; findings below may reference images not displayed]

FINDINGS: Cardiovascular: This is a technically satisfactory study for
evaluation of pulmonary emboli but some motion artifact in the LOWER
lungs decreases sensitivity.

No pulmonary emboli are identified. Coronary artery and aortic
atherosclerotic calcifications are present. No thoracic aortic
aneurysm or pericardial effusion identified. Heart size is normal.

Mediastinum/Nodes: No enlarged mediastinal, hilar, or axillary lymph
nodes. Thyroid gland, trachea, and esophagus demonstrate no
significant findings.

Lungs/Pleura: Lungs are clear. No pleural effusion or pneumothorax.

Upper Abdomen: No acute abnormality.

Musculoskeletal: No chest wall abnormality. No acute or significant
osseous findings.

Review of the MIP images confirms the above findings.
IMPRESSION: 1. No evidence of acute abnormality. No evidence of pulmonary
emboli.
2. Coronary artery disease.
3. Aortic Atherosclerosis ([NE]-[NE]).

## 2020-03-19 MED ORDER — IOHEXOL 350 MG/ML SOLN
100.0000 mL | Freq: Once | INTRAVENOUS | Status: AC | PRN
  Administered 2020-03-19: 72 mL via INTRAVENOUS

## 2020-03-19 NOTE — Discharge Instructions (Signed)
Please read and follow all provided instructions.  Your diagnoses today include:  1. Tachycardia   2. Cough     Tests performed today include:  An EKG of your heart - shows fast heart rate  A chest x-ray - no sign of infection  Cardiac enzymes - a blood test for heart muscle damage, negative for stress on the heart  Blood counts and electrolytes  CT scan of the chest - no blood clots or pneumonia  COVID test - negative  Vital signs. See below for your results today.   Medications prescribed:   None  Take any prescribed medications only as directed.  Follow-up instructions: Please follow-up with your primary care provider as soon as you can for further evaluation of your symptoms.   Return instructions:  SEEK IMMEDIATE MEDICAL ATTENTION IF:  You have severe chest pain, especially if the pain is crushing or pressure-like and spreads to the arms, back, neck, or jaw, or if you have sweating, nausea (feeling sick to your stomach), or shortness of breath. THIS IS AN EMERGENCY. Don't wait to see if the pain will go away. Get medical help at once. Call 911 or [phone] (operator). DO NOT drive yourself to the hospital.   Your chest pain gets worse and does not go away with rest.   You have an attack of chest pain lasting longer than usual, despite rest and treatment with the medications your caregiver has prescribed.   You wake from sleep with chest pain or shortness of breath.  You feel dizzy or faint.  You have chest pain not typical of your usual pain for which you originally saw your caregiver.   You have any other emergent concerns regarding your health.  Additional Information: Chest pain comes from many different causes. Your caregiver has diagnosed you as having chest pain that is not specific for one problem, but does not require admission.  You are at low risk for an acute heart condition or other serious illness.   Your vital signs today were: BP 130/61   Pulse (!)  108   Temp 99.5 F (37.5 C) (Oral)   Resp (!) 26   Ht 5\' 3"  (1.6 m)   Wt 68 kg   SpO2 93%   BMI 26.57 kg/m  If your blood pressure (BP) was elevated above 135/85 this visit, please have this repeated by your doctor within one month. --------------

## 2020-03-19 NOTE — ED Notes (Signed)
ED Provider at bedside. 

## 2020-03-19 NOTE — ED Notes (Signed)
Ambulated in room on r/a HR 116-122, RR 18-20, denies increase DOE, SpO2 94-96% while ambulating.

## 2020-03-19 NOTE — ED Provider Notes (Signed)
[facility] EMERGENCY DEPARTMENT Provider Note   CSN: [id number] Arrival date & time: 03/19/20  1338     History Chief Complaint  Patient presents with  . Tachycardia    Michelle Long is a 74 y.o. female.  Patient with history of breast cancer, bladder cancer, GERD, hypercholesteremia -- presents the emergency department today for elevated heart rate.  Patient states that 3 nights ago she had an episode of GERD which "started everything off".  The burning sensation in her chest she states is typical for her reflux symptoms.  She developed a cough with this as well as some shortness of breath with exertion.  She called a nurse and they recommended that she go to an emergency department to be evaluated for a heart attack.  She notes that [facility] closed on Friday (today is Sunday) and Saturday so she did not seek follow-up.  [facility] and they referred her to the emergency department given her elevated heart rate.  They did a chest x-ray there which was normal per her report and a point-of-care Covid test which was negative.  Patient denies any chest pains.  She is able to do activities at home but states that she feels more wiped out and has let things like the dishes "get behind".  She denies any lower extremity swelling.  No abdominal pain or distention.  No nausea, vomiting, diarrhea.  No diaphoresis.  Her temperature has been running in the 99's.  She denies recent weight fluctuations, hot or cold intolerance.  Denies history of thyroid disease.  Patient has received 2 doses of Covid vaccine.        Past Medical History:  Diagnosis Date  . Asthma   . Bipolar disorder ([place])   . Bladder cancer ([place])   . Breast cancer ([place])   . Cancer of lower-inner quadrant of left female breast ([place]) 12/08/2013  . Depression   . Diabetes mellitus   . GERD (gastroesophageal reflux disease)   . Hepatitis B infection   . History of transfusion of whole blood    Approximately 2009, due to breast cancer/chemo.  Mar[place] Kitchen Hyperlipidemia   . Personal history of chemotherapy   . Personal history of radiation therapy     Patient Active Problem List   Diagnosis Date Noted  . Porokeratosis 09/01/2019  . Uncontrolled type 2 diabetes mellitus with hyperglycemia ([place]) 01/08/2019  . Hyperlipidemia associated with type 2 diabetes mellitus ([place]) 01/08/2019  . Impacted cerumen of left ear 12/10/2016  . Essential hypertension 05/16/2016  . Contact dermatitis 02/17/2015  . Cancer of lower-inner quadrant of left female breast ([place]) 12/08/2013  . Estrogen deficiency 10/22/2013  . Athlete's foot 04/03/2013  . Elevated BP 11/05/2012  . Grief 10/06/2012  . Fatigue 02/13/2012  . Easy bruising 09/25/2011  . Leg cramps 08/17/2011  . Arm swelling 08/16/2011  . UTI (lower urinary tract infection) 06/04/2011  . Vitamin D deficiency 05/29/2011  . Preventative health care 05/29/2011  . Hx of abnormal cervical Pap smear 05/29/2011  . Dysuria 05/15/2011  . Dry skin 05/15/2011  . Asthma   . Bladder cancer ([place])   . Bipolar disorder ([place])   . Inadequately controlled diabetes mellitus ([place])   . GERD (gastroesophageal reflux disease)     Past Surgical History:  Procedure Laterality Date  . ABDOMINAL HYSTERECTOMY    . APPENDECTOMY  1961  . bladder cancer  2006  . BREAST LUMPECTOMY Left 2007  . PARTIAL HYSTERECTOMY  [place]     OB History   No obstetric history on file.     Family History  Problem Relation Age of Onset  . Arthritis Mother   . Breast cancer Mother   . Transient ischemic attack Mother   . Hypertension Mother   . Heart disease Father   . Diabetes Father   . Cancer Father        bladder cancer  . Throat cancer Father     Social History   Tobacco Use  . Smoking status: Former Smoker    Packs/day: 1.50    Years: 33.00    Pack years: 49.50    Types: Cigarettes    Start date: 12/08/1964    Quit date: 08/21/1998     Years since quitting: 21.5  . Smokeless tobacco: Never Used  . Tobacco comment: quit 16 years ago  Substance Use Topics  . Alcohol use: Yes    Alcohol/week: 0.0 standard drinks    Comment: very rarely  . Drug use: No    Home Medications Prior to Admission medications   Medication Sig Start Date End Date Taking? Authorizing Provider  albuterol (VENTOLIN HFA) 108 (90 Base) MCG/ACT inhaler INHALE TWO PUFFS INTO LUNGS EVERY 6 HOURS AS NEEDED FOR WHEEZING OR SHORTNESS OF BREATH 01/08/19   Carollee Herter, Yvonne R, DO  alendronate (FOSAMAX) 70 MG tablet TAKE 1 TABLET BY MOUTH ONCE A WEEK WITH  A  FULL  GLASS  OF  WATER  ON  AN  EMPTY  STOMACH 02/28/20   Ann Held, DO  aspirin 81 MG tablet Take 81 mg by mouth daily.    [provider]  b complex vitamins tablet Take 1 tablet by mouth daily.    [provider]  Blood Glucose Monitoring Suppl (TRUE METRIX AIR GLUCOSE METER) DEVI 1 Device by Does not apply route daily. 01/10/20   Ann Held, DO  budesonide-formoterol (SYMBICORT) 160-4.5 MCG/ACT inhaler Inhale 2 puffs into the lungs 2 (two) times daily. 01/10/20   Ann Held, DO  Calcium Carb-Cholecalciferol (CALCIUM 1000 + D PO) Take by mouth.    [provider]  Cholecalciferol (VITAMIN D) 2000 units CAPS Take 5,000 Units by mouth daily.     [provider]  citalopram (CELEXA) 10 MG tablet Take 0.5 tablets (5 mg total) by mouth at bedtime. 01/11/19   Roma Schanz R, DO  ezetimibe (ZETIA) 10 MG tablet TAKE 1 TABLET BY MOUTH DAILY. 02/17/20   Ann Held, DO  LORazepam (ATIVAN) 0.5 MG tablet Take 1 tablet (0.5 mg total) by mouth 2 (two) times daily as needed. 09/02/16   Roma Schanz R, DO  losartan (COZAAR) 50 MG tablet TAKE 1 TABLET EVERY DAY 11/29/19   Carollee Herter, Kendrick Fries R, DO  metFORMIN (GLUCOPHAGE) 1000 MG tablet TAKE 1 TABLET TWO TIMES DAILY WITH A MEAL. 11/29/19   Carollee Herter, Alferd Apa, DO  Multiple Vitamin  (MULTIVITAMIN) tablet Take 1 tablet by mouth daily.    [provider]  Multiple Vitamins-Minerals (HAIR SKIN AND NAILS FORMULA) TABS Take 1 tablet by mouth daily. 2,500 mg daily 01/10/20   Carollee Herter, Yvonne R, DO  OLANZapine (ZYPREXA) 5 MG tablet 1/2 tablet daily 01/10/20   Carollee Herter, Kendrick Fries R, DO  pantoprazole (PROTONIX) 40 MG tablet TAKE 1 TABLET EVERY DAY 12/01/19   Carollee Herter, Alferd Apa, DO  rosuvastatin (CRESTOR) 5 MG tablet Take 1 tablet (  5 mg total) by mouth 3 (three) times a week. 10/27/19   Copland, Gay Filler, MD  [facility] injection  07/25/19   [provider]  Spacer/Aero Chamber Mouthpiece MISC To use with inhaler 02/18/17   Carollee Herter, [place] R, DO  TRUE METRIX BLOOD GLUCOSE TEST test strip CHECK BLOOD SUGAR ONE TIME DAILY 09/29/19   Carollee Herter, Kendrick Fries R, DO  TRUEplus Lancets 33G MISC TEST ONE TIME DAILY AS DIRECTED 10/01/19   Ann Held, DO    Allergies    Fluoxetine, Lamotrigine, Sulfa antibiotics, Statins, Fluoxetine hcl, and Sulfa drugs cross reactors  Review of Systems   Review of Systems  Constitutional: Negative for fever.  HENT: Negative for rhinorrhea and sore throat.   Eyes: Negative for redness.  Respiratory: Positive for cough and shortness of breath.   Cardiovascular: Positive for chest pain (burning/GERD). Negative for palpitations and leg swelling.  Gastrointestinal: Negative for abdominal pain, diarrhea, nausea and vomiting.  Genitourinary: Negative for dysuria.  Musculoskeletal: Negative for myalgias.  Skin: Negative for rash.  Neurological: Negative for headaches.    Physical Exam Updated Vital Signs BP (!) 145/71 (BP Location: Left Arm)   Pulse (!) 124   Temp 99.5 F (37.5 C) (Oral)   Resp (!) 26   Ht 5\' 3"  (1.6 m)   Wt 68 kg   SpO2 91%   BMI 26.57 kg/m   Physical Exam Vitals and nursing note reviewed.  Constitutional:      Appearance: She is well-developed. She is not diaphoretic.  HENT:     Head: Normocephalic  and atraumatic.     Mouth/Throat:     Mouth: Mucous membranes are not dry.  Eyes:     Conjunctiva/sclera: Conjunctivae normal.  Neck:     Vascular: Normal carotid pulses. No carotid bruit or JVD.     Trachea: Trachea normal. No tracheal deviation.  Cardiovascular:     Rate and Rhythm: Regular rhythm. Tachycardia present.     Pulses: No decreased pulses.     Heart sounds: Normal heart sounds, S1 normal and S2 normal. No murmur.  Pulmonary:     Effort: Pulmonary effort is normal. No respiratory distress.     Breath sounds: No wheezing.  Chest:     Chest wall: No tenderness.  Abdominal:     General: Bowel sounds are normal.     Palpations: Abdomen is soft.     Tenderness: There is no abdominal tenderness. There is no guarding or rebound.  Musculoskeletal:        General: Normal range of motion.     Cervical back: Normal range of motion and neck supple. No muscular tenderness.  Skin:    General: Skin is warm and dry.     Coloration: Skin is not pale.  Neurological:     Mental Status: She is alert.     ED Results / Procedures / Treatments   Labs (all labs ordered are listed, but only abnormal results are displayed) Labs Reviewed  COMPREHENSIVE METABOLIC PANEL - Abnormal; Notable for the following components:      Result Value   Glucose, Bld 133 (*)    AST 57 (*)    ALT 56 (*)    All other components within normal limits  SARS CORONAVIRUS 2 BY RT PCR (HOSPITAL ORDER, [facility])  CBC WITH DIFFERENTIAL/PLATELET  D-DIMER, QUANTITATIVE (NOT AT [facility])  TROPONIN I (HIGH SENSITIVITY)  TROPONIN I (HIGH SENSITIVITY)    ED ECG REPORT  Date: 03/19/2020  Rate: 119  Rhythm: sinus tachycardia  QRS Axis: normal  Intervals: normal  ST/T Wave abnormalities: normal  Conduction Disutrbances:none  Narrative Interpretation:   Old EKG Reviewed: changes noted, faster from 2018  I have personally reviewed the EKG tracing and agree with the computerized  printout as noted.  Radiology CT Angio Chest PE W and/or Wo Contrast  Result Date: 03/19/2020 CLINICAL DATA:  74 year old female with acute shortness of breath and cough. EXAM: CT ANGIOGRAPHY CHEST WITH CONTRAST TECHNIQUE: Multidetector CT imaging of the chest was performed using the standard protocol during bolus administration of intravenous contrast. Multiplanar CT image reconstructions and MIPs were obtained to evaluate the vascular anatomy. CONTRAST:  78mL OMNIPAQUE IOHEXOL 350 MG/ML SOLN COMPARISON:  10/10/2018 chest radiograph FINDINGS: Cardiovascular: This is a technically satisfactory study for evaluation of pulmonary emboli but some motion artifact in the LOWER lungs decreases sensitivity. No pulmonary emboli are identified. Coronary artery and aortic atherosclerotic calcifications are present. No thoracic aortic aneurysm or pericardial effusion identified. Heart size is normal. Mediastinum/Nodes: No enlarged mediastinal, hilar, or axillary lymph nodes. Thyroid gland, trachea, and esophagus demonstrate no significant findings. Lungs/Pleura: Lungs are clear. No pleural effusion or pneumothorax. Upper Abdomen: No acute abnormality. Musculoskeletal: No chest wall abnormality. No acute or significant osseous findings. Review of the MIP images confirms the above findings. IMPRESSION: 1. No evidence of acute abnormality. No evidence of pulmonary emboli. 2. Coronary artery disease. 3. Aortic Atherosclerosis (ICD10-I70.0). Electronically Signed   By: Margarette Canada M.D.   On: 03/19/2020 15:59   DG Chest Port 1 View  Result Date: 03/19/2020 CLINICAL DATA:  Shortness of breath.  Tachycardia. EXAM: PORTABLE CHEST 1 VIEW COMPARISON:  October 10, 2018 FINDINGS: The heart size and mediastinal contours are within normal limits. Both lungs are clear. The visualized skeletal structures are unremarkable. IMPRESSION: No active disease. Electronically Signed   By: Dorise Bullion III M.D   On: 03/19/2020 14:35     Procedures Procedures (including critical care time)  Medications Ordered in ED Medications  iohexol (OMNIPAQUE) 350 MG/ML injection 100 mL (72 mLs Intravenous Contrast Given 03/19/20 1530)    ED Course  I have reviewed the triage vital signs and the nursing notes.  Pertinent labs & imaging results that were available during my care of the patient were reviewed by me and considered in my medical decision making (see chart for details).  Patient seen and examined. Work-up initiated. Personally reviewed EKG.   Vital signs reviewed and are as follows: BP (!) 145/71 (BP Location: Left Arm)   Pulse (!) 112   Temp 99.5 F (37.5 C) (Oral)   Resp (!) 22   Ht 5\' 3"  (1.6 m)   Wt 68 kg   SpO2 95%   BMI 26.57 kg/m   2:40 PM CXR clear.   4:16 PM Pt d/w and seen by Dr. Langston Masker.   Patient has ambulated well and maintains oxygen saturation above 93%.  She had a CT angio of the chest which did not demonstrate any pulmonary embolism or signs of pneumonia.  Patient updated on all results.  Upon entering the room, her oxygen saturation is 93-94%.  She is not tachypneic or in any distress.  She has an occasional cough.  She is comfortable discharged home and will use Delsym for cough control.  Encouraged return to the emergency department if she has worsening trouble breathing or shortness of breath, change in type or severity of chest pain.  She verbalizes understanding agrees  with plan.  Clinical Course as of Mar 20 1615  Sun Mar 19, 2020  1515 74 yo female w/ hx of tachycardia (HR ~100 in recent office visits), GERD, asthma, non-smoker, presenting to ED with SOB and cough and fatigue.  Patient reports the onset of symptoms several days ago.  She says she felt like it was her typical acid reflux, she says she suffers from this very severely, can often feel fatigued during a bad episode of reflux.  She decided go to urgent care today as she was feeling more lethargic than usual.  At urgent  care she was noted to be tachycardic and referred to the emergency department for further cardiac monitoring and work-up.  Here in the ED she feels more or less back to her baseline, may be only minor fatigue.  On exam she is mildly tachycardic with a heart rate of 110 115 bpm.  Her EKG shows sinus tachycardia with no ischemic changes.  She is satting 90 to 92% on room air (she says her baseline is 94-95%).  No tachypnea.  Very mild wheezing on exam.  Labs show troponin of 5.  CMP fairly unremarkable.  CBC also unremarkable.  Patient had both Covid vaccine shots, as well as a rapid point-of-care test at urgent care.  Will obtain a PCR here.  She did have a negative D-dimer ordered by PA Josh Rozina Pointer. However with her history of breast cancer, her description of fatigue, and her mildly worsening SpO2, my clinical suspicion for PE remains moderate to high, and I do think a CT PE is reasonable to rule out PE.  If this is negative, we can discharge her home.  She is in agreement with the plan.   [MT]  1552 SARS Coronavirus 2: NEGATIVE [MT]    Clinical Course User Index [MT] Wyvonnia Dusky, MD   MDM Rules/Calculators/A&P                      Patient with recent GERD/burning symptoms in her chest, cough, shortness of breath and fatigue --sent today for elevated heart rate.  Work-up shows normal troponin, sinus tachycardia on EKG.  She does not have any signs of a pulmonary embolism on imaging.  No signs of pneumonia on imaging.  Covid PCR negative.  She is not anemic.  She has normal work of breathing and is not in any distress.  She has an occasional cough.  She may have bronchitis given findings today.  Comfortable discharged home.  She is going to follow-up with her doctor in the next several days for recheck.  She seems reliable to return if worsening.  Final Clinical Impression(s) / ED Diagnoses Final diagnoses:  Tachycardia  Cough    Rx / DC Orders ED Discharge Orders    None       Carlisle Cater, PA-C 03/19/20 1619    Wyvonnia Dusky, MD 03/19/20 [phone]

## 2020-03-19 NOTE — ED Triage Notes (Signed)
Pt reports that she had 'acid reflux' with CP, congestion and SOB 3 nights ago. States that [facility]-tested negative. Reports that they referred her to the ED for an EKG due to rapid heartbeat. Pt reports CP from coughing. Denies fever.

## 2020-03-23 ENCOUNTER — Ambulatory Visit (INDEPENDENT_AMBULATORY_CARE_PROVIDER_SITE_OTHER): Payer: Medicare HMO | Admitting: Family Medicine

## 2020-03-23 ENCOUNTER — Other Ambulatory Visit: Payer: Self-pay

## 2020-03-23 ENCOUNTER — Encounter: Payer: Self-pay | Admitting: Family Medicine

## 2020-03-23 VITALS — BP 130/70 | HR 104 | Temp 97.3°F | Resp 18 | Ht 63.0 in | Wt 151.8 lb

## 2020-03-23 DIAGNOSIS — J40 Bronchitis, not specified as acute or chronic: Secondary | ICD-10-CM

## 2020-03-23 MED ORDER — PREDNISONE 10 MG PO TABS: ORAL_TABLET | ORAL | 0 refills | Status: DC

## 2020-03-23 MED ORDER — AZITHROMYCIN 250 MG PO TABS: ORAL_TABLET | ORAL | 0 refills | Status: DC

## 2020-03-23 NOTE — Patient Instructions (Signed)
Acute Bronchitis, Adult  Acute bronchitis is sudden or acute swelling of the air tubes (bronchi) in the lungs. Acute bronchitis causes these tubes to fill with mucus, which can make it hard to breathe. It can also cause coughing or wheezing. In adults, acute bronchitis usually goes away within 2 weeks. A cough caused by bronchitis may last up to 3 weeks. Smoking, allergies, and asthma can make the condition worse. What are the causes? This condition can be caused by germs and by substances that irritate the lungs, including:  Cold and flu viruses. The most common cause of this condition is the virus that causes the common cold.  Bacteria.  Substances that irritate the lungs, including: ? Smoke from cigarettes and other forms of tobacco. ? Dust and pollen. ? Fumes from chemical products, gases, or burned fuel. ? Other materials that pollute indoor or outdoor air.  Close contact with someone who has acute bronchitis. What increases the risk? The following factors may make you more likely to develop this condition:  A weak body's defense system, also called the immune system.  A condition that affects your lungs and breathing, such as asthma. What are the signs or symptoms? Common symptoms of this condition include:  Lung and breathing problems, such as: ? Coughing. This may bring up clear, yellow, or green mucus from your lungs (sputum). ? Wheezing. ? Having too much mucus in your lungs (chest congestion). ? Having shortness of breath.  A fever.  Chills.  Aches and pains, including: ? Tightness in your chest and other body aches. ? A sore throat. How is this diagnosed? This condition is usually diagnosed based on:  Your symptoms and medical history.  A physical exam. You may also have other tests, including tests to rule out other conditions, such pneumonia. These tests include:  A test of lung function.  Test of a mucus sample to look for the presence of  bacteria.  Tests to check the oxygen level in your blood.  Blood tests.  Chest X-ray. How is this treated? Most cases of acute bronchitis clear up over time without treatment. Your health care provider may recommend:  Drinking more fluids. This can thin your mucus, which may improve your breathing.  Taking a medicine for a fever or cough.  Using a device that gets medicine into your lungs (inhaler) to help improve breathing and control coughing.  Using a vaporizer or a humidifier. These are machines that add water to the air to help you breathe better. Follow these instructions at home: Activity  Get plenty of rest.  Return to your normal activities as told by your health care provider. Ask your health care provider what activities are safe for you. Lifestyle  Drink enough fluid to keep your urine pale yellow.  Do not drink alcohol.  Do not use any products that contain nicotine or tobacco, such as cigarettes, e-cigarettes, and chewing tobacco. If you need help quitting, ask your health care provider. Be aware that: ? Your bronchitis will get worse if you smoke or breathe in other people's smoke (secondhand smoke). ? Your lungs will heal faster if you quit smoking. General instructions   Take over-the-counter and prescription medicines only as told by your health care provider.  Use an inhaler, vaporizer, or humidifier as told by your health care provider.  If you have a sore throat, gargle with a salt-water mixture 3-4 times a day or as needed. To make a salt-water mixture, completely dissolve -1 tsp (3-6   g) of salt in 1 cup (237 mL) of warm water.  Keep all follow-up visits as told by your health care provider. This is important. How is this prevented? To lower your risk of getting this condition again:  Wash your hands often with soap and water. If soap and water are not available, use hand sanitizer.  Avoid contact with people who have cold symptoms.  Try not to  touch your mouth, nose, or eyes with your hands.  Avoid places where there are fumes from chemicals. Breathing these fumes will make your condition worse.  Get the flu shot every year. Contact a health care provider if:  Your symptoms do not improve after 2 weeks of treatment.  You vomit more than once or twice.  You have symptoms of dehydration such as: ? Dark urine. ? Dry skin or eyes. ? Increased thirst. ? Headaches. ? Confusion. ? Muscle cramps. Get help right away if you:  Cough up blood.  Feel pain in your chest.  Have severe shortness of breath.  Faint or keep feeling like you are going to faint.  Have a severe headache.  Have fever or chills that get worse. These symptoms may represent a serious problem that is an emergency. Do not wait to see if the symptoms will go away. Get medical help right away. Call your local emergency services (911 in the [place] Do not drive yourself to the hospital. Summary  Acute bronchitis is sudden (acute) inflammation of the air tubes (bronchi) between the windpipe and the lungs. In adults, acute bronchitis usually goes away within 2 weeks, although coughing may last 3 weeks or longer  Take over-the-counter and prescription medicines only as told by your health care provider.  Drink enough fluid to keep your urine pale yellow.  Contact a health care provider if your symptoms do not improve after 2 weeks of treatment.  Get help right away if you cough up blood, faint, or have chest pain or shortness of breath. This information is not intended to replace advice given to you by your health care provider. Make sure you discuss any questions you have with your health care provider. Document Revised: 06/21/2019 Document Reviewed: 04/30/2019 Elsevier Patient Education  2020 [employer]  

## 2020-03-23 NOTE — Progress Notes (Signed)
Patient ID: Michelle Long, female    DOB: 04/22/1946  Age: 74 y.o. MRN: [id number]    Subjective:  Subjective  HPI Michelle Long presents for f/u er for tachycardia and now states she is coughing and wheezing  No fever.   She tested neg for covid in uc and er.  Ct chest neg --- labs normal   Review of Systems  Constitutional: Negative for appetite change, diaphoresis, fatigue and unexpected weight change.  Eyes: Negative for pain, redness and visual disturbance.  Respiratory: Positive for cough, shortness of breath and wheezing. Negative for chest tightness.   Cardiovascular: Positive for palpitations. Negative for chest pain and leg swelling.  Endocrine: Negative for cold intolerance, heat intolerance, polydipsia, polyphagia and polyuria.  Genitourinary: Negative for difficulty urinating, dysuria and frequency.  Neurological: Negative for dizziness, light-headedness, numbness and headaches.    History Past Medical History:  Diagnosis Date  . Asthma   . Bipolar disorder ([place])   . Bladder cancer ([place])   . Breast cancer ([place])   . Cancer of lower-inner quadrant of left female breast ([place]) 12/08/2013  . Depression   . Diabetes mellitus   . GERD (gastroesophageal reflux disease)   . Hepatitis B infection   . History of transfusion of whole blood    Approximately 2009, due to breast cancer/chemo.  Marland Kitchen Hyperlipidemia   . Personal history of chemotherapy   . Personal history of radiation therapy     She has a past surgical history that includes Tonsillectomy (1951); Appendectomy (1961); Partial hysterectomy (1991); bladder cancer (2006); Abdominal hysterectomy; and Breast lumpectomy (Left, 2007).   Her family history includes Arthritis in her mother; Breast cancer in her mother; Cancer in her father; Diabetes in her father; Heart disease in her father; Hypertension in her mother; Throat cancer in her father; Transient ischemic attack in her mother.She reports that she quit smoking about 21  years ago. Her smoking use included cigarettes. She started smoking about 55 years ago. She has a 49.50 pack-year smoking history. She has never used smokeless tobacco. She reports current alcohol use. She reports that she does not use drugs.  Current Outpatient Medications on File Prior to Visit  Medication Sig Dispense Refill  . albuterol (VENTOLIN HFA) 108 (90 Base) MCG/ACT inhaler INHALE TWO PUFFS INTO LUNGS EVERY 6 HOURS AS NEEDED FOR WHEEZING OR SHORTNESS OF BREATH 54 each 1  . alendronate (FOSAMAX) 70 MG tablet TAKE 1 TABLET BY MOUTH ONCE A WEEK WITH  A  FULL  GLASS  OF  WATER  ON  AN  EMPTY  STOMACH 12 tablet 1  . aspirin 81 MG tablet Take 81 mg by mouth daily.    Marland Kitchen b complex vitamins tablet Take 1 tablet by mouth daily.    . Blood Glucose Monitoring Suppl (TRUE METRIX AIR GLUCOSE METER) DEVI 1 Device by Does not apply route daily. 1 each 0  . budesonide-formoterol (SYMBICORT) 160-4.5 MCG/ACT inhaler Inhale 2 puffs into the lungs 2 (two) times daily. 3 Inhaler 3  . Calcium Carb-Cholecalciferol (CALCIUM 1000 + D PO) Take by mouth.    . Cholecalciferol (VITAMIN D) 2000 units CAPS Take 5,000 Units by mouth daily.     . citalopram (CELEXA) 10 MG tablet Take 0.5 tablets (5 mg total) by mouth at bedtime. 45 tablet 1  . ezetimibe (ZETIA) 10 MG tablet TAKE 1 TABLET BY MOUTH DAILY. 90 tablet 1  . LORazepam (ATIVAN) 0.5 MG tablet Take 1 tablet (0.5 mg total) by mouth  2 (two) times daily as needed. 90 tablet 0  . losartan (COZAAR) 50 MG tablet TAKE 1 TABLET EVERY DAY 90 tablet 1  . metFORMIN (GLUCOPHAGE) 1000 MG tablet TAKE 1 TABLET TWO TIMES DAILY WITH A MEAL. 180 tablet 1  . Multiple Vitamin (MULTIVITAMIN) tablet Take 1 tablet by mouth daily.    . Multiple Vitamins-Minerals (HAIR SKIN AND NAILS FORMULA) TABS Take 1 tablet by mouth daily. 2,500 mg daily    . OLANZapine (ZYPREXA) 5 MG tablet 1/2 tablet daily 30 tablet 0  . pantoprazole (PROTONIX) 40 MG tablet TAKE 1 TABLET EVERY DAY 90 tablet 1  .  rosuvastatin (CRESTOR) 5 MG tablet Take 1 tablet (5 mg total) by mouth 3 (three) times a week. 90 tablet 5  . SHINGRIX injection     . Spacer/Aero Chamber Mouthpiece MISC To use with inhaler 1 each 1  . TRUE METRIX BLOOD GLUCOSE TEST test strip CHECK BLOOD SUGAR ONE TIME DAILY 100 strip 0  . TRUEplus Lancets 33G MISC TEST ONE TIME DAILY AS DIRECTED 100 each 1   No current facility-administered medications on file prior to visit.     Objective:  Objective  Physical Exam Vitals and nursing note reviewed.  Constitutional:      General: She is not in acute distress.    Appearance: She is well-developed.  HENT:     Right Ear: External ear normal.     Left Ear: External ear normal.     Nose: Nose normal.  Eyes:     Pupils: Pupils are equal, round, and reactive to light.  Cardiovascular:     Rate and Rhythm: Normal rate and regular rhythm.     Heart sounds: Normal heart sounds. No murmur.  Pulmonary:     Effort: No respiratory distress.     Breath sounds: Decreased air movement present. Wheezing present. No rhonchi or rales.  Chest:     Chest wall: No tenderness.  Musculoskeletal:     Cervical back: Normal range of motion and neck supple.  Neurological:     Mental Status: She is alert and oriented to person, place, and time.  Psychiatric:        Behavior: Behavior normal.        Thought Content: Thought content normal.        Judgment: Judgment normal.    BP 130/70 (BP Location: Right Arm, Patient Position: Sitting, Cuff Size: Normal)   Pulse (!) 104   Temp (!) 97.3 F (36.3 C) (Temporal)   Resp 18   Ht 5\' 3"  (1.6 m)   Wt 151 lb 12.8 oz (68.9 kg)   SpO2 93%   BMI 26.89 kg/m  Wt Readings from Last 3 Encounters:  03/23/20 151 lb 12.8 oz (68.9 kg)  03/19/20 150 lb (68 kg)  01/14/20 155 lb (70.3 kg)     Lab Results  Component Value Date   WBC 5.7 03/19/2020   HGB 15.0 03/19/2020   HCT 45.5 03/19/2020   PLT 160 03/19/2020   GLUCOSE 133 (H) 03/19/2020   CHOL 153  01/10/2020   TRIG 167.0 (H) 01/10/2020   HDL 50.10 01/10/2020   LDLDIRECT 145.6 06/22/2013   LDLCALC 69 01/10/2020   ALT 56 (H) 03/19/2020   AST 57 (H) 03/19/2020   NA 137 03/19/2020   K 4.3 03/19/2020   CL 99 03/19/2020   CREATININE 0.92 03/19/2020   BUN 13 03/19/2020   CO2 25 03/19/2020   TSH 0.80 01/30/2015   INR 0.97 09/25/2011  HGBA1C 6.8 (H) 01/10/2020   MICROALBUR 3.6 (H) 01/10/2020    CT Angio Chest PE W and/or Wo Contrast  Result Date: 03/19/2020 CLINICAL DATA:  74 year old female with acute shortness of breath and cough. EXAM: CT ANGIOGRAPHY CHEST WITH CONTRAST TECHNIQUE: Multidetector CT imaging of the chest was performed using the standard protocol during bolus administration of intravenous contrast. Multiplanar CT image reconstructions and MIPs were obtained to evaluate the vascular anatomy. CONTRAST:  72mL OMNIPAQUE IOHEXOL 350 MG/ML SOLN COMPARISON:  10/10/2018 chest radiograph FINDINGS: Cardiovascular: This is a technically satisfactory study for evaluation of pulmonary emboli but some motion artifact in the LOWER lungs decreases sensitivity. No pulmonary emboli are identified. Coronary artery and aortic atherosclerotic calcifications are present. No thoracic aortic aneurysm or pericardial effusion identified. Heart size is normal. Mediastinum/Nodes: No enlarged mediastinal, hilar, or axillary lymph nodes. Thyroid gland, trachea, and esophagus demonstrate no significant findings. Lungs/Pleura: Lungs are clear. No pleural effusion or pneumothorax. Upper Abdomen: No acute abnormality. Musculoskeletal: No chest wall abnormality. No acute or significant osseous findings. Review of the MIP images confirms the above findings. IMPRESSION: 1. No evidence of acute abnormality. No evidence of pulmonary emboli. 2. Coronary artery disease. 3. Aortic Atherosclerosis (ICD10-I70.0). Electronically Signed   By: Margarette Canada M.D.   On: 03/19/2020 15:59   DG Chest Port 1 View  Result Date:  03/19/2020 CLINICAL DATA:  Shortness of breath.  Tachycardia. EXAM: PORTABLE CHEST 1 VIEW COMPARISON:  October 10, 2018 FINDINGS: The heart size and mediastinal contours are within normal limits. Both lungs are clear. The visualized skeletal structures are unremarkable. IMPRESSION: No active disease. Electronically Signed   By: Dorise Bullion III M.D   On: 03/19/2020 14:35     Assessment & Plan:  Plan  I am having Michelle Neat. Ossa "Nicolette Bang" start on predniSONE and azithromycin. I am also having her maintain her aspirin, Vitamin D, b complex vitamins, LORazepam, Spacer/Aero Chamber Mouthpiece, multivitamin, albuterol, citalopram, Calcium Carb-Cholecalciferol (CALCIUM 1000 + D PO), True Metrix Blood Glucose Test, TRUEplus Lancets 33G, Shingrix, rosuvastatin, losartan, metFORMIN, pantoprazole, OLANZapine, True Metrix Air Glucose Meter, budesonide-formoterol, Hair Skin and Nails Formula, ezetimibe, and alendronate.  Meds ordered this encounter  Medications  . predniSONE (DELTASONE) 10 MG tablet    Sig: TAKE 3 TABLETS PO QD FOR 3 DAYS THEN TAKE 2 TABLETS PO QD FOR 3 DAYS THEN TAKE 1 TABLET PO QD FOR 3 DAYS THEN TAKE 1/2 TAB PO QD FOR 3 DAYS    Dispense:  20 tablet    Refill:  0  . azithromycin (ZITHROMAX Z-PAK) 250 MG tablet    Sig: As directed    Dispense:  6 each    Refill:  0    Problem List Items Addressed This Visit    None    Visit Diagnoses    Bronchitis    -  Primary   Relevant Medications   predniSONE (DELTASONE) 10 MG tablet   azithromycin (ZITHROMAX Z-PAK) 250 MG tablet    con't albuterol and symbicort  rto 2 weeks prn  Follow-up: Return in about 2 weeks (around 04/06/2020), or if symptoms worsen or fail to improve, for bronchitis.  Ann Held, DO

## 2020-03-24 ENCOUNTER — Encounter: Payer: Self-pay | Admitting: Podiatry

## 2020-03-24 NOTE — Progress Notes (Signed)
Subjective:  Patient ID: Michelle Long, female    DOB: [birth year],  MRN: [id number]  Chief Complaint  Patient presents with  . Nail Problem    pt is here for a nail trim as well as a wound check    74 y.o. female presents with the above complaint.  Patient presents with bilateral hyperkeratotic lesion submetatarsal 5 as well as submetatarsal 1.  She states that they are very painful in nature when ambulating.  She states that she has tried offloading it.  She denies any other acute complaints.  She would like to know if this could be debrided down.  She states that they are very painful to ambulate on.    Review of Systems: Negative except as noted in the HPI. Denies N/V/F/Ch.  Past Medical History:  Diagnosis Date  . Asthma   . Bipolar disorder ([place])   . Bladder cancer ([place])   . Breast cancer ([place])   . Cancer of lower-inner quadrant of left female breast ([place]) 12/08/2013  . Depression   . Diabetes mellitus   . GERD (gastroesophageal reflux disease)   . Hepatitis B infection   . History of transfusion of whole blood    Approximately 2009, due to breast cancer/chemo.  Marland Kitchen Hyperlipidemia   . Personal history of chemotherapy   . Personal history of radiation therapy     Current Outpatient Medications:  .  albuterol (VENTOLIN HFA) 108 (90 Base) MCG/ACT inhaler, INHALE TWO PUFFS INTO LUNGS EVERY 6 HOURS AS NEEDED FOR WHEEZING OR SHORTNESS OF BREATH, Disp: 54 each, Rfl: 1 .  alendronate (FOSAMAX) 70 MG tablet, TAKE 1 TABLET BY MOUTH ONCE A WEEK WITH  A  FULL  GLASS  OF  WATER  ON  AN  EMPTY  STOMACH, Disp: 12 tablet, Rfl: 1 .  aspirin 81 MG tablet, Take 81 mg by mouth daily., Disp: , Rfl:  .  b complex vitamins tablet, Take 1 tablet by mouth daily., Disp: , Rfl:  .  Blood Glucose Monitoring Suppl (TRUE METRIX AIR GLUCOSE METER) DEVI, 1 Device by Does not apply route daily., Disp: 1 each, Rfl: 0 .  budesonide-formoterol (SYMBICORT) 160-4.5 MCG/ACT inhaler, Inhale 2 puffs into the lungs 2  (two) times daily., Disp: 3 Inhaler, Rfl: 3 .  Calcium Carb-Cholecalciferol (CALCIUM 1000 + D PO), Take by mouth., Disp: , Rfl:  .  Cholecalciferol (VITAMIN D) 2000 units CAPS, Take 5,000 Units by mouth daily. , Disp: , Rfl:  .  citalopram (CELEXA) 10 MG tablet, Take 0.5 tablets (5 mg total) by mouth at bedtime., Disp: 45 tablet, Rfl: 1 .  ezetimibe (ZETIA) 10 MG tablet, TAKE 1 TABLET BY MOUTH DAILY., Disp: 90 tablet, Rfl: 1 .  LORazepam (ATIVAN) 0.5 MG tablet, Take 1 tablet (0.5 mg total) by mouth 2 (two) times daily as needed., Disp: 90 tablet, Rfl: 0 .  losartan (COZAAR) 50 MG tablet, TAKE 1 TABLET EVERY DAY, Disp: 90 tablet, Rfl: 1 .  metFORMIN (GLUCOPHAGE) 1000 MG tablet, TAKE 1 TABLET TWO TIMES DAILY WITH A MEAL., Disp: 180 tablet, Rfl: 1 .  Multiple Vitamin (MULTIVITAMIN) tablet, Take 1 tablet by mouth daily., Disp: , Rfl:  .  Multiple Vitamins-Minerals (HAIR SKIN AND NAILS FORMULA) TABS, Take 1 tablet by mouth daily. 2,500 mg daily, Disp: , Rfl:  .  OLANZapine (ZYPREXA) 5 MG tablet, 1/2 tablet daily, Disp: 30 tablet, Rfl: 0 .  pantoprazole (PROTONIX) 40 MG tablet, TAKE 1 TABLET EVERY DAY, Disp: 90 tablet, Rfl: 1 .  rosuvastatin (CRESTOR) 5 MG tablet, Take 1 tablet (5 mg total) by mouth 3 (three) times a week., Disp: 90 tablet, Rfl: 5 .  SHINGRIX injection, , Disp: , Rfl:  .  Spacer/Aero Chamber Mouthpiece MISC, To use with inhaler, Disp: 1 each, Rfl: 1 .  TRUE METRIX BLOOD GLUCOSE TEST test strip, CHECK BLOOD SUGAR ONE TIME DAILY, Disp: 100 strip, Rfl: 0 .  TRUEplus Lancets 33G MISC, TEST ONE TIME DAILY AS DIRECTED, Disp: 100 each, Rfl: 1 .  azithromycin (ZITHROMAX Z-PAK) 250 MG tablet, As directed, Disp: 6 each, Rfl: 0 .  predniSONE (DELTASONE) 10 MG tablet, TAKE 3 TABLETS PO QD FOR 3 DAYS THEN TAKE 2 TABLETS PO QD FOR 3 DAYS THEN TAKE 1 TABLET PO QD FOR 3 DAYS THEN TAKE 1/2 TAB PO QD FOR 3 DAYS, Disp: 20 tablet, Rfl: 0  Social History   Tobacco Use  Smoking Status Former Smoker  .  Packs/day: 1.50  . Years: 33.00  . Pack years: 49.50  . Types: Cigarettes  . Start date: 12/08/1964  . Quit date: 08/21/1998  . Years since quitting: 21.6  Smokeless Tobacco Never Used  Tobacco Comment   quit 16 years ago    Allergies  Allergen Reactions  . Fluoxetine Other (See Comments)    seizure  . Lamotrigine Other (See Comments) and Itching    itch itch  . Sulfa Antibiotics Other (See Comments) and Shortness Of Breath    Dyspnea  . Statins Other (See Comments)    Muscle pains Muscle pains Muscle pains Muscle pain  . Fluoxetine Hcl Other (See Comments)    seizure  . Sulfa Drugs Cross Reactors     Dyspnea    Objective:  There were no vitals filed for this visit. There is no height or weight on file to calculate BMI. Constitutional Well developed. Well nourished.  Vascular Dorsalis pedis pulses palpable bilaterally. Posterior tibial pulses palpable bilaterally. Capillary refill normal to all digits.  No cyanosis or clubbing noted. Pedal hair growth normal.  Neurologic Normal speech. Oriented to person, place, and time. Epicritic sensation to light touch grossly present bilaterally.  Dermatologic  hyperkeratotic lesion noted in the interspace digital space of fourth and fifth digit.  There is also some capsulitis noted of the fifth digit at the PIPJ especially with range of motion of the fifth digit.  Orthopedic: Normal joint ROM without pain or crepitus bilaterally. No visible deformities. No bony tenderness.   Radiographs: None Assessment:   1. Controlled type 2 diabetes mellitus without complication, unspecified whether long term insulin use ([place])   2. Benign skin lesion    Plan:  Patient was evaluated and treated and all questions answered.  Bilateral submetatarsal 5 and 1 hyperkeratotic lesion/benign skin lesion -Using a chisel blade and a handle, hyperkeratotic lesions were debrided down aggressively to healthy striated tissue.  No pinpoint bleeding  noted.  No complication noted.  Left fourth digit/fifth digit heloma molle with associated capsulitis -Resolved.  She states that she does not have pain anymore.  No follow-ups on file.

## 2020-04-03 DIAGNOSIS — L821 Other seborrheic keratosis: Secondary | ICD-10-CM | POA: Diagnosis not present

## 2020-04-06 ENCOUNTER — Ambulatory Visit: Payer: Medicare HMO | Admitting: Family Medicine

## 2020-04-13 ENCOUNTER — Ambulatory Visit (INDEPENDENT_AMBULATORY_CARE_PROVIDER_SITE_OTHER): Payer: Medicare HMO | Admitting: Family Medicine

## 2020-04-13 ENCOUNTER — Other Ambulatory Visit: Payer: Self-pay

## 2020-04-13 VITALS — BP 128/77 | HR 98 | Temp 97.6°F | Resp 16 | Ht 63.0 in | Wt 154.0 lb

## 2020-04-13 DIAGNOSIS — J4520 Mild intermittent asthma, uncomplicated: Secondary | ICD-10-CM

## 2020-04-13 DIAGNOSIS — J209 Acute bronchitis, unspecified: Secondary | ICD-10-CM | POA: Diagnosis not present

## 2020-04-13 DIAGNOSIS — J440 Chronic obstructive pulmonary disease with acute lower respiratory infection: Secondary | ICD-10-CM | POA: Diagnosis not present

## 2020-04-13 DIAGNOSIS — K219 Gastro-esophageal reflux disease without esophagitis: Secondary | ICD-10-CM | POA: Diagnosis not present

## 2020-04-13 MED ORDER — ALBUTEROL SULFATE HFA 108 (90 BASE) MCG/ACT IN AERS: INHALATION_SPRAY | RESPIRATORY_TRACT | 5 refills | Status: DC

## 2020-04-13 NOTE — Assessment & Plan Note (Signed)
con't protonix Add pepcid at night F/u GI

## 2020-04-13 NOTE — Progress Notes (Signed)
Patient ID: SHANAYA SCHNECK, female    DOB: 06/13/1946  Age: 74 y.o. MRN: [id number]    Subjective:  Subjective  HPI CHUDNEY SCHEFFLER presents for f/u bronchitis -- -she is feeling 100% better  Pt also c/o inc gerd symptoms  Review of Systems  Constitutional: Negative for appetite change, diaphoresis, fatigue and unexpected weight change.  Eyes: Negative for pain, redness and visual disturbance.  Respiratory: Negative for cough, chest tightness, shortness of breath and wheezing.   Cardiovascular: Negative for chest pain, palpitations and leg swelling.  Endocrine: Negative for cold intolerance, heat intolerance, polydipsia, polyphagia and polyuria.  Genitourinary: Negative for difficulty urinating, dysuria and frequency.  Neurological: Negative for dizziness, light-headedness, numbness and headaches.    History Past Medical History:  Diagnosis Date  . Asthma   . Bipolar disorder ([place])   . Bladder cancer ([place])   . Breast cancer ([place])   . Cancer of lower-inner quadrant of left female breast ([place]) 12/08/2013  . Depression   . Diabetes mellitus   . GERD (gastroesophageal reflux disease)   . Hepatitis B infection   . History of transfusion of whole blood    Approximately 2009, due to breast cancer/chemo.  Marland Kitchen Hyperlipidemia   . Personal history of chemotherapy   . Personal history of radiation therapy     She has a past surgical history that includes Tonsillectomy (1951); Appendectomy (1961); Partial hysterectomy (1991); bladder cancer (2006); Abdominal hysterectomy; and Breast lumpectomy (Left, 2007).   Her family history includes Arthritis in her mother; Breast cancer in her mother; Cancer in her father; Diabetes in her father; Heart disease in her father; Hypertension in her mother; Throat cancer in her father; Transient ischemic attack in her mother.She reports that she quit smoking about 21 years ago. Her smoking use included cigarettes. She started smoking about 55 years ago. She has a  49.50 pack-year smoking history. She has never used smokeless tobacco. She reports current alcohol use. She reports that she does not use drugs.  Current Outpatient Medications on File Prior to Visit  Medication Sig Dispense Refill  . alendronate (FOSAMAX) 70 MG tablet TAKE 1 TABLET BY MOUTH ONCE A WEEK WITH  A  FULL  GLASS  OF  WATER  ON  AN  EMPTY  STOMACH 12 tablet 1  . aspirin 81 MG tablet Take 81 mg by mouth daily.    Marland Kitchen b complex vitamins tablet Take 1 tablet by mouth daily.    . Blood Glucose Monitoring Suppl (TRUE METRIX AIR GLUCOSE METER) DEVI 1 Device by Does not apply route daily. 1 each 0  . budesonide-formoterol (SYMBICORT) 160-4.5 MCG/ACT inhaler Inhale 2 puffs into the lungs 2 (two) times daily. 3 Inhaler 3  . Calcium Carb-Cholecalciferol (CALCIUM 1000 + D PO) Take by mouth.    . Cholecalciferol (VITAMIN D) 2000 units CAPS Take 5,000 Units by mouth daily.     . citalopram (CELEXA) 10 MG tablet Take 0.5 tablets (5 mg total) by mouth at bedtime. 45 tablet 1  . ezetimibe (ZETIA) 10 MG tablet TAKE 1 TABLET BY MOUTH DAILY. 90 tablet 1  . LORazepam (ATIVAN) 0.5 MG tablet Take 1 tablet (0.5 mg total) by mouth 2 (two) times daily as needed. 90 tablet 0  . losartan (COZAAR) 50 MG tablet TAKE 1 TABLET EVERY DAY 90 tablet 1  . metFORMIN (GLUCOPHAGE) 1000 MG tablet TAKE 1 TABLET TWO TIMES DAILY WITH A MEAL. 180 tablet 1  . Multiple Vitamin (MULTIVITAMIN) tablet Take 1 tablet  by mouth daily.    . Multiple Vitamins-Minerals (HAIR SKIN AND NAILS FORMULA) TABS Take 1 tablet by mouth daily. 2,500 mg daily    . OLANZapine (ZYPREXA) 5 MG tablet 1/2 tablet daily 30 tablet 0  . pantoprazole (PROTONIX) 40 MG tablet TAKE 1 TABLET EVERY DAY 90 tablet 1  . rosuvastatin (CRESTOR) 5 MG tablet Take 1 tablet (5 mg total) by mouth 3 (three) times a week. 90 tablet 5  . SHINGRIX injection     . Spacer/Aero Chamber Mouthpiece MISC To use with inhaler 1 each 1  . TRUE METRIX BLOOD GLUCOSE TEST test strip CHECK  BLOOD SUGAR ONE TIME DAILY 100 strip 0  . TRUEplus Lancets 33G MISC TEST ONE TIME DAILY AS DIRECTED 100 each 1   No current facility-administered medications on file prior to visit.     Objective:  Objective  Physical Exam Vitals and nursing note reviewed.  Constitutional:      Appearance: She is well-developed.  HENT:     Head: Normocephalic and atraumatic.  Eyes:     Conjunctiva/sclera: Conjunctivae normal.  Neck:     Thyroid: No thyromegaly.     Vascular: No carotid bruit or JVD.  Cardiovascular:     Rate and Rhythm: Normal rate and regular rhythm.     Heart sounds: Normal heart sounds. No murmur heard.   Pulmonary:     Effort: Pulmonary effort is normal. No respiratory distress.     Breath sounds: Normal breath sounds. No wheezing or rales.  Chest:     Chest wall: No tenderness.  Musculoskeletal:     Cervical back: Normal range of motion and neck supple.  Neurological:     Mental Status: She is alert and oriented to person, place, and time.    BP 128/77 (BP Location: Right Arm, Patient Position: Sitting, Cuff Size: Small)   Pulse 98   Temp 97.6 F (36.4 C) (Temporal)   Resp 16   Ht 5\' 3"  (1.6 m)   Wt 154 lb (69.9 kg)   SpO2 95%   BMI 27.28 kg/m  Wt Readings from Last 3 Encounters:  04/13/20 154 lb (69.9 kg)  03/23/20 151 lb 12.8 oz (68.9 kg)  03/19/20 150 lb (68 kg)     Lab Results  Component Value Date   WBC 5.7 03/19/2020   HGB 15.0 03/19/2020   HCT 45.5 03/19/2020   PLT 160 03/19/2020   GLUCOSE 133 (H) 03/19/2020   CHOL 153 01/10/2020   TRIG 167.0 (H) 01/10/2020   HDL 50.10 01/10/2020   LDLDIRECT 145.6 06/22/2013   LDLCALC 69 01/10/2020   ALT 56 (H) 03/19/2020   AST 57 (H) 03/19/2020   NA 137 03/19/2020   K 4.3 03/19/2020   CL 99 03/19/2020   CREATININE 0.92 03/19/2020   BUN 13 03/19/2020   CO2 25 03/19/2020   TSH 0.80 01/30/2015   INR 0.97 09/25/2011   HGBA1C 6.8 (H) 01/10/2020   MICROALBUR 3.6 (H) 01/10/2020    CT Angio Chest PE W  and/or Wo Contrast  Result Date: 03/19/2020 CLINICAL DATA:  74 year old female with acute shortness of breath and cough. EXAM: CT ANGIOGRAPHY CHEST WITH CONTRAST TECHNIQUE: Multidetector CT imaging of the chest was performed using the standard protocol during bolus administration of intravenous contrast. Multiplanar CT image reconstructions and MIPs were obtained to evaluate the vascular anatomy. CONTRAST:  67mL OMNIPAQUE IOHEXOL 350 MG/ML SOLN COMPARISON:  10/10/2018 chest radiograph FINDINGS: Cardiovascular: This is a technically satisfactory study for evaluation of  pulmonary emboli but some motion artifact in the LOWER lungs decreases sensitivity. No pulmonary emboli are identified. Coronary artery and aortic atherosclerotic calcifications are present. No thoracic aortic aneurysm or pericardial effusion identified. Heart size is normal. Mediastinum/Nodes: No enlarged mediastinal, hilar, or axillary lymph nodes. Thyroid gland, trachea, and esophagus demonstrate no significant findings. Lungs/Pleura: Lungs are clear. No pleural effusion or pneumothorax. Upper Abdomen: No acute abnormality. Musculoskeletal: No chest wall abnormality. No acute or significant osseous findings. Review of the MIP images confirms the above findings. IMPRESSION: 1. No evidence of acute abnormality. No evidence of pulmonary emboli. 2. Coronary artery disease. 3. Aortic Atherosclerosis (ICD10-I70.0). Electronically Signed   By: Margarette Canada M.D.   On: 03/19/2020 15:59   DG Chest Port 1 View  Result Date: 03/19/2020 CLINICAL DATA:  Shortness of breath.  Tachycardia. EXAM: PORTABLE CHEST 1 VIEW COMPARISON:  October 10, 2018 FINDINGS: The heart size and mediastinal contours are within normal limits. Both lungs are clear. The visualized skeletal structures are unremarkable. IMPRESSION: No active disease. Electronically Signed   By: Dorise Bullion III M.D   On: 03/19/2020 14:35     Assessment & Plan:  Plan  I have discontinued Wynona Neat. [place]wall "Keenya Morgan"'s predniSONE and azithromycin. I am also having her maintain her aspirin, Vitamin D, b complex vitamins, LORazepam, Spacer/Aero Chamber Mouthpiece, multivitamin, citalopram, Calcium Carb-Cholecalciferol (CALCIUM 1000 + D PO), True Metrix Blood Glucose Test, TRUEplus Lancets 33G, Shingrix, rosuvastatin, losartan, metFORMIN, pantoprazole, OLANZapine, True Metrix Air Glucose Meter, budesonide-formoterol, Hair Skin and Nails Formula, ezetimibe, alendronate, and albuterol.  Meds ordered this encounter  Medications  . albuterol (VENTOLIN HFA) 108 (90 Base) MCG/ACT inhaler    Sig: INHALE TWO PUFFS INTO LUNGS EVERY 6 HOURS AS NEEDED FOR WHEEZING OR SHORTNESS OF BREATH    Dispense:  18 g    Refill:  5    Please consider 90 day supplies to promote better adherence    Problem List Items Addressed This Visit      Unprioritized   Acute bronchitis with COPD ([place])    Pt is 100% better con't inhalers prn       Relevant Medications   albuterol (VENTOLIN HFA) 108 (90 Base) MCG/ACT inhaler   Asthma   Relevant Medications   albuterol (VENTOLIN HFA) 108 (90 Base) MCG/ACT inhaler   GERD (gastroesophageal reflux disease) - Primary   Relevant Orders   Ambulatory referral to Gastroenterology      Follow-up: Return if symptoms worsen or fail to improve.  Ann Held, DO

## 2020-04-13 NOTE — Patient Instructions (Signed)

## 2020-04-13 NOTE — Assessment & Plan Note (Signed)
Pt is 100% better con't inhalers prn

## 2020-04-20 ENCOUNTER — Other Ambulatory Visit: Payer: Self-pay | Admitting: Family Medicine

## 2020-04-20 DIAGNOSIS — K219 Gastro-esophageal reflux disease without esophagitis: Secondary | ICD-10-CM

## 2020-04-20 DIAGNOSIS — I10 Essential (primary) hypertension: Secondary | ICD-10-CM

## 2020-05-05 ENCOUNTER — Other Ambulatory Visit: Payer: Self-pay | Admitting: Family Medicine

## 2020-05-23 ENCOUNTER — Encounter: Payer: Self-pay | Admitting: Family Medicine

## 2020-05-23 ENCOUNTER — Other Ambulatory Visit: Payer: Self-pay | Admitting: Family Medicine

## 2020-05-23 DIAGNOSIS — Z803 Family history of malignant neoplasm of breast: Secondary | ICD-10-CM

## 2020-05-23 DIAGNOSIS — Z1231 Encounter for screening mammogram for malignant neoplasm of breast: Secondary | ICD-10-CM

## 2020-05-23 NOTE — Telephone Encounter (Signed)
I place an order but ins co may want her to mammogram first anyway

## 2020-05-24 DIAGNOSIS — D1801 Hemangioma of skin and subcutaneous tissue: Secondary | ICD-10-CM | POA: Diagnosis not present

## 2020-05-24 DIAGNOSIS — X32XXXS Exposure to sunlight, sequela: Secondary | ICD-10-CM | POA: Diagnosis not present

## 2020-05-24 DIAGNOSIS — L821 Other seborrheic keratosis: Secondary | ICD-10-CM | POA: Diagnosis not present

## 2020-05-24 DIAGNOSIS — L814 Other melanin hyperpigmentation: Secondary | ICD-10-CM | POA: Diagnosis not present

## 2020-05-24 DIAGNOSIS — D692 Other nonthrombocytopenic purpura: Secondary | ICD-10-CM | POA: Diagnosis not present

## 2020-05-30 ENCOUNTER — Other Ambulatory Visit: Payer: Self-pay | Admitting: Family Medicine

## 2020-05-30 DIAGNOSIS — Z803 Family history of malignant neoplasm of breast: Secondary | ICD-10-CM

## 2020-06-21 ENCOUNTER — Ambulatory Visit
Admission: RE | Admit: 2020-06-21 | Discharge: 2020-06-21 | Disposition: A | Discharge status: Home / Self Care | Payer: Medicare HMO | Source: Ambulatory Visit | Attending: Family Medicine | Admitting: Family Medicine

## 2020-06-21 ENCOUNTER — Other Ambulatory Visit: Payer: Self-pay | Admitting: Family Medicine

## 2020-06-21 ENCOUNTER — Other Ambulatory Visit: Payer: Self-pay

## 2020-06-21 DIAGNOSIS — Z803 Family history of malignant neoplasm of breast: Secondary | ICD-10-CM

## 2020-06-21 DIAGNOSIS — Z853 Personal history of malignant neoplasm of breast: Secondary | ICD-10-CM | POA: Diagnosis not present

## 2020-06-21 IMAGING — MR MR BREAST BILAT WO/W CM
8 of 12 series · 32 of 48 positions shown · IV contrast (7 ml gadavist)
Comparison: None.  Correlation made with prior mammograms.

CLINICAL DATA: 74-year-old female for high risk screening MRI due
to a personal history of left breast cancer in [DATE] status
post lumpectomy, radiation and chemotherapy. She also has history of
bladder cancer.

LABS:  Creatinine of 0.92 mg/dL [DATE] and GFR of greater than
60 on [DATE].
EXAM:
BILATERAL BREAST MRI WITH AND WITHOUT CONTRAST
TECHNIQUE: Multiplanar, multisequence MR images of both breasts were obtained
prior to and following the intravenous administration of 7 ml of
Gadavist

[Series 2: t2_tirm_tra ipat (a-p) · axial · 3.0mm · 0.70mm/px · 1 of 55 slices shown]
[im 1/55]
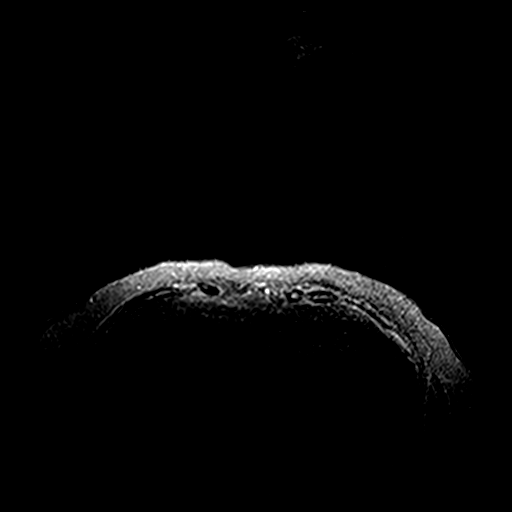

[Series 3: fl3d pre-cm no · axial · non-contrast · 1.2mm · 0.94mm/px · z∈[-79,+92]mm · 5 of 144 slices shown]
[im 1/144]
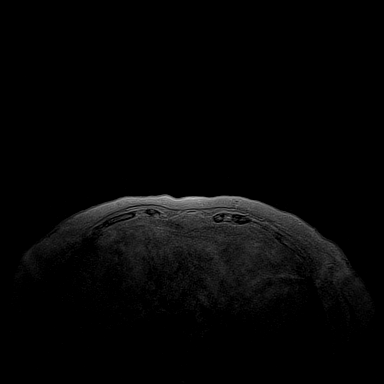
[im 36/144]
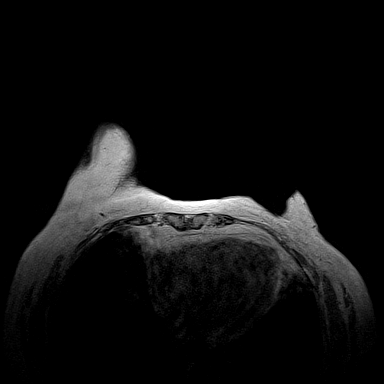
[im 72/144]
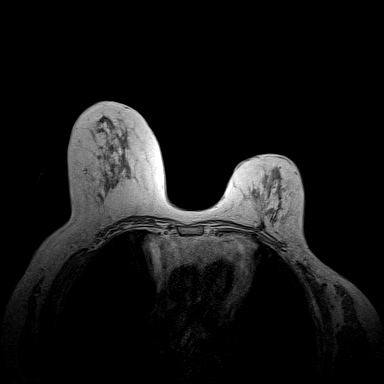
[im 108/144]
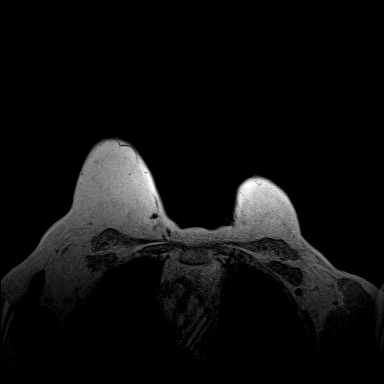
[im 144/144]
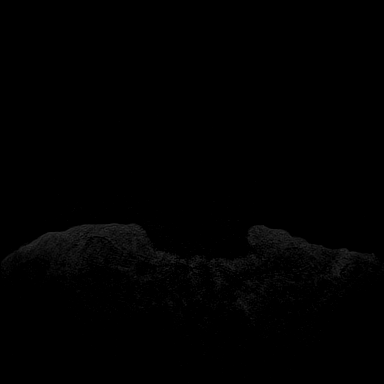

[Series 4: fl3d pre-cm · axial · non-contrast · 1.2mm · 0.94mm/px · z∈[-79,+92]mm · 5 of 144 slices shown]
[im 1/144]
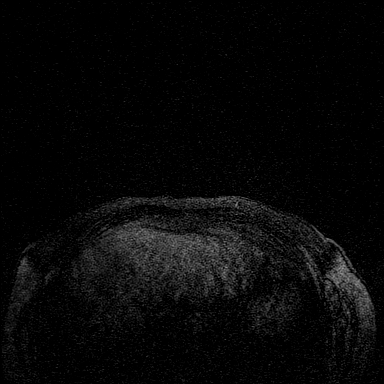
[im 36/144]
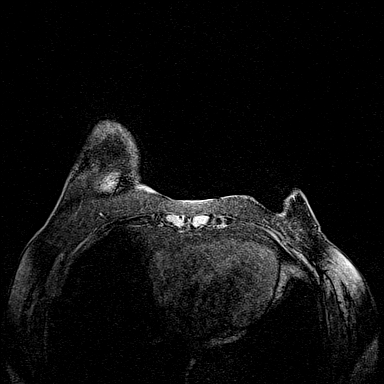
[im 72/144]
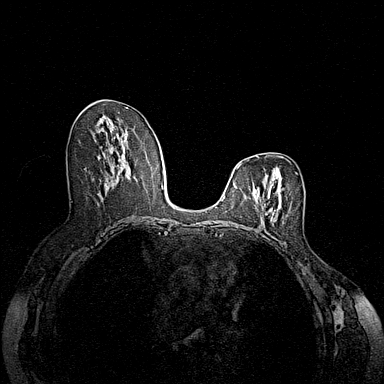
[im 108/144]
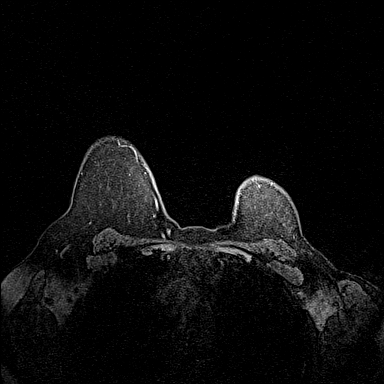
[im 144/144]
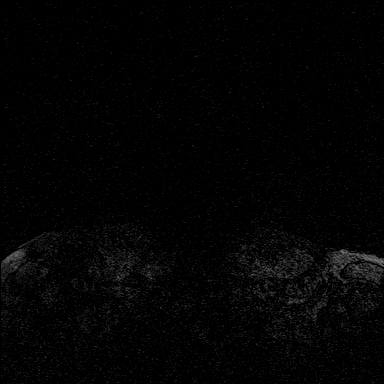

[Series 5: fl3d post-cm 20 · axial · 1.2mm · 0.94mm/px · z∈[-79,+92]mm · 5 of 144 slices shown (1 of 3)]
[im 1/144]
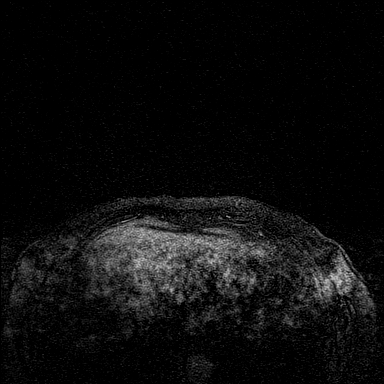
[im 36/144]
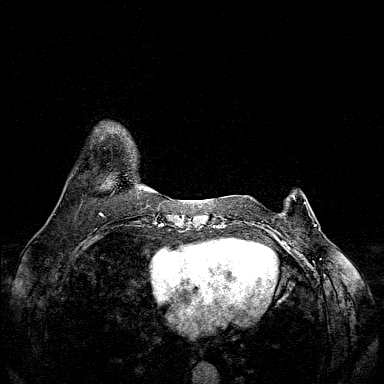
[im 72/144]
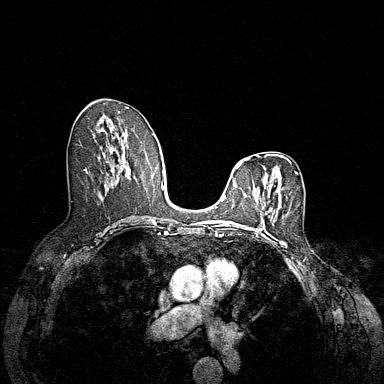
[im 108/144]
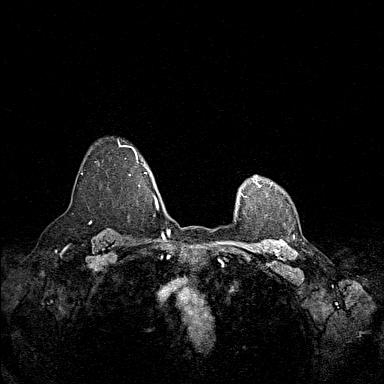
[im 144/144]
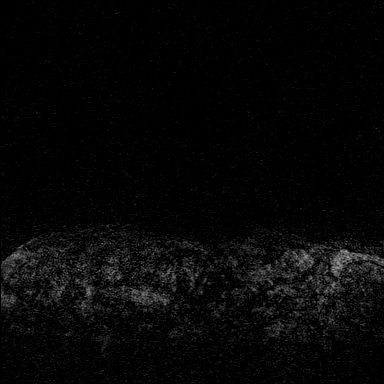

[Series 6: fl3d post-cm 20 · axial · 1.2mm · 0.94mm/px · z∈[-79,+92]mm · 5 of 144 slices shown (2 of 3)]
[im 1/144]
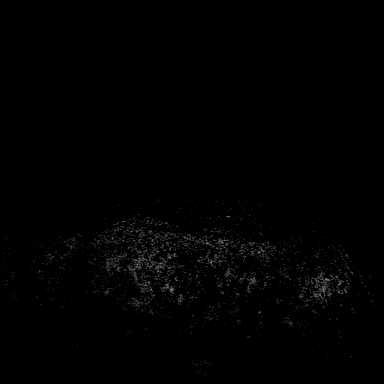
[im 36/144]
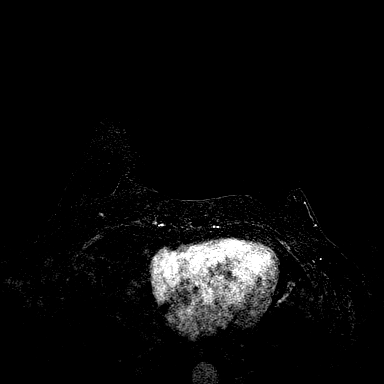
[im 72/144]
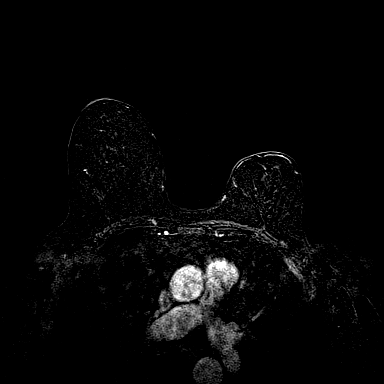
[im 108/144]
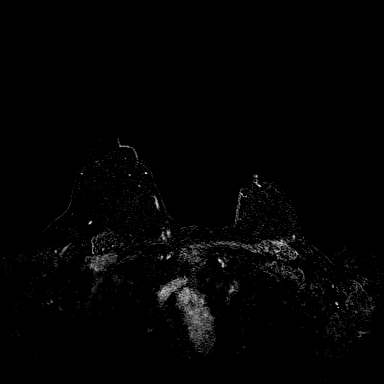
[im 144/144]
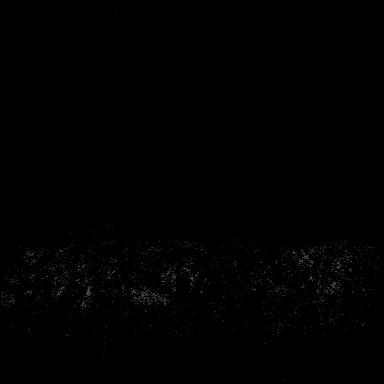

[Series 7: fl3d post-cm 20 · axial · 172.8mm · 0.94mm/px · 1 of 1 slices shown (3 of 3)]
[im 1/1]
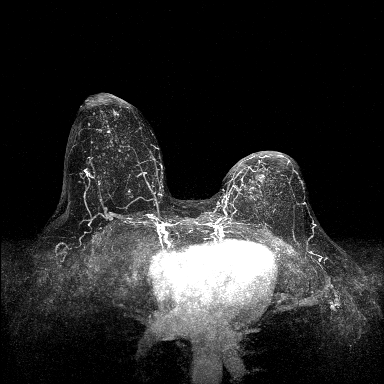

[Series 8: fl3d post-cm 3min · axial · 1.2mm · 0.94mm/px · z∈[-79,+92]mm · 6 of 144 slices shown]
[im 1/144]
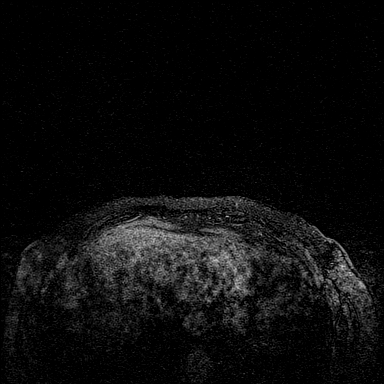
[im 29/144]
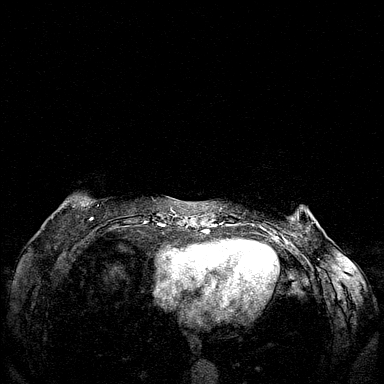
[im 58/144]
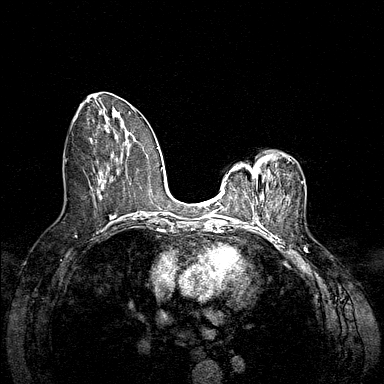
[im 86/144]
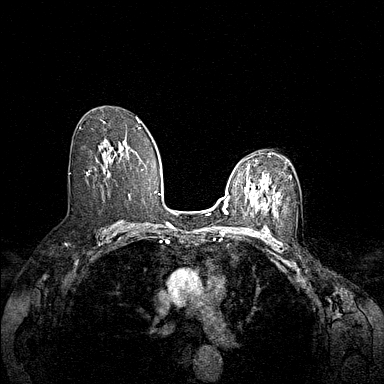
[im 115/144]
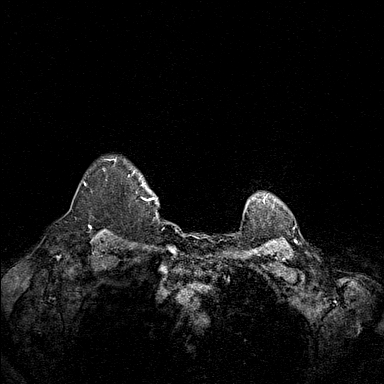
[im 144/144]
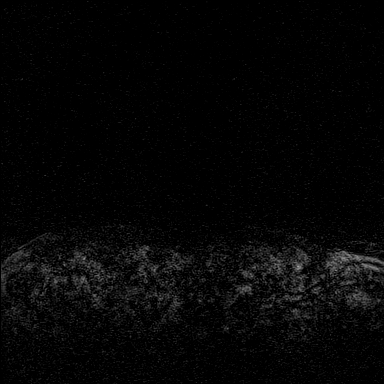

[Series 9: fl3d post-cm 3min_sub · axial · 1.2mm · 0.94mm/px · z∈[-79,+23]mm · 4 of 144 slices shown]
[im 1/144]
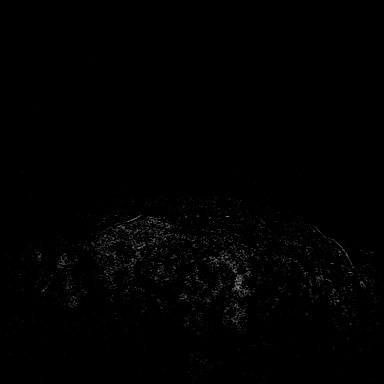
[im 29/144]
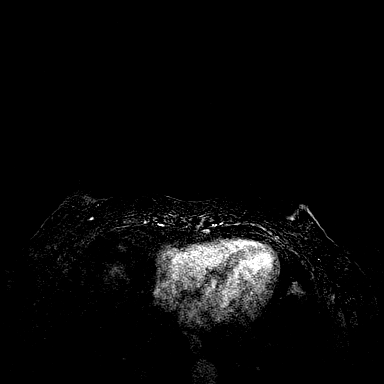
[im 58/144]
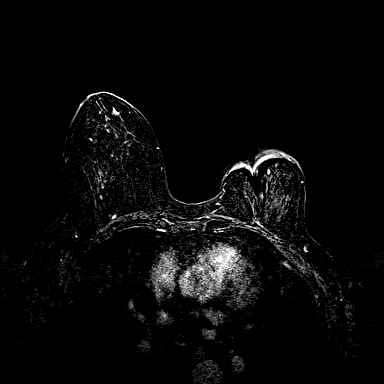
[im 86/144]
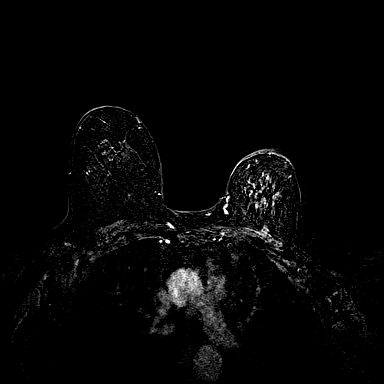

[32 of 48 positions shown; findings below may reference images not displayed]

Three-dimensional MR images were rendered by post-processing of the
original MR data on an independent workstation. The
three-dimensional MR images were interpreted, and findings are
reported in the following complete MRI report for this study. Three
dimensional images were evaluated at the independent interpreting
workstation using the DynaCAD thin client.
FINDINGS: Breast composition: c. Heterogeneous fibroglandular tissue.

Background parenchymal enhancement: Mild.

Right breast: No mass or abnormal enhancement. Scattered foci of
background parenchymal enhancement noted.

Left breast: Surgical changes in the lower-inner quadrant of the
left breast consistent with history of lumpectomy. There is
enhancement in, and just deep to the left nipple (series 6, image
94), which appears asymmetric to the contralateral nipple, though
this could be due to the difference in the contour of the breast and
plane of the nipple as compared to the right.

Lymph nodes: No abnormal appearing lymph nodes.

Ancillary findings:  None.
IMPRESSION: 1. Mild, possibly asymmetric enhancement in and just deep to the
left nipple.

2.  No MRI evidence right breast malignancy.

RECOMMENDATION:
1. Physical exam of the left nipple and targeted retroareolar
ultrasound is recommended for further evaluation of the possible
asymmetric enhancement. If both exam and ultrasound are negative,
six-month follow-up MRI is recommended for monitoring. If there is
any clinical concern taking into consideration physical exam and
sonographic imaging findings, nipple biopsy is suggested.

BI-RADS CATEGORY  3: Probably benign.

## 2020-06-21 MED ORDER — GADOBUTROL 1 MMOL/ML IV SOLN
7.0000 mL | Freq: Once | INTRAVENOUS | Status: AC | PRN
  Administered 2020-06-21: 7 mL via INTRAVENOUS

## 2020-06-23 ENCOUNTER — Telehealth: Payer: Self-pay | Admitting: Family Medicine

## 2020-06-23 DIAGNOSIS — E1165 Type 2 diabetes mellitus with hyperglycemia: Secondary | ICD-10-CM

## 2020-06-23 MED ORDER — TRUE METRIX METER W/DEVICE KIT: PACK | 0 refills | Status: DC

## 2020-06-23 NOTE — Telephone Encounter (Signed)
Pharmacy states patient needs RX for True Metrix glucometer.

## 2020-06-23 NOTE — Telephone Encounter (Signed)
Rx sent to the pharmacy.

## 2020-07-03 ENCOUNTER — Ambulatory Visit
Admission: RE | Admit: 2020-07-03 | Discharge: 2020-07-03 | Disposition: A | Discharge status: Home / Self Care | Payer: Medicare HMO | Source: Ambulatory Visit | Attending: Family Medicine | Admitting: Family Medicine

## 2020-07-03 ENCOUNTER — Other Ambulatory Visit: Payer: Self-pay

## 2020-07-03 DIAGNOSIS — Z1231 Encounter for screening mammogram for malignant neoplasm of breast: Secondary | ICD-10-CM

## 2020-07-03 IMAGING — MG DIGITAL SCREENING BILAT W/ TOMO W/ CAD
6 of 10 series · 6 of 30 positions shown · non-contrast
Comparison: Previous exam(s).

CLINICAL DATA: Screening. History of left lumpectomy [7E].

EXAM:
DIGITAL SCREENING BILATERAL MAMMOGRAM WITH TOMO AND CAD

[L MLO synth-2D]
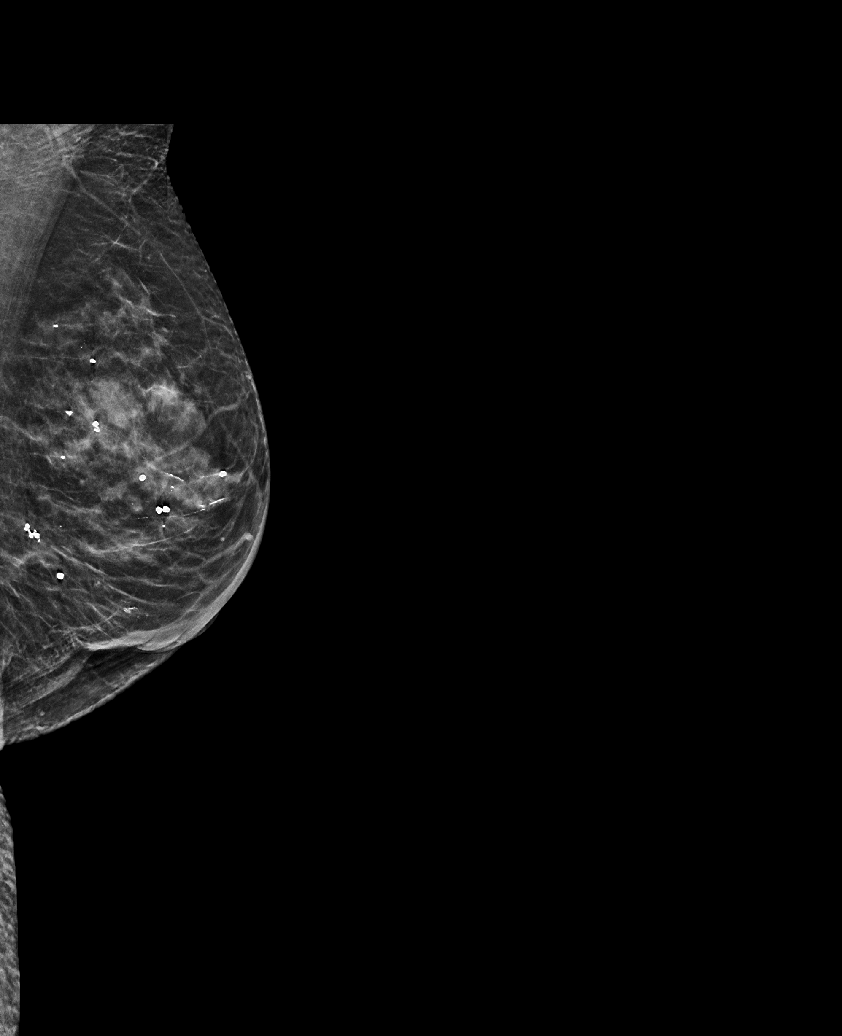

[L CC synth-2D]
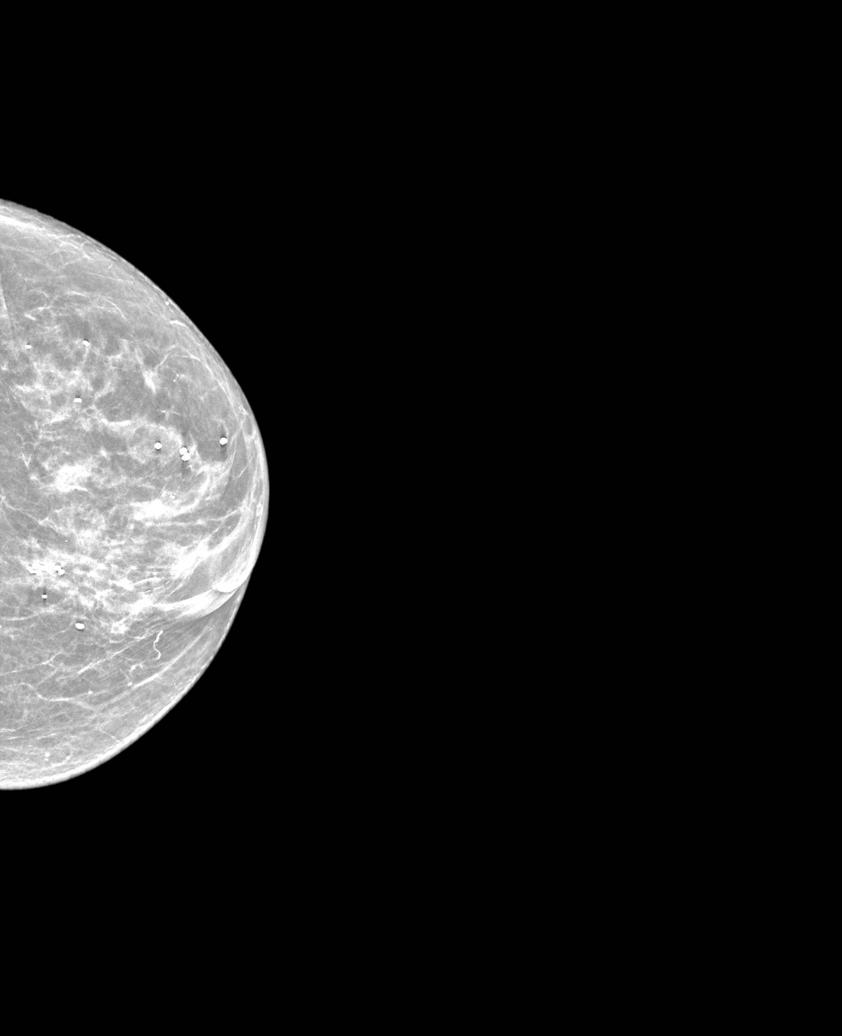

[R CC synth-2D]
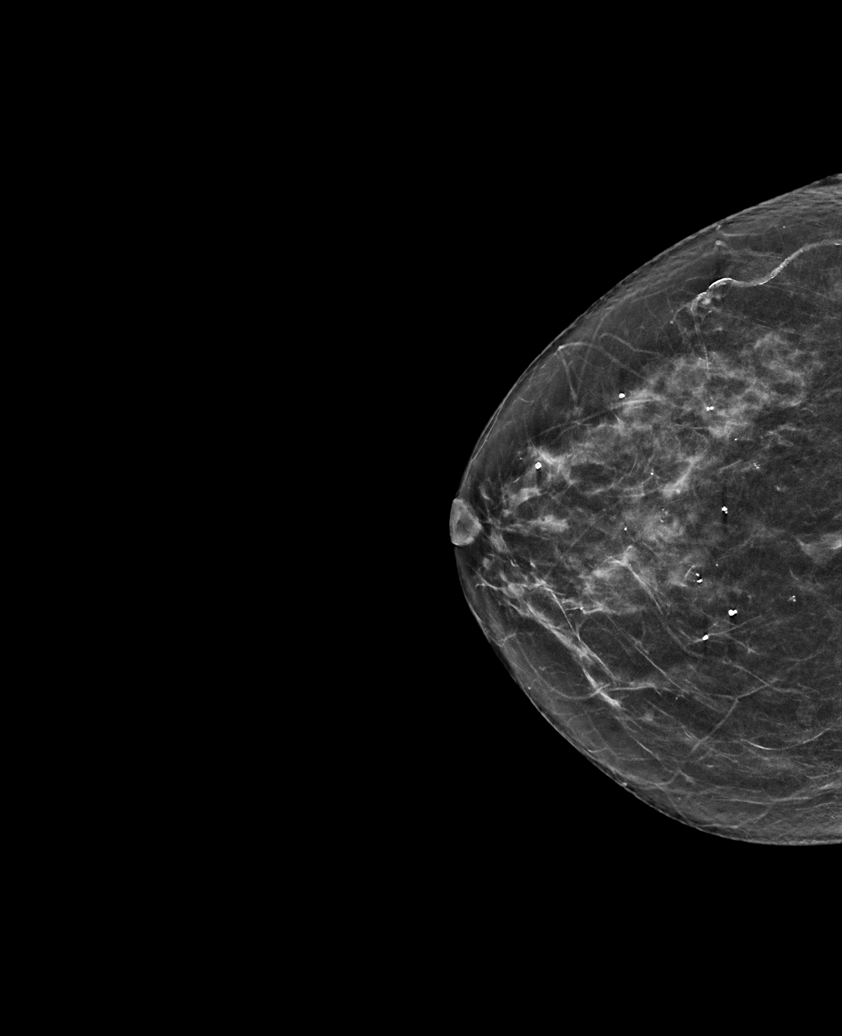

[R MLO synth-2D (1 of 2)]
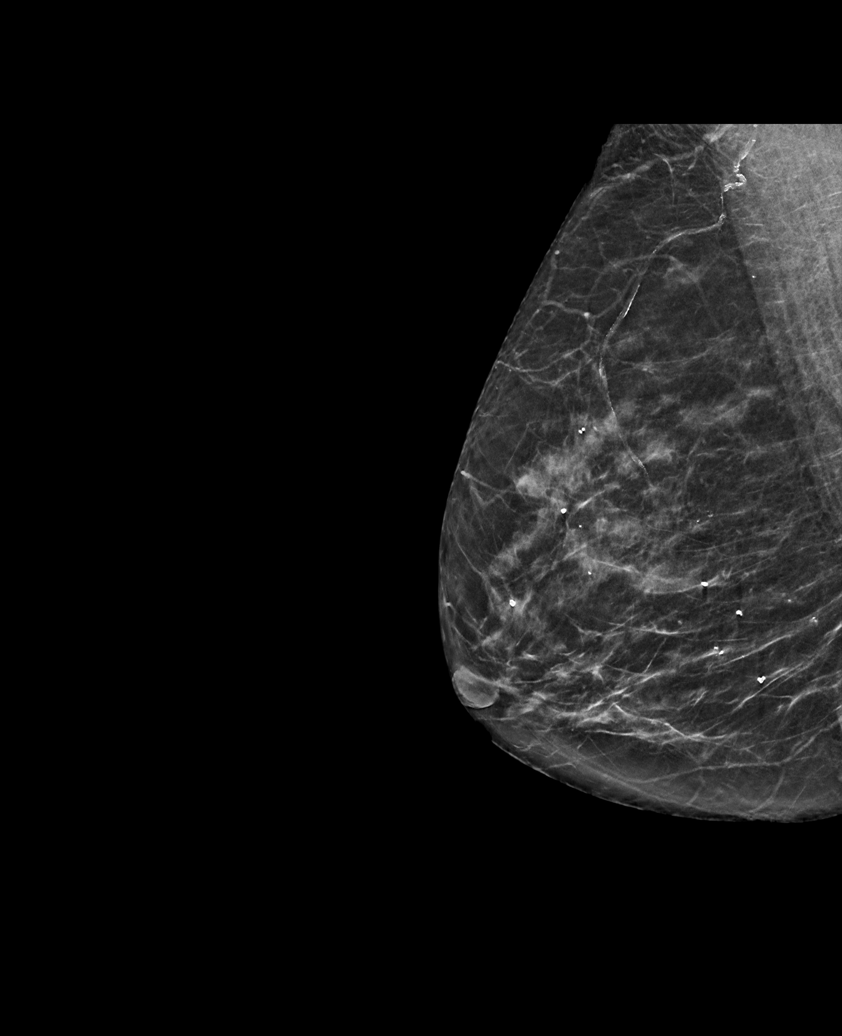

[R MLO synth-2D (2 of 2)]
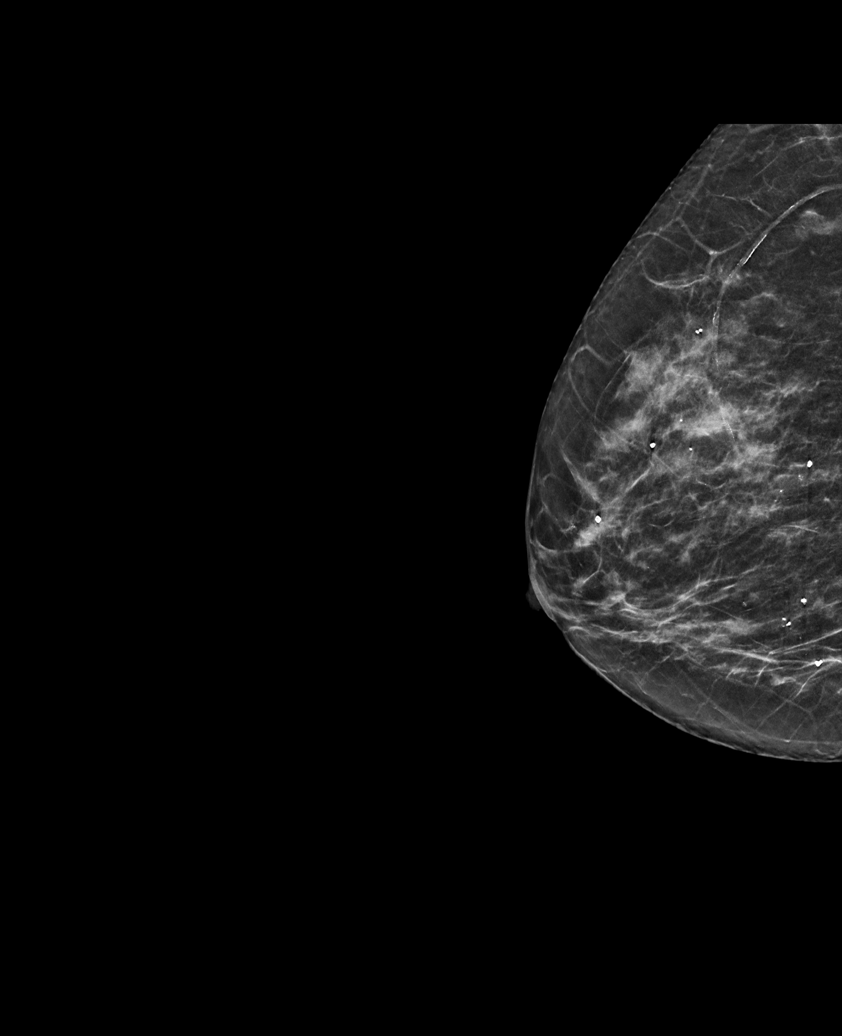

[R CC tomo · tomo slice 29/58.0]
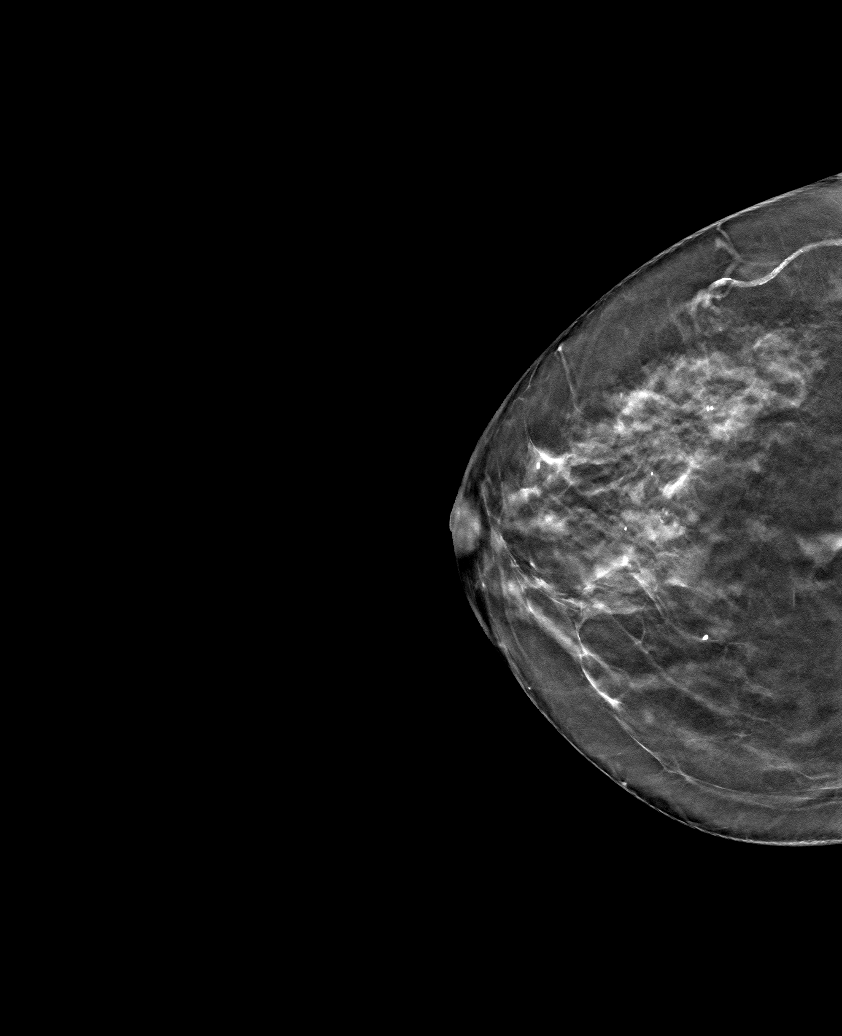

[6 of 30 positions shown; findings below may reference images not displayed]

ACR Breast Density Category c: The breast tissue is heterogeneously
dense, which may obscure small masses.
FINDINGS: There are no findings suspicious for malignancy. Images were
processed with CAD.
IMPRESSION: No mammographic evidence of malignancy. A result letter of this
screening mammogram will be mailed directly to the patient.

RECOMMENDATION:
Screening mammogram in one year. (Code:[7E])

BI-RADS CATEGORY  1: Negative.

## 2020-07-14 ENCOUNTER — Other Ambulatory Visit: Payer: Self-pay | Admitting: Family Medicine

## 2020-07-14 DIAGNOSIS — R928 Other abnormal and inconclusive findings on diagnostic imaging of breast: Secondary | ICD-10-CM

## 2020-07-17 ENCOUNTER — Ambulatory Visit (INDEPENDENT_AMBULATORY_CARE_PROVIDER_SITE_OTHER): Payer: Medicare HMO | Admitting: Family Medicine

## 2020-07-17 ENCOUNTER — Other Ambulatory Visit: Payer: Self-pay

## 2020-07-17 ENCOUNTER — Inpatient Hospital Stay (HOSPITAL_BASED_OUTPATIENT_CLINIC_OR_DEPARTMENT_OTHER): Payer: Medicare HMO | Admitting: Hematology & Oncology

## 2020-07-17 ENCOUNTER — Encounter: Payer: Self-pay | Admitting: Family Medicine

## 2020-07-17 ENCOUNTER — Inpatient Hospital Stay: Payer: Medicare HMO | Attending: Hematology & Oncology

## 2020-07-17 ENCOUNTER — Telehealth: Payer: Self-pay | Admitting: Hematology & Oncology

## 2020-07-17 ENCOUNTER — Encounter: Payer: Self-pay | Admitting: Hematology & Oncology

## 2020-07-17 VITALS — BP 110/80 | HR 101 | Temp 98.1°F | Resp 18 | Ht 63.0 in | Wt 152.4 lb

## 2020-07-17 VITALS — BP 128/62 | HR 99 | Temp 98.5°F | Resp 18 | Wt 152.2 lb

## 2020-07-17 DIAGNOSIS — E1169 Type 2 diabetes mellitus with other specified complication: Secondary | ICD-10-CM | POA: Diagnosis not present

## 2020-07-17 DIAGNOSIS — C50312 Malignant neoplasm of lower-inner quadrant of left female breast: Secondary | ICD-10-CM | POA: Insufficient documentation

## 2020-07-17 DIAGNOSIS — Z888 Allergy status to other drugs, medicaments and biological substances status: Secondary | ICD-10-CM | POA: Diagnosis not present

## 2020-07-17 DIAGNOSIS — Z79899 Other long term (current) drug therapy: Secondary | ICD-10-CM | POA: Insufficient documentation

## 2020-07-17 DIAGNOSIS — E1165 Type 2 diabetes mellitus with hyperglycemia: Secondary | ICD-10-CM

## 2020-07-17 DIAGNOSIS — Z803 Family history of malignant neoplasm of breast: Secondary | ICD-10-CM | POA: Diagnosis not present

## 2020-07-17 DIAGNOSIS — Z23 Encounter for immunization: Secondary | ICD-10-CM | POA: Diagnosis not present

## 2020-07-17 DIAGNOSIS — E559 Vitamin D deficiency, unspecified: Secondary | ICD-10-CM

## 2020-07-17 DIAGNOSIS — Z882 Allergy status to sulfonamides status: Secondary | ICD-10-CM | POA: Diagnosis not present

## 2020-07-17 DIAGNOSIS — I10 Essential (primary) hypertension: Secondary | ICD-10-CM | POA: Diagnosis not present

## 2020-07-17 DIAGNOSIS — E2839 Other primary ovarian failure: Secondary | ICD-10-CM

## 2020-07-17 DIAGNOSIS — E785 Hyperlipidemia, unspecified: Secondary | ICD-10-CM

## 2020-07-17 LAB — CMP (CANCER CENTER ONLY)
ALT: 30 U/L (ref 0–44)
AST: 29 U/L (ref 15–41)
Albumin: 4.5 g/dL (ref 3.5–5.0)
Alkaline Phosphatase: 35 U/L — ABNORMAL LOW (ref 38–126)
Anion gap: 10 (ref 5–15)
BUN: 9 mg/dL (ref 8–23)
CO2: 30 mmol/L (ref 22–32)
Calcium: 10.5 mg/dL — ABNORMAL HIGH (ref 8.9–10.3)
Chloride: 102 mmol/L (ref 98–111)
Creatinine: 0.74 mg/dL (ref 0.44–1.00)
GFR, Est AFR Am: >60 mL/min (ref >60)
GFR, Estimated: >60 mL/min (ref >60)
Glucose, Bld: 119 mg/dL — ABNORMAL HIGH (ref 70–99)
Potassium: 4.3 mmol/L (ref 3.5–5.1)
Sodium: 142 mmol/L (ref 135–145)
Total Bilirubin: 0.4 mg/dL (ref 0.3–1.2)
Total Protein: 7.1 g/dL (ref 6.5–8.1)

## 2020-07-17 LAB — CBC WITH DIFFERENTIAL (CANCER CENTER ONLY)
Abs Immature Granulocytes: 0.03 10*3/uL (ref 0.00–0.07)
Basophils Absolute: 0.1 10*3/uL (ref 0.0–0.1)
Basophils Relative: 1 %
Eosinophils Absolute: 0.3 10*3/uL (ref 0.0–0.5)
Eosinophils Relative: 3 %
HCT: 44 % (ref 36.0–46.0)
Hemoglobin: 13.9 g/dL (ref 12.0–15.0)
Immature Granulocytes: 0 %
Lymphocytes Relative: 19 %
Lymphs Abs: 1.4 10*3/uL (ref 0.7–4.0)
MCH: 29.5 pg (ref 26.0–34.0)
MCHC: 31.6 g/dL (ref 30.0–36.0)
MCV: 93.4 fL (ref 80.0–100.0)
Monocytes Absolute: 0.7 10*3/uL (ref 0.1–1.0)
Monocytes Relative: 10 %
Neutro Abs: 5.1 10*3/uL (ref 1.7–7.7)
Neutrophils Relative %: 67 %
Platelet Count: 193 10*3/uL (ref 150–400)
RBC: 4.71 MIL/uL (ref 3.87–5.11)
RDW: 12.6 % (ref 11.5–15.5)
WBC Count: 7.6 10*3/uL (ref 4.0–10.5)
nRBC: 0 % (ref 0.0–0.2)

## 2020-07-17 LAB — VITAMIN D 25 HYDROXY (VIT D DEFICIENCY, FRACTURES): Vit D, 25-Hydroxy: 73.47 ng/mL (ref 30–100)

## 2020-07-17 NOTE — Progress Notes (Signed)
Hematology and Oncology Follow Up Visit  Michelle Long [id number] 04-21-1946 74 y.o. 07/17/2020   Principle Diagnosis:  Stage IIA (T2 N0M0) infiltrating ductal carcinoma of the left breast  Current Therapy:   Tamoxifen 20 mg by mouth daily - finished in September 2017    Interim History:  [name] is here for follow-up. We see her every 6 months.  She is doing quite well.  She has had a good 6 months.  She was down in [place] with one of her daughters and granddaughters.  She had a really good time down there.  She has been trying to walk.  She does go shopping.  She has had no problems with cough.  She has had no problems with nausea or vomiting.  Apparently, there is a strong family history of breast cancer that she found out about.  She had the BRCA testing.  She was negative for this.  She did have an MRI and this also was okay.  There has been no change in bowel or bladder habits.  She has had no rashes.  She has had no headache.  She did have a cataract surgery.   She did have her mammogram back in September 13.  Everything was fine on the mammogram.  The breast MRI was actually done on 06/21/2020.  Overall, her performance status is ECOG 1.   Medications:  Allergies as of 07/17/2020      Reactions   Fluoxetine Other (See Comments)   seizure   Lamotrigine Other (See Comments), Itching   itch itch   Sulfa Antibiotics Other (See Comments), Shortness Of Breath   Dyspnea   Statins Other (See Comments)   Muscle pains Muscle pains Muscle pains Muscle pain   Fluoxetine Hcl Other (See Comments)   seizure   Sulfa Drugs Cross Reactors    Dyspnea      Medication List       Accurate as of July 17, 2020  1:44 PM. If you have any questions, ask your nurse or doctor.        albuterol 108 (90 Base) MCG/ACT inhaler Commonly known as: Ventolin HFA INHALE TWO PUFFS INTO LUNGS EVERY 6 HOURS AS NEEDED FOR WHEEZING OR SHORTNESS OF BREATH   alendronate 70 MG  tablet Commonly known as: FOSAMAX TAKE 1 TABLET BY MOUTH ONCE A WEEK WITH  A  FULL  GLASS  OF  WATER  ON  AN  EMPTY  STOMACH   aspirin 81 MG tablet Take 81 mg by mouth daily.   b complex vitamins tablet Take 1 tablet by mouth daily.   budesonide-formoterol 160-4.5 MCG/ACT inhaler Commonly known as: Symbicort Inhale 2 puffs into the lungs 2 (two) times daily.   CALCIUM 1000 + D PO Take by mouth.   citalopram 10 MG tablet Commonly known as: CELEXA Take 0.5 tablets (5 mg total) by mouth at bedtime.   ezetimibe 10 MG tablet Commonly known as: ZETIA TAKE 1 TABLET BY MOUTH DAILY.   Hair Skin and Nails Formula Tabs Take 1 tablet by mouth daily. 2,500 mg daily   LORazepam 0.5 MG tablet Commonly known as: ATIVAN Take 1 tablet (0.5 mg total) by mouth 2 (two) times daily as needed.   LORazepam 1 MG tablet Commonly known as: ATIVAN   losartan 50 MG tablet Commonly known as: COZAAR TAKE 1 TABLET EVERY DAY   metFORMIN 1000 MG tablet Commonly known as: GLUCOPHAGE TAKE 1 TABLET TWO TIMES DAILY WITH A MEAL.   multivitamin  tablet Take 1 tablet by mouth daily.   OLANZapine 5 MG tablet Commonly known as: ZYPREXA 1/2 tablet daily   pantoprazole 40 MG tablet Commonly known as: PROTONIX TAKE 1 TABLET EVERY DAY   rosuvastatin 5 MG tablet Commonly known as: CRESTOR Take 1 tablet (5 mg total) by mouth 3 (three) times a week.   Shingrix injection Generic drug: Zoster Vaccine Adjuvanted   Spacer/Aero Chamber Mouthpiece Misc To use with inhaler   True Metrix Blood Glucose Test test strip Generic drug: glucose blood TEST BLOOD SUGAR EVERY DAY   True Metrix Meter w/Device Kit Check sugars once daily as directed. E11.9   TRUEplus Lancets 33G Misc TEST ONE TIME DAILY AS DIRECTED   Vitamin D 50 MCG (2000 UT) Caps Take 5,000 Units by mouth daily.       Allergies:  Allergies  Allergen Reactions  . Fluoxetine Other (See Comments)    seizure  . Lamotrigine Other (See  Comments) and Itching    itch itch  . Sulfa Antibiotics Other (See Comments) and Shortness Of Breath    Dyspnea  . Statins Other (See Comments)    Muscle pains Muscle pains Muscle pains Muscle pain  . Fluoxetine Hcl Other (See Comments)    seizure  . Sulfa Drugs Cross Reactors     Dyspnea     Past Medical History, Surgical history, Social history, and Family History were reviewed and updated.  Review of Systems: Review of Systems  Constitutional: Negative.   HENT: Negative.   Eyes: Negative.   Respiratory: Negative.   Cardiovascular: Negative.   Gastrointestinal: Negative.   Genitourinary: Negative.   Musculoskeletal: Negative.   Skin: Negative.   Neurological: Negative.   Endo/Heme/Allergies: Negative.   Psychiatric/Behavioral: Negative.      Physical Exam:  weight is 152 lb 4 oz (69.1 kg). Her oral temperature is 98.5 F (36.9 C). Her blood pressure is 128/62 and her pulse is 99. Her respiration is 18 and oxygen saturation is 97%.   Wt Readings from Last 3 Encounters:  07/17/20 152 lb 4 oz (69.1 kg)  07/17/20 152 lb 6.4 oz (69.1 kg)  04/13/20 154 lb (69.9 kg)    Physical Exam Vitals reviewed.  Constitutional:      Comments: Her breast exam shows her right breast with no masses, edema or erythema. There is no right axillary adenopathy. Left breast is somewhat contracted from surgery and radiation. She has a well-healed lumpectomy scar at the 6:00 position. There is some slight firmness at the lumpectomy site. There is no distinct mass. There is no left axillary adenopathy.  HENT:     Head: Normocephalic and atraumatic.  Eyes:     Pupils: Pupils are equal, round, and reactive to light.  Cardiovascular:     Rate and Rhythm: Normal rate and regular rhythm.     Heart sounds: Normal heart sounds.  Pulmonary:     Effort: Pulmonary effort is normal.     Breath sounds: Normal breath sounds.  Abdominal:     General: Bowel sounds are normal.     Palpations:  Abdomen is soft.  Musculoskeletal:        General: No tenderness or deformity. Normal range of motion.     Cervical back: Normal range of motion.  Lymphadenopathy:     Cervical: No cervical adenopathy.  Skin:    General: Skin is warm and dry.     Findings: No erythema or rash.  Neurological:     Mental Status: She is  alert and oriented to person, place, and time.  Psychiatric:        Behavior: Behavior normal.        Thought Content: Thought content normal.        Judgment: Judgment normal.    athy.   Lab Results  Component Value Date   WBC 7.6 07/17/2020   HGB 13.9 07/17/2020   HCT 44.0 07/17/2020   MCV 93.4 07/17/2020   PLT 193 07/17/2020   No results found for: FERRITIN, IRON, TIBC, UIBC, IRONPCTSAT Lab Results  Component Value Date   RBC 4.71 07/17/2020   No results found for: KPAFRELGTCHN, LAMBDASER, KAPLAMBRATIO No results found for: IGGSERUM, IGA, IGMSERUM No results found for: Odetta Pink, SPEI   Chemistry      Component Value Date/Time   NA 142 07/17/2020 1202   NA 139 07/02/2017 1445   NA 142 01/01/2017 1059   K 4.3 07/17/2020 1202   K 4.6 07/02/2017 1445   K 4.6 01/01/2017 1059   CL 102 07/17/2020 1202   CL 103 07/02/2017 1445   CL 105 01/20/2015 1240   CO2 30 07/17/2020 1202   CO2 28 07/02/2017 1445   CO2 26 01/01/2017 1059   BUN 9 07/17/2020 1202   BUN 10 07/02/2017 1445   BUN 10.6 01/01/2017 1059   CREATININE 0.74 07/17/2020 1202   CREATININE 0.67 07/02/2017 1445   CREATININE 0.8 01/01/2017 1059      Component Value Date/Time   CALCIUM 10.5 (H) 07/17/2020 1202   CALCIUM 10.3 07/02/2017 1445   CALCIUM 10.1 01/01/2017 1059   ALKPHOS 35 (L) 07/17/2020 1202   ALKPHOS 47 07/02/2017 1445   ALKPHOS 49 01/01/2017 1059   AST 29 07/17/2020 1202   AST 60 (H) 01/01/2017 1059   ALT 30 07/17/2020 1202   ALT 63 (H) 01/01/2017 1059   BILITOT 0.4 07/17/2020 1202   BILITOT 0.64 01/01/2017 1059      Impression and Plan: Ms. Brubacher is a most delightful 74 year old postmenopausal white female. She had stage IIA ductal carcinoma the left breast. She completed tamoxifen in September 2017.  I do not see any evidence of recurrent disease.  I would hope that this family history of breast cancer does not put her at high risk for a second breast cancer recurrence.  I would not think that it would.  I do not think she needs to be on any kind of prophylactic agent.  We will continue to follow her along every 6 months.    Volanda Napoleon, MD 9/27/20211:44 PM

## 2020-07-17 NOTE — Progress Notes (Signed)
Patient ID: Michelle Long, female    DOB: April 19, 1946  Age: 74 y.o. MRN: [id number]    Subjective:  Subjective  HPI ANALYSE ANGST presents for f/u dm. ,chol and bp  HYPERTENSION   Blood pressure range-not checking   Chest pain- no      Dyspnea- no Lightheadedness- no   Edema- no  Other side effects - no   Medication compliance: good Low salt diet- yes     DIABETES    Blood Sugar ranges-84-110  Polyuria- no New Visual problems- no  Hypoglycemic symptoms- no  Other side effects-no Medication compliance - good Last eye exam- oct 2020 Foot exam- today    HYPERLIPIDEMIA  Medication compliance- good RUQ pain- no  Muscle aches- no Other side effects-no      Review of Systems  Constitutional: Negative for appetite change, diaphoresis, fatigue and unexpected weight change.  Eyes: Negative for pain, redness and visual disturbance.  Respiratory: Negative for cough, chest tightness, shortness of breath and wheezing.   Cardiovascular: Negative for chest pain, palpitations and leg swelling.  Endocrine: Negative for cold intolerance, heat intolerance, polydipsia, polyphagia and polyuria.  Genitourinary: Negative for difficulty urinating, dysuria and frequency.  Neurological: Negative for dizziness, light-headedness, numbness and headaches.    History Past Medical History:  Diagnosis Date  . Asthma   . Bipolar disorder ([place])   . Bladder cancer ([place])   . Breast cancer ([place])   . Cancer of lower-inner quadrant of left female breast ([place]) 12/08/2013  . Depression   . Diabetes mellitus   . GERD (gastroesophageal reflux disease)   . Hepatitis B infection   . History of transfusion of whole blood    Approximately 2009, due to breast cancer/chemo.  Marland Kitchen Hyperlipidemia   . Personal history of chemotherapy   . Personal history of radiation therapy     She has a past surgical history that includes Tonsillectomy (1951); Appendectomy (1961); Partial hysterectomy (1991); bladder cancer  (2006); Abdominal hysterectomy; and Breast lumpectomy (Left, 2007).   Her family history includes Arthritis in her mother; Breast cancer in her mother; Cancer in her father; Diabetes in her father; Heart disease in her father; Hypertension in her mother; Throat cancer in her father; Transient ischemic attack in her mother.She reports that she quit smoking about 21 years ago. Her smoking use included cigarettes. She started smoking about 55 years ago. She has a 49.50 pack-year smoking history. She has never used smokeless tobacco. She reports current alcohol use. She reports that she does not use drugs.  Current Outpatient Medications on File Prior to Visit  Medication Sig Dispense Refill  . albuterol (VENTOLIN HFA) 108 (90 Base) MCG/ACT inhaler INHALE TWO PUFFS INTO LUNGS EVERY 6 HOURS AS NEEDED FOR WHEEZING OR SHORTNESS OF BREATH (Patient not taking: Reported on 07/17/2020) 18 g 5  . alendronate (FOSAMAX) 70 MG tablet TAKE 1 TABLET BY MOUTH ONCE A WEEK WITH  A  FULL  GLASS  OF  WATER  ON  AN  EMPTY  STOMACH 12 tablet 1  . aspirin 81 MG tablet Take 81 mg by mouth daily.    Marland Kitchen b complex vitamins tablet Take 1 tablet by mouth daily.    . Blood Glucose Monitoring Suppl (TRUE METRIX METER) w/Device KIT Check sugars once daily as directed. E11.9 1 kit 0  . budesonide-formoterol (SYMBICORT) 160-4.5 MCG/ACT inhaler Inhale 2 puffs into the lungs 2 (two) times daily. 3 Inhaler 3  . Calcium Carb-Cholecalciferol (CALCIUM 1000 + D PO) Take  by mouth.    . Cholecalciferol (VITAMIN D) 2000 units CAPS Take 5,000 Units by mouth daily.     . citalopram (CELEXA) 10 MG tablet Take 0.5 tablets (5 mg total) by mouth at bedtime. 45 tablet 1  . ezetimibe (ZETIA) 10 MG tablet TAKE 1 TABLET BY MOUTH DAILY. 90 tablet 1  . LORazepam (ATIVAN) 0.5 MG tablet Take 1 tablet (0.5 mg total) by mouth 2 (two) times daily as needed. (Patient not taking: Reported on 07/17/2020) 90 tablet 0  . losartan (COZAAR) 50 MG tablet TAKE 1 TABLET  EVERY DAY 90 tablet 1  . metFORMIN (GLUCOPHAGE) 1000 MG tablet TAKE 1 TABLET TWO TIMES DAILY WITH A MEAL. 180 tablet 1  . Multiple Vitamin (MULTIVITAMIN) tablet Take 1 tablet by mouth daily.    . Multiple Vitamins-Minerals (HAIR SKIN AND NAILS FORMULA) TABS Take 1 tablet by mouth daily. 2,500 mg daily    . OLANZapine (ZYPREXA) 5 MG tablet 1/2 tablet daily 30 tablet 0  . pantoprazole (PROTONIX) 40 MG tablet TAKE 1 TABLET EVERY DAY 90 tablet 1  . rosuvastatin (CRESTOR) 5 MG tablet Take 1 tablet (5 mg total) by mouth 3 (three) times a week. 90 tablet 5  . SHINGRIX injection     . Spacer/Aero Chamber Mouthpiece MISC To use with inhaler 1 each 1  . TRUE METRIX BLOOD GLUCOSE TEST test strip TEST BLOOD SUGAR EVERY DAY 100 strip 1  . TRUEplus Lancets 33G MISC TEST ONE TIME DAILY AS DIRECTED 100 each 1   No current facility-administered medications on file prior to visit.     Objective:  Objective  Physical Exam Vitals and nursing note reviewed.  Constitutional:      Appearance: She is well-developed.  HENT:     Head: Normocephalic and atraumatic.  Eyes:     Conjunctiva/sclera: Conjunctivae normal.  Neck:     Thyroid: No thyromegaly.     Vascular: No carotid bruit or JVD.  Cardiovascular:     Rate and Rhythm: Normal rate and regular rhythm.     Heart sounds: Normal heart sounds. No murmur heard.   Pulmonary:     Effort: Pulmonary effort is normal. No respiratory distress.     Breath sounds: Normal breath sounds. No wheezing or rales.  Chest:     Chest wall: No tenderness.  Musculoskeletal:     Cervical back: Normal range of motion and neck supple.  Neurological:     Mental Status: She is alert and oriented to person, place, and time.    BP 110/80 (BP Location: Right Arm, Patient Position: Sitting, Cuff Size: Normal)   Pulse (!) 101   Temp 98.1 F (36.7 C) (Oral)   Resp 18   Ht _0  (1.6 m)   Wt 152 lb 6.4 oz (69.1 kg)   SpO2 95%   BMI 27.00 kg/m  Wt Readings from Last 3  Encounters:  07/17/20 152 lb 4 oz (69.1 kg)  07/17/20 152 lb 6.4 oz (69.1 kg)  04/13/20 154 lb (69.9 kg)     Lab Results  Component Value Date   WBC 7.6 07/17/2020   HGB 13.9 07/17/2020   HCT 44.0 07/17/2020   PLT 193 07/17/2020   GLUCOSE 119 (H) 07/17/2020   CHOL 153 01/10/2020   TRIG 167.0 (H) 01/10/2020   HDL 50.10 01/10/2020   LDLDIRECT 145.6 06/22/2013   LDLCALC 69 01/10/2020   ALT 30 07/17/2020   AST 29 07/17/2020   NA 142 07/17/2020   K 4.3  07/17/2020   CL 102 07/17/2020   CREATININE 0.74 07/17/2020   BUN 9 07/17/2020   CO2 30 07/17/2020   TSH 0.80 01/30/2015   INR 0.97 09/25/2011   HGBA1C 6.8 (H) 01/10/2020   MICROALBUR 3.6 (H) 01/10/2020    MM 3D SCREEN BREAST BILATERAL  Result Date: 07/05/2020 CLINICAL DATA:  Screening. History of left lumpectomy 2007. EXAM: DIGITAL SCREENING BILATERAL MAMMOGRAM WITH TOMO AND CAD COMPARISON:  Previous exam(s). ACR Breast Density Category c: The breast tissue is heterogeneously dense, which may obscure small masses. FINDINGS: There are no findings suspicious for malignancy. Images were processed with CAD. IMPRESSION: No mammographic evidence of malignancy. A result letter of this screening mammogram will be mailed directly to the patient. RECOMMENDATION: Screening mammogram in one year. (Code:SM-B-01Y) BI-RADS CATEGORY  1: Negative. Electronically Signed   By: Everlean Alstrom M.D.   On: 07/05/2020 11:44     Assessment & Plan:  Plan  I am having Wynona Neat. Mcinroy maintain her aspirin, Vitamin D, b complex vitamins, LORazepam, Spacer/Aero Chamber Mouthpiece, multivitamin, citalopram, Calcium Carb-Cholecalciferol (CALCIUM 1000 + D PO), TRUEplus Lancets 33G, Shingrix, rosuvastatin, OLANZapine, budesonide-formoterol, Hair Skin and Nails Formula, ezetimibe, alendronate, albuterol, losartan, pantoprazole, metFORMIN, True Metrix Blood Glucose Test, and True Metrix Meter.  No orders of the defined types were placed in this  encounter.   Problem List Items Addressed This Visit      Unprioritized   Essential hypertension    Well controlled, no changes to meds. Encouraged heart healthy diet such as the DASH diet and exercise as tolerated.       Relevant Orders   Lipid panel   Hemoglobin A1c   Comprehensive metabolic panel   Microalbumin / creatinine urine ratio   Estrogen deficiency   Relevant Orders   DG Bone Density   Hyperlipidemia associated with type 2 diabetes mellitus (HCC)    Check labs  Tolerating statin, encouraged heart healthy diet, avoid trans fats, minimize simple carbs and saturated fats. Increase exercise as tolerated      Relevant Orders   Lipid panel   Comprehensive metabolic panel   Uncontrolled type 2 diabetes mellitus with hyperglycemia ([place]) - Primary    hgba1c to be checked , minimize simple carbs. Increase exercise as tolerated. Continue current meds       Relevant Orders   Lipid panel   Hemoglobin A1c   Comprehensive metabolic panel   Microalbumin / creatinine urine ratio    Other Visit Diagnoses    Need for influenza vaccination       Relevant Orders   Flu Vaccine QUAD High Dose(Fluad) (Completed)      Follow-up: Return in about 6 months (around 01/14/2021), or if symptoms worsen or fail to improve, for annual exam, fasting.  Ann Held, DO

## 2020-07-17 NOTE — Telephone Encounter (Signed)
Appointments scheduled patient has My Chart access per 9/27 los

## 2020-07-17 NOTE — Assessment & Plan Note (Signed)
Well controlled, no changes to meds. Encouraged heart healthy diet such as the DASH diet and exercise as tolerated.  °

## 2020-07-17 NOTE — Assessment & Plan Note (Signed)
hgba1c to be checked, minimize simple carbs. Increase exercise as tolerated. Continue current meds  

## 2020-07-17 NOTE — Assessment & Plan Note (Signed)
Check labs  Tolerating statin, encouraged heart healthy diet, avoid trans fats, minimize simple carbs and saturated fats. Increase exercise as tolerated 

## 2020-07-17 NOTE — Patient Instructions (Signed)
Carbohydrate Counting for Diabetes Mellitus, Adult  Carbohydrate counting is a method of keeping track of how many carbohydrates you eat. Eating carbohydrates naturally increases the amount of sugar (glucose) in the blood. Counting how many carbohydrates you eat helps keep your blood glucose within normal limits, which helps you manage your diabetes (diabetes mellitus). It is important to know how many carbohydrates you can safely have in each meal. This is different for every person. A diet and nutrition specialist (registered dietitian) can help you make a meal plan and calculate how many carbohydrates you should have at each meal and snack. Carbohydrates are found in the following foods:  Grains, such as breads and cereals.  Dried beans and soy products.  Starchy vegetables, such as potatoes, peas, and corn.  Fruit and fruit juices.  Milk and yogurt.  Sweets and snack foods, such as cake, cookies, candy, chips, and soft drinks. How do I count carbohydrates? There are two ways to count carbohydrates in food. You can use either of the methods or a combination of both. Reading "Nutrition Facts" on packaged food The "Nutrition Facts" list is included on the labels of almost all packaged foods and beverages in the [place] It includes:  The serving size.  Information about nutrients in each serving, including the grams (g) of carbohydrate per serving. To use the "Nutrition Facts":  Decide how many servings you will have.  Multiply the number of servings by the number of carbohydrates per serving.  The resulting number is the total amount of carbohydrates that you will be having. Learning standard serving sizes of other foods When you eat carbohydrate foods that are not packaged or do not include "Nutrition Facts" on the label, you need to measure the servings in order to count the amount of carbohydrates:  Measure the foods that you will eat with a food scale or measuring cup, if  needed.  Decide how many standard-size servings you will eat.  Multiply the number of servings by 15. Most carbohydrate-rich foods have about 15 g of carbohydrates per serving. ? For example, if you eat 8 oz (170 g) of strawberries, you will have eaten 2 servings and 30 g of carbohydrates (2 servings x 15 g = 30 g).  For foods that have more than one food mixed, such as soups and casseroles, you must count the carbohydrates in each food that is included. The following list contains standard serving sizes of common carbohydrate-rich foods. Each of these servings has about 15 g of carbohydrates:   hamburger bun or  English muffin.   oz (15 mL) syrup.   oz (14 g) jelly.  1 slice of bread.  1 six-inch tortilla.  3 oz (85 g) cooked rice or pasta.  4 oz (113 g) cooked dried beans.  4 oz (113 g) starchy vegetable, such as peas, corn, or potatoes.  4 oz (113 g) hot cereal.  4 oz (113 g) mashed potatoes or  of a large baked potato.  4 oz (113 g) canned or frozen fruit.  4 oz (120 mL) fruit juice.  4-6 crackers.  6 chicken nuggets.  6 oz (170 g) unsweetened dry cereal.  6 oz (170 g) plain fat-free yogurt or yogurt sweetened with artificial sweeteners.  8 oz (240 mL) milk.  8 oz (170 g) fresh fruit or one small piece of fruit.  24 oz (680 g) popped popcorn. Example of carbohydrate counting Sample meal  3 oz (85 g) chicken breast.  6 oz (170 g)   brown rice.  4 oz (113 g) corn.  8 oz (240 mL) milk.  8 oz (170 g) strawberries with sugar-free whipped topping. Carbohydrate calculation 1. Identify the foods that contain carbohydrates: ? Rice. ? Corn. ? Milk. ? Strawberries. 2. Calculate how many servings you have of each food: ? 2 servings rice. ? 1 serving corn. ? 1 serving milk. ? 1 serving strawberries. 3. Multiply each number of servings by 15 g: ? 2 servings rice x 15 g = 30 g. ? 1 serving corn x 15 g = 15 g. ? 1 serving milk x 15 g = 15 g. ? 1  serving strawberries x 15 g = 15 g. 4. Add together all of the amounts to find the total grams of carbohydrates eaten: ? 30 g + 15 g + 15 g + 15 g = 75 g of carbohydrates total. Summary  Carbohydrate counting is a method of keeping track of how many carbohydrates you eat.  Eating carbohydrates naturally increases the amount of sugar (glucose) in the blood.  Counting how many carbohydrates you eat helps keep your blood glucose within normal limits, which helps you manage your diabetes.  A diet and nutrition specialist (registered dietitian) can help you make a meal plan and calculate how many carbohydrates you should have at each meal and snack. This information is not intended to replace advice given to you by your health care provider. Make sure you discuss any questions you have with your health care provider. Document Revised: 05/01/2017 Document Reviewed: 03/20/2016 Elsevier Patient Education  2020 [employer]  

## 2020-07-18 LAB — COMPREHENSIVE METABOLIC PANEL
AG Ratio: 1.9 (calc) (ref 1.0–2.5)
ALT: 30 U/L — ABNORMAL HIGH (ref 6–29)
AST: 28 U/L (ref 10–35)
Albumin: 4.3 g/dL (ref 3.6–5.1)
Alkaline phosphatase (APISO): 37 U/L (ref 37–153)
BUN: 9 mg/dL (ref 7–25)
CO2: 28 mmol/L (ref 20–32)
Calcium: 10.1 mg/dL (ref 8.6–10.4)
Chloride: 102 mmol/L (ref 98–110)
Creat: 0.68 mg/dL (ref 0.60–0.93)
Globulin: 2.3 g/dL (calc) (ref 1.9–3.7)
Glucose, Bld: 113 mg/dL — ABNORMAL HIGH (ref 65–99)
Potassium: 4.6 mmol/L (ref 3.5–5.3)
Sodium: 142 mmol/L (ref 135–146)
Total Bilirubin: 0.3 mg/dL (ref 0.2–1.2)
Total Protein: 6.6 g/dL (ref 6.1–8.1)

## 2020-07-18 LAB — LIPID PANEL
Cholesterol: 141 mg/dL (ref <200)
HDL: 54 mg/dL (ref >=50)
LDL Cholesterol (Calc): 64 mg/dL (calc)
Non-HDL Cholesterol (Calc): 87 mg/dL (calc) (ref <130)
Total CHOL/HDL Ratio: 2.6 (calc) (ref <5.0)
Triglycerides: 151 mg/dL — ABNORMAL HIGH (ref <150)

## 2020-07-18 LAB — MICROALBUMIN / CREATININE URINE RATIO
Creatinine, Urine: 91 mg/dL (ref 20–275)
Microalb Creat Ratio: 24 mcg/mg creat (ref <30)
Microalb, Ur: 2.2 mg/dL

## 2020-07-18 LAB — HEMOGLOBIN A1C
Hgb A1c MFr Bld: 6.6 % of total Hgb — ABNORMAL HIGH (ref <5.7)
Mean Plasma Glucose: 143 (calc)
eAG (mmol/L): 7.9 (calc)

## 2020-07-19 ENCOUNTER — Encounter: Payer: Self-pay | Admitting: Family Medicine

## 2020-07-28 ENCOUNTER — Telehealth: Payer: Self-pay | Admitting: Family Medicine

## 2020-07-28 NOTE — Telephone Encounter (Signed)
error 

## 2020-08-14 ENCOUNTER — Ambulatory Visit
Admission: RE | Admit: 2020-08-14 | Discharge: 2020-08-14 | Disposition: A | Discharge status: Home / Self Care | Payer: Medicare HMO | Source: Ambulatory Visit | Attending: Family Medicine | Admitting: Family Medicine

## 2020-08-14 ENCOUNTER — Other Ambulatory Visit: Payer: Self-pay

## 2020-08-14 DIAGNOSIS — M8589 Other specified disorders of bone density and structure, multiple sites: Secondary | ICD-10-CM | POA: Diagnosis not present

## 2020-08-14 DIAGNOSIS — E2839 Other primary ovarian failure: Secondary | ICD-10-CM

## 2020-08-14 DIAGNOSIS — Z780 Asymptomatic menopausal state: Secondary | ICD-10-CM | POA: Diagnosis not present

## 2020-08-15 DIAGNOSIS — F250 Schizoaffective disorder, bipolar type: Secondary | ICD-10-CM | POA: Diagnosis not present

## 2020-08-28 ENCOUNTER — Other Ambulatory Visit: Payer: Self-pay | Admitting: Family Medicine

## 2020-08-30 ENCOUNTER — Ambulatory Visit
Admission: RE | Admit: 2020-08-30 | Discharge: 2020-08-30 | Disposition: A | Discharge status: Home / Self Care | Payer: Medicare HMO | Source: Ambulatory Visit | Attending: Family Medicine | Admitting: Family Medicine

## 2020-08-30 ENCOUNTER — Other Ambulatory Visit: Payer: Self-pay

## 2020-08-30 DIAGNOSIS — R928 Other abnormal and inconclusive findings on diagnostic imaging of breast: Secondary | ICD-10-CM

## 2020-08-30 DIAGNOSIS — N6002 Solitary cyst of left breast: Secondary | ICD-10-CM | POA: Diagnosis not present

## 2020-08-30 IMAGING — US US BREAST*L* LIMITED INC AXILLA
1 series · 11 of 11 positions shown · non-contrast
Comparison: Previous examinations, including the bilateral breast
MRI dated [DATE] and screening mammogram dated [DATE].

CLINICAL DATA: Mild asymmetrical enhancement within and just deep
to the left nipple on a recent screening MRI of the breasts. Status
post left lumpectomy, radiation therapy and chemotherapy for breast
cancer in [84]. Following the MRI, the patient had [facility] screening
mammogram both breasts that showed no evidence of malignancy.

EXAM:
ULTRASOUND OF THE LEFT BREAST

[Series 1: us breast*left* limited inc axilla · 0.05mm/px · 11 of 11 slices shown]
[im 1/11]
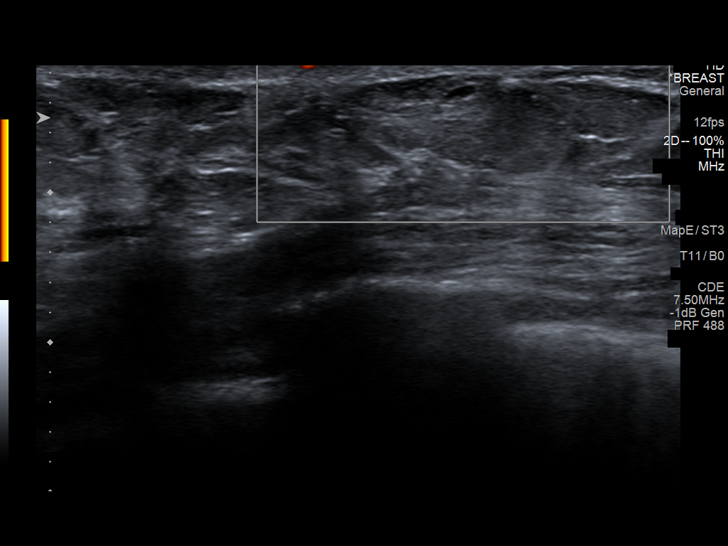
[im 2/11]
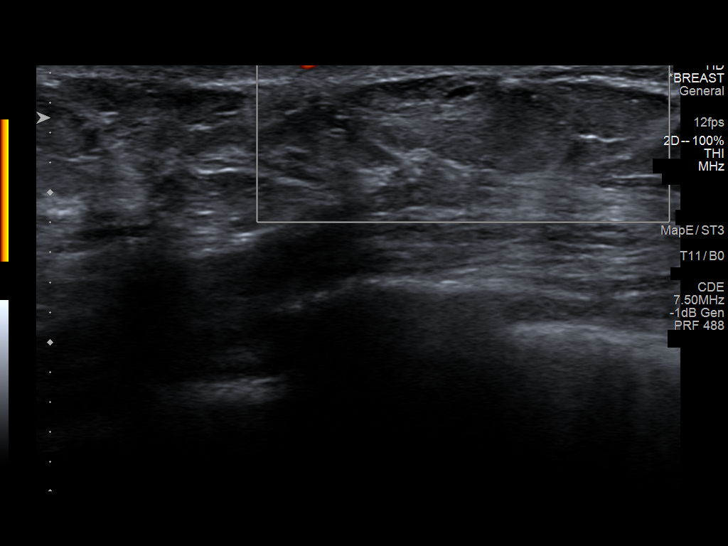
[im 3/11]
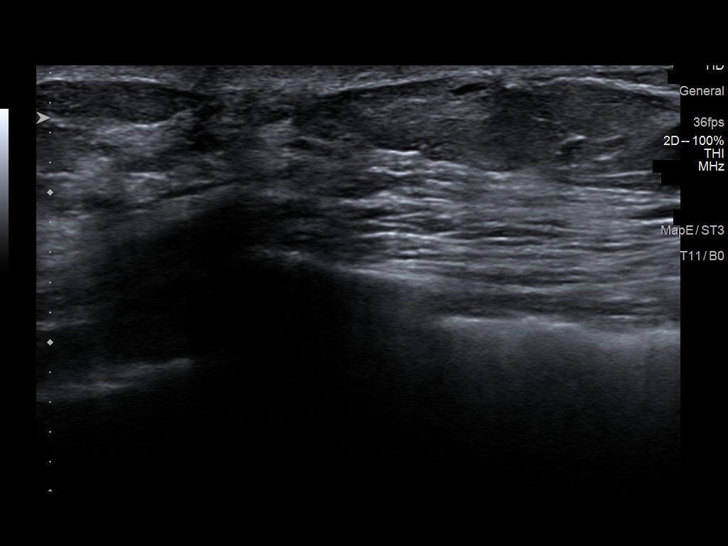
[im 4/11]
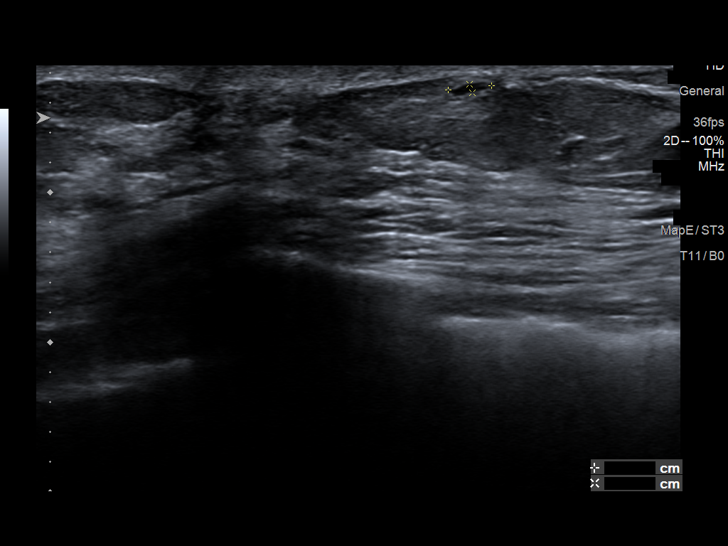
[im 5/11]
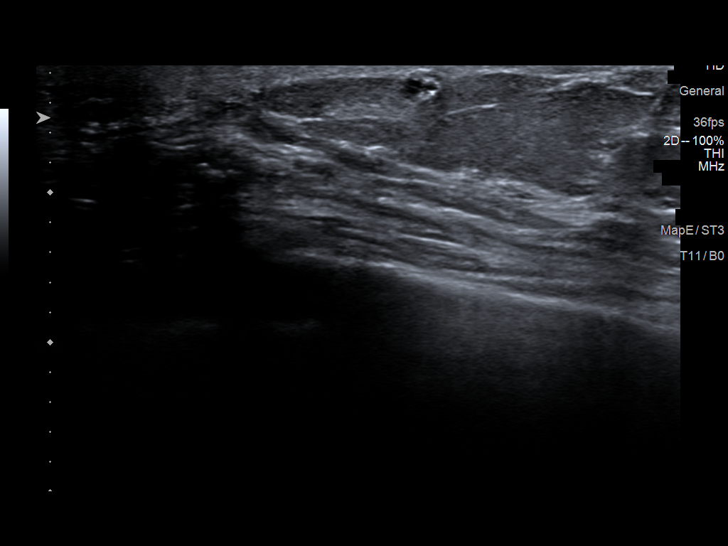
[im 6/11]
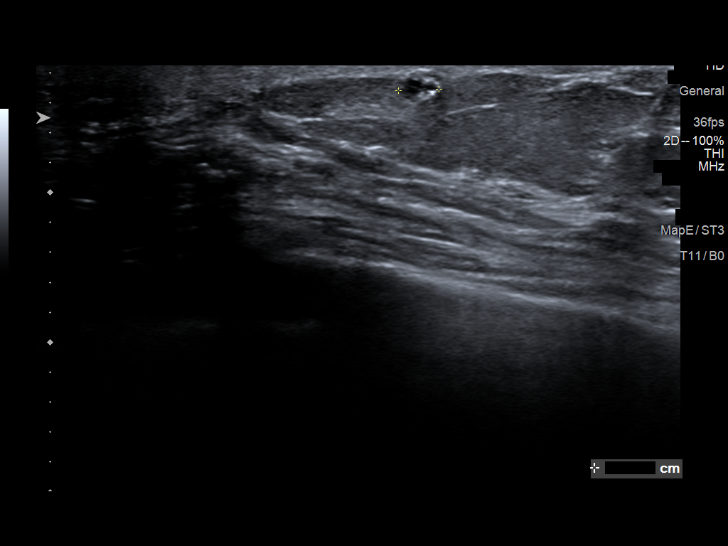
[im 7/11]
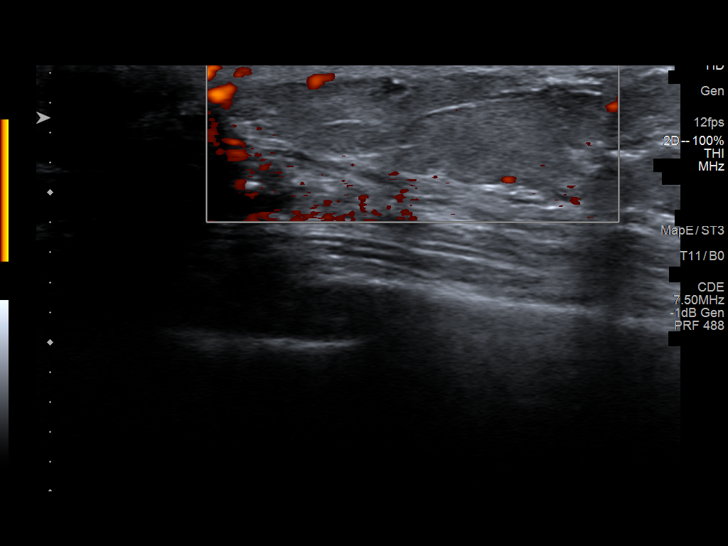
[im 8/11]
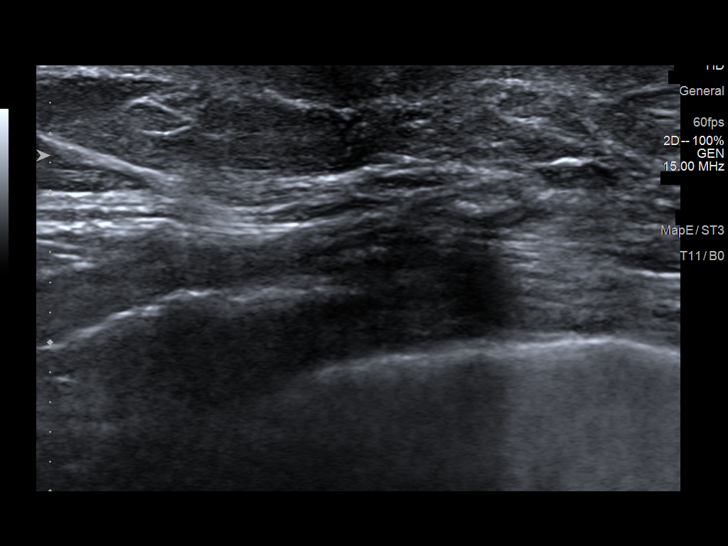
[im 9/11]
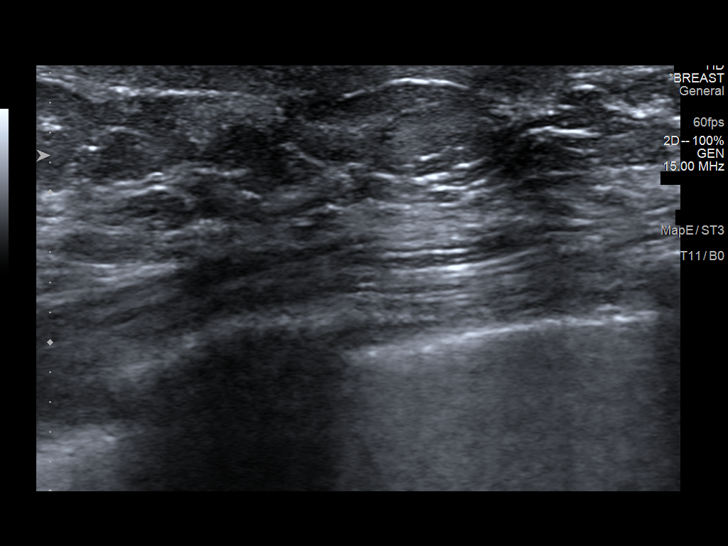
[im 10/11]
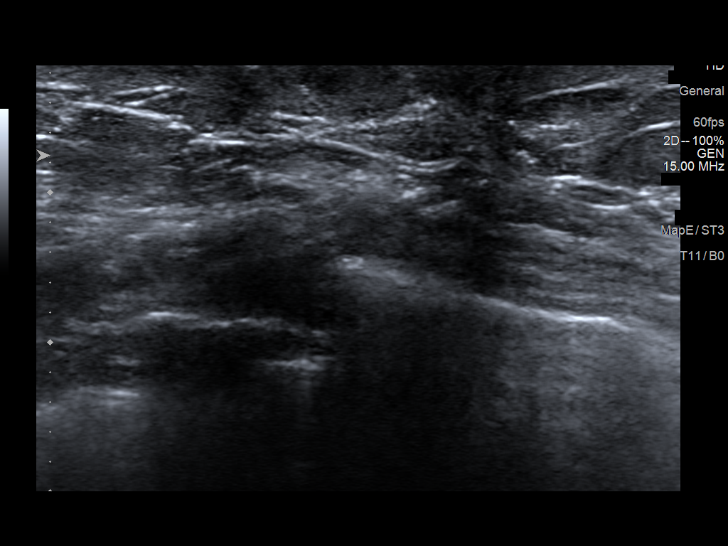
[im 11/11]
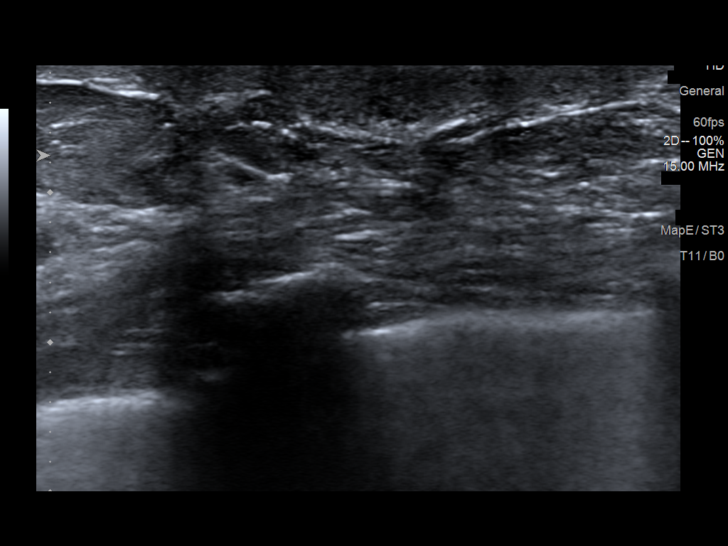

[11 of 11 positions shown; findings below may reference images not displayed]

FINDINGS: On physical exam, the left nipple and areola and periareolar skin
have normal appearances. There is no palpable thickening or mass.

Targeted ultrasound is performed, showing a normal appearing nipple
and retroareolar and periareolar tissues with the exception of a
small oval cystic area just beneath the skin and in the posterior
dermal layer in the 1:30 o'clock retroareolar breast. This measures
3 x 3 x 1 mm and has peripheral increased echogenicity suggesting
developing calcification. No definite corresponding abnormality on
the recent mammogram. No internal blood flow with power Doppler.
IMPRESSION: 1. 3 mm area of benign fat necrosis and oil cyst formation in the
1:30 o'clock retroareolar left breast.
2. No sonographic abnormalities in the left nipple or adjacent
retroareolar and periareolar tissues corresponding to the mild
asymmetrical enhancement seen on the MRI.

RECOMMENDATION:
Bilateral breast MRI in four months per the previous MRI
recommendation. That will be 6 months since the initial MRI showing
the mild asymmetrical enhancement in the left nipple, retroareolar
and periareolar region.

I have discussed the findings and recommendations with the patient.
If applicable, a reminder letter will be sent to the patient
regarding the next appointment.

BI-RADS CATEGORY  2: Benign.

## 2020-09-18 ENCOUNTER — Ambulatory Visit: Payer: Self-pay | Admitting: *Deleted

## 2020-09-19 DIAGNOSIS — H524 Presbyopia: Secondary | ICD-10-CM | POA: Diagnosis not present

## 2020-09-19 DIAGNOSIS — E119 Type 2 diabetes mellitus without complications: Secondary | ICD-10-CM | POA: Diagnosis not present

## 2020-09-19 DIAGNOSIS — Z0100 Encounter for examination of eyes and vision without abnormal findings: Secondary | ICD-10-CM | POA: Diagnosis not present

## 2020-09-19 DIAGNOSIS — H35032 Hypertensive retinopathy, left eye: Secondary | ICD-10-CM | POA: Diagnosis not present

## 2020-09-19 LAB — HM DIABETES EYE EXAM

## 2020-09-27 NOTE — Progress Notes (Signed)
Subjective:   Michelle Long is a 74 y.o. female who presents for Medicare Annual (Subsequent) preventive examination.  I connected with Michelle Long today by telephone and verified that I am speaking with the correct person using two identifiers. Location patient: home Location provider: work Persons participating in the virtual visit: patient, Marine scientist.    I discussed the limitations, risks, security and privacy concerns of performing an evaluation and management service by telephone and the availability of in person appointments. I also discussed with the patient that there may be a patient responsible charge related to this service. The patient expressed understanding and verbally consented to this telephonic visit.    Interactive audio and video telecommunications were attempted between this provider and patient, however failed, due to patient having technical difficulties OR patient did not have access to video capability.  We continued and completed visit with audio only.  Some vital signs may be absent or patient reported.   Time Spent with patient on telephone encounter: 25 minutes   Review of Systems     Cardiac Risk Factors include: advanced age (>21mn, >>27women);diabetes mellitus;dyslipidemia;hypertension     Objective:    Today's Vitals   09/28/20 1104  Weight: 152 lb (68.9 kg)  Height: '5\' 3"'  (1.6 m)   Body mass index is 26.93 kg/m.  Advanced Directives 09/28/2020 07/17/2020 03/19/2020 09/14/2019 07/16/2019 10/10/2018 07/10/2018  Does Patient Have a Medical Advance Directive? Yes Yes No Yes Yes Yes No  Type of [place] - [place] will - -  Does patient want to make changes to medical advance directive? - No - Patient declined - No - Patient declined No - Patient declined - -  Copy of [place] Chart?  No - copy requested No - copy requested - No - copy requested No - copy requested - -  Would patient like information on creating a medical advance directive? - No - Patient declined - - - - -    Current Medications (verified) Outpatient Encounter Medications as of 09/28/2020  Medication Sig  . albuterol (VENTOLIN HFA) 108 (90 Base) MCG/ACT inhaler INHALE TWO PUFFS INTO LUNGS EVERY 6 HOURS AS NEEDED FOR WHEEZING OR SHORTNESS OF BREATH  . alendronate (FOSAMAX) 70 MG tablet TAKE 1 TABLET BY MOUTH ONCE A WEEK WITH  A  FULL  GLASS  OF  WATER  ON  AN  EMPTY  STOMACH  . aspirin 81 MG tablet Take 81 mg by mouth daily.  .Marland Kitchenb complex vitamins tablet Take 1 tablet by mouth daily.  . Blood Glucose Monitoring Suppl (TRUE METRIX METER) w/Device KIT Check sugars once daily as directed. E11.9  . budesonide-formoterol (SYMBICORT) 160-4.5 MCG/ACT inhaler Inhale 2 puffs into the lungs 2 (two) times daily.  . Calcium Carb-Cholecalciferol (CALCIUM 1000 + D PO) Take by mouth.  . Cholecalciferol (VITAMIN D) 2000 units CAPS Take 5,000 Units by mouth daily.   . citalopram (CELEXA) 10 MG tablet Take 0.5 tablets (5 mg total) by mouth at bedtime.  .Marland Kitchenezetimibe (ZETIA) 10 MG tablet TAKE 1 TABLET BY MOUTH DAILY.  .Marland KitchenLORazepam (ATIVAN) 0.5 MG tablet Take 1 tablet (0.5 mg total) by mouth 2 (two) times daily as needed.  .Marland Kitchenlosartan (COZAAR) 50 MG tablet TAKE 1 TABLET EVERY DAY  . metFORMIN (GLUCOPHAGE) 1000 MG tablet TAKE 1 TABLET TWO TIMES DAILY WITH A MEAL.  . Multiple  Vitamin (MULTIVITAMIN) tablet Take 1 tablet by mouth daily.  . Multiple Vitamins-Minerals (HAIR SKIN AND NAILS FORMULA) TABS Take 1 tablet by mouth daily. 2,500 mg daily  . OLANZapine (ZYPREXA) 5 MG tablet 1/2 tablet daily  . pantoprazole (PROTONIX) 40 MG tablet TAKE 1 TABLET EVERY DAY  . rosuvastatin (CRESTOR) 5 MG tablet Take 1 tablet (5 mg total) by mouth 3 (three) times a week.  Marland Kitchen SHINGRIX injection   . Spacer/Aero Chamber Mouthpiece MISC To use with  inhaler  . TRUE METRIX BLOOD GLUCOSE TEST test strip TEST BLOOD SUGAR EVERY DAY  . TRUEplus Lancets 33G MISC TEST ONE TIME DAILY AS DIRECTED  . LORazepam (ATIVAN) 1 MG tablet  (Patient not taking: No sig reported)   No facility-administered encounter medications on file as of 09/28/2020.    Allergies (verified) Fluoxetine, Lamotrigine, Sulfa antibiotics, Statins, Fluoxetine hcl, and Sulfa drugs cross reactors   History: Past Medical History:  Diagnosis Date  . Asthma   . Bipolar disorder ([place])   . Bladder cancer ([place])   . Breast cancer ([place])   . Cancer of lower-inner quadrant of left female breast ([place]) 12/08/2013  . Depression   . Diabetes mellitus   . GERD (gastroesophageal reflux disease)   . Hepatitis B infection   . History of transfusion of whole blood    Approximately 2009, due to breast cancer/chemo.  Marland Kitchen Hyperlipidemia   . Personal history of chemotherapy   . Personal history of radiation therapy    Past Surgical History:  Procedure Laterality Date  . ABDOMINAL HYSTERECTOMY    . APPENDECTOMY  1961  . bladder cancer  2006  . BREAST LUMPECTOMY Left 2007  . PARTIAL HYSTERECTOMY  1991  . TONSILLECTOMY  1951   Family History  Problem Relation Age of Onset  . Arthritis Mother   . Breast cancer Mother   . Transient ischemic attack Mother   . Hypertension Mother   . Heart disease Father   . Diabetes Father   . Cancer Father        bladder cancer  . Throat cancer Father    Social History   Socioeconomic History  . Marital status: Divorced    Spouse name: Not on file  . Number of children: Not on file  . Years of education: Not on file  . Highest education level: Not on file  Occupational History  . Occupation: retired    Comment: retired  Tobacco Use  . Smoking status: Former Smoker    Packs/day: 1.50    Years: 33.00    Pack years: 49.50    Types: Cigarettes    Start date: 12/08/1964    Quit date: 08/21/1998    Years since quitting: 22.1  . Smokeless  tobacco: Never Used  . Tobacco comment: quit 16 years ago  Vaping Use  . Vaping Use: Never used  Substance and Sexual Activity  . Alcohol use: Yes    Alcohol/week: 0.0 standard drinks    Comment: very rarely  . Drug use: No  . Sexual activity: Not Currently    Partners: Male  Other Topics Concern  . Not on file  Social History Narrative   Exercise--- walks with neighbor   Social Determinants of Health   Financial Resource Strain: Low Risk   . Difficulty of Paying Living Expenses: Not hard at all  Food Insecurity: No Food Insecurity  . Worried About Charity fundraiser in the Last Year: Never true  . Ran Out of Food  in the Last Year: Never true  Transportation Needs: No Transportation Needs  . Lack of Transportation (Medical): No  . Lack of Transportation (Non-Medical): No  Physical Activity: Sufficiently Active  . Days of Exercise per Week: 3 days  . Minutes of Exercise per Session: 60 min  Stress: No Stress Concern Present  . Feeling of Stress : Not at all  Social Connections: Moderately Integrated  . Frequency of Communication with Friends and Family: More than three times a week  . Frequency of Social Gatherings with Friends and Family: More than three times a week  . Attends Religious Services: More than 4 times per year  . Active Member of Clubs or Organizations: Yes  . Attends Archivist Meetings: More than 4 times per year  . Marital Status: Divorced    Tobacco Counseling Counseling given: Not Answered Comment: quit 16 years ago   Clinical Intake:  Pre-visit preparation completed: Yes  Pain : No/denies pain     Nutritional Status: BMI 25 -29 Overweight Nutritional Risks: None Diabetes: Yes CBG done?: No Did pt. bring in CBG monitor from home?: No (phone visit)  How often do you need to have someone help you when you read instructions, pamphlets, or other written materials from your doctor or pharmacy?: 1 - Never What is the last grade level  you completed in school?: BS degree  Diabetes:  Is the patient diabetic?  Yes  If diabetic, was a CBG obtained today?  No  Did the patient bring in their glucometer from home?  No phone visit How often do you monitor your CBG's? daily.   Financial Strains and Diabetes Management:  Are you having any financial strains with the device, your supplies or your medication? No .  Does the patient want to be seen by Chronic Care Management for management of their diabetes?  No  Would the patient like to be referred to a Nutritionist or for Diabetic Management?  No   Diabetic Exams:  Diabetic Eye Exam: Completed 09/19/2020  Diabetic Foot Exam: Completed 07/17/2020.    Interpreter Needed?: No  Information entered by :: Caroleen Hamman LPN   Activities of Daily Living In your present state of health, do you have any difficulty performing the following activities: 09/28/2020  Hearing? N  Vision? N  Difficulty concentrating or making decisions? Y  Comment occasionally  Walking or climbing stairs? N  Dressing or bathing? N  Doing errands, shopping? N  Preparing Food and eating ? N  Using the Toilet? N  In the past six months, have you accidently leaked urine? N  Do you have problems with loss of bowel control? N  Managing your Medications? N  Managing your Finances? N  Housekeeping or managing your Housekeeping? N  Some recent data might be hidden    Patient Care Team: Carollee Herter, Alferd Apa, DO as PCP - General (Family Medicine) Hale Bogus., MD as Referring Physician (Gastroenterology) Franchot Gallo, MD as Consulting Physician (Urology) Ricard Dillon, MD (Psychiatry) Trula Slade, DPM as Consulting Physician (Podiatry) Aretha Parrot as Consulting Physician (Optometry) Clearence Ped, DDS (Dentistry)  Indicate any recent Medical Services you may have received from other than [facility] providers in the past year (date may be approximate).     Assessment:   This  is a routine wellness examination for [facility].  Hearing/Vision screen  Hearing Screening   '125Hz'  '250Hz'  '500Hz'  '1000Hz'  '2000Hz'  '3000Hz'  '4000Hz'  '6000Hz'  '8000Hz'   Right ear:  Left ear:           Comments: No issues  Vision Screening Comments: Cataracts removed Reading glasses Last eye exam-09/2020-Dr. Mendel Ryder  Dietary issues and exercise activities discussed: Current Exercise Habits: Home exercise routine, Type of exercise: walking, Time (Minutes): 60, Frequency (Times/Week): 3, Weekly Exercise (Minutes/Week): 180, Intensity: Mild, Exercise limited by: None identified  Goals    . Eat more fruits and vegetables    . Increase physical activity     Begin silver sneakers    . Increase water intake      Depression Screen PHQ 2/9 Scores 09/28/2020 09/14/2019 03/09/2018 03/03/2017 12/10/2016 11/17/2015 01/30/2015  PHQ - 2 Score 0 0 0 0 0 0 0    Fall Risk Fall Risk  09/28/2020 09/14/2019 03/09/2018 03/03/2017 12/10/2016  Falls in the past year? 0 0 No No No  Number falls in past yr: 0 0 - - -  Injury with Fall? 0 0 - - -  Risk for fall due to : - - - - -  Follow up Falls prevention discussed Education provided;Falls prevention discussed - - -    FALL RISK PREVENTION PERTAINING TO THE HOME:  Any stairs in or around the home? Yes  If so, are there any without handrails? No  Home free of loose throw rugs in walkways, pet beds, electrical cords, etc? Yes  Adequate lighting in your home to reduce risk of falls? Yes   ASSISTIVE DEVICES UTILIZED TO PREVENT FALLS:  Life alert? No  Use of a cane, walker or w/c? No  Grab bars in the bathroom? No  Shower chair or bench in shower? No  Elevated toilet seat or a handicapped toilet? No   TIMED UP AND GO:  Was the test performed? No . Phone visit   Cognitive Function:Normal cognitive status assessed by  this Nurse Health Advisor. No abnormalities found.   MMSE - Mini Mental State Exam 03/03/2017  Orientation to time 5  Orientation to Place 5   Registration 3  Attention/ Calculation 5  Recall 3  Language- name 2 objects 2  Language- repeat 1  Language- follow 3 step command 3  Language- read & follow direction 1  Write a sentence 1  Copy design 1  Total score 30     6CIT Screen 09/28/2020 09/14/2019  What Year? 0 points 0 points  What month? 0 points 0 points  What time? 0 points 0 points  Count back from 20 0 points 0 points  Months in reverse 0 points 0 points  Repeat phrase 0 points 0 points  Total Score 0 0    Immunizations Immunization History  Administered Date(s) Administered  . Fluad Quad(high Dose 65+) 07/13/2019, 07/17/2020  . Influenza Whole 07/01/2012  . Influenza, High Dose Seasonal PF 06/19/2017, 07/09/2018  . Influenza,inj,Quad PF,6+ Mos 08/12/2013, 07/22/2014, 07/03/2016  . Influenza-Unspecified 09/20/2015, 06/29/2017  . Moderna SARS-COVID-2 Vaccination 11/19/2019, 12/17/2019, 08/14/2020  . Pneumococcal Conjugate-13 01/30/2015  . Pneumococcal Polysaccharide-23 10/23/2003, 10/22/2013  . Td 06/22/2013  . Tdap 09/20/2015  . Zoster 09/07/2013  . Zoster Recombinat (Shingrix) 07/25/2019    TDAP status: Up to date  Flu Vaccine status: Up to date  Pneumococcal vaccine status: Up to date  Covid-19 vaccine status: Completed vaccines  Qualifies for Shingles Vaccine? Yes   Zostavax completed Yes   Shingrix Completed?: Yes  Screening Tests Health Maintenance  Topic Date Due  . HEMOGLOBIN A1C  01/14/2021  . COVID-19 Vaccine (4 - Booster for Moderna series) 02/12/2021  .  FOOT EXAM  07/17/2021  . OPHTHALMOLOGY EXAM  09/19/2021  . MAMMOGRAM  07/03/2022  . COLONOSCOPY  06/04/2023  . TETANUS/TDAP  09/19/2025  . INFLUENZA VACCINE  Completed  . DEXA SCAN  Completed  . Hepatitis C Screening  Completed  . PNA vac Low Risk Adult  Completed    Health Maintenance  There are no preventive care reminders to display for this patient.  Colorectal cancer screening: Type of screening: Colonoscopy.  Completed 06/13/2018. Repeat every 5 years  Mammogram status: Completed Bilateral-07/03/2020. Repeat every year  Bone Density status: Completed 08/14/2020. Results reflect: Bone density results: OSTEOPENIA. Repeat every 2 years.  Lung Cancer Screening: (Low Dose CT Chest recommended if Age 61-80 years, 30 pack-year currently smoking OR have quit w/in 15years.) does not qualify.     Additional Screening:  Hepatitis C Screening:; Completed 11/17/2015  Vision Screening: Recommended annual ophthalmology exams for early detection of glaucoma and other disorders of the eye. Is the patient up to date with their annual eye exam?  Yes  Who is the provider or what is the name of the office in which the patient attends annual eye exams? Dr. Mendel Ryder   Dental Screening: Recommended annual dental exams for proper oral hygiene  Community Resource Referral / Chronic Care Management: CRR required this visit?  No   CCM required this visit?  No      Plan:     I have personally reviewed and noted the following in the patient's chart:   . Medical and social history . Use of alcohol, tobacco or illicit drugs  . Current medications and supplements . Functional ability and status . Nutritional status . Physical activity . Advanced directives . List of other physicians . Hospitalizations, surgeries, and ER visits in previous 12 months . Vitals . Screenings to include cognitive, depression, and falls . Referrals and appointments  In addition, I have reviewed and discussed with patient certain preventive protocols, quality metrics, and best practice recommendations. A written personalized care plan for preventive services as well as general preventive health recommendations were provided to patient.   Due to this being a telephonic visit, the after visit summary with patients personalized plan was offered to patient via mail or my-chart. Patient would like to access on my-chart.  Marta Antu, LPN   [id number]  Nurse Health Advisor  Nurse Notes: None

## 2020-09-28 ENCOUNTER — Ambulatory Visit (INDEPENDENT_AMBULATORY_CARE_PROVIDER_SITE_OTHER): Payer: Medicare HMO

## 2020-09-28 VITALS — Ht 63.0 in | Wt 152.0 lb

## 2020-09-28 DIAGNOSIS — Z0000 Encounter for general adult medical examination without abnormal findings: Secondary | ICD-10-CM

## 2020-09-28 NOTE — Patient Instructions (Signed)
Michelle Long , Thank you for taking time to come for your Medicare Wellness Visit. I appreciate your ongoing commitment to your health goals. Please review the following plan we discussed and let me know if I can assist you in the future.   Screening recommendations/referrals: Colonoscopy: Completed 06/04/2018-Not required after age 74. Mammogram: Completed 07/03/2020- Repeat as instructed by provider. Bone Density: Completed 11/15/2019- Due 08/14/2022. Recommended yearly ophthalmology/optometry visit for glaucoma screening and checkup Recommended yearly dental visit for hygiene and checkup  Vaccinations: Influenza vaccine: Up to date Pneumococcal vaccine: completed vaccines Tdap vaccine: Up to date- Due-09/09/2025 Shingles vaccine: Completed vaccines Covid-19:Completed vaccines  Advanced directives: Please bring a copy for your chart  Conditions/risks identified: See problem list  Next appointment: Follow up in one year for your annual wellness visit 10/04/2021 @ 11:00   Preventive Care 65 Years and Older, Female Preventive care refers to lifestyle choices and visits with your health care provider that can promote health and wellness. What does preventive care include?  A yearly physical exam. This is also called an annual well check.  Dental exams once or twice a year.  Routine eye exams. Ask your health care provider how often you should have your eyes checked.  Personal lifestyle choices, including:  Daily care of your teeth and gums.  Regular physical activity.  Eating a healthy diet.  Avoiding tobacco and drug use.  Limiting alcohol use.  Practicing safe sex.  Taking low-dose aspirin every day.  Taking vitamin and mineral supplements as recommended by your health care provider. What happens during an annual well check? The services and screenings done by your health care provider during your annual well check will depend on your age, overall health, lifestyle risk  factors, and family history of disease. Counseling  Your health care provider may ask you questions about your:  Alcohol use.  Tobacco use.  Drug use.  Emotional well-being.  Home and relationship well-being.  Sexual activity.  Eating habits.  History of falls.  Memory and ability to understand (cognition).  Work and work Statistician.  Reproductive health. Screening  You may have the following tests or measurements:  Height, weight, and BMI.  Blood pressure.  Lipid and cholesterol levels. These may be checked every 5 years, or more frequently if you are over 38 years old.  Skin check.  Lung cancer screening. You may have this screening every year starting at age 74 if you have a 30-pack-year history of smoking and currently smoke or have quit within the past 15 years.  Fecal occult blood test (FOBT) of the stool. You may have this test every year starting at age 14.  Flexible sigmoidoscopy or colonoscopy. You may have a sigmoidoscopy every 5 years or a colonoscopy every 10 years starting at age 35.  Hepatitis C blood test.  Hepatitis B blood test.  Sexually transmitted disease (STD) testing.  Diabetes screening. This is done by checking your blood sugar (glucose) after you have not eaten for a while (fasting). You may have this done every 1-3 years.  Bone density scan. This is done to screen for osteoporosis. You may have this done starting at age 81.  Mammogram. This may be done every 1-2 years. Talk to your health care provider about how often you should have regular mammograms. Talk with your health care provider about your test results, treatment options, and if necessary, the need for more tests. Vaccines  Your health care provider may recommend certain vaccines, such as:  Influenza  vaccine. This is recommended every year.  Tetanus, diphtheria, and acellular pertussis (Tdap, Td) vaccine. You may need a Td booster every 10 years.  Zoster vaccine. You  may need this after age 58.  Pneumococcal 13-valent conjugate (PCV13) vaccine. One dose is recommended after age 64.  Pneumococcal polysaccharide (PPSV23) vaccine. One dose is recommended after age 54. Talk to your health care provider about which screenings and vaccines you need and how often you need them. This information is not intended to replace advice given to you by your health care provider. Make sure you discuss any questions you have with your health care provider. Document Released: 11/03/2015 Document Revised: 06/26/2016 Document Reviewed: 08/08/2015 Elsevier Interactive Patient Education  2017 [place] Prevention in the Home Falls can cause injuries. They can happen to people of all ages. There are many things you can do to make your home safe and to help prevent falls. What can I do on the outside of my home?  Regularly fix the edges of walkways and driveways and fix any cracks.  Remove anything that might make you trip as you walk through a door, such as a raised step or threshold.  Trim any bushes or trees on the path to your home.  Use bright outdoor lighting.  Clear any walking paths of anything that might make someone trip, such as rocks or tools.  Regularly check to see if handrails are loose or broken. Make sure that both sides of any steps have handrails.  Any raised decks and porches should have guardrails on the edges.  Have any leaves, snow, or ice cleared regularly.  Use sand or salt on walking paths during winter.  Clean up any spills in your garage right away. This includes oil or grease spills. What can I do in the bathroom?  Use night lights.  Install grab bars by the toilet and in the tub and shower. Do not use towel bars as grab bars.  Use non-skid mats or decals in the tub or shower.  If you need to sit down in the shower, use a plastic, non-slip stool.  Keep the floor dry. Clean up any water that spills on the floor as soon as  it happens.  Remove soap buildup in the tub or shower regularly.  Attach bath mats securely with double-sided non-slip rug tape.  Do not have throw rugs and other things on the floor that can make you trip. What can I do in the bedroom?  Use night lights.  Make sure that you have a light by your bed that is easy to reach.  Do not use any sheets or blankets that are too big for your bed. They should not hang down onto the floor.  Have a firm chair that has side arms. You can use this for support while you get dressed.  Do not have throw rugs and other things on the floor that can make you trip. What can I do in the kitchen?  Clean up any spills right away.  Avoid walking on wet floors.  Keep items that you use a lot in easy-to-reach places.  If you need to reach something above you, use a strong step stool that has a grab bar.  Keep electrical cords out of the way.  Do not use floor polish or wax that makes floors slippery. If you must use wax, use non-skid floor wax.  Do not have throw rugs and other things on the floor that can  make you trip. What can I do with my stairs?  Do not leave any items on the stairs.  Make sure that there are handrails on both sides of the stairs and use them. Fix handrails that are broken or loose. Make sure that handrails are as long as the stairways.  Check any carpeting to make sure that it is firmly attached to the stairs. Fix any carpet that is loose or worn.  Avoid having throw rugs at the top or bottom of the stairs. If you do have throw rugs, attach them to the floor with carpet tape.  Make sure that you have a light switch at the top of the stairs and the bottom of the stairs. If you do not have them, ask someone to add them for you. What else can I do to help prevent falls?  Wear shoes that:  Do not have high heels.  Have rubber bottoms.  Are comfortable and fit you well.  Are closed at the toe. Do not wear sandals.  If  you use a stepladder:  Make sure that it is fully opened. Do not climb a closed stepladder.  Make sure that both sides of the stepladder are locked into place.  Ask someone to hold it for you, if possible.  Clearly mark and make sure that you can see:  Any grab bars or handrails.  First and last steps.  Where the edge of each step is.  Use tools that help you move around (mobility aids) if they are needed. These include:  Canes.  Walkers.  Scooters.  Crutches.  Turn on the lights when you go into a dark area. Replace any light bulbs as soon as they burn out.  Set up your furniture so you have a clear path. Avoid moving your furniture around.  If any of your floors are uneven, fix them.  If there are any pets around you, be aware of where they are.  Review your medicines with your doctor. Some medicines can make you feel dizzy. This can increase your chance of falling. Ask your doctor what other things that you can do to help prevent falls. This information is not intended to replace advice given to you by your health care provider. Make sure you discuss any questions you have with your health care provider. Document Released: 08/03/2009 Document Revised: 03/14/2016 Document Reviewed: 11/11/2014 Elsevier Interactive Patient Education  2017 [employer].

## 2020-10-30 DIAGNOSIS — C678 Malignant neoplasm of overlapping sites of bladder: Secondary | ICD-10-CM | POA: Diagnosis not present

## 2020-11-02 ENCOUNTER — Other Ambulatory Visit: Payer: Self-pay | Admitting: Family Medicine

## 2020-11-04 ENCOUNTER — Other Ambulatory Visit: Payer: Self-pay | Admitting: Family Medicine

## 2020-11-11 ENCOUNTER — Other Ambulatory Visit: Payer: Self-pay | Admitting: Family Medicine

## 2020-11-11 DIAGNOSIS — J4520 Mild intermittent asthma, uncomplicated: Secondary | ICD-10-CM

## 2020-11-14 ENCOUNTER — Other Ambulatory Visit: Payer: Self-pay | Admitting: *Deleted

## 2020-11-14 DIAGNOSIS — I10 Essential (primary) hypertension: Secondary | ICD-10-CM

## 2020-11-14 DIAGNOSIS — F3181 Bipolar II disorder: Secondary | ICD-10-CM

## 2020-11-14 DIAGNOSIS — K219 Gastro-esophageal reflux disease without esophagitis: Secondary | ICD-10-CM

## 2020-11-14 MED ORDER — TRUE METRIX BLOOD GLUCOSE TEST VI STRP: ORAL_STRIP | 1 refills | Status: DC

## 2020-11-14 MED ORDER — PANTOPRAZOLE SODIUM 40 MG PO TBEC: 40.0000 mg | DELAYED_RELEASE_TABLET | Freq: Every day | ORAL | 1 refills | Status: DC

## 2020-11-14 MED ORDER — OLANZAPINE 5 MG PO TABS: ORAL_TABLET | ORAL | 1 refills | Status: AC

## 2020-11-14 MED ORDER — TRUEPLUS LANCETS 33G MISC
1 refills | Status: DC
Start: 2020-11-14 — End: 2021-03-28

## 2020-11-14 MED ORDER — LOSARTAN POTASSIUM 50 MG PO TABS: 50.0000 mg | ORAL_TABLET | Freq: Every day | ORAL | 1 refills | Status: DC

## 2020-12-06 ENCOUNTER — Telehealth: Payer: Self-pay | Admitting: Family Medicine

## 2020-12-06 DIAGNOSIS — J4520 Mild intermittent asthma, uncomplicated: Secondary | ICD-10-CM

## 2020-12-06 MED ORDER — ALBUTEROL SULFATE HFA 108 (90 BASE) MCG/ACT IN AERS: INHALATION_SPRAY | RESPIRATORY_TRACT | 5 refills | Status: DC

## 2020-12-06 NOTE — Telephone Encounter (Signed)
Refill sent.

## 2020-12-06 NOTE — Telephone Encounter (Signed)
Medication: albuterol (VENTOLIN HFA) 108 (90 Base) MCG/ACT inhaler [[id number]   Has the patient contacted their pharmacy? No. (If no, request that the patient contact the pharmacy for the refill.) (If yes, when and what did the pharmacy advise?)  Preferred Pharmacy (with phone number or street name):  [place], [place] Phone:  [phone]  Fax:  [phone]       Agent: Please be advised that RX refills may take up to 3 business days. We ask that you follow-up with your pharmacy.

## 2020-12-11 ENCOUNTER — Other Ambulatory Visit: Payer: Self-pay | Admitting: Family Medicine

## 2020-12-11 DIAGNOSIS — Z8249 Family history of ischemic heart disease and other diseases of the circulatory system: Secondary | ICD-10-CM

## 2020-12-11 DIAGNOSIS — R9389 Abnormal findings on diagnostic imaging of other specified body structures: Secondary | ICD-10-CM

## 2020-12-22 ENCOUNTER — Other Ambulatory Visit: Payer: Self-pay | Admitting: Family Medicine

## 2021-01-04 ENCOUNTER — Ambulatory Visit
Admission: RE | Admit: 2021-01-04 | Discharge: 2021-01-04 | Disposition: A | Discharge status: Home / Self Care | Payer: Medicare HMO | Source: Ambulatory Visit | Attending: Family Medicine | Admitting: Family Medicine

## 2021-01-04 ENCOUNTER — Other Ambulatory Visit: Payer: Self-pay

## 2021-01-04 ENCOUNTER — Inpatient Hospital Stay: Admission: RE | Admit: 2021-01-04 | Payer: Medicare HMO | Source: Ambulatory Visit

## 2021-01-04 DIAGNOSIS — R9389 Abnormal findings on diagnostic imaging of other specified body structures: Secondary | ICD-10-CM

## 2021-01-04 DIAGNOSIS — N6312 Unspecified lump in the right breast, upper inner quadrant: Secondary | ICD-10-CM | POA: Diagnosis not present

## 2021-01-04 IMAGING — MR MR BREAST BILAT WO/W CM
8 of 12 series · 29 of 48 positions shown · IV contrast (gadavist)
Comparison: Previous exams including breast MRI dated [DATE],
LEFT breast ultrasound dated [DATE] and most recent bilateral
screening mammogram dated [DATE].

CLINICAL DATA: Follow-up for indeterminate enhancement in and just
deep to the nipple that was demonstrated on breast MRI of
[DATE]. Patient has a personal history of LEFT breast cancer in
[J0] status post lumpectomy, radiation and chemotherapy. Patient
with a family history of breast cancer including mother and sister.

LABS:  Not performed at [REDACTED].
EXAM:
BILATERAL BREAST MRI WITH AND WITHOUT CONTRAST
TECHNIQUE: Multiplanar, multisequence MR images of both breasts were obtained
prior to and following the intravenous administration of 7 ml of
Gadavist

[Series 2: t2_tirm_tra ipat (a-p) · axial · 3.0mm · 0.70mm/px · 1 of 55 slices shown]
[im 1/55]
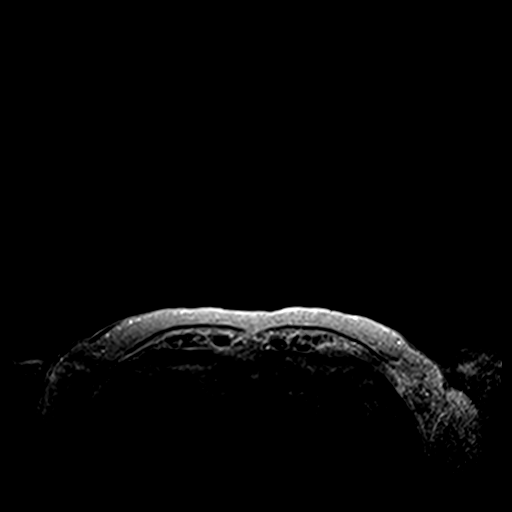

[Series 3: fl3d pre-cm no · axial · non-contrast · 1.2mm · 0.94mm/px · z∈[-52,+119]mm · 5 of 144 slices shown]
[im 1/144]
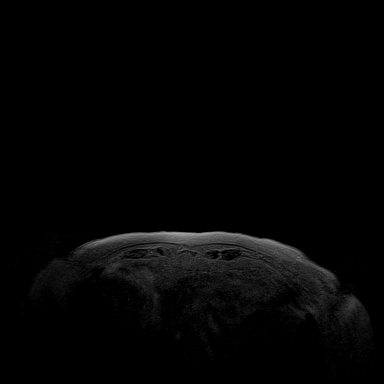
[im 36/144]
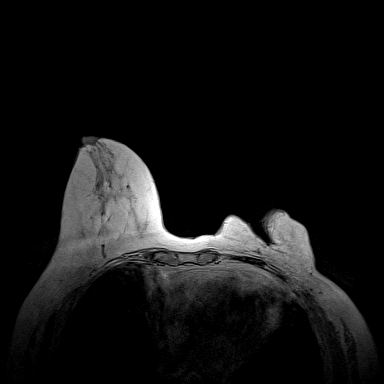
[im 72/144]
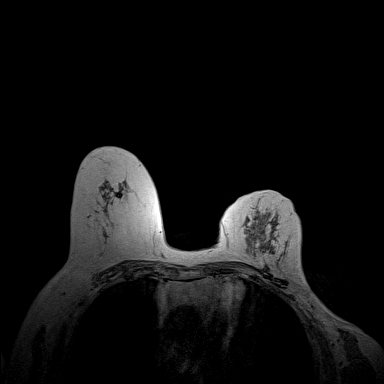
[im 108/144]
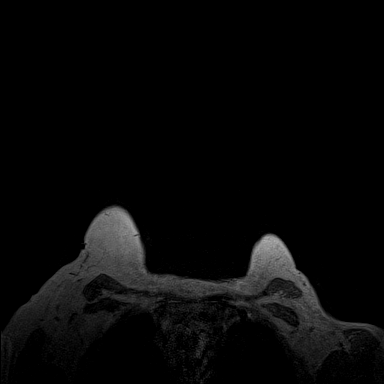
[im 144/144]
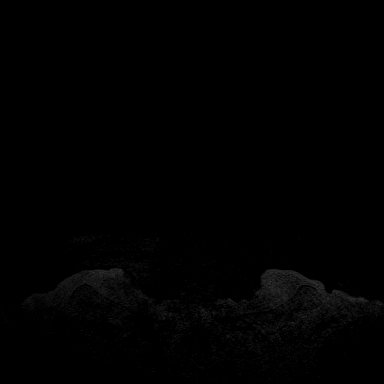

[Series 4: fl3d pre-cm · axial · non-contrast · 1.2mm · 0.94mm/px · z∈[-52,+119]mm · 5 of 144 slices shown]
[im 1/144]
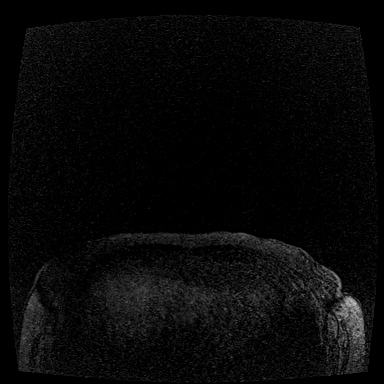
[im 36/144]
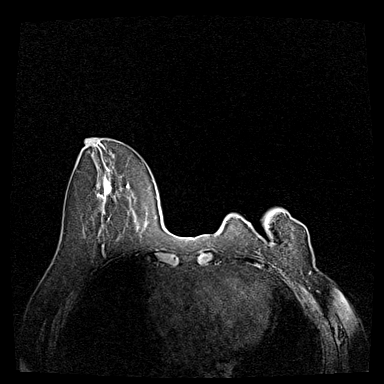
[im 72/144]
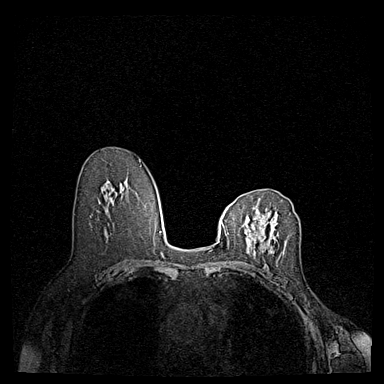
[im 108/144]
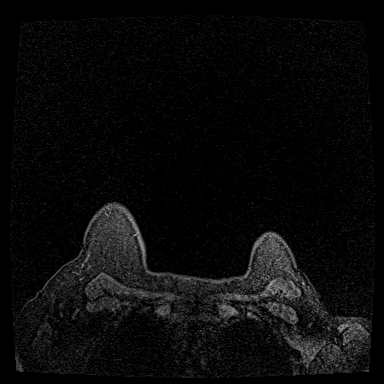
[im 144/144]
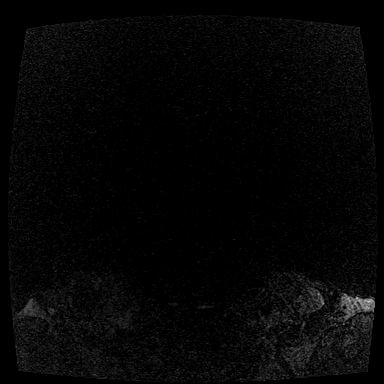

[Series 5: fl3d post-cm 20 · axial · 1.2mm · 0.94mm/px · z∈[-52,+119]mm · 5 of 144 slices shown (1 of 3)]
[im 1/144]
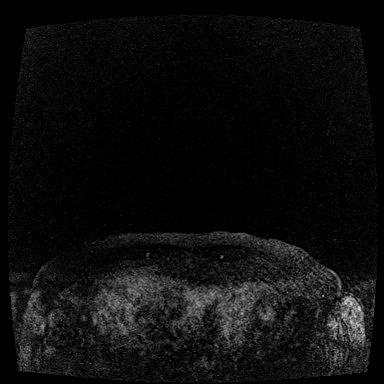
[im 36/144]
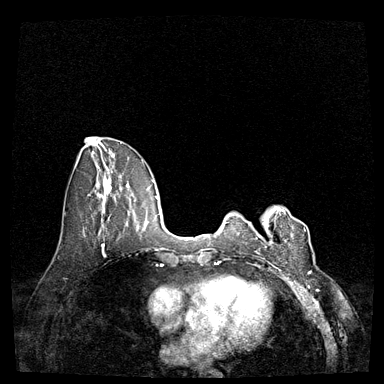
[im 72/144]
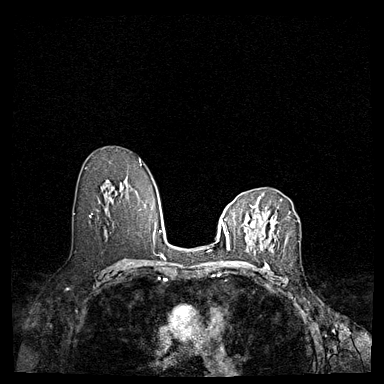
[im 108/144]
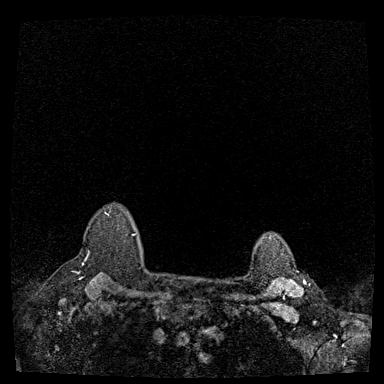
[im 144/144]
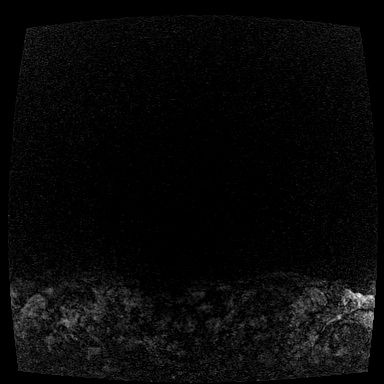

[Series 6: fl3d post-cm 20 · axial · 1.2mm · 0.94mm/px · z∈[-52,+119]mm · 5 of 144 slices shown (2 of 3)]
[im 1/144]
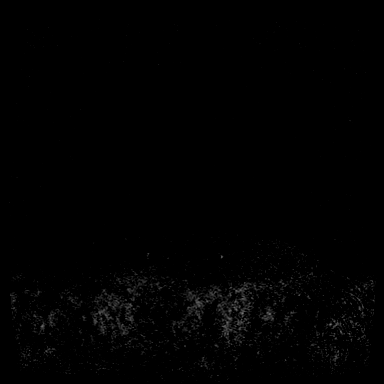
[im 36/144]
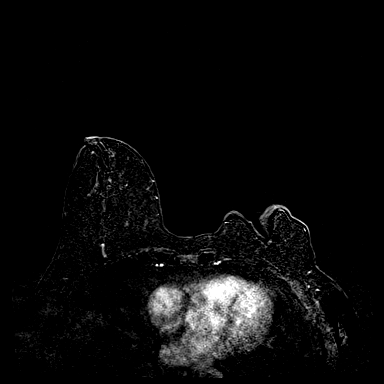
[im 72/144]
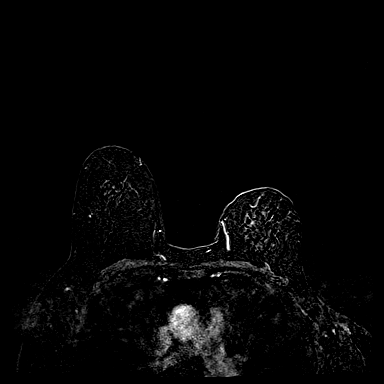
[im 108/144]
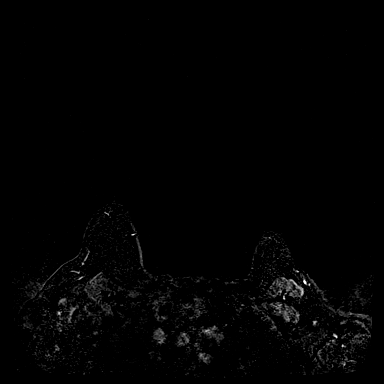
[im 144/144]
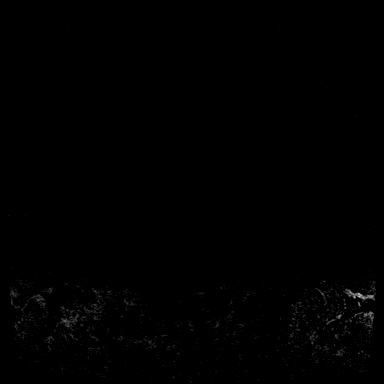

[Series 7: fl3d post-cm 20 · axial · 172.8mm · 0.94mm/px · 1 of 1 slices shown (3 of 3)]
[im 1/1]
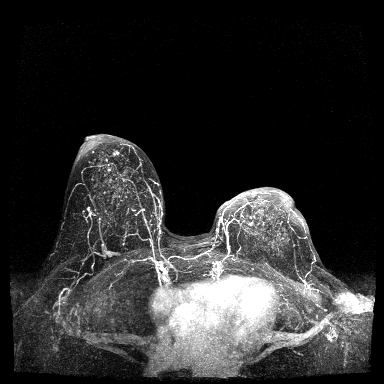

[Series 8: fl3d post-cm 3min · axial · 1.2mm · 0.94mm/px · z∈[-52,+119]mm · 6 of 144 slices shown]
[im 1/144]
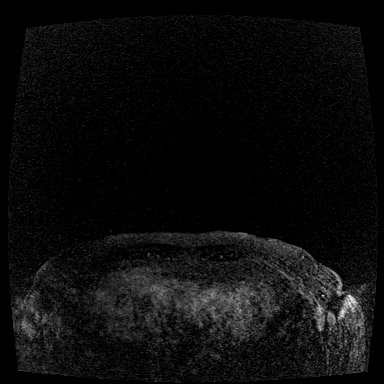
[im 29/144]
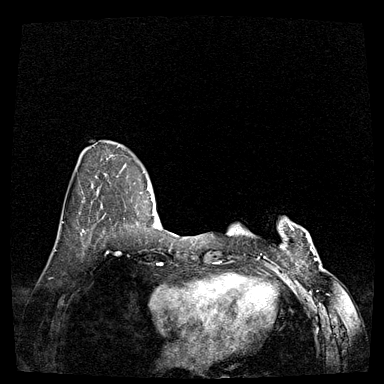
[im 58/144]
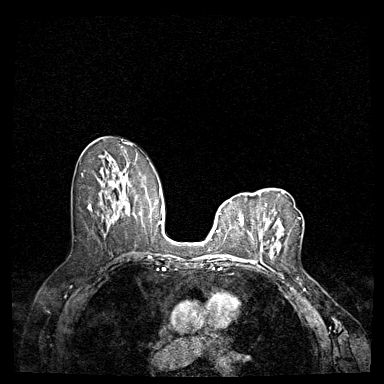
[im 86/144]
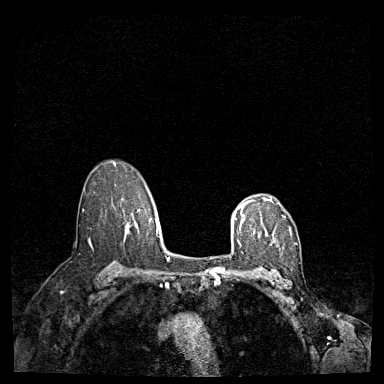
[im 115/144]
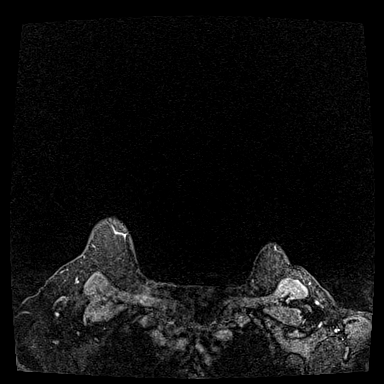
[im 144/144]
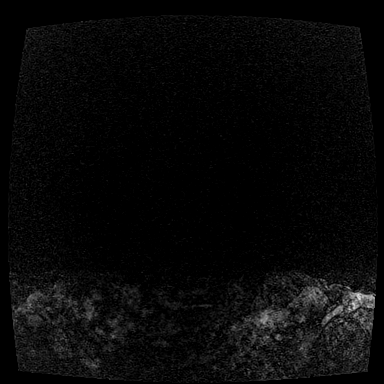

[Series 9: fl3d post-cm 3min_sub · axial · 1.2mm · 0.94mm/px · 1 of 144 slices shown]
[im 1/144]
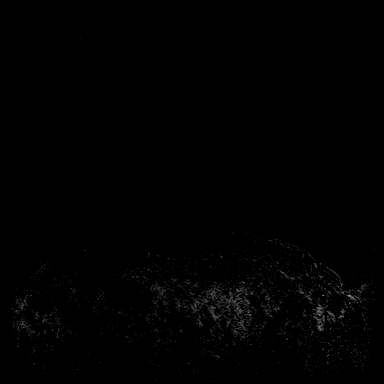

[29 of 48 positions shown; findings below may reference images not displayed]

Three-dimensional MR images were rendered by post-processing of the
original MR data on an independent workstation. The
three-dimensional MR images were interpreted, and findings are
reported in the following complete MRI report for this study. Three
dimensional images were evaluated at the independent interpreting
workstation using the DynaCAD thin client.
FINDINGS: Breast composition: c. Heterogeneous fibroglandular tissue.

Background parenchymal enhancement: Moderate.

Right breast: Irregular enhancing mass within the upper inner
quadrant of the RIGHT breast, at anterior depth, with suspicious
enhancement kinetics including washout, measuring 7 mm (series 6,
images 100-103).

There are 2 adjacent enhancing foci, slightly lateral to the
irregular enhancing mass described above, demonstrating
predominantly persistent enhancement kinetics. These would be
suspicious for small satellite masses if the irregular enhancing
mass was proven to be a malignancy by biopsy, but would not be
suspicious if the irregular enhancing mass was proven to be benign.

There are no new suspicious enhancing masses, non-mass enhancement
or secondary signs of malignancy elsewhere within the RIGHT breast.

Left breast: Again noted are postsurgical changes within the lower
inner quadrant of the LEFT breast, consistent with history of
lumpectomy.

Again noted is masslike enhancement within the skin, and just deep
to the skin, at the level of the LEFT nipple (series 6, images 100
through 105), stable in appearance and extent compared to the
earlier MRI of [DATE].

There are no new suspicious enhancing masses, non-mass enhancement
or secondary signs of malignancy elsewhere within the LEFT breast.

Lymph nodes: No abnormal appearing lymph nodes.

Ancillary findings:  None.
IMPRESSION: 1. Suspicious irregular enhancing mass within the upper inner
quadrant of the RIGHT breast, at anterior depth, measuring 7 mm
(series 6, images 100 through 103). MRI-guided biopsy is
recommended.
2. Two addition enhancing foci immediately adjacent
(posterior-lateral) to the irregular enhancing RIGHT breast mass
detailed above, suspicious for small satellite masses if the
irregular enhancing mass is proven to be a malignancy by biopsy, not
considered suspicious if the irregular enhancing mass is proven to
be benign. The most peripheral of these 2 enhancing foci is less
than 10 mm posterior-lateral to the irregular enhancing mass
recommended for biopsy.
3. Masslike enhancement within the skin, and just deep to the skin,
at the level of the LEFT nipple (series 6, images 100-105), stable
in appearance and extent compared to earlier MRI of [DATE].
There was no sonographic correlate in this area on ultrasound dated
[DATE], suggesting benign postsurgical change/fat necrosis.

RECOMMENDATION:
1. MRI-guided biopsy for the irregular enhancing mass within the
upper inner quadrant of the RIGHT breast, measuring 7 mm, as
detailed above.
2. If MRI-guided biopsy reveals a benign pathology, recommend
additional follow-up MRI in 6 months per protocol and to ensure
continued stability of the masslike enhancement within the skin, and
just deep to the skin, at the level of the LEFT nipple, possibly
benign postsurgical change/fat necrosis.

BI-RADS CATEGORY  4: Suspicious.

## 2021-01-04 MED ORDER — GADOBUTROL 1 MMOL/ML IV SOLN
7.0000 mL | Freq: Once | INTRAVENOUS | Status: AC | PRN
  Administered 2021-01-04: 7 mL via INTRAVENOUS

## 2021-01-08 ENCOUNTER — Other Ambulatory Visit: Payer: Self-pay | Admitting: Family Medicine

## 2021-01-08 DIAGNOSIS — R9389 Abnormal findings on diagnostic imaging of other specified body structures: Secondary | ICD-10-CM

## 2021-01-10 ENCOUNTER — Other Ambulatory Visit: Payer: Medicare HMO

## 2021-01-11 ENCOUNTER — Other Ambulatory Visit: Payer: Medicare HMO

## 2021-01-12 ENCOUNTER — Ambulatory Visit
Admission: RE | Admit: 2021-01-12 | Discharge: 2021-01-12 | Disposition: A | Discharge status: Home / Self Care | Payer: Medicare HMO | Source: Ambulatory Visit | Attending: Family Medicine | Admitting: Family Medicine

## 2021-01-12 ENCOUNTER — Other Ambulatory Visit (HOSPITAL_COMMUNITY): Payer: Self-pay | Admitting: Diagnostic Radiology

## 2021-01-12 ENCOUNTER — Other Ambulatory Visit: Payer: Self-pay

## 2021-01-12 DIAGNOSIS — R9389 Abnormal findings on diagnostic imaging of other specified body structures: Secondary | ICD-10-CM

## 2021-01-12 DIAGNOSIS — N6311 Unspecified lump in the right breast, upper outer quadrant: Secondary | ICD-10-CM | POA: Diagnosis not present

## 2021-01-12 DIAGNOSIS — N6489 Other specified disorders of breast: Secondary | ICD-10-CM | POA: Diagnosis not present

## 2021-01-12 IMAGING — MR MR BREAST BX W/ LOC DEV 1ST LEASION IMAGE BX SPEC MR GUIDE*R*
7 of 10 series · 33 of 48 positions shown · IV contrast (8 ml gadavist)
Comparison: Previous exams.
COMPARISON: Previous exams.

Addendum:
CLINICAL DATA: Focal enhancing mass in the anterior upper medial
right breast for biopsy.

EXAM:
MRI GUIDED CORE NEEDLE BIOPSY OF THE right BREAST
TECHNIQUE: Multiplanar, multisequence MR imaging of the right breast was
performed both before and after administration of intravenous
contrast.
CONTRAST:  8mL GADAVIST GADOBUTROL 1 MMOL/ML IV SOLN

[Series 2: fiducial unilateral · sagittal · 2.0mm · 1.33mm/px · 3 of 52 slices shown]
[im 1/52]
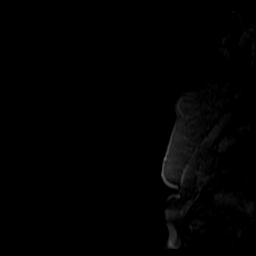
[im 26/52]
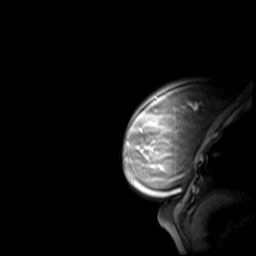
[im 52/52]
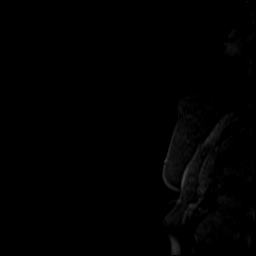

[Series 3: dynamic pre · axial · non-contrast · 1.3mm · 0.73mm/px · z∈[-83,+103]mm · 5 of 144 slices shown]
[im 1/144]
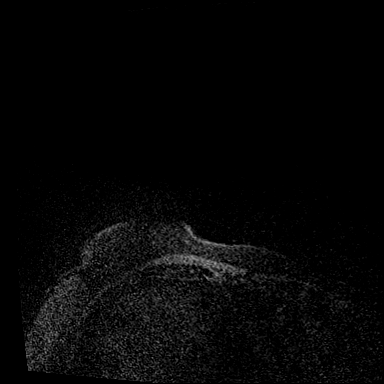
[im 36/144]
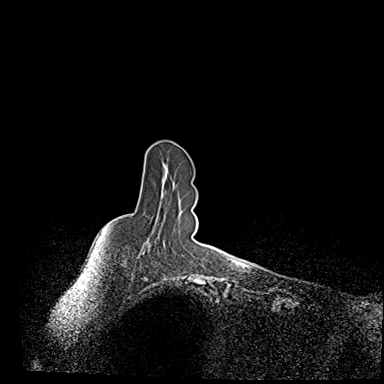
[im 72/144]
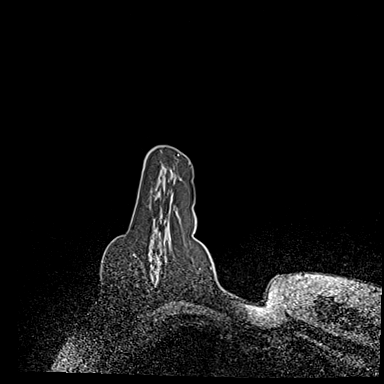
[im 108/144]
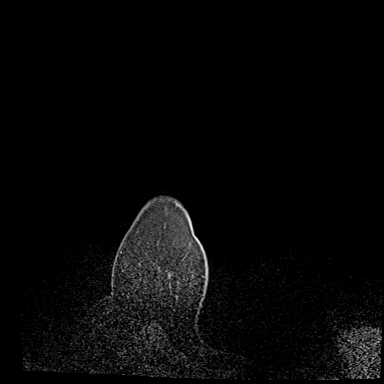
[im 144/144]
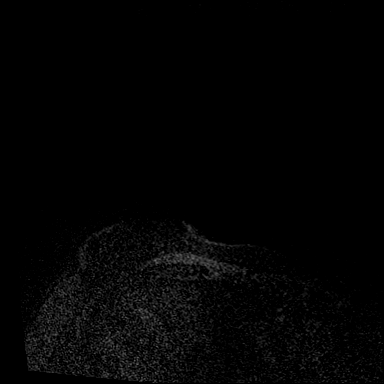

[Series 4: dynamic post 20 · axial · 1.3mm · 0.73mm/px · z∈[-83,+103]mm · 5 of 144 slices shown (1 of 2)]
[im 1/144]
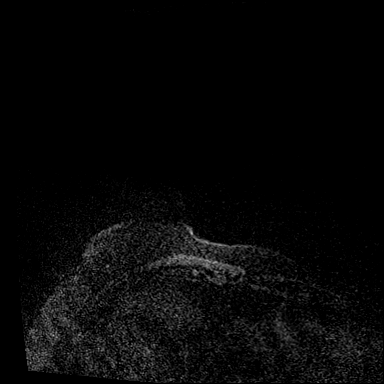
[im 36/144]
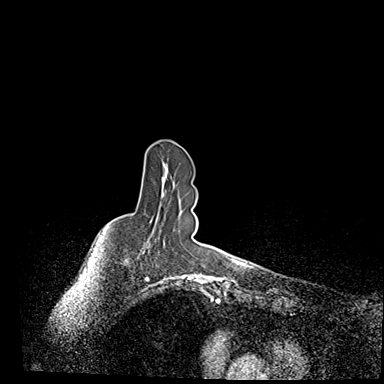
[im 72/144]
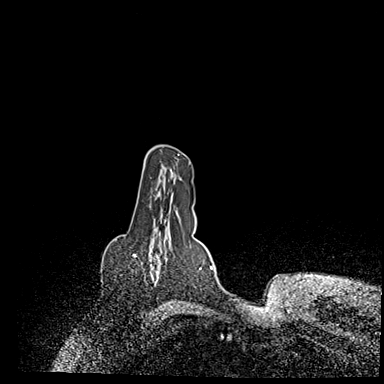
[im 108/144]
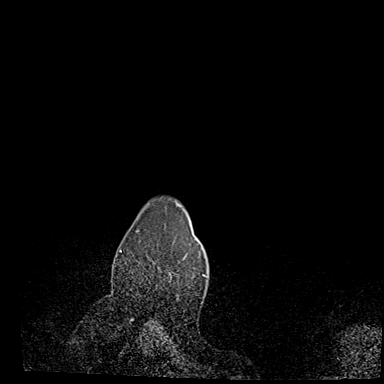
[im 144/144]
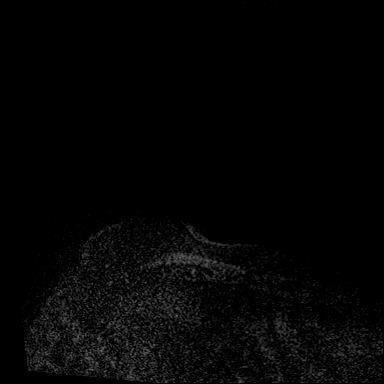

[Series 5: dynamic post 20 · axial · 1.3mm · 0.73mm/px · z∈[-83,+103]mm · 5 of 144 slices shown (2 of 2)]
[im 1/144]
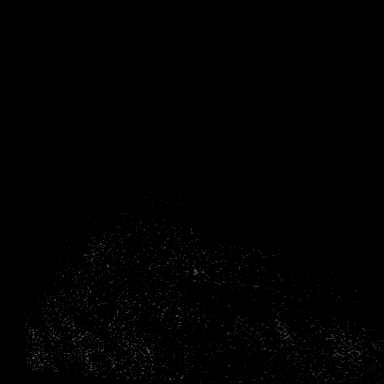
[im 36/144]
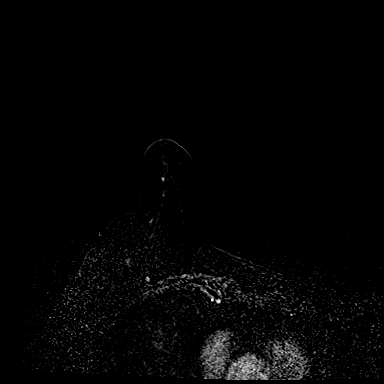
[im 72/144]
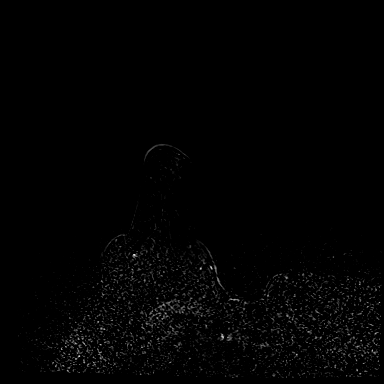
[im 108/144]
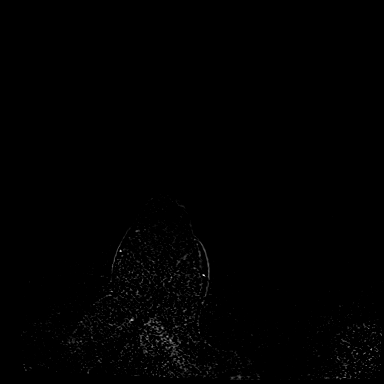
[im 144/144]
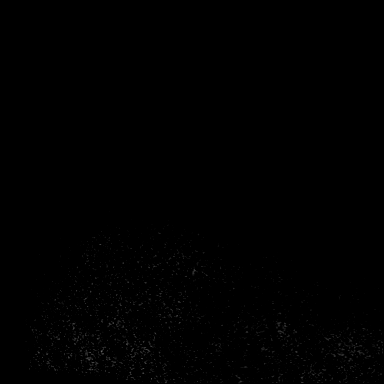

[Series 6: dynamic post 3 · axial · 1.3mm · 0.73mm/px · z∈[-83,+103]mm · 5 of 144 slices shown (1 of 2)]
[im 1/144]
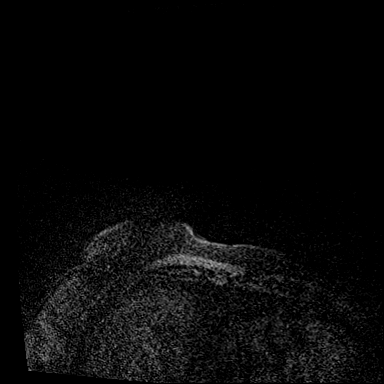
[im 36/144]
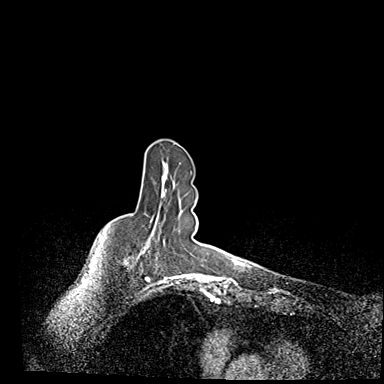
[im 72/144]
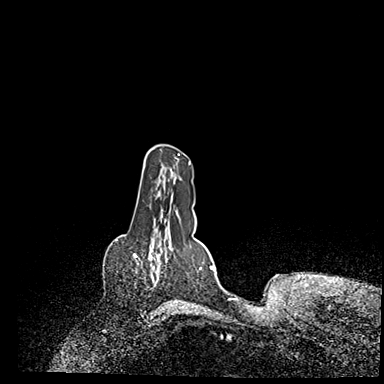
[im 108/144]
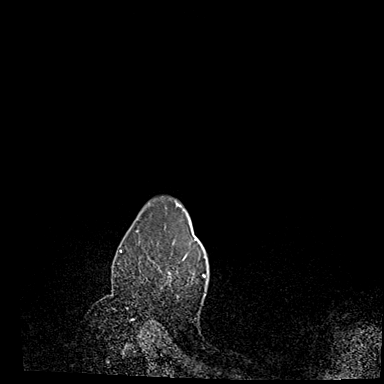
[im 144/144]
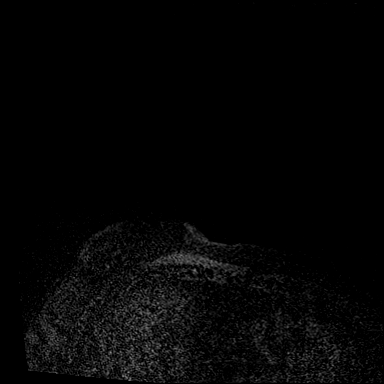

[Series 7: dynamic post 3 · axial · 1.3mm · 0.73mm/px · z∈[-83,+103]mm · 5 of 144 slices shown (2 of 2)]
[im 1/144]
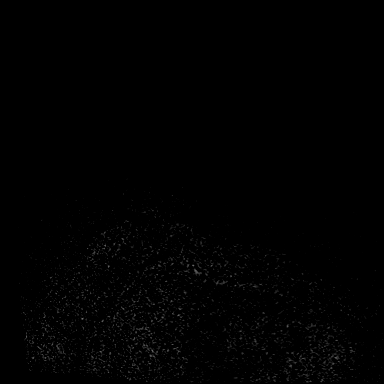
[im 36/144]
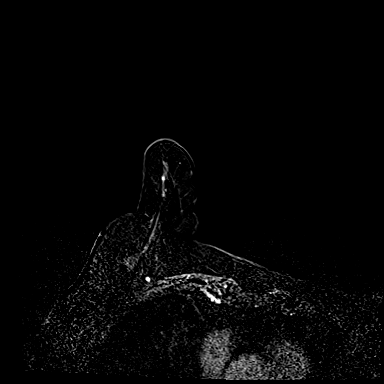
[im 72/144]
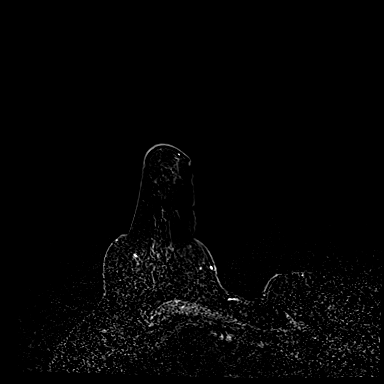
[im 108/144]
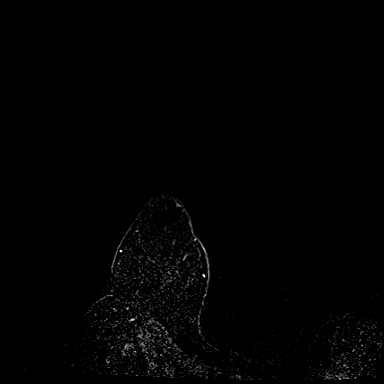
[im 144/144]
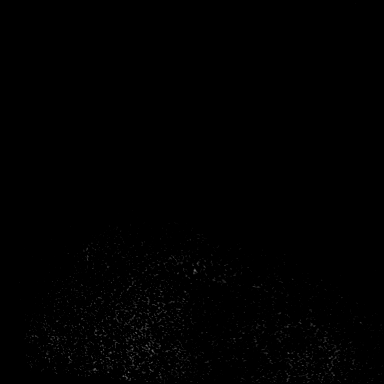

[Series 8: needle confirmation · axial · 1.3mm · 0.73mm/px · z∈[-83,+103]mm · 5 of 144 slices shown]
[im 1/144]
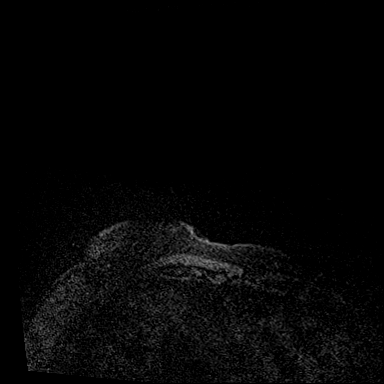
[im 36/144]
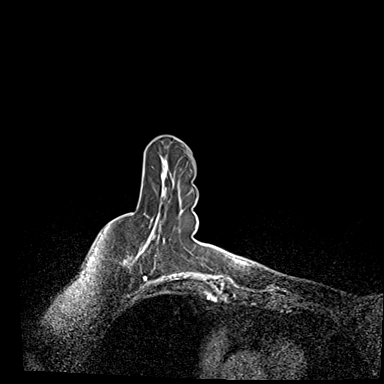
[im 72/144]
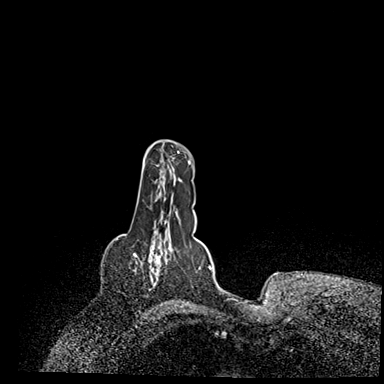
[im 108/144]
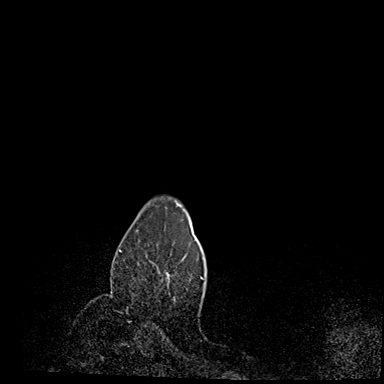
[im 144/144]
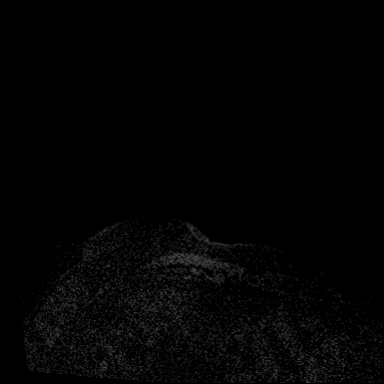

[33 of 48 positions shown; findings below may reference images not displayed]

FINDINGS: I met with the patient, and we discussed the procedure of MRI guided
biopsy, including risks, benefits, and alternatives. Specifically,
we discussed the risks of infection, bleeding, tissue injury, clip
migration, and inadequate sampling. Informed, written consent was
given. The usual time out protocol was performed immediately prior
to the procedure.

Using sterile technique, 1% Lidocaine, MRI guidance, and a 9 gauge
vacuum assisted device, biopsy was performed of mass in the medial
anterior upper right breast using a medial approach. At the
conclusion of the procedure, a cylinder tissue marker clip was
deployed into the biopsy cavity. Follow-up 2-view mammogram was
performed and dictated separately.
IMPRESSION: MRI guided biopsy of right breast.  No apparent complications.

ADDENDUM:
Pathology revealed COMPLEX SCLEROSING LESION of the Right breast,
anterior upper inner quadrant. This was found to be concordant by
Dr. MOATSHE, with surgical consultation for consideration of
excision recommended.

Pathology results were discussed with the patient by telephone. The
patient reported doing well after the biopsy with tenderness and
bruising at the site. Post biopsy instructions and care were
reviewed and questions were answered. The patient was encouraged to
call The [REDACTED] for any additional
concerns. My direct phone number was provided.

Surgical consultation has been arranged with Dr. MOATSHE at
[REDACTED] on [DATE].

The patient was instructed to return for a bilateral breast MRI in 6
months per protocol, and to ensure continued stability of the
masslike enhancement within the skin, and just deep to the skin, at
the level of the LEFT nipple, possibly

benign postsurgical change/fat necrosis.

Pathology results reported by MOATSHE, RN on [DATE].

*** End of Addendum ***
FINDINGS: I met with the patient, and we discussed the procedure of MRI guided
biopsy, including risks, benefits, and alternatives. Specifically,
we discussed the risks of infection, bleeding, tissue injury, clip
migration, and inadequate sampling. Informed, written consent was
given. The usual time out protocol was performed immediately prior
to the procedure.

Using sterile technique, 1% Lidocaine, MRI guidance, and a 9 gauge
vacuum assisted device, biopsy was performed of mass in the medial
anterior upper right breast using a medial approach. At the
conclusion of the procedure, a cylinder tissue marker clip was
deployed into the biopsy cavity. Follow-up 2-view mammogram was
performed and dictated separately.
IMPRESSION: MRI guided biopsy of right breast.  No apparent complications.

## 2021-01-12 IMAGING — MG MM BREAST LOCALIZATION CLIP
4 series · 4 of 12 positions shown · non-contrast
Comparison: Prior films

CLINICAL DATA: Status post MRI guided core biopsy focal enhancement
right breast.

EXAM:
DIAGNOSTIC right MAMMOGRAM POST MRI BIOPSY

[R ML synth-2D]
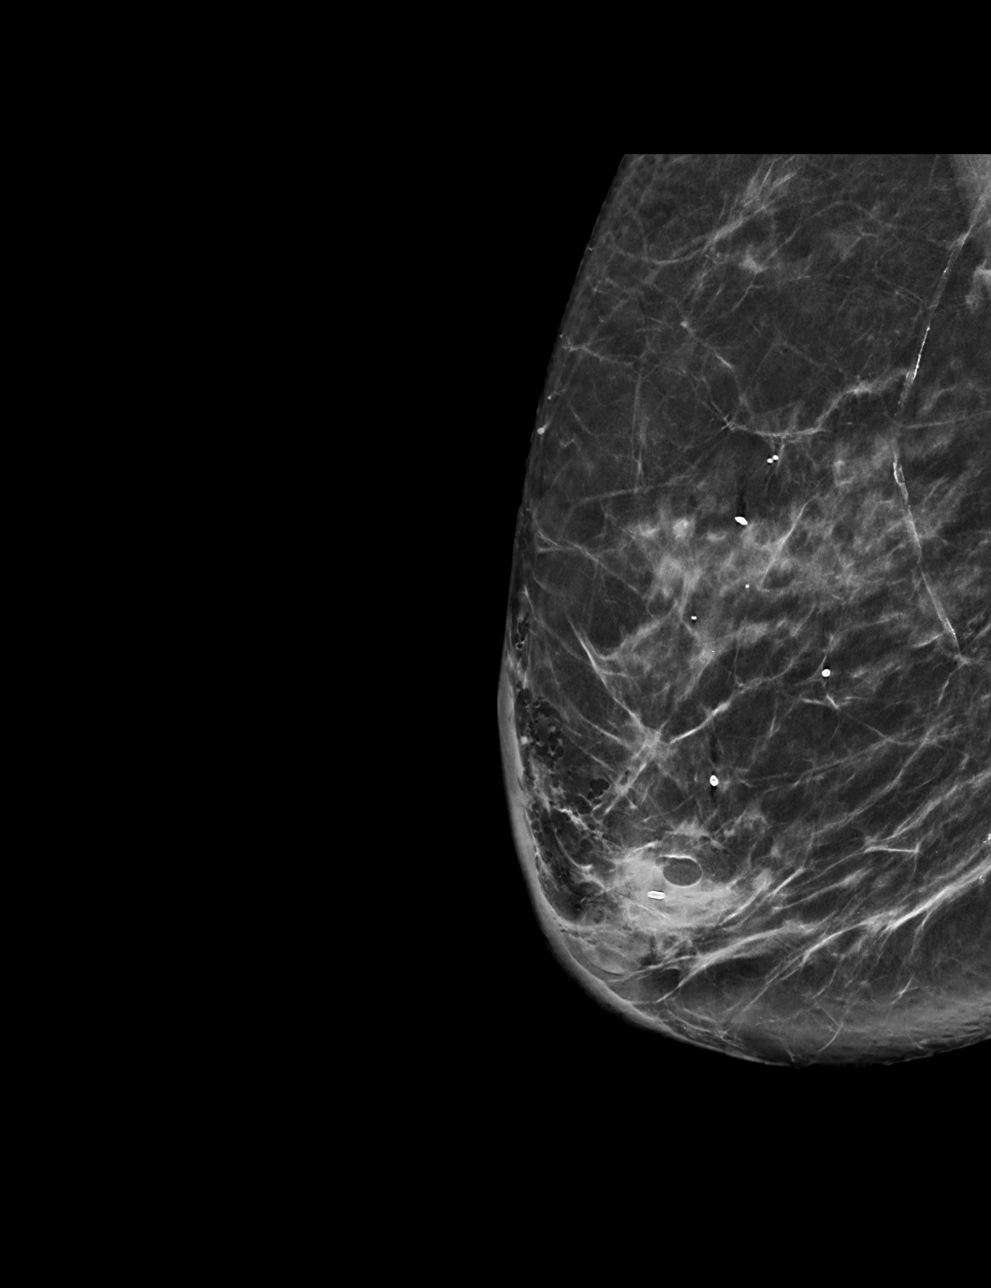

[R CC synth-2D]
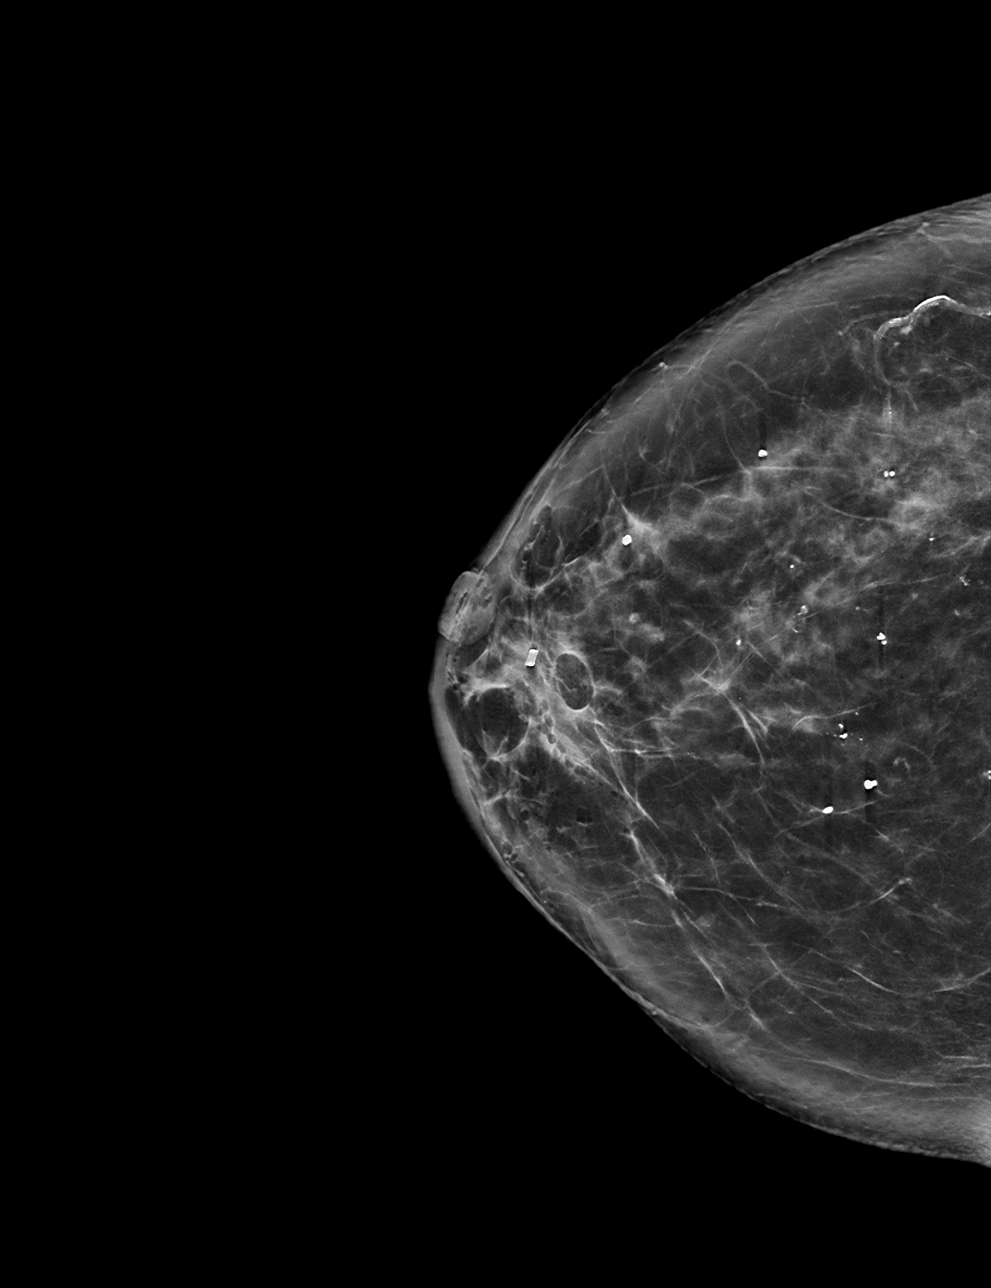

[R ML tomo · tomo slice 36/71.0]
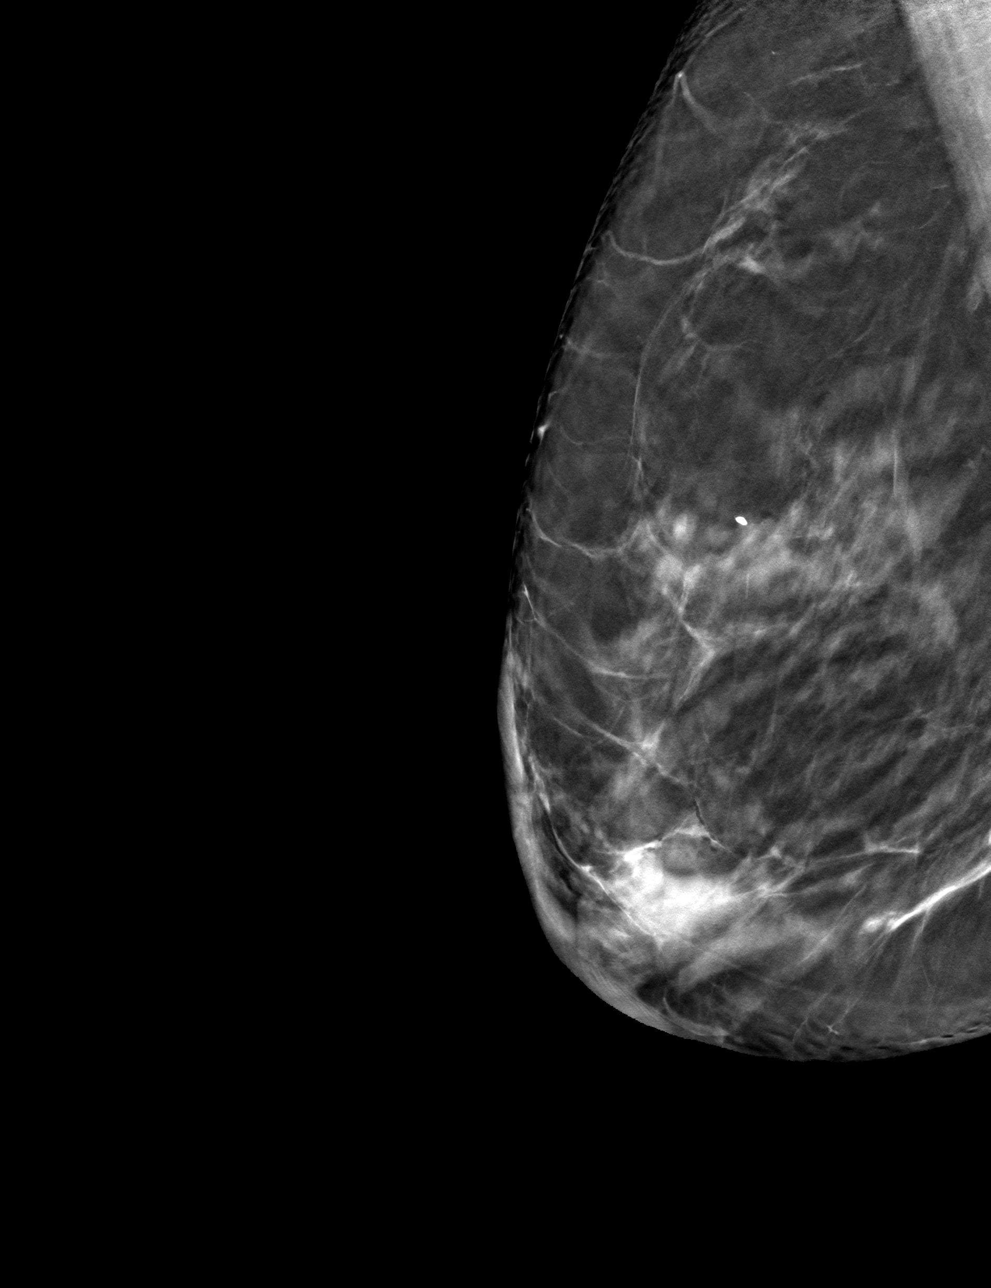

[R CC tomo · tomo slice 39/76.0]
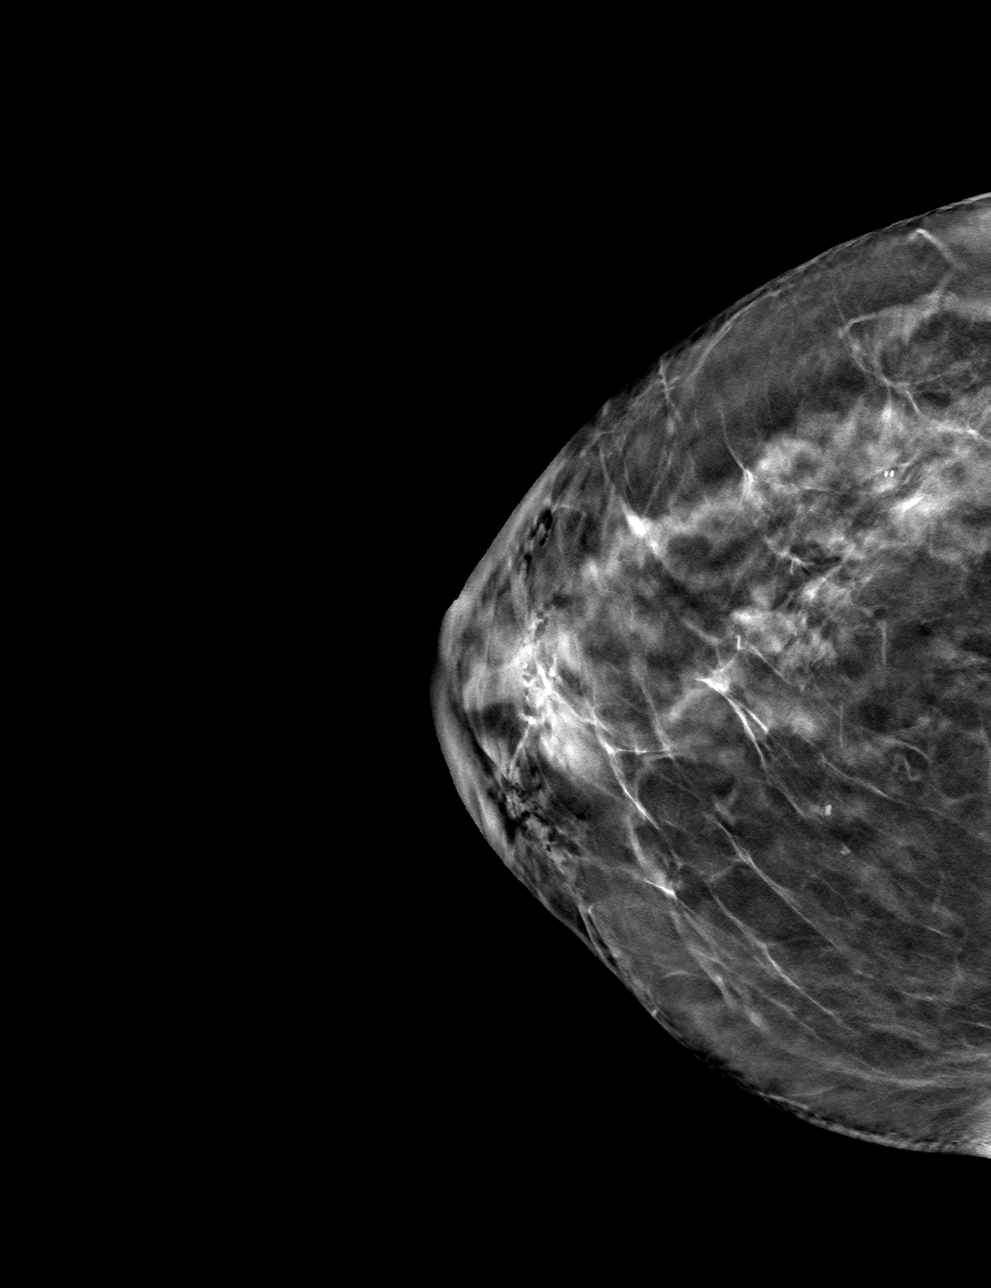

[4 of 12 positions shown; findings below may reference images not displayed]

FINDINGS: Mammographic images were obtained following MRI guided biopsy of
focal enhancement in the anteromedial upper right breast. The biopsy
marking clip is in expected position at the site of biopsy.
IMPRESSION: Appropriate positioning of the cylinder shaped biopsy marking clip
at the site of biopsy in the expected location of concern.

Final Assessment: Post Procedure Mammograms for Marker Placement

## 2021-01-12 MED ORDER — GADOBUTROL 1 MMOL/ML IV SOLN
8.0000 mL | Freq: Once | INTRAVENOUS | Status: AC | PRN
  Administered 2021-01-12: 8 mL via INTRAVENOUS

## 2021-01-22 ENCOUNTER — Encounter: Payer: Medicare HMO | Admitting: Family Medicine

## 2021-01-22 ENCOUNTER — Telehealth: Payer: Self-pay

## 2021-01-22 ENCOUNTER — Inpatient Hospital Stay: Payer: Medicare HMO | Admitting: Hematology & Oncology

## 2021-01-22 ENCOUNTER — Inpatient Hospital Stay: Payer: Medicare HMO | Attending: Family Medicine

## 2021-01-22 NOTE — Telephone Encounter (Signed)
Pt called in to make a new appt as she slept through her appt time today.  A new appt made for mid morning   Michelle Long

## 2021-02-12 DIAGNOSIS — F250 Schizoaffective disorder, bipolar type: Secondary | ICD-10-CM | POA: Diagnosis not present

## 2021-02-13 ENCOUNTER — Ambulatory Visit: Payer: Self-pay | Admitting: General Surgery

## 2021-02-13 DIAGNOSIS — N6021 Fibroadenosis of right breast: Secondary | ICD-10-CM | POA: Diagnosis not present

## 2021-02-16 ENCOUNTER — Other Ambulatory Visit: Payer: Self-pay | Admitting: General Surgery

## 2021-02-16 DIAGNOSIS — N6021 Fibroadenosis of right breast: Secondary | ICD-10-CM

## 2021-03-01 ENCOUNTER — Encounter: Payer: Medicare HMO | Admitting: Family Medicine

## 2021-03-02 ENCOUNTER — Other Ambulatory Visit: Payer: Self-pay

## 2021-03-02 ENCOUNTER — Encounter (HOSPITAL_BASED_OUTPATIENT_CLINIC_OR_DEPARTMENT_OTHER): Payer: Self-pay | Admitting: General Surgery

## 2021-03-06 ENCOUNTER — Other Ambulatory Visit (HOSPITAL_COMMUNITY): Payer: Medicare HMO

## 2021-03-08 ENCOUNTER — Ambulatory Visit
Admission: RE | Admit: 2021-03-08 | Discharge: 2021-03-08 | Disposition: A | Discharge status: Home / Self Care | Payer: Medicare HMO | Source: Ambulatory Visit | Attending: General Surgery | Admitting: General Surgery

## 2021-03-08 ENCOUNTER — Encounter (HOSPITAL_BASED_OUTPATIENT_CLINIC_OR_DEPARTMENT_OTHER)
Admission: RE | Admit: 2021-03-08 | Discharge: 2021-03-08 | Disposition: A | Discharge status: Home / Self Care | Payer: Medicare HMO | Source: Ambulatory Visit | Attending: General Surgery | Admitting: General Surgery

## 2021-03-08 ENCOUNTER — Other Ambulatory Visit: Payer: Self-pay

## 2021-03-08 DIAGNOSIS — R928 Other abnormal and inconclusive findings on diagnostic imaging of breast: Secondary | ICD-10-CM | POA: Diagnosis not present

## 2021-03-08 DIAGNOSIS — Z01812 Encounter for preprocedural laboratory examination: Secondary | ICD-10-CM | POA: Diagnosis not present

## 2021-03-08 DIAGNOSIS — N6021 Fibroadenosis of right breast: Secondary | ICD-10-CM

## 2021-03-08 LAB — BASIC METABOLIC PANEL
Anion gap: 8 (ref 5–15)
BUN: 8 mg/dL (ref 8–23)
CO2: 26 mmol/L (ref 22–32)
Calcium: 9.1 mg/dL (ref 8.9–10.3)
Chloride: 106 mmol/L (ref 98–111)
Creatinine, Ser: 0.7 mg/dL (ref 0.44–1.00)
GFR, Estimated: >60 mL/min (ref >60)
Glucose, Bld: 136 mg/dL — ABNORMAL HIGH (ref 70–99)
Potassium: 4.8 mmol/L (ref 3.5–5.1)
Sodium: 140 mmol/L (ref 135–145)

## 2021-03-08 IMAGING — MG MM PLC BREAST LOC DEV 1ST LESION INC*R*
8 of 9 series · 8 of 9 positions shown · non-contrast
Comparison: Previous exam(s).

CLINICAL DATA: 74-year-old female with recently diagnosed complex
sclerosing lesion at site of cylindrical shaped biopsy marking clip
in the anteromedial right breast.

EXAM:
MAMMOGRAPHIC GUIDED RADIOACTIVE SEED LOCALIZATION OF THE RIGHT
BREAST

[R ML (1 of 2)]
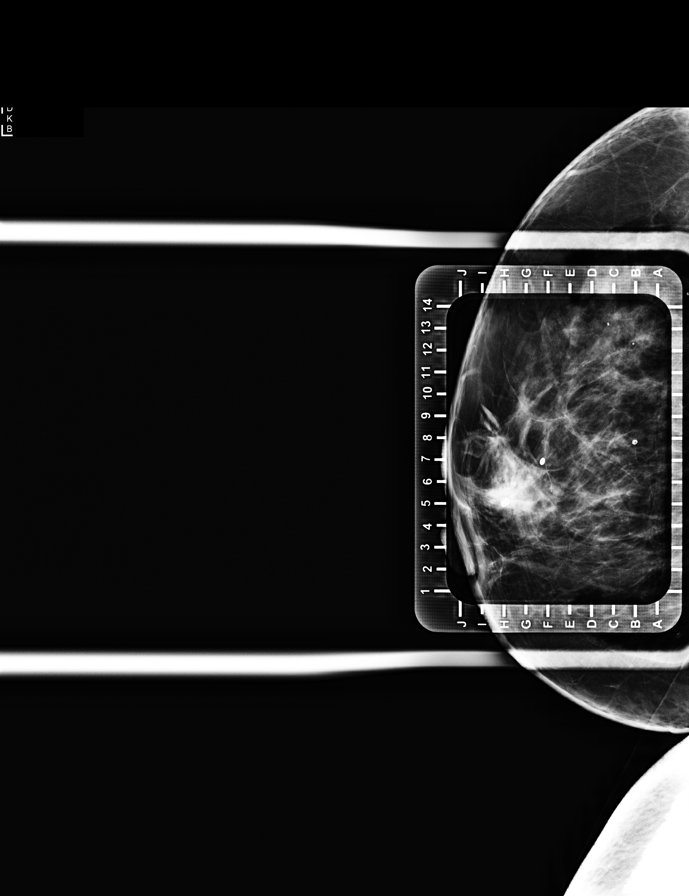

[R CC (1 of 6)]
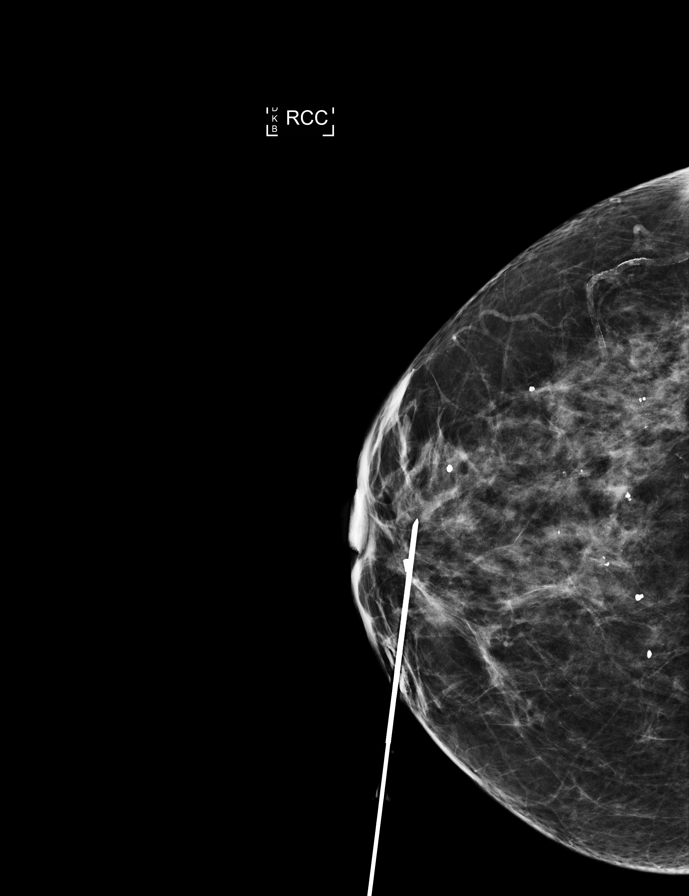

[R CC (2 of 6)]
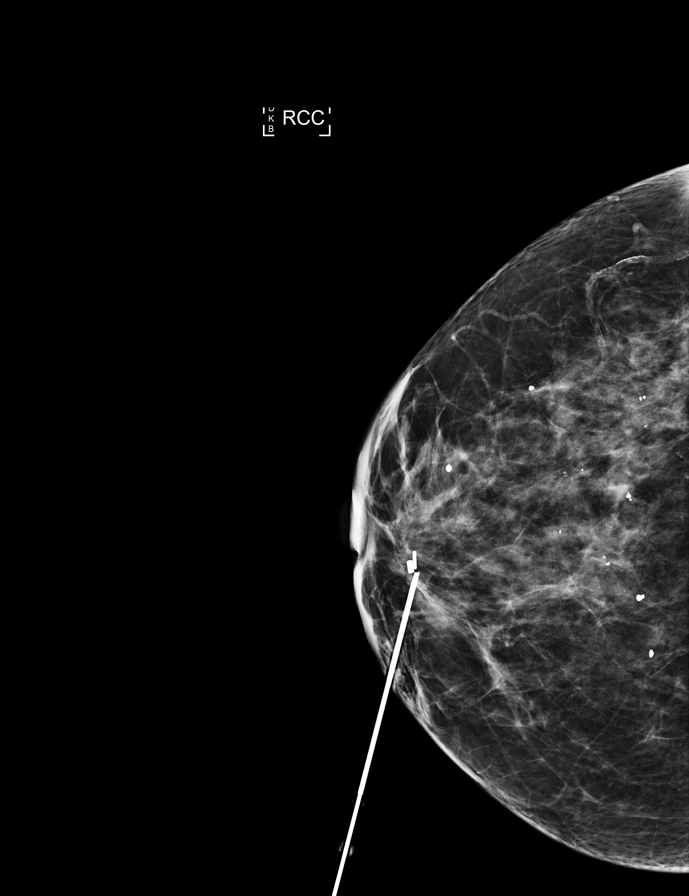

[R CC (3 of 6)]
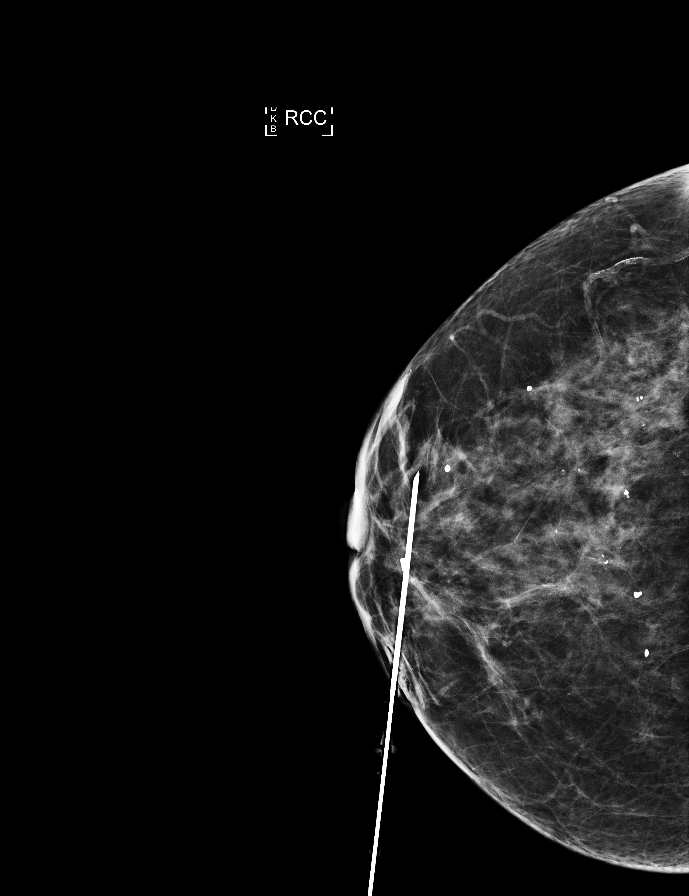

[R CC (4 of 6)]
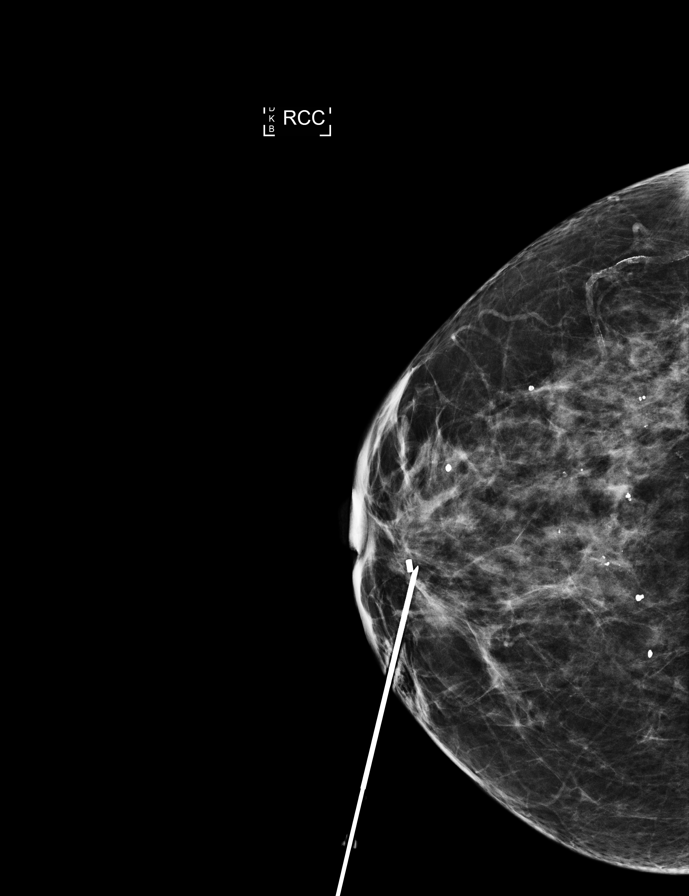

[R CC (5 of 6)]
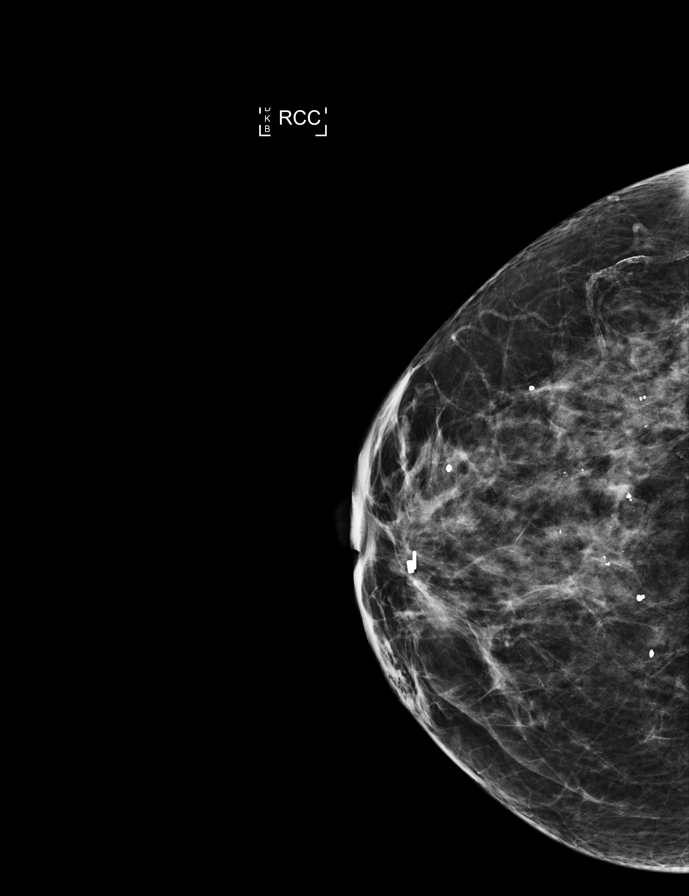

[R ML (2 of 2)]
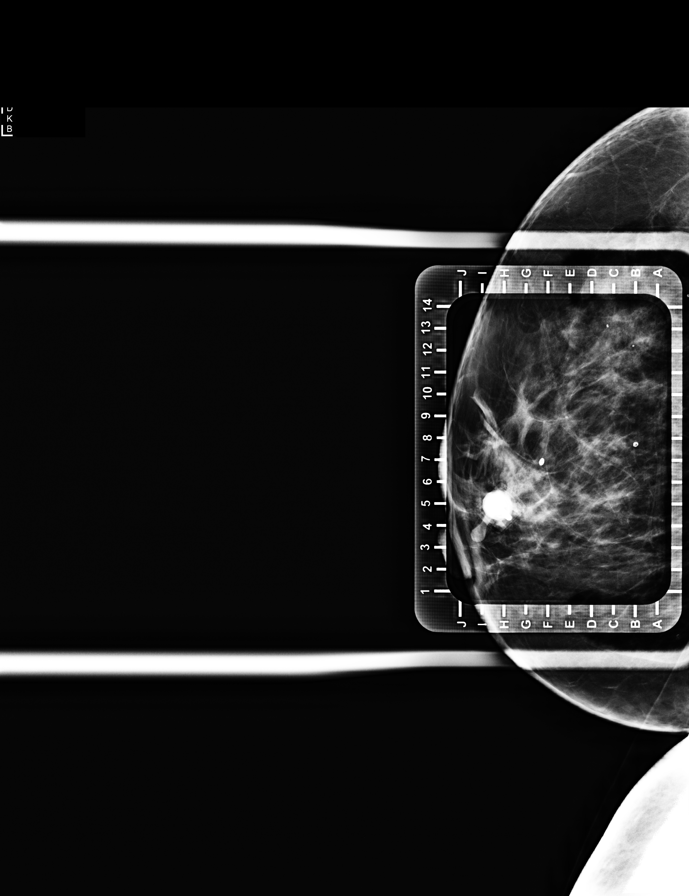

[R CC (6 of 6)]
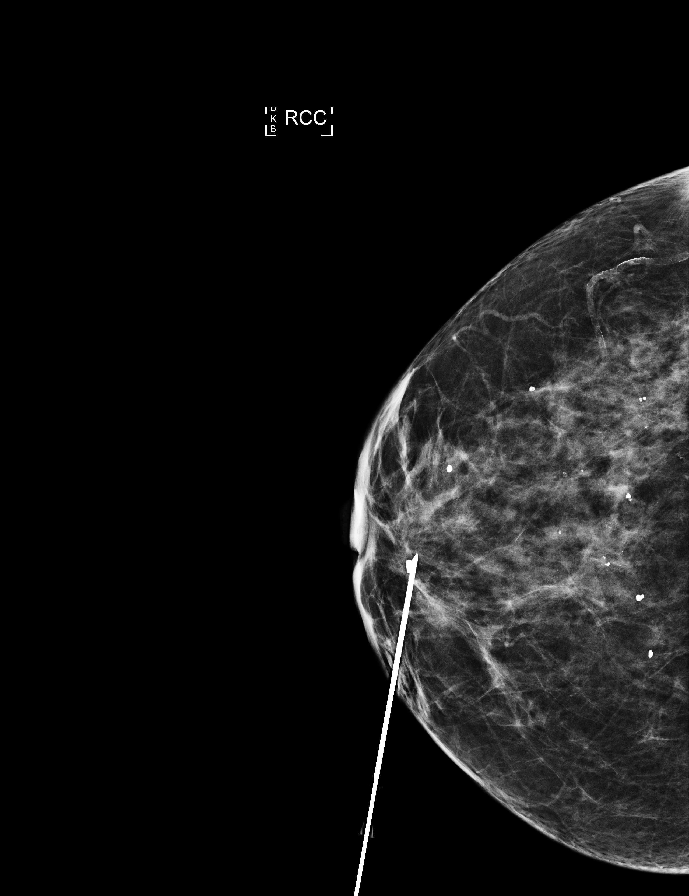

[8 of 9 positions shown; findings below may reference images not displayed]

FINDINGS: Patient presents for radioactive seed localization prior to right
breast excision. I met with the patient and we discussed the
procedure of seed localization including benefits and alternatives.
We discussed the high likelihood of a successful procedure. We
discussed the risks of the procedure including infection, bleeding,
tissue injury and further surgery. We discussed the low dose of
radioactivity involved in the procedure. Informed, written consent
was given.

The usual time-out protocol was performed immediately prior to the
procedure.

Using mammographic guidance, sterile technique, 1% lidocaine and an
[31] radioactive seed, the cylindrical shaped biopsy marking clip
in the anteromedial right breast was localized using a medial to
lateral approach. The follow-up mammogram images confirm the seed in
the expected location and were marked for Dr. NADORI.

Follow-up survey of the patient confirms presence of the radioactive
seed.

Order number of [31] seed:  [PHONE_NUMBER].

Total activity:  0.254 millicuries reference Date: [DATE]

The patient tolerated the procedure well and was released from the
[REDACTED]. She was given instructions regarding seed removal.
IMPRESSION: Radioactive seed localization right breast. No apparent
complications.

## 2021-03-08 NOTE — Progress Notes (Signed)

## 2021-03-09 ENCOUNTER — Ambulatory Visit (HOSPITAL_BASED_OUTPATIENT_CLINIC_OR_DEPARTMENT_OTHER)
Admission: RE | Admit: 2021-03-09 | Discharge: 2021-03-09 | Disposition: A | Discharge status: Home / Self Care | Payer: Medicare HMO | Attending: General Surgery | Admitting: General Surgery

## 2021-03-09 ENCOUNTER — Encounter (HOSPITAL_BASED_OUTPATIENT_CLINIC_OR_DEPARTMENT_OTHER)
Admission: RE | Disposition: A | Discharge status: Home / Self Care | Payer: Self-pay | Source: Home / Self Care | Attending: General Surgery

## 2021-03-09 ENCOUNTER — Encounter (HOSPITAL_BASED_OUTPATIENT_CLINIC_OR_DEPARTMENT_OTHER): Payer: Self-pay | Admitting: General Surgery

## 2021-03-09 ENCOUNTER — Ambulatory Visit (HOSPITAL_BASED_OUTPATIENT_CLINIC_OR_DEPARTMENT_OTHER): Payer: Medicare HMO | Admitting: Certified Registered"

## 2021-03-09 ENCOUNTER — Other Ambulatory Visit: Payer: Self-pay

## 2021-03-09 ENCOUNTER — Ambulatory Visit
Admission: RE | Admit: 2021-03-09 | Discharge: 2021-03-09 | Disposition: A | Discharge status: Home / Self Care | Payer: Medicare HMO | Source: Ambulatory Visit | Attending: General Surgery | Admitting: General Surgery

## 2021-03-09 DIAGNOSIS — N6011 Diffuse cystic mastopathy of right breast: Secondary | ICD-10-CM | POA: Diagnosis not present

## 2021-03-09 DIAGNOSIS — N6091 Unspecified benign mammary dysplasia of right breast: Secondary | ICD-10-CM | POA: Diagnosis not present

## 2021-03-09 DIAGNOSIS — Z853 Personal history of malignant neoplasm of breast: Secondary | ICD-10-CM | POA: Diagnosis not present

## 2021-03-09 DIAGNOSIS — Z79899 Other long term (current) drug therapy: Secondary | ICD-10-CM | POA: Diagnosis not present

## 2021-03-09 DIAGNOSIS — Z792 Long term (current) use of antibiotics: Secondary | ICD-10-CM | POA: Insufficient documentation

## 2021-03-09 DIAGNOSIS — Z7951 Long term (current) use of inhaled steroids: Secondary | ICD-10-CM | POA: Insufficient documentation

## 2021-03-09 DIAGNOSIS — Z7984 Long term (current) use of oral hypoglycemic drugs: Secondary | ICD-10-CM | POA: Insufficient documentation

## 2021-03-09 DIAGNOSIS — Z923 Personal history of irradiation: Secondary | ICD-10-CM | POA: Diagnosis not present

## 2021-03-09 DIAGNOSIS — I10 Essential (primary) hypertension: Secondary | ICD-10-CM | POA: Diagnosis not present

## 2021-03-09 DIAGNOSIS — N6489 Other specified disorders of breast: Secondary | ICD-10-CM | POA: Insufficient documentation

## 2021-03-09 DIAGNOSIS — E559 Vitamin D deficiency, unspecified: Secondary | ICD-10-CM | POA: Diagnosis not present

## 2021-03-09 DIAGNOSIS — Z87891 Personal history of nicotine dependence: Secondary | ICD-10-CM | POA: Diagnosis not present

## 2021-03-09 DIAGNOSIS — Z9221 Personal history of antineoplastic chemotherapy: Secondary | ICD-10-CM | POA: Diagnosis not present

## 2021-03-09 DIAGNOSIS — N6021 Fibroadenosis of right breast: Secondary | ICD-10-CM

## 2021-03-09 DIAGNOSIS — F418 Other specified anxiety disorders: Secondary | ICD-10-CM | POA: Diagnosis not present

## 2021-03-09 DIAGNOSIS — Z7952 Long term (current) use of systemic steroids: Secondary | ICD-10-CM | POA: Diagnosis not present

## 2021-03-09 DIAGNOSIS — Z882 Allergy status to sulfonamides status: Secondary | ICD-10-CM | POA: Diagnosis not present

## 2021-03-09 DIAGNOSIS — R928 Other abnormal and inconclusive findings on diagnostic imaging of breast: Secondary | ICD-10-CM | POA: Diagnosis not present

## 2021-03-09 HISTORY — DX: Anxiety disorder, unspecified: F41.9

## 2021-03-09 HISTORY — DX: Other specified postprocedural states: Z98.890

## 2021-03-09 HISTORY — PX: BREAST LUMPECTOMY WITH RADIOACTIVE SEED LOCALIZATION: SHX6424

## 2021-03-09 HISTORY — DX: Nausea with vomiting, unspecified: R11.2

## 2021-03-09 HISTORY — DX: Chronic obstructive pulmonary disease, unspecified: J44.9

## 2021-03-09 HISTORY — DX: Essential (primary) hypertension: I10

## 2021-03-09 LAB — GLUCOSE, CAPILLARY
Glucose-Capillary: 137 mg/dL — ABNORMAL HIGH (ref 70–99)
Glucose-Capillary: 161 mg/dL — ABNORMAL HIGH (ref 70–99)

## 2021-03-09 IMAGING — DX MM BREAST SURGICAL SPECIMEN
1 series · 2 of 2 positions shown · non-contrast
Comparison: Previous exam(s).

CLINICAL DATA: Evaluate surgical specimen following excision of
RIGHT breast ENTREMESES.

EXAM:
SPECIMEN RADIOGRAPH OF THE RIGHT BREAST

[Series 1: specimen digital x-ray · right · 0.07mm/px · 2 of 2 slices shown]
[im 1/2]
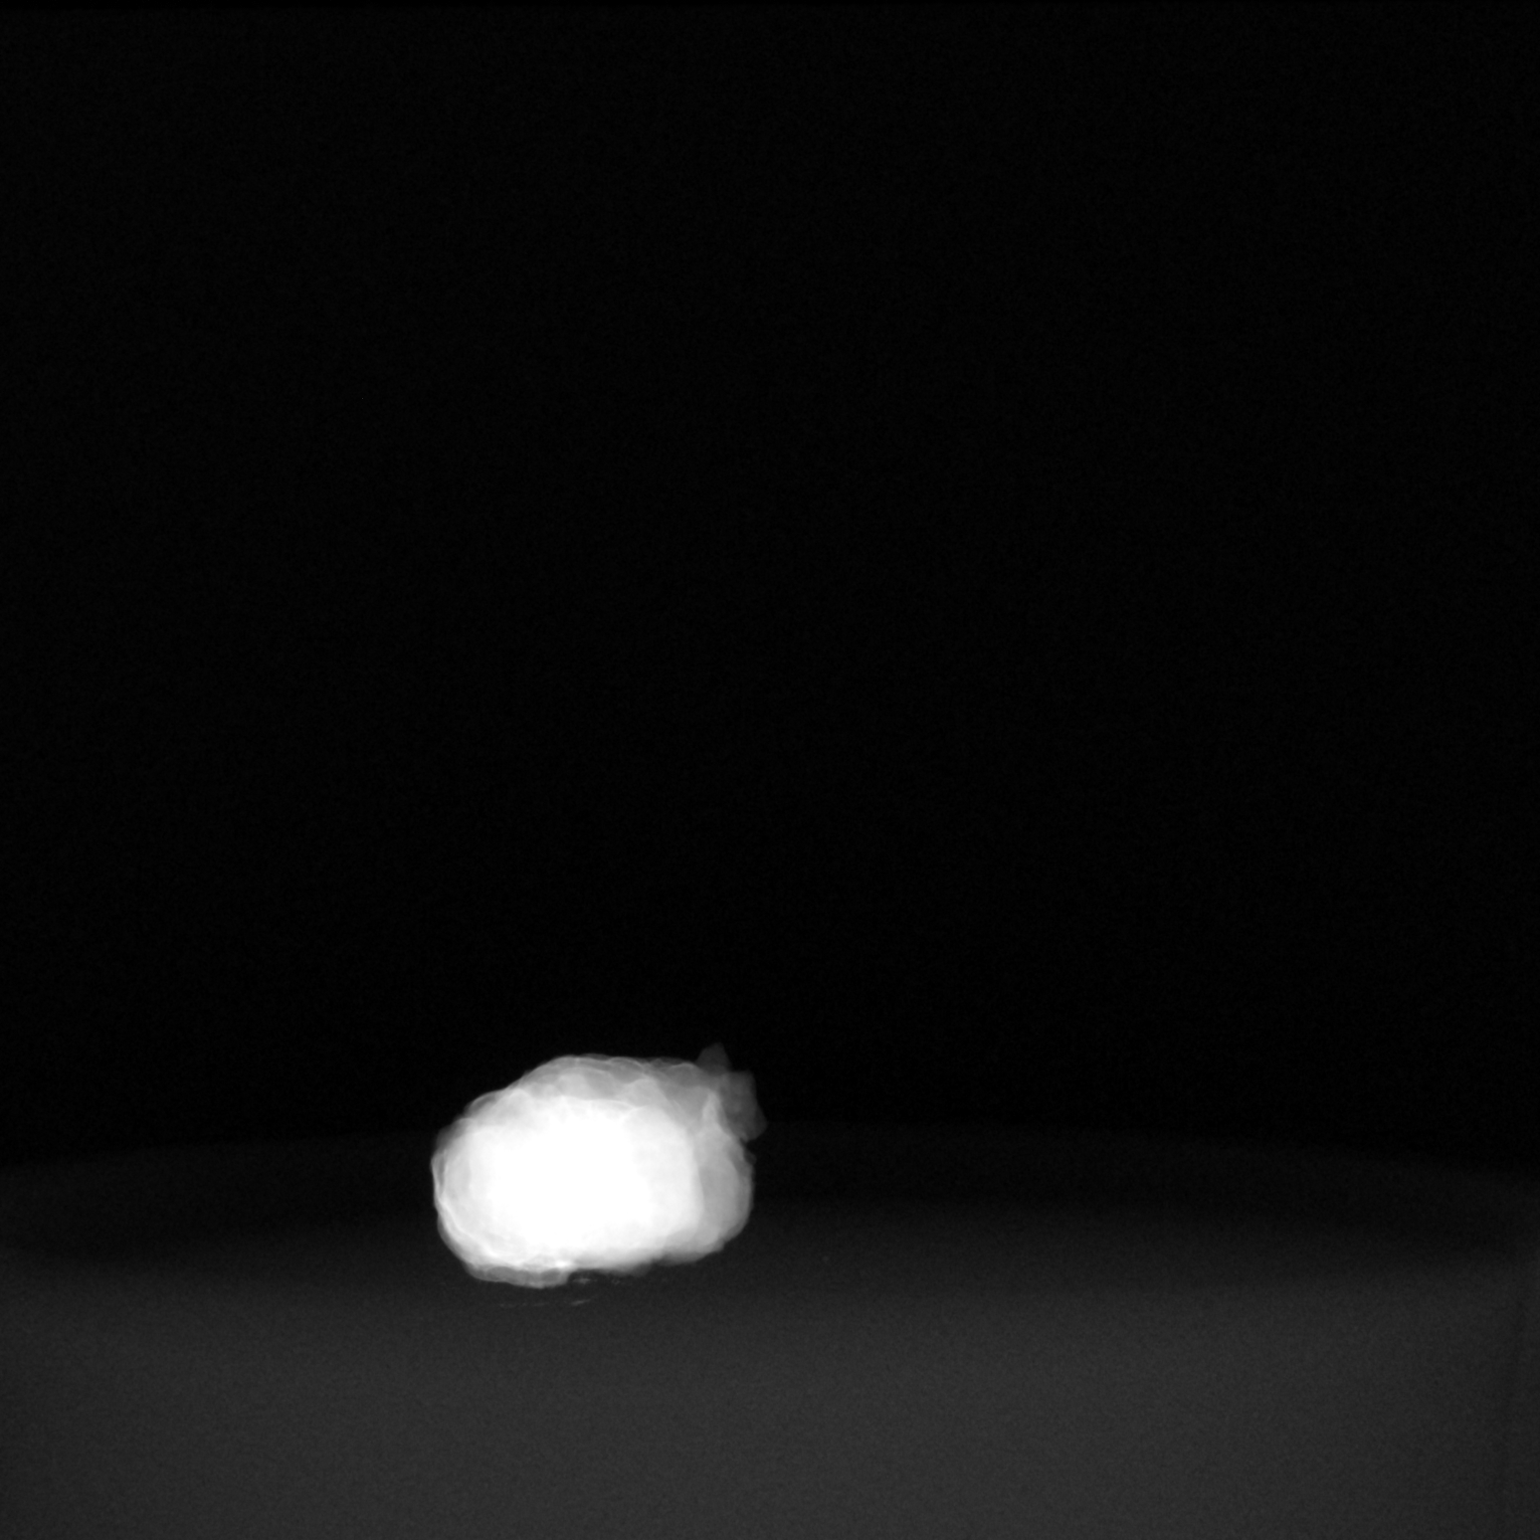
[im 2/2]
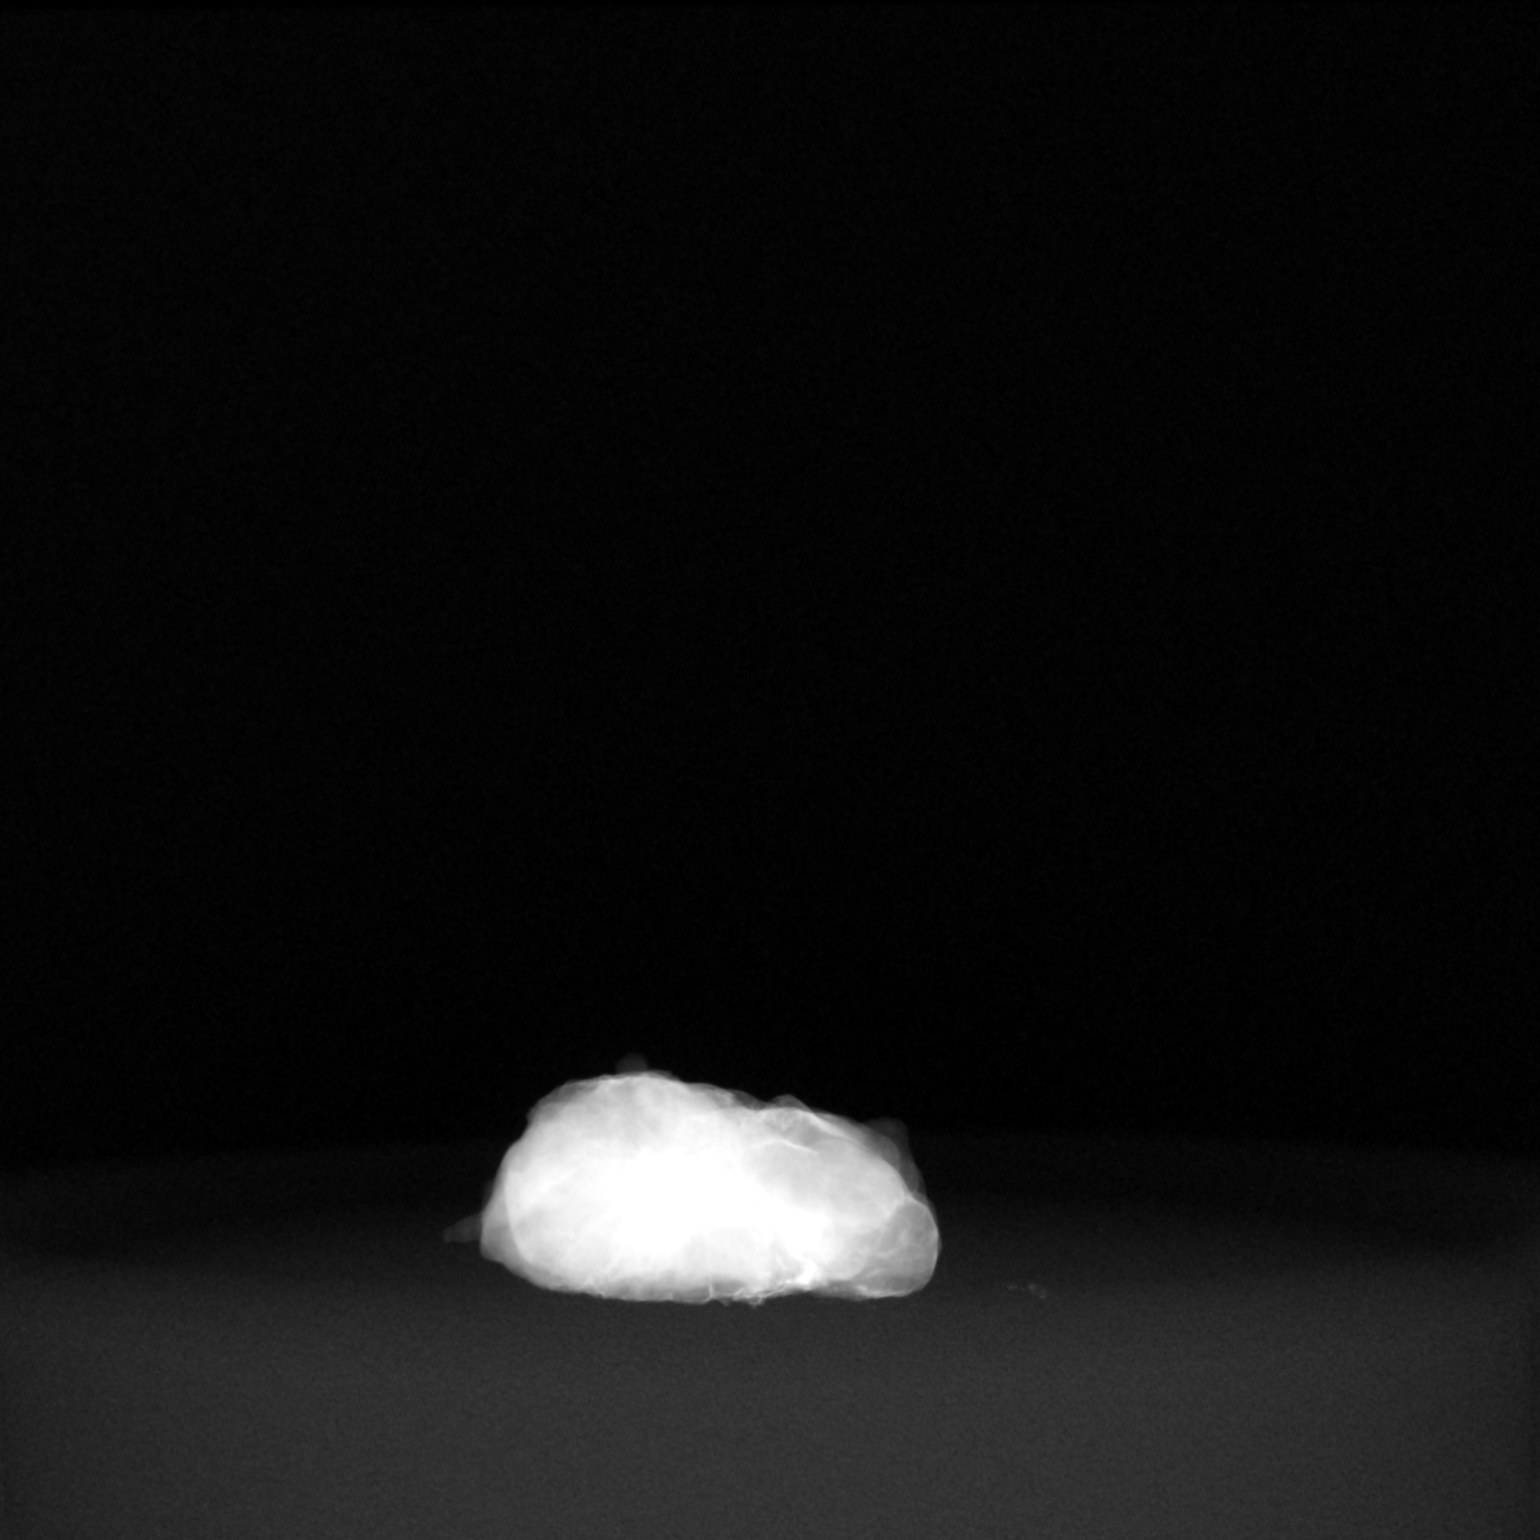

[2 of 2 positions shown; findings below may reference images not displayed]

FINDINGS: Status post excision of the RIGHT breast. The radioactive seed and
CYLINDER biopsy marker clip are present and completely intact.
IMPRESSION: Specimen radiograph of the RIGHT breast.

## 2021-03-09 SURGERY — BREAST LUMPECTOMY WITH RADIOACTIVE SEED LOCALIZATION
Anesthesia: General | Site: Breast | Laterality: Right

## 2021-03-09 MED ORDER — CHLORHEXIDINE GLUCONATE CLOTH 2 % EX PADS: 6.0000 | MEDICATED_PAD | Freq: Once | CUTANEOUS | Status: DC

## 2021-03-09 MED ORDER — PHENYLEPHRINE HCL (PRESSORS) 10 MG/ML IV SOLN
INTRAVENOUS | Status: DC | PRN
  Administered 2021-03-09: 80 ug via INTRAVENOUS

## 2021-03-09 MED ORDER — HYDROCODONE-ACETAMINOPHEN 5-325 MG PO TABS: 1.0000 | ORAL_TABLET | Freq: Four times a day (QID) | ORAL | 0 refills | Status: DC | PRN

## 2021-03-09 MED ORDER — FENTANYL CITRATE (PF) 100 MCG/2ML IJ SOLN
INTRAMUSCULAR | Status: AC
  Filled 2021-03-09: qty 2

## 2021-03-09 MED ORDER — EPHEDRINE 5 MG/ML INJ
INTRAVENOUS | Status: AC
  Filled 2021-03-09: qty 10

## 2021-03-09 MED ORDER — LACTATED RINGERS IV SOLN: INTRAVENOUS | Status: DC

## 2021-03-09 MED ORDER — PROPOFOL 10 MG/ML IV BOLUS
INTRAVENOUS | Status: DC | PRN
  Administered 2021-03-09: 150 mg via INTRAVENOUS

## 2021-03-09 MED ORDER — GABAPENTIN 300 MG PO CAPS
300.0000 mg | ORAL_CAPSULE | ORAL | Status: AC
  Administered 2021-03-09: 300 mg via ORAL

## 2021-03-09 MED ORDER — FENTANYL CITRATE (PF) 100 MCG/2ML IJ SOLN
INTRAMUSCULAR | Status: DC | PRN
  Administered 2021-03-09 (×2): 50 ug via INTRAVENOUS

## 2021-03-09 MED ORDER — LIDOCAINE 2% (20 MG/ML) 5 ML SYRINGE
INTRAMUSCULAR | Status: DC | PRN
  Administered 2021-03-09: 60 mg via INTRAVENOUS

## 2021-03-09 MED ORDER — CEFAZOLIN SODIUM-DEXTROSE 2-4 GM/100ML-% IV SOLN
INTRAVENOUS | Status: AC
  Filled 2021-03-09: qty 100

## 2021-03-09 MED ORDER — LIDOCAINE 2% (20 MG/ML) 5 ML SYRINGE
INTRAMUSCULAR | Status: AC
  Filled 2021-03-09: qty 15

## 2021-03-09 MED ORDER — BUPIVACAINE-EPINEPHRINE (PF) 0.25% -1:200000 IJ SOLN
INTRAMUSCULAR | Status: AC
  Filled 2021-03-09: qty 30

## 2021-03-09 MED ORDER — GABAPENTIN 300 MG PO CAPS
ORAL_CAPSULE | ORAL | Status: AC
  Filled 2021-03-09: qty 1

## 2021-03-09 MED ORDER — BUPIVACAINE-EPINEPHRINE (PF) 0.25% -1:200000 IJ SOLN
INTRAMUSCULAR | Status: DC | PRN
  Administered 2021-03-09: 10 mL

## 2021-03-09 MED ORDER — ONDANSETRON HCL 4 MG/2ML IJ SOLN
INTRAMUSCULAR | Status: DC | PRN
  Administered 2021-03-09: 4 mg via INTRAVENOUS

## 2021-03-09 MED ORDER — FENTANYL CITRATE (PF) 100 MCG/2ML IJ SOLN: 25.0000 ug | INTRAMUSCULAR | Status: DC | PRN

## 2021-03-09 MED ORDER — ACETAMINOPHEN 500 MG PO TABS
ORAL_TABLET | ORAL | Status: AC
  Filled 2021-03-09: qty 2

## 2021-03-09 MED ORDER — PROPOFOL 500 MG/50ML IV EMUL
INTRAVENOUS | Status: AC
  Filled 2021-03-09: qty 50

## 2021-03-09 MED ORDER — DEXAMETHASONE SODIUM PHOSPHATE 4 MG/ML IJ SOLN
INTRAMUSCULAR | Status: DC | PRN
  Administered 2021-03-09: 4 mg via INTRAVENOUS

## 2021-03-09 MED ORDER — CEFAZOLIN SODIUM-DEXTROSE 2-4 GM/100ML-% IV SOLN
2.0000 g | INTRAVENOUS | Status: AC
  Administered 2021-03-09: 2 g via INTRAVENOUS

## 2021-03-09 MED ORDER — DEXAMETHASONE SODIUM PHOSPHATE 10 MG/ML IJ SOLN
INTRAMUSCULAR | Status: AC
  Filled 2021-03-09: qty 2

## 2021-03-09 MED ORDER — AMISULPRIDE (ANTIEMETIC) 5 MG/2ML IV SOLN: 10.0000 mg | Freq: Once | INTRAVENOUS | Status: DC | PRN

## 2021-03-09 MED ORDER — ONDANSETRON HCL 4 MG/2ML IJ SOLN
INTRAMUSCULAR | Status: AC
  Filled 2021-03-09: qty 16

## 2021-03-09 MED ORDER — PHENYLEPHRINE 40 MCG/ML (10ML) SYRINGE FOR IV PUSH (FOR BLOOD PRESSURE SUPPORT)
PREFILLED_SYRINGE | INTRAVENOUS | Status: AC
  Filled 2021-03-09: qty 10

## 2021-03-09 MED ORDER — ACETAMINOPHEN 500 MG PO TABS
1000.0000 mg | ORAL_TABLET | ORAL | Status: AC
  Administered 2021-03-09: 1000 mg via ORAL

## 2021-03-09 SURGICAL SUPPLY — 46 items
ADH SKN CLS APL DERMABOND .7 (GAUZE/BANDAGES/DRESSINGS) ×1
APL PRP STRL LF DISP 70% ISPRP (MISCELLANEOUS) ×1
APPLIER CLIP 9.375 MED OPEN (MISCELLANEOUS) ×2
APR CLP MED 9.3 20 MLT OPN (MISCELLANEOUS) ×1
BLADE SURG 15 STRL LF DISP TIS (BLADE) ×1 IMPLANT
BLADE SURG 15 STRL SS (BLADE) ×2
CANISTER SUC SOCK COL 7IN (MISCELLANEOUS) ×2 IMPLANT
CANISTER SUCT 1200ML W/VALVE (MISCELLANEOUS) ×2 IMPLANT
CHLORAPREP W/TINT 26 (MISCELLANEOUS) ×2 IMPLANT
CLIP APPLIE 9.375 MED OPEN (MISCELLANEOUS) ×1 IMPLANT
COVER BACK TABLE 60X90IN (DRAPES) ×2 IMPLANT
COVER MAYO STAND STRL (DRAPES) ×2 IMPLANT
COVER PROBE W GEL 5X96 (DRAPES) ×2 IMPLANT
COVER WAND RF STERILE (DRAPES) IMPLANT
DECANTER SPIKE VIAL GLASS SM (MISCELLANEOUS) IMPLANT
DERMABOND ADVANCED (GAUZE/BANDAGES/DRESSINGS) ×1
DERMABOND ADVANCED .7 DNX12 (GAUZE/BANDAGES/DRESSINGS) ×1 IMPLANT
DRAPE LAPAROSCOPIC ABDOMINAL (DRAPES) ×2 IMPLANT
DRAPE UTILITY XL STRL (DRAPES) ×2 IMPLANT
ELECT COATED BLADE 2.86 ST (ELECTRODE) ×2 IMPLANT
ELECT REM PT RETURN 9FT ADLT (ELECTROSURGICAL) ×2
ELECTRODE REM PT RTRN 9FT ADLT (ELECTROSURGICAL) ×1 IMPLANT
GLOVE SURG ENC MOIS LTX SZ7.5 (GLOVE) ×4 IMPLANT
GLOVE SURG POLYISO LF SZ6.5 (GLOVE) ×2 IMPLANT
GLOVE SURG UNDER POLY LF SZ7 (GLOVE) ×2 IMPLANT
GOWN STRL REUS W/ TWL LRG LVL3 (GOWN DISPOSABLE) ×2 IMPLANT
GOWN STRL REUS W/ TWL XL LVL3 (GOWN DISPOSABLE) ×1 IMPLANT
GOWN STRL REUS W/TWL LRG LVL3 (GOWN DISPOSABLE) ×4
GOWN STRL REUS W/TWL XL LVL3 (GOWN DISPOSABLE) ×2
ILLUMINATOR WAVEGUIDE N/F (MISCELLANEOUS) IMPLANT
KIT MARKER MARGIN INK (KITS) ×2 IMPLANT
LIGHT WAVEGUIDE WIDE FLAT (MISCELLANEOUS) IMPLANT
NEEDLE HYPO 25X1 1.5 SAFETY (NEEDLE) IMPLANT
NS IRRIG 1000ML POUR BTL (IV SOLUTION) ×2 IMPLANT
PACK BASIN DAY SURGERY FS (CUSTOM PROCEDURE TRAY) ×2 IMPLANT
PENCIL SMOKE EVACUATOR (MISCELLANEOUS) ×2 IMPLANT
SLEEVE SCD COMPRESS KNEE MED (STOCKING) ×2 IMPLANT
SPONGE LAP 18X18 RF (DISPOSABLE) ×2 IMPLANT
SUT MON AB 4-0 PC3 18 (SUTURE) ×2 IMPLANT
SUT SILK 2 0 SH (SUTURE) IMPLANT
SUT VICRYL 3-0 CR8 SH (SUTURE) ×2 IMPLANT
SYR CONTROL 10ML LL (SYRINGE) IMPLANT
TOWEL GREEN STERILE FF (TOWEL DISPOSABLE) ×2 IMPLANT
TRAY FAXITRON CT DISP (TRAY / TRAY PROCEDURE) ×2 IMPLANT
TUBE CONNECTING 20X1/4 (TUBING) ×2 IMPLANT
YANKAUER SUCT BULB TIP NO VENT (SUCTIONS) IMPLANT

## 2021-03-09 NOTE — Anesthesia Preprocedure Evaluation (Signed)
Anesthesia Evaluation  Patient identified by MRN, date of birth, ID band Patient awake    Reviewed: Allergy & Precautions, NPO status , Patient's Chart, lab work & pertinent test results  Airway Mallampati: III  TM Distance: >3 FB Neck ROM: Full    Dental  (+) Dental Advisory Given   Pulmonary asthma , COPD,  COPD inhaler, former smoker,    breath sounds clear to auscultation       Cardiovascular hypertension, Pt. on medications  Rhythm:Regular Rate:Normal     Neuro/Psych negative neurological ROS     GI/Hepatic Neg liver ROS, GERD  ,  Endo/Other  diabetes, Type 2, Oral Hypoglycemic Agents  Renal/GU negative Renal ROS     Musculoskeletal   Abdominal   Peds  Hematology negative hematology ROS (+)   Anesthesia Other Findings   Reproductive/Obstetrics                             Anesthesia Physical Anesthesia Plan  ASA: III  Anesthesia Plan: General   Post-op Pain Management:    Induction: Intravenous  PONV Risk Score and Plan: 3 and Dexamethasone, Ondansetron and Treatment may vary due to age or medical condition  Airway Management Planned: LMA  Additional Equipment:   Intra-op Plan:   Post-operative Plan: Extubation in OR  Informed Consent: I have reviewed the patients History and Physical, chart, labs and discussed the procedure including the risks, benefits and alternatives for the proposed anesthesia with the patient or authorized representative who has indicated his/her understanding and acceptance.     Dental advisory given  Plan Discussed with: CRNA  Anesthesia Plan Comments:         Anesthesia Quick Evaluation

## 2021-03-09 NOTE — H&P (Signed)
Michelle Long  Location: [facility] Surgery Patient #: [id number] DOB: January 11, 1946 Single / Language: Cleophus Molt / Race: White Female   History of Present Illness The patient is a 75 year old female who presents with a breast mass. We are asked to see the patient in consultation by Dr. Carollee Herter to evaluate her for a complex sclerosing lesion of the right breast. The patient is a 75 year old white female who recently went for a routine screening mammogram. At that time she was found to have a 7 mm area of abnormal enhancement on MRI in the upper inner quadrant of the right breast. There was 2 small satellite lesions just adjacent to it which in total probably measured less than 2 cm. The area was biopsied and came back as a complex sclerosing lesion. She also had some enhancement in the left breast which has been stable. She does have a history of left breast cancer 15 years ago that was treated with lumpectomy, radiation, and chemotherapy. She also reports that her sister was recently found to have 2 masses in her abdomen and has lost a significant amount of weight   Past Surgical History Appendectomy  Breast Biopsy  Right. Cataract Surgery  Bilateral. Colon Polyp Removal - Colonoscopy  Hysterectomy (not due to cancer) - Partial  Sentinel Lymph Node Biopsy   Diagnostic Studies History  Colonoscopy  1-5 years ago Mammogram  within last year Pap Smear  >5 years ago  Allergies  Sulfa 10 *OPHTHALMIC AGENTS*  Allergies Reconciled   Medication History  LORazepam (1MG Tablet, Oral) Active. Alendronate Sodium (70MG Tablet, Oral) Active. Albuterol Sulfate HFA (108 (90 Base)MCG/ACT Aerosol Soln, Inhalation) Active. Azithromycin (250MG Tablet, Oral) Active. Citalopram Hydrobromide (10MG Tablet, Oral) Active. Ezetimibe (10MG Tablet, Oral) Active. Losartan Potassium (50MG Tablet, Oral) Active. metFORMIN HCl (1000MG Tablet, Oral) Active. OLANZapine (5MG Tablet,  Oral) Active. Pantoprazole Sodium (40MG Tablet DR, Oral) Active. predniSONE (10MG Tablet, Oral) Active. Rosuvastatin Calcium (5MG Tablet, Oral) Active. Symbicort (160-4.5MCG/ACT Aerosol, Inhalation) Active. True Metrix Meter (w/Device Kit,) Active. True Metrix Blood Glucose Test (In Vitro) Active. TRUEplus Lancets 33G Active. Medications Reconciled  Social History  Alcohol use  Occasional alcohol use. No drug use  Tobacco use  Former smoker.  Family History Alcohol Abuse  Father. Breast Cancer  Mother, Sister. Diabetes Mellitus  Father. Heart Disease  Father.  Pregnancy / Birth History  Age at menarche  81 years. Age of menopause  [age] Contraceptive History  Intrauterine device. Gravida  4 Length (months) of breastfeeding  7-12 Maternal age  23-25  Other Problems  Anxiety Disorder  Asthma  Cancer  Depression  Diabetes Mellitus  Gastroesophageal Reflux Disease  Hepatitis  High blood pressure  Hypercholesterolemia  Lump In Breast     Review of Systems  General Not Present- Appetite Loss, Chills, Fatigue, Fever, Night Sweats, Weight Gain and Weight Loss. Skin Not Present- Change in Wart/Mole, Dryness, Hives, Jaundice, New Lesions, Non-Healing Wounds, Rash and Ulcer. HEENT Not Present- Earache, Hearing Loss, Hoarseness, Nose Bleed, Oral Ulcers, Ringing in the Ears, Seasonal Allergies, Sinus Pain, Sore Throat, Visual Disturbances, Wears glasses/contact lenses and Yellow Eyes. Respiratory Not Present- Bloody sputum, Chronic Cough, Difficulty Breathing, Snoring and Wheezing. Breast Not Present- Breast Mass, Breast Pain, Nipple Discharge and Skin Changes. Cardiovascular Present- Shortness of Breath. Not Present- Chest Pain, Difficulty Breathing Lying Down, Leg Cramps, Palpitations, Rapid Heart Rate and Swelling of Extremities. Neurological Present- Weakness. Not Present- Decreased Memory, Fainting, Headaches, Numbness, Seizures, Tingling,  Tremor and Trouble walking. Psychiatric  Present- Bipolar. Not Present- Anxiety, Change in Sleep Pattern, Depression, Fearful and Frequent crying. Endocrine Not Present- Cold Intolerance, Excessive Hunger, Hair Changes, Heat Intolerance, Hot flashes and New Diabetes. Hematology Not Present- Blood Thinners, Easy Bruising, Excessive bleeding, Gland problems, HIV and Persistent Infections.  Vitals Weight: 157.25 lb Height: 63.5in Body Surface Area: 1.76 m Body Mass Index: 27.42 kg/m  Temp.: 97.54F  Pulse: 121 (Regular)  P.OX: 94% (Room air) BP: 140/76(Sitting, Left Arm, Standard)       Physical Exam General Mental Status-Alert. General Appearance-Consistent with stated age. Hydration-Well hydrated. Voice-Normal.  Head and Neck Head-normocephalic, atraumatic with no lesions or palpable masses. Trachea-midline. Thyroid Gland Characteristics - normal size and consistency.  Eye Eyeball - Bilateral-Extraocular movements intact. Sclera/Conjunctiva - Bilateral-No scleral icterus.  Chest and Lung Exam Chest and lung exam reveals -quiet, even and easy respiratory effort with no use of accessory muscles and on auscultation, normal breath sounds, no adventitious sounds and normal vocal resonance. Inspection Chest Wall - Normal. Back - normal.  Breast Note: There is no palpable mass in the right breast. There is no palpable mass in the left breast. There is a well-healed scar medially in the left breast with some overall breast contraction secondary to radiation. There is no palpable axillary, supraclavicular, or cervical lymphadenopathy.   Cardiovascular Cardiovascular examination reveals -normal heart sounds, regular rate and rhythm with no murmurs and normal pedal pulses bilaterally.  Abdomen Inspection Inspection of the abdomen reveals - No Hernias. Skin - Scar - no surgical scars. Palpation/Percussion Palpation and Percussion of the abdomen  reveal - Soft, Non Tender, No Rebound tenderness, No Rigidity (guarding) and No hepatosplenomegaly. Auscultation Auscultation of the abdomen reveals - Bowel sounds normal.  Neurologic Neurologic evaluation reveals -alert and oriented x 3 with no impairment of recent or remote memory. Mental Status-Normal.  Musculoskeletal Normal Exam - Left-Upper Extremity Strength Normal and Lower Extremity Strength Normal. Normal Exam - Right-Upper Extremity Strength Normal and Lower Extremity Strength Normal.  Lymphatic Head & Neck  General Head & Neck Lymphatics: Bilateral - Description - Normal. Axillary  General Axillary Region: Bilateral - Description - Normal. Tenderness - Non Tender. Femoral & Inguinal  Generalized Femoral & Inguinal Lymphatics: Bilateral - Description - Normal. Tenderness - Non Tender.    Assessment & Plan  SCLEROSING ADENOSIS OF BREAST, RIGHT (N60.21) Impression: The patient appears to have an area of complex sclerosing lesion in the upper inner quadrant of the right breast. Because the size of this areas over a centimeter and with her history of breast cancer on the other side she would very much like to have this area removed and I think that is a very reasonable thing to do. I have discussed with her in detail the risks and benefits of the operation as well as some of the technical aspects including use of a radioactive seed for localization and she understands and wishes to proceed. This patient encounter took 45 minutes today to perform the following: take history, perform exam, review outside records, interpret imaging, counsel the patient on their diagnosis and document encounter, findings & plan in the EHR

## 2021-03-09 NOTE — Transfer of Care (Signed)
Immediate Anesthesia Transfer of Care Note  Patient: Michelle Long  Procedure(s) Performed: RIGHT BREAST LUMPECTOMY WITH RADIOACTIVE SEED LOCALIZATION (Right Breast)  Patient Location: PACU  Anesthesia Type:General  Level of Consciousness: drowsy and patient cooperative  Airway & Oxygen Therapy: Patient Spontanous Breathing and Patient connected to face mask oxygen  Post-op Assessment: Report given to RN and Post -op Vital signs reviewed and stable  Post vital signs: Reviewed and stable  Last Vitals:  Vitals Value Taken Time  BP    Temp    Pulse 95 03/09/21 1159  Resp 11 03/09/21 1159  SpO2 97 % 03/09/21 1159    Last Pain:  Vitals:   03/09/21 1034  TempSrc: Oral  PainSc: 0-No pain      Patients Stated Pain Goal: 5 (32/95/18 [id number])  Complications: No complications documented.

## 2021-03-09 NOTE — Interval H&P Note (Signed)
History and Physical Interval Note:  03/09/2021 10:41 AM  Michelle Long  has presented today for surgery, with the diagnosis of RIGHT BREAST CSL.  The various methods of treatment have been discussed with the patient and family. After consideration of risks, benefits and other options for treatment, the patient has consented to  Procedure(s): RIGHT BREAST LUMPECTOMY WITH RADIOACTIVE SEED LOCALIZATION (Right) as a surgical intervention.  The patient's history has been reviewed, patient examined, no change in status, stable for surgery.  I have reviewed the patient's chart and labs.  Questions were answered to the patient's satisfaction.     Autumn Messing III

## 2021-03-09 NOTE — Anesthesia Postprocedure Evaluation (Signed)
Anesthesia Post Note  Patient: Michelle Long  Procedure(s) Performed: RIGHT BREAST LUMPECTOMY WITH RADIOACTIVE SEED LOCALIZATION (Right Breast)     Patient location during evaluation: PACU Anesthesia Type: General Level of consciousness: awake and alert Pain management: pain level controlled Vital Signs Assessment: post-procedure vital signs reviewed and stable Respiratory status: spontaneous breathing, nonlabored ventilation, respiratory function stable and patient connected to nasal cannula oxygen Cardiovascular status: blood pressure returned to baseline and stable Postop Assessment: no apparent nausea or vomiting Anesthetic complications: no   No complications documented.  Last Vitals:  Vitals:   03/09/21 1230 03/09/21 1245  BP: 126/68 108/67  Pulse: 93 87  Resp: 14 16  Temp:    SpO2: 93% 97%    Last Pain:  Vitals:   03/09/21 1245  TempSrc:   PainSc: 0-No pain                 Tiajuana Amass

## 2021-03-09 NOTE — Discharge Instructions (Signed)

## 2021-03-09 NOTE — Op Note (Signed)
03/09/2021  11:53 AM  PATIENT:  Michelle Long  75 y.o. female  PRE-OPERATIVE DIAGNOSIS:  RIGHT BREAST COMPLEX SCLEROSING LESION  POST-OPERATIVE DIAGNOSIS:  RIGHT BREAST COMPLEX SCLEROSING LESION  PROCEDURE:  Procedure(s): RIGHT BREAST LUMPECTOMY WITH RADIOACTIVE SEED LOCALIZATION (Right)  SURGEON:  Surgeon(s) and Role:    * [name], MD - Primary  PHYSICIAN ASSISTANT:   ASSISTANTS: none   ANESTHESIA:   local and general  EBL:  minimal   BLOOD ADMINISTERED:none  DRAINS: none   LOCAL MEDICATIONS USED:  MARCAINE     SPECIMEN:  Source of Specimen:  right breast tissue  DISPOSITION OF SPECIMEN:  PATHOLOGY  COUNTS:  YES  TOURNIQUET:  * No tourniquets in log *  DICTATION: .Dragon Dictation   After informed consent was obtained the patient was brought to the operating room and placed in the supine position on the operating table.  After adequate induction of general anesthesia the patient's right breast was prepped with ChloraPrep, allowed to dry, and draped in usual sterile manner.  An appropriate timeout was performed.  The neoprobe was set to I-125 in the radioactive seed was identified in the subareolar inner aspect of the right breast.  The area around this was infiltrated with quarter percent Marcaine.  A curvilinear incision was made along the inner aspect of the right areola with a 15 blade knife.  The incision was carried through the skin and subcutaneous tissue sharply with the electrocautery.  Dissection was then carried underneath the areola between the skin and the seed.  Once the dissection was beyond the area of the radioactive seed then I removed a wedge of breast tissue from the subareolar area around the radioactive seed while checking the area of radioactivity frequently.  Once the specimen was removed it was oriented with the appropriate paint colors.  A specimen radiograph was obtained that showed the clip and seed to be near the center of the specimen.  The  specimen was then sent to pathology for further evaluation.  Hemostasis was achieved using the Bovie electrocautery.  The wound was irrigated with saline and infiltrated with more quarter percent Marcaine.  The deep layer of the incision was then closed with layers of interrupted 3-0 Vicryl stitches.  The skin was closed with interrupted 4-0 Monocryl subcuticular stitches.  Dermabond dressings were applied.  The patient tolerated the procedure well.  At the end of the case all needle sponge and instrument counts were correct.  The patient was then awakened and taken to recovery in stable condition.  PLAN OF CARE: Discharge to home after PACU  PATIENT DISPOSITION:  PACU - hemodynamically stable.   Delay start of Pharmacological VTE agent (>24hrs) due to surgical blood loss or risk of bleeding: not applicable

## 2021-03-09 NOTE — Anesthesia Procedure Notes (Signed)
Procedure Name: LMA Insertion Date/Time: 03/09/2021 11:15 AM Performed by: Signe Colt, CRNA Pre-anesthesia Checklist: Patient identified, Emergency Drugs available, Suction available and Patient being monitored Patient Re-evaluated:Patient Re-evaluated prior to induction Oxygen Delivery Method: Circle System Utilized Preoxygenation: Pre-oxygenation with 100% oxygen Induction Type: IV induction Ventilation: Mask ventilation without difficulty LMA: LMA inserted LMA Size: 4.0 Number of attempts: 1 Airway Equipment and Method: bite block Placement Confirmation: positive ETCO2 Tube secured with: Tape Dental Injury: Teeth and Oropharynx as per pre-operative assessment

## 2021-03-12 ENCOUNTER — Encounter: Payer: Self-pay | Admitting: Hematology & Oncology

## 2021-03-12 ENCOUNTER — Inpatient Hospital Stay: Payer: Medicare HMO | Attending: Family Medicine

## 2021-03-12 ENCOUNTER — Inpatient Hospital Stay (HOSPITAL_BASED_OUTPATIENT_CLINIC_OR_DEPARTMENT_OTHER): Payer: Medicare HMO | Admitting: Hematology & Oncology

## 2021-03-12 ENCOUNTER — Other Ambulatory Visit: Payer: Self-pay

## 2021-03-12 ENCOUNTER — Telehealth: Payer: Self-pay

## 2021-03-12 VITALS — BP 120/76 | HR 91 | Temp 98.8°F | Resp 20 | Wt 157.0 lb

## 2021-03-12 DIAGNOSIS — Z170 Estrogen receptor positive status [ER+]: Secondary | ICD-10-CM

## 2021-03-12 DIAGNOSIS — Z803 Family history of malignant neoplasm of breast: Secondary | ICD-10-CM | POA: Insufficient documentation

## 2021-03-12 DIAGNOSIS — C50312 Malignant neoplasm of lower-inner quadrant of left female breast: Secondary | ICD-10-CM

## 2021-03-12 DIAGNOSIS — M8580 Other specified disorders of bone density and structure, unspecified site: Secondary | ICD-10-CM | POA: Insufficient documentation

## 2021-03-12 DIAGNOSIS — Z79811 Long term (current) use of aromatase inhibitors: Secondary | ICD-10-CM | POA: Insufficient documentation

## 2021-03-12 DIAGNOSIS — Z923 Personal history of irradiation: Secondary | ICD-10-CM | POA: Insufficient documentation

## 2021-03-12 DIAGNOSIS — Z780 Asymptomatic menopausal state: Secondary | ICD-10-CM | POA: Insufficient documentation

## 2021-03-12 DIAGNOSIS — C50912 Malignant neoplasm of unspecified site of left female breast: Secondary | ICD-10-CM | POA: Insufficient documentation

## 2021-03-12 LAB — SURGICAL PATHOLOGY

## 2021-03-12 LAB — CMP (CANCER CENTER ONLY)
ALT: 36 U/L (ref 0–44)
AST: 33 U/L (ref 15–41)
Albumin: 4.3 g/dL (ref 3.5–5.0)
Alkaline Phosphatase: 34 U/L — ABNORMAL LOW (ref 38–126)
Anion gap: 8 (ref 5–15)
BUN: 12 mg/dL (ref 8–23)
CO2: 33 mmol/L — ABNORMAL HIGH (ref 22–32)
Calcium: 10.3 mg/dL (ref 8.9–10.3)
Chloride: 100 mmol/L (ref 98–111)
Creatinine: 0.81 mg/dL (ref 0.44–1.00)
GFR, Estimated: >60 mL/min (ref >60)
Glucose, Bld: 121 mg/dL — ABNORMAL HIGH (ref 70–99)
Potassium: 4.4 mmol/L (ref 3.5–5.1)
Sodium: 141 mmol/L (ref 135–145)
Total Bilirubin: 0.4 mg/dL (ref 0.3–1.2)
Total Protein: 6.7 g/dL (ref 6.5–8.1)

## 2021-03-12 LAB — CBC WITH DIFFERENTIAL (CANCER CENTER ONLY)
Abs Immature Granulocytes: 0.03 10*3/uL (ref 0.00–0.07)
Basophils Absolute: 0 10*3/uL (ref 0.0–0.1)
Basophils Relative: 1 %
Eosinophils Absolute: 0.2 10*3/uL (ref 0.0–0.5)
Eosinophils Relative: 3 %
HCT: 43.2 % (ref 36.0–46.0)
Hemoglobin: 13.9 g/dL (ref 12.0–15.0)
Immature Granulocytes: 1 %
Lymphocytes Relative: 35 %
Lymphs Abs: 2 10*3/uL (ref 0.7–4.0)
MCH: 29.5 pg (ref 26.0–34.0)
MCHC: 32.2 g/dL (ref 30.0–36.0)
MCV: 91.7 fL (ref 80.0–100.0)
Monocytes Absolute: 0.6 10*3/uL (ref 0.1–1.0)
Monocytes Relative: 10 %
Neutro Abs: 3 10*3/uL (ref 1.7–7.7)
Neutrophils Relative %: 50 %
Platelet Count: 170 10*3/uL (ref 150–400)
RBC: 4.71 MIL/uL (ref 3.87–5.11)
RDW: 13.3 % (ref 11.5–15.5)
WBC Count: 5.8 10*3/uL (ref 4.0–10.5)
nRBC: 0 % (ref 0.0–0.2)

## 2021-03-12 LAB — LACTATE DEHYDROGENASE: LDH: 181 U/L (ref 98–192)

## 2021-03-12 MED ORDER — EXEMESTANE 25 MG PO TABS: 25.0000 mg | ORAL_TABLET | Freq: Every day | ORAL | 12 refills | Status: DC

## 2021-03-12 NOTE — Telephone Encounter (Signed)
appts made and printed for pt per los   Michelle Long 

## 2021-03-12 NOTE — Progress Notes (Signed)
Hematology and Oncology Follow Up Visit  Michelle Long [id number] 02/25/1946 75 y.o. 03/12/2021   Principle Diagnosis:  Stage IIA (T2 N0M0) infiltrating ductal carcinoma of the left breast Usual Ductal Hyperplasia -- Complex sclerosing    Current Therapy:   Tamoxifen 20 mg by mouth daily - finished in September 2017 Aromasin 25 mg po q day -- start on 03/13/2021    Interim History:  Michelle Long is here for follow-up.  Probably enough, she does have a lumpectomy back on 03/09/2021.  This is for an abnormality seen on MRI.  An initial biopsy was unremarkable.  Because of the strong family history of breast cancer, she underwent lobectomy.  The pathology report ([id number]) showed complex sclerosing lesion with usual ductal hyperplasia.  It is hard to say if there is any type of atypia.  Of note, there is a strong family history of breast cancer.  I know that she has been tested for the BRCA gene and this was negative.  I do think that this would not be a bad situation where we would try to be "proactive" and try to treat her with oral aromatase inhibitor to try to help prevent anything down the road from happening.  I really see very little downside.  She had a bone density test back in October 2021.  This showed some osteopenia.  I think that Aromasin would not be a bad idea.  Again I think that given the strong family history, I think that there would be an increased risk of breast cancer with Michelle Long.    Currently, she has had no problems with cough or shortness of breath.  Her 75th birthday is coming up in a month.  She is going down to [place] for this.  She has had no change in bowel or bladder habits.  There has been no problems with rashes.  She has had no fever.  There is no issues with COVID.  Overall, I would say performance status is by ECOG 1.   Medications:  Allergies as of 03/12/2021      Reactions   Fluoxetine Other (See Comments)   seizure   Lamotrigine Other  (See Comments), Itching   itch itch   Sulfa Antibiotics Other (See Comments), Shortness Of Breath   Dyspnea   Statins Other (See Comments)   Muscle pains Muscle pains Muscle pains Muscle pain   Fluoxetine Hcl Other (See Comments)   seizure   Sulfa Drugs Cross Reactors    Dyspnea      Medication List       Accurate as of Mar 12, 2021 12:37 PM. If you have any questions, ask your nurse or doctor.        STOP taking these medications   Shingrix injection Generic drug: Zoster Vaccine Adjuvanted Stopped by: Volanda Napoleon, MD     TAKE these medications   albuterol 108 (90 Base) MCG/ACT inhaler Commonly known as: Ventolin HFA INHALE TWO PUFFS INTO LUNGS EVERY 6 HOURS AS NEEDED FOR WHEEZING OR SHORTNESS OF BREATH   alendronate 70 MG tablet Commonly known as: FOSAMAX TAKE 1 TABLET BY MOUTH ONCE A WEEK WITH A FULL GLASS OF WATER ON AN EMPTY STOMACH   aspirin 81 MG tablet Take 81 mg by mouth daily.   b complex vitamins tablet Take 1 tablet by mouth daily.   budesonide-formoterol 160-4.5 MCG/ACT inhaler Commonly known as: Symbicort Inhale 2 puffs into the lungs in the morning and at bedtime.   CALCIUM 1000 +  D PO Take by mouth 2 (two) times daily.   citalopram 10 MG tablet Commonly known as: CELEXA Take 0.5 tablets (5 mg total) by mouth at bedtime.   ezetimibe 10 MG tablet Commonly known as: ZETIA Take 1 tablet (10 mg total) by mouth daily.   Hair Skin and Nails Formula Tabs Take 1 tablet by mouth daily. 2,500 mg daily   HYDROcodone-acetaminophen 5-325 MG tablet Commonly known as: NORCO/VICODIN Take 1-2 tablets by mouth every 6 (six) hours as needed for moderate pain or severe pain.   LORazepam 0.5 MG tablet Commonly known as: ATIVAN Take 1 tablet (0.5 mg total) by mouth 2 (two) times daily as needed.   losartan 50 MG tablet Commonly known as: COZAAR Take 1 tablet (50 mg total) by mouth daily.   metFORMIN 1000 MG tablet Commonly known as:  GLUCOPHAGE Take 1 tablet (1,000 mg total) by mouth 2 (two) times daily with a meal.   multivitamin tablet Take 1 tablet by mouth daily.   OLANZapine 5 MG tablet Commonly known as: ZYPREXA 1/2 tablet daily What changed:   how much to take  when to take this  additional instructions   pantoprazole 40 MG tablet Commonly known as: PROTONIX Take 1 tablet (40 mg total) by mouth daily.   rosuvastatin 5 MG tablet Commonly known as: CRESTOR TAKE 1 TABLET THREE TIMES WEEKLY   Spacer/Aero Chamber Mouthpiece Misc To use with inhaler   True Metrix Blood Glucose Test test strip Generic drug: glucose blood TEST BLOOD SUGAR EVERY DAY.  Dx code: E11.65   True Metrix Meter w/Device Kit Check sugars once daily as directed. E11.9   TRUEplus Lancets 33G Misc TEST ONE TIME DAILY AS DIRECTED.  Dx code: E11.65   Vitamin D 50 MCG (2000 UT) Caps Take 2,000 Units by mouth daily.       Allergies:  Allergies  Allergen Reactions  . Fluoxetine Other (See Comments)    seizure  . Lamotrigine Other (See Comments) and Itching    itch itch  . Sulfa Antibiotics Other (See Comments) and Shortness Of Breath    Dyspnea  . Statins Other (See Comments)    Muscle pains Muscle pains Muscle pains Muscle pain  . Fluoxetine Hcl Other (See Comments)    seizure  . Sulfa Drugs Cross Reactors     Dyspnea     Past Medical History, Surgical history, Social history, and Family History were reviewed and updated.  Review of Systems: Review of Systems  Constitutional: Negative.   HENT: Negative.   Eyes: Negative.   Respiratory: Negative.   Cardiovascular: Negative.   Gastrointestinal: Negative.   Genitourinary: Negative.   Musculoskeletal: Negative.   Skin: Negative.   Neurological: Negative.   Endo/Heme/Allergies: Negative.   Psychiatric/Behavioral: Negative.      Physical Exam:  weight is 157 lb (71.2 kg). Her oral temperature is 98.8 F (37.1 C). Her blood pressure is 120/76 and her  pulse is 91. Her respiration is 20 and oxygen saturation is 95%.   Wt Readings from Last 3 Encounters:  03/12/21 157 lb (71.2 kg)  03/09/21 154 lb 5.2 oz (70 kg)  09/28/20 152 lb (68.9 kg)    Physical Exam Vitals reviewed.  Constitutional:      Comments: Her breast exam shows her right breast with no masses, edema or erythema. There is no right axillary adenopathy. Left breast is somewhat contracted from surgery and radiation. She has a well-healed lumpectomy scar at the 6:00 position. There is some slight  firmness at the lumpectomy site. There is no distinct mass. There is no left axillary adenopathy.  HENT:     Head: Normocephalic and atraumatic.  Eyes:     Pupils: Pupils are equal, round, and reactive to light.  Cardiovascular:     Rate and Rhythm: Normal rate and regular rhythm.     Heart sounds: Normal heart sounds.  Pulmonary:     Effort: Pulmonary effort is normal.     Breath sounds: Normal breath sounds.  Abdominal:     General: Bowel sounds are normal.     Palpations: Abdomen is soft.  Musculoskeletal:        General: No tenderness or deformity. Normal range of motion.     Cervical back: Normal range of motion.  Lymphadenopathy:     Cervical: No cervical adenopathy.  Skin:    General: Skin is warm and dry.     Findings: No erythema or rash.  Neurological:     Mental Status: She is alert and oriented to person, place, and time.  Psychiatric:        Behavior: Behavior normal.        Thought Content: Thought content normal.        Judgment: Judgment normal.    athy.   Lab Results  Component Value Date   WBC 5.8 03/12/2021   HGB 13.9 03/12/2021   HCT 43.2 03/12/2021   MCV 91.7 03/12/2021   PLT 170 03/12/2021   No results found for: FERRITIN, IRON, TIBC, UIBC, IRONPCTSAT Lab Results  Component Value Date   RBC 4.71 03/12/2021   No results found for: KPAFRELGTCHN, LAMBDASER, KAPLAMBRATIO No results found for: IGGSERUM, IGA, IGMSERUM No results found for:  Odetta Pink, SPEI   Chemistry      Component Value Date/Time   NA 141 03/12/2021 1146   NA 139 07/02/2017 1445   NA 142 01/01/2017 1059   K 4.4 03/12/2021 1146   K 4.6 07/02/2017 1445   K 4.6 01/01/2017 1059   CL 100 03/12/2021 1146   CL 103 07/02/2017 1445   CL 105 01/20/2015 1240   CO2 33 (H) 03/12/2021 1146   CO2 28 07/02/2017 1445   CO2 26 01/01/2017 1059   BUN 12 03/12/2021 1146   BUN 10 07/02/2017 1445   BUN 10.6 01/01/2017 1059   CREATININE 0.81 03/12/2021 1146   CREATININE 0.68 07/17/2020 1105   CREATININE 0.8 01/01/2017 1059      Component Value Date/Time   CALCIUM 10.3 03/12/2021 1146   CALCIUM 10.3 07/02/2017 1445   CALCIUM 10.1 01/01/2017 1059   ALKPHOS 34 (L) 03/12/2021 1146   ALKPHOS 47 07/02/2017 1445   ALKPHOS 49 01/01/2017 1059   AST 33 03/12/2021 1146   AST 60 (H) 01/01/2017 1059   ALT 36 03/12/2021 1146   ALT 63 (H) 01/01/2017 1059   BILITOT 0.4 03/12/2021 1146   BILITOT 0.64 01/01/2017 1059     Impression and Plan: Ms. Villarruel is a most delightful 75 year old postmenopausal white female. She had stage IIA ductal carcinoma the left breast. She completed tamoxifen in September 2017.  Again, she now has this lesion that was removed.  It was a complex sclerosing lesion with usual ductal hyperplasia.  I just think that we should have her on something as a preventive measure.  I know this might be a little controversial.  I just cannot get over the fact that there is a strong family history of breast cancer.  We will try her on Aromasin.  We will see how she does.  We will go ahead and get her back in 6 weeks to make sure everything is going okay.   Volanda Napoleon, MD 5/23/202212:37 PM

## 2021-03-16 ENCOUNTER — Telehealth: Payer: Self-pay

## 2021-03-16 NOTE — Telephone Encounter (Signed)
Received VM yesterday and today from pt questioning if there was a way to get patient assistance with Aromasin. Both VM forwarded to financial counseling. dph

## 2021-03-23 ENCOUNTER — Encounter: Payer: Self-pay | Admitting: Family Medicine

## 2021-03-23 ENCOUNTER — Ambulatory Visit (INDEPENDENT_AMBULATORY_CARE_PROVIDER_SITE_OTHER): Payer: Medicare HMO | Admitting: Family Medicine

## 2021-03-23 ENCOUNTER — Other Ambulatory Visit: Payer: Self-pay

## 2021-03-23 VITALS — BP 120/70 | HR 106 | Temp 98.5°F | Resp 18 | Ht 63.0 in | Wt 154.2 lb

## 2021-03-23 DIAGNOSIS — J440 Chronic obstructive pulmonary disease with acute lower respiratory infection: Secondary | ICD-10-CM | POA: Diagnosis not present

## 2021-03-23 DIAGNOSIS — J4521 Mild intermittent asthma with (acute) exacerbation: Secondary | ICD-10-CM

## 2021-03-23 DIAGNOSIS — I427 Cardiomyopathy due to drug and external agent: Secondary | ICD-10-CM | POA: Insufficient documentation

## 2021-03-23 DIAGNOSIS — F3181 Bipolar II disorder: Secondary | ICD-10-CM | POA: Diagnosis not present

## 2021-03-23 DIAGNOSIS — I10 Essential (primary) hypertension: Secondary | ICD-10-CM

## 2021-03-23 DIAGNOSIS — Z23 Encounter for immunization: Secondary | ICD-10-CM | POA: Diagnosis not present

## 2021-03-23 DIAGNOSIS — F3160 Bipolar disorder, current episode mixed, unspecified: Secondary | ICD-10-CM

## 2021-03-23 DIAGNOSIS — E1165 Type 2 diabetes mellitus with hyperglycemia: Secondary | ICD-10-CM

## 2021-03-23 DIAGNOSIS — Z0000 Encounter for general adult medical examination without abnormal findings: Secondary | ICD-10-CM

## 2021-03-23 DIAGNOSIS — E785 Hyperlipidemia, unspecified: Secondary | ICD-10-CM | POA: Diagnosis not present

## 2021-03-23 DIAGNOSIS — J209 Acute bronchitis, unspecified: Secondary | ICD-10-CM

## 2021-03-23 DIAGNOSIS — C50312 Malignant neoplasm of lower-inner quadrant of left female breast: Secondary | ICD-10-CM | POA: Diagnosis not present

## 2021-03-23 DIAGNOSIS — D229 Melanocytic nevi, unspecified: Secondary | ICD-10-CM

## 2021-03-23 DIAGNOSIS — T451X5A Adverse effect of antineoplastic and immunosuppressive drugs, initial encounter: Secondary | ICD-10-CM

## 2021-03-23 DIAGNOSIS — E1169 Type 2 diabetes mellitus with other specified complication: Secondary | ICD-10-CM | POA: Diagnosis not present

## 2021-03-23 DIAGNOSIS — D696 Thrombocytopenia, unspecified: Secondary | ICD-10-CM

## 2021-03-23 LAB — CBC WITH DIFFERENTIAL/PLATELET
Basophils Absolute: 0.1 10*3/uL (ref 0.0–0.1)
Basophils Relative: 0.6 % (ref 0.0–3.0)
Eosinophils Absolute: 0.1 10*3/uL (ref 0.0–0.7)
Eosinophils Relative: 1.7 % (ref 0.0–5.0)
HCT: 42.1 % (ref 36.0–46.0)
Hemoglobin: 14 g/dL (ref 12.0–15.0)
Lymphocytes Relative: 22.7 % (ref 12.0–46.0)
Lymphs Abs: 2 10*3/uL (ref 0.7–4.0)
MCHC: 33.3 g/dL (ref 30.0–36.0)
MCV: 90 fl (ref 78.0–100.0)
Monocytes Absolute: 0.6 10*3/uL (ref 0.1–1.0)
Monocytes Relative: 6.7 % (ref 3.0–12.0)
Neutro Abs: 6 10*3/uL (ref 1.4–7.7)
Neutrophils Relative %: 68.3 % (ref 43.0–77.0)
Platelets: 196 10*3/uL (ref 150.0–400.0)
RBC: 4.68 Mil/uL (ref 3.87–5.11)
RDW: 14.2 % (ref 11.5–15.5)
WBC: 8.8 10*3/uL (ref 4.0–10.5)

## 2021-03-23 LAB — HEMOGLOBIN A1C: Hgb A1c MFr Bld: 7.3 % — ABNORMAL HIGH (ref 4.6–6.5)

## 2021-03-23 NOTE — Assessment & Plan Note (Signed)
Pt given pt assistance forms for symbicort

## 2021-03-23 NOTE — Assessment & Plan Note (Signed)
Well controlled, no changes to meds. Encouraged heart healthy diet such as the DASH diet and exercise as tolerated.  °

## 2021-03-23 NOTE — Assessment & Plan Note (Signed)
Check labs, hgba1c to be checked, minimize simple carbs. Increase exercise as tolerated. Continue current meds

## 2021-03-23 NOTE — Assessment & Plan Note (Signed)
Encouraged heart healthy diet, increase exercise, avoid trans fats, consider a krill oil cap daily 

## 2021-03-23 NOTE — Patient Instructions (Signed)
Preventive Care 75 Years and Older, Female Preventive care refers to lifestyle choices and visits with your health care provider that can promote health and wellness. This includes:  A yearly physical exam. This is also called an annual wellness visit.  Regular dental and eye exams.  Immunizations.  Screening for certain conditions.  Healthy lifestyle choices, such as: ? Eating a healthy diet. ? Getting regular exercise. ? Not using drugs or products that contain nicotine and tobacco. ? Limiting alcohol use. What can I expect for my preventive care visit? Physical exam Your health care provider will check your:  Height and weight. These may be used to calculate your BMI (body mass index). BMI is a measurement that tells if you are at a healthy weight.  Heart rate and blood pressure.  Body temperature.  Skin for abnormal spots. Counseling Your health care provider may ask you questions about your:  Past medical problems.  Family's medical history.  Alcohol, tobacco, and drug use.  Emotional well-being.  Home life and relationship well-being.  Sexual activity.  Diet, exercise, and sleep habits.  History of falls.  Memory and ability to understand (cognition).  Work and work Statistician.  Pregnancy and menstrual history.  Access to firearms. What immunizations do I need? Vaccines are usually given at various ages, according to a schedule. Your health care provider will recommend vaccines for you based on your age, medical history, and lifestyle or other factors, such as travel or where you work.   What tests do I need? Blood tests  Lipid and cholesterol levels. These may be checked every 5 years, or more often depending on your overall health.  Hepatitis C test.  Hepatitis B test. Screening  Lung cancer screening. You may have this screening every year starting at age 75 if you have a 30-pack-year history of smoking and currently smoke or have quit within  the past 15 years.  Colorectal cancer screening. ? All adults should have this screening starting at age 44 and continuing until age 58. ? Your health care provider may recommend screening at age 2 if you are at increased risk. ? You will have tests every 1-10 years, depending on your results and the type of screening test.  Diabetes screening. ? This is done by checking your blood sugar (glucose) after you have not eaten for a while (fasting). ? You may have this done every 1-3 years.  Mammogram. ? This may be done every 1-2 years. ? Talk with your health care provider about how often you should have regular mammograms.  Abdominal aortic aneurysm (AAA) screening. You may need this if you are a current or former smoker.  BRCA-related cancer screening. This may be done if you have a family history of breast, ovarian, tubal, or peritoneal cancers. Other tests  STD (sexually transmitted disease) testing, if you are at risk.  Bone density scan. This is done to screen for osteoporosis. You may have this done starting at age 75. Talk with your health care provider about your test results, treatment options, and if necessary, the need for more tests. Follow these instructions at home: Eating and drinking  Eat a diet that includes fresh fruits and vegetables, whole grains, lean protein, and low-fat dairy products. Limit your intake of foods with high amounts of sugar, saturated fats, and salt.  Take vitamin and mineral supplements as recommended by your health care provider.  Do not drink alcohol if your health care provider tells you not to drink.  If you drink alcohol: ? Limit how much you have to 0-1 drink a day. ? Be aware of how much alcohol is in your drink. In the [place] one drink equals one 12 oz bottle of beer (355 mL), one 5 oz glass of wine (148 mL), or one 1 oz glass of hard liquor (44 mL).   Lifestyle  Take daily care of your teeth and gums. Brush your teeth every morning  and night with fluoride toothpaste. Floss one time each day.  Stay active. Exercise for at least 30 minutes 5 or more days each week.  Do not use any products that contain nicotine or tobacco, such as cigarettes, e-cigarettes, and chewing tobacco. If you need help quitting, ask your health care provider.  Do not use drugs.  If you are sexually active, practice safe sex. Use a condom or other form of protection in order to prevent STIs (sexually transmitted infections).  Talk with your health care provider about taking a low-dose aspirin or statin.  Find healthy ways to cope with stress, such as: ? Meditation, yoga, or listening to music. ? Journaling. ? Talking to a trusted person. ? Spending time with friends and family. Safety  Always wear your seat belt while driving or riding in a vehicle.  Do not drive: ? If you have been drinking alcohol. Do not ride with someone who has been drinking. ? When you are tired or distracted. ? While texting.  Wear a helmet and other protective equipment during sports activities.  If you have firearms in your house, make sure you follow all gun safety procedures. What's next?  Visit your health care provider once a year for an annual wellness visit.  Ask your health care provider how often you should have your eyes and teeth checked.  Stay up to date on all vaccines. This information is not intended to replace advice given to you by your health care provider. Make sure you discuss any questions you have with your health care provider. Document Revised: 09/27/2020 Document Reviewed: 10/01/2018 [employer] Patient Education  2021 [employer]  

## 2021-03-23 NOTE — Progress Notes (Signed)
Patient ID: Michelle Long, female    DOB: [birth year]  Age: 75 y.o. MRN: [id number]    Subjective:  Subjective  HPI [name] presents for a comprehensive physical examination today. She complains of skin growth on the tip of her nose. She endorses taking 50 mg of losartan PO Daily for her dx of HTN.  She states her BP is "good".   We are f/u on bp ,chol and dm as well .    BP Readings from Last 3 Encounters:  03/23/21 120/70  03/12/21 120/76  03/09/21 124/68  She notes that she had a R breast lumpectomy on 03/09/2021. She reports that the lump isn't pre- cancerous or cancerous, however it acts like cancer due to its fast growth. She states that her at home blood sugar level is 41-61. She states that she has been taking 1000 mg of metFORMIN PO Daily for her dx of type two diabetes. Lab Results  Component Value Date   HGBA1C 6.6 (H) 07/17/2020  She denies any chest pain, falls, SOB, fever, abdominal pain, cough, chills, sore throat, dysuria, urinary incontinence, back pain, HA, or N/V/D at this time.  She notes not seeing the podiatrist, however she has been getting pedicure. She has a FMHx of cancer. Her sister has breast x1 years and now has abdominal cancer. Her mother has a hx of breast cancer. Pt is requesting for alternative medication to Symbicort due to its price. She states that she has been walking, when the weather isn't hot. Pt agrees to pneumonia vaccination today.   Review of Systems  Constitutional: Negative for chills, fatigue and fever.  HENT: Negative for ear pain, rhinorrhea, sinus pressure, sinus pain, sore throat and tinnitus.   Eyes: Negative for pain.  Respiratory: Negative for cough, shortness of breath and wheezing.   Cardiovascular: Negative for chest pain.  Gastrointestinal: Negative for abdominal pain, anal bleeding, constipation, diarrhea, nausea and vomiting.  Genitourinary: Negative for flank pain.  Musculoskeletal: Negative for back pain and neck pain.  Skin:  Negative for rash.       (+) skin growth on nose   Neurological: Negative for seizures, weakness, light-headedness, numbness and headaches.    History Past Medical History:  Diagnosis Date  . Anxiety   . Asthma   . Bipolar disorder ([place])   . Bladder cancer ([place])   . Breast cancer ([place])   . Cancer of lower-inner quadrant of left female breast ([place]) 12/08/2013  . COPD (chronic obstructive pulmonary disease) ([place])    former smoker, quit 1999  . Depression   . Diabetes mellitus   . GERD (gastroesophageal reflux disease)   . Hepatitis B infection   . History of transfusion of whole blood    Approximately 2009, due to breast cancer/chemo.  Marland Kitchen Hyperlipidemia   . Hypertension   . Personal history of chemotherapy   . Personal history of radiation therapy   . PONV (postoperative nausea and vomiting)     She has a past surgical history that includes Tonsillectomy (1951); Appendectomy (1961); Partial hysterectomy (1991); bladder cancer (2006); Abdominal hysterectomy; Breast lumpectomy (Left, 2007); and Breast lumpectomy with radioactive seed localization (Right, 03/09/2021).   Her family history includes Arthritis in her mother; Breast cancer in her mother and sister; Cancer in her father; Diabetes in her father; Heart disease in her father; Hypertension in her mother; Throat cancer in her father; Transient ischemic attack in her mother.She reports that she quit smoking about 22 years ago. Her smoking use  included cigarettes. She started smoking about 56 years ago. She has a 49.50 pack-year smoking history. She has never used smokeless tobacco. She reports previous alcohol use. She reports that she does not use drugs.  Current Outpatient Medications on File Prior to Visit  Medication Sig Dispense Refill  . albuterol (VENTOLIN HFA) 108 (90 Base) MCG/ACT inhaler INHALE TWO PUFFS INTO LUNGS EVERY 6 HOURS AS NEEDED FOR WHEEZING OR SHORTNESS OF BREATH 18 g 5  . alendronate (FOSAMAX) 70 MG tablet  TAKE 1 TABLET BY MOUTH ONCE A WEEK WITH A FULL GLASS OF WATER ON AN EMPTY STOMACH 12 tablet 3  . aspirin 81 MG tablet Take 81 mg by mouth daily.    Marland Kitchen b complex vitamins tablet Take 1 tablet by mouth daily.    . Blood Glucose Monitoring Suppl (TRUE METRIX METER) w/Device KIT Check sugars once daily as directed. E11.9 1 kit 0  . budesonide-formoterol (SYMBICORT) 160-4.5 MCG/ACT inhaler Inhale 2 puffs into the lungs in the morning and at bedtime. 30.6 g 1  . Calcium Carb-Cholecalciferol (CALCIUM 1000 + D PO) Take by mouth 2 (two) times daily.    . Cholecalciferol (VITAMIN D) 2000 units CAPS Take 2,000 Units by mouth daily.    . citalopram (CELEXA) 10 MG tablet Take 0.5 tablets (5 mg total) by mouth at bedtime. 45 tablet 1  . exemestane (AROMASIN) 25 MG tablet Take 1 tablet (25 mg total) by mouth daily after breakfast. 90 tablet 12  . ezetimibe (ZETIA) 10 MG tablet Take 1 tablet (10 mg total) by mouth daily. 90 tablet 1  . glucose blood (TRUE METRIX BLOOD GLUCOSE TEST) test strip TEST BLOOD SUGAR EVERY DAY.  Dx code: E11.65 100 strip 1  . LORazepam (ATIVAN) 0.5 MG tablet Take 1 tablet (0.5 mg total) by mouth 2 (two) times daily as needed. 90 tablet 0  . losartan (COZAAR) 50 MG tablet Take 1 tablet (50 mg total) by mouth daily. 90 tablet 1  . metFORMIN (GLUCOPHAGE) 1000 MG tablet Take 1 tablet (1,000 mg total) by mouth 2 (two) times daily with a meal. 180 tablet 1  . Multiple Vitamin (MULTIVITAMIN) tablet Take 1 tablet by mouth daily.    . Multiple Vitamins-Minerals (HAIR SKIN AND NAILS FORMULA) TABS Take 1 tablet by mouth daily. 2,500 mg daily    . OLANZapine (ZYPREXA) 5 MG tablet 1/2 tablet daily (Patient taking differently: 5 mg daily.) 45 tablet 1  . pantoprazole (PROTONIX) 40 MG tablet Take 1 tablet (40 mg total) by mouth daily. 90 tablet 1  . rosuvastatin (CRESTOR) 5 MG tablet TAKE 1 TABLET THREE TIMES WEEKLY 90 tablet 0  . Spacer/Aero Chamber Mouthpiece MISC To use with inhaler 1 each 1  .  TRUEplus Lancets 33G MISC TEST ONE TIME DAILY AS DIRECTED.  Dx code: E11.65 100 each 1   No current facility-administered medications on file prior to visit.      Health Maintenance  Topic Date Due  . Pneumococcal Vaccine 52-43 Years old (1 of 4 - PCV13) Never done  . Zoster Vaccines- Shingrix (2 of 2) 09/19/2019  . HEMOGLOBIN A1C  01/14/2021  . INFLUENZA VACCINE  05/21/2021  . OPHTHALMOLOGY EXAM  09/19/2021  . FOOT EXAM  03/23/2022  . MAMMOGRAM  07/03/2022  . COLONOSCOPY (Pts 45-59yr Insurance coverage will need to be confirmed)  06/04/2023  . TETANUS/TDAP  09/19/2025  . DEXA SCAN  Completed  . COVID-19 Vaccine  Completed  . Hepatitis C Screening  Completed  . PNA  vac Low Risk Adult  Completed  . HPV VACCINES  Aged Out    Objective:  Objective  Physical Exam Vitals and nursing note reviewed.  Constitutional:      General: She is not in acute distress.    Appearance: Normal appearance. She is well-developed. She is not ill-appearing.  HENT:     Head: Normocephalic and atraumatic.     Right Ear: External ear normal.     Left Ear: External ear normal.     Nose: Nose normal.  Eyes:     General:        Right eye: No discharge.        Left eye: No discharge.     Extraocular Movements: Extraocular movements intact.     Pupils: Pupils are equal, round, and reactive to light.  Cardiovascular:     Rate and Rhythm: Normal rate and regular rhythm.     Pulses: Normal pulses.     Heart sounds: Normal heart sounds. No murmur heard. No friction rub. No gallop.   Pulmonary:     Effort: Pulmonary effort is normal. No respiratory distress.     Breath sounds: Normal breath sounds. No stridor. No wheezing, rhonchi or rales.  Chest:     Chest wall: No tenderness.    Abdominal:     General: Bowel sounds are normal. There is no distension.     Palpations: Abdomen is soft. There is no mass.     Tenderness: There is no abdominal tenderness. There is no guarding or rebound.     Hernia:  No hernia is present.  Musculoskeletal:        General: Normal range of motion.     Cervical back: Normal range of motion and neck supple.     Right lower leg: No edema.     Left lower leg: No edema.  Skin:    General: Skin is warm and dry.     Comments: There is a 0.5 cm flesh like mole present on the tip of the nose.   Neurological:     Mental Status: She is alert and oriented to person, place, and time.  Psychiatric:        Behavior: Behavior normal.        Thought Content: Thought content normal.    Diabetic Foot Exam - Simple   Simple Foot Form Diabetic Foot exam was performed with the following findings: Yes 03/23/2021  1:22 PM  Visual Inspection No deformities, no ulcerations, no other skin breakdown bilaterally: Yes Sensation Testing Intact to touch and monofilament testing bilaterally: Yes Pulse Check Posterior Tibialis and Dorsalis pulse intact bilaterally: Yes Comments     BP 120/70 (BP Location: Right Arm, Patient Position: Sitting, Cuff Size: Normal)   Pulse (!) 106   Temp 98.5 F (36.9 C) (Oral)   Resp 18   Ht _0  (1.6 m)   Wt 154 lb 3.2 oz (69.9 kg)   SpO2 94%   BMI 27.32 kg/m  Wt Readings from Last 3 Encounters:  03/23/21 154 lb 3.2 oz (69.9 kg)  03/12/21 157 lb (71.2 kg)  03/09/21 154 lb 5.2 oz (70 kg)     Lab Results  Component Value Date   WBC 5.8 03/12/2021   HGB 13.9 03/12/2021   HCT 43.2 03/12/2021   PLT 170 03/12/2021   GLUCOSE 121 (H) 03/12/2021   CHOL 141 07/17/2020   TRIG 151 (H) 07/17/2020   HDL 54 07/17/2020   LDLDIRECT 145.6 06/22/2013  LDLCALC 64 07/17/2020   ALT 36 03/12/2021   AST 33 03/12/2021   NA 141 03/12/2021   K 4.4 03/12/2021   CL 100 03/12/2021   CREATININE 0.81 03/12/2021   BUN 12 03/12/2021   CO2 33 (H) 03/12/2021   TSH 0.80 01/30/2015   INR 0.97 09/25/2011   HGBA1C 6.6 (H) 07/17/2020   MICROALBUR 2.2 07/17/2020    MM Breast Surgical Specimen  Result Date: 03/09/2021 CLINICAL DATA:  Evaluate surgical  specimen following excision of RIGHT breast CSL. EXAM: SPECIMEN RADIOGRAPH OF THE RIGHT BREAST COMPARISON:  Previous exam(s). FINDINGS: Status post excision of the RIGHT breast. The radioactive seed and CYLINDER biopsy marker clip are present and completely intact. IMPRESSION: Specimen radiograph of the RIGHT breast. Electronically Signed   By: Margarette Canada M.D.   On: 03/09/2021 11:40     Assessment & Plan:  Plan   No orders of the defined types were placed in this encounter.   Problem List Items Addressed This Visit      Unprioritized   RESOLVED: Acute bronchitis with COPD ([place])   Asthma    Pt given pt assistance forms for symbicort       Bipolar disorder [facility])    Per psych stable      Cancer of lower-inner quadrant of left female breast ([place]) (Chronic)    Per onc and surgery      Cardiomyopathy due to chemotherapy [facility])   Essential hypertension    Well controlled, no changes to meds. Encouraged heart healthy diet such as the DASH diet and exercise as tolerated.       Hyperlipidemia associated with type 2 diabetes mellitus ([place])    Encouraged heart healthy diet, increase exercise, avoid trans fats, consider a krill oil cap daily      Relevant Orders   Lipid panel   Hemoglobin A1c   CBC with Differential/Platelet   Comprehensive metabolic panel   Microalbumin / creatinine urine ratio   Preventative health care - Primary    ghm utd Check labs      Thrombocytopenia ([place])   Uncontrolled type 2 diabetes mellitus with hyperglycemia ([facility])    Check labs, hgba1c to be checked, minimize simple carbs. Increase exercise as tolerated. Continue current meds       Relevant Orders   Lipid panel   Hemoglobin A1c   CBC with Differential/Platelet   Comprehensive metabolic panel   Microalbumin / creatinine urine ratio    Other Visit Diagnoses    Primary hypertension       Relevant Orders   Lipid panel   Hemoglobin A1c   CBC with Differential/Platelet   Comprehensive  metabolic panel   Microalbumin / creatinine urine ratio   Bipolar 2 disorder (H[facility])   (Chronic)     Suspicious nevus       Relevant Orders   Ambulatory referral to Dermatology   Need for pneumococcal vaccination       Relevant Orders   Pneumococcal polysaccharide vaccine 23-valent greater than or equal to 2yo subcutaneous/IM (Completed)     Dexa: Last completed on 08/14/2020, osteopenic noted, repeat every 2 years.   Colonoscopy: Last completed on 01/15/2013, polyp noted, repeat every 5 years.   Mammo: Last completed on 07/03/2020, results were normal, repeat every 1 year.   Diabetic Foot Exam - Simple   Simple Foot Form Diabetic Foot exam was performed with the following findings: Yes 03/23/2021  1:22 PM  Visual Inspection No deformities, no ulcerations, no other skin breakdown  bilaterally: Yes Sensation Testing Intact to touch and monofilament testing bilaterally: Yes Pulse Check Posterior Tibialis and Dorsalis pulse intact bilaterally: Yes Comments     Follow-up: Return in about 6 months (around 09/22/2021), or if symptoms worsen or fail to improve, for hypertension, hyperlipidemia, diabetes II.   I,Gordon Zheng,acting as a scribe for Ann Held, DO.,have documented all relevant documentation on the behalf of Ann Held, DO,as directed by  Ann Held, DO while in the presence of [place], DO, have reviewed all documentation for this visit. The documentation on 03/23/21 for the exam, diagnosis, procedures, and orders are all accurate and complete.

## 2021-03-23 NOTE — Assessment & Plan Note (Signed)
Per onc and surgery.   

## 2021-03-23 NOTE — Assessment & Plan Note (Signed)
ghm utd Check labs  

## 2021-03-23 NOTE — Assessment & Plan Note (Signed)
Per psych stable

## 2021-03-26 LAB — LIPID PANEL
Cholesterol: 147 mg/dL (ref 0–200)
HDL: 47.1 mg/dL (ref >39.00)
LDL Cholesterol: 71 mg/dL (ref 0–99)
NonHDL: 100.32
Total CHOL/HDL Ratio: 3
Triglycerides: 146 mg/dL (ref 0.0–149.0)
VLDL: 29.2 mg/dL (ref 0.0–40.0)

## 2021-03-26 LAB — COMPREHENSIVE METABOLIC PANEL
ALT: 37 U/L — ABNORMAL HIGH (ref 0–35)
AST: 48 U/L — ABNORMAL HIGH (ref 0–37)
Albumin: 4.4 g/dL (ref 3.5–5.2)
Alkaline Phosphatase: 37 U/L — ABNORMAL LOW (ref 39–117)
BUN: 14 mg/dL (ref 6–23)
CO2: 26 mEq/L (ref 19–32)
Calcium: 9.9 mg/dL (ref 8.4–10.5)
Chloride: 101 mEq/L (ref 96–112)
Creatinine, Ser: 0.82 mg/dL (ref 0.40–1.20)
GFR: 70.19 mL/min (ref >60.00)
Glucose, Bld: 124 mg/dL — ABNORMAL HIGH (ref 70–99)
Potassium: 4.8 mEq/L (ref 3.5–5.1)
Sodium: 140 mEq/L (ref 135–145)
Total Bilirubin: 0.4 mg/dL (ref 0.2–1.2)
Total Protein: 7 g/dL (ref 6.0–8.3)

## 2021-03-26 LAB — MICROALBUMIN / CREATININE URINE RATIO
Creatinine,U: 178.6 mg/dL
Microalb Creat Ratio: 3.3 mg/g (ref 0.0–30.0)
Microalb, Ur: 5.9 mg/dL — ABNORMAL HIGH (ref 0.0–1.9)

## 2021-03-28 ENCOUNTER — Other Ambulatory Visit: Payer: Self-pay | Admitting: Family Medicine

## 2021-03-28 DIAGNOSIS — D485 Neoplasm of uncertain behavior of skin: Secondary | ICD-10-CM | POA: Diagnosis not present

## 2021-03-28 DIAGNOSIS — L820 Inflamed seborrheic keratosis: Secondary | ICD-10-CM | POA: Diagnosis not present

## 2021-03-31 ENCOUNTER — Other Ambulatory Visit: Payer: Self-pay | Admitting: Family Medicine

## 2021-03-31 DIAGNOSIS — E1165 Type 2 diabetes mellitus with hyperglycemia: Secondary | ICD-10-CM

## 2021-03-31 DIAGNOSIS — E1169 Type 2 diabetes mellitus with other specified complication: Secondary | ICD-10-CM

## 2021-04-02 ENCOUNTER — Encounter: Payer: Self-pay | Admitting: *Deleted

## 2021-04-02 ENCOUNTER — Other Ambulatory Visit: Payer: Self-pay | Admitting: *Deleted

## 2021-04-02 ENCOUNTER — Other Ambulatory Visit (HOSPITAL_BASED_OUTPATIENT_CLINIC_OR_DEPARTMENT_OTHER): Payer: Self-pay

## 2021-04-02 MED ORDER — LETROZOLE 2.5 MG PO TABS: 2.5000 mg | ORAL_TABLET | Freq: Every day | ORAL | 3 refills | Status: DC

## 2021-04-02 NOTE — Progress Notes (Signed)
Aromasin changed to Letrozole 2.5 mg daily per order of Dr. Marin Olp d/t increased cost.  Pt notified and appreciative of assistance.

## 2021-04-03 ENCOUNTER — Encounter: Payer: Self-pay | Admitting: Family Medicine

## 2021-04-03 ENCOUNTER — Other Ambulatory Visit: Payer: Self-pay

## 2021-04-03 MED ORDER — EMPAGLIFLOZIN 10 MG PO TABS: 10.0000 mg | ORAL_TABLET | Freq: Every day | ORAL | 2 refills | Status: DC

## 2021-04-04 ENCOUNTER — Other Ambulatory Visit: Payer: Self-pay | Admitting: Family Medicine

## 2021-04-16 ENCOUNTER — Other Ambulatory Visit: Payer: Self-pay

## 2021-04-16 ENCOUNTER — Other Ambulatory Visit: Payer: Self-pay | Admitting: Family Medicine

## 2021-04-16 DIAGNOSIS — K219 Gastro-esophageal reflux disease without esophagitis: Secondary | ICD-10-CM

## 2021-04-16 DIAGNOSIS — J4520 Mild intermittent asthma, uncomplicated: Secondary | ICD-10-CM

## 2021-04-16 MED ORDER — ROSUVASTATIN CALCIUM 5 MG PO TABS: ORAL_TABLET | ORAL | 3 refills | Status: DC

## 2021-04-16 MED ORDER — BUDESONIDE-FORMOTEROL FUMARATE 160-4.5 MCG/ACT IN AERO: 2.0000 | INHALATION_SPRAY | Freq: Two times a day (BID) | RESPIRATORY_TRACT | 1 refills | Status: DC

## 2021-04-26 ENCOUNTER — Inpatient Hospital Stay: Payer: Medicare HMO | Attending: Family Medicine

## 2021-04-26 ENCOUNTER — Other Ambulatory Visit: Payer: Self-pay

## 2021-04-26 ENCOUNTER — Encounter: Payer: Self-pay | Admitting: Hematology & Oncology

## 2021-04-26 ENCOUNTER — Inpatient Hospital Stay: Payer: Medicare HMO | Admitting: Hematology & Oncology

## 2021-04-26 ENCOUNTER — Telehealth: Payer: Self-pay | Admitting: *Deleted

## 2021-04-26 VITALS — BP 122/81 | HR 101 | Temp 98.2°F | Resp 18 | Ht 64.0 in | Wt 153.0 lb

## 2021-04-26 DIAGNOSIS — C50912 Malignant neoplasm of unspecified site of left female breast: Secondary | ICD-10-CM | POA: Insufficient documentation

## 2021-04-26 DIAGNOSIS — C50312 Malignant neoplasm of lower-inner quadrant of left female breast: Secondary | ICD-10-CM | POA: Diagnosis not present

## 2021-04-26 DIAGNOSIS — Z79811 Long term (current) use of aromatase inhibitors: Secondary | ICD-10-CM | POA: Insufficient documentation

## 2021-04-26 DIAGNOSIS — Z780 Asymptomatic menopausal state: Secondary | ICD-10-CM | POA: Diagnosis not present

## 2021-04-26 DIAGNOSIS — Z170 Estrogen receptor positive status [ER+]: Secondary | ICD-10-CM

## 2021-04-26 LAB — CBC WITH DIFFERENTIAL (CANCER CENTER ONLY)
Abs Immature Granulocytes: 0.02 10*3/uL (ref 0.00–0.07)
Basophils Absolute: 0 10*3/uL (ref 0.0–0.1)
Basophils Relative: 1 %
Eosinophils Absolute: 0.2 10*3/uL (ref 0.0–0.5)
Eosinophils Relative: 2 %
HCT: 44.1 % (ref 36.0–46.0)
Hemoglobin: 14 g/dL (ref 12.0–15.0)
Immature Granulocytes: 0 %
Lymphocytes Relative: 32 %
Lymphs Abs: 2.2 10*3/uL (ref 0.7–4.0)
MCH: 29.3 pg (ref 26.0–34.0)
MCHC: 31.7 g/dL (ref 30.0–36.0)
MCV: 92.3 fL (ref 80.0–100.0)
Monocytes Absolute: 0.6 10*3/uL (ref 0.1–1.0)
Monocytes Relative: 9 %
Neutro Abs: 3.9 10*3/uL (ref 1.7–7.7)
Neutrophils Relative %: 56 %
Platelet Count: 190 10*3/uL (ref 150–400)
RBC: 4.78 MIL/uL (ref 3.87–5.11)
RDW: 13.2 % (ref 11.5–15.5)
WBC Count: 6.9 10*3/uL (ref 4.0–10.5)
nRBC: 0 % (ref 0.0–0.2)

## 2021-04-26 LAB — CMP (CANCER CENTER ONLY)
ALT: 46 U/L — ABNORMAL HIGH (ref 0–44)
AST: 38 U/L (ref 15–41)
Albumin: 4.6 g/dL (ref 3.5–5.0)
Alkaline Phosphatase: 35 U/L — ABNORMAL LOW (ref 38–126)
Anion gap: 9 (ref 5–15)
BUN: 13 mg/dL (ref 8–23)
CO2: 29 mmol/L (ref 22–32)
Calcium: 10 mg/dL (ref 8.9–10.3)
Chloride: 101 mmol/L (ref 98–111)
Creatinine: 0.83 mg/dL (ref 0.44–1.00)
GFR, Estimated: >60 mL/min (ref >60)
Glucose, Bld: 120 mg/dL — ABNORMAL HIGH (ref 70–99)
Potassium: 4.2 mmol/L (ref 3.5–5.1)
Sodium: 139 mmol/L (ref 135–145)
Total Bilirubin: 0.5 mg/dL (ref 0.3–1.2)
Total Protein: 6.9 g/dL (ref 6.5–8.1)

## 2021-04-26 LAB — LACTATE DEHYDROGENASE: LDH: 177 U/L (ref 98–192)

## 2021-04-26 NOTE — Telephone Encounter (Signed)
Per 04/26/21 gave patient upcoming appointment - confirmed - view mychart

## 2021-04-26 NOTE — Progress Notes (Signed)
Hematology and Oncology Follow Up Visit  Michelle Long [id number] 04-Feb-1946 75 y.o. 04/26/2021   Principle Diagnosis:  Stage IIA (T2 N0M0) infiltrating ductal carcinoma of the left breast Usual Ductal Hyperplasia -- Complex sclerosing    Current Therapy:   Tamoxifen 20 mg by mouth daily - finished in September 2017 Femara 2.5 mg p.o. daily --started on 04/02/2021      Interim History:  [name] is here for follow-up.  She seems to be doing pretty well.  We have her on Femara.  I thought that we will initially try Aromasin.  She seemed to have some problems with the Aromasin.  She does have diabetes.  She is now on Jardiance.  Her family doctor is managing this.  She has had no issues with the lumpectomy that she had in the right breast.  She has had no pain.  There is no discharge.  She has had no swelling.  She had a quiet July 4 holiday.  She has had no issues with fever.  She has had no cough or shortness of breath.  There is no joint pain.  She has no arthralgias.  There is no leg swelling.  Overall, her performance status is ECOG 1.     Medications:  Allergies as of 04/26/2021       Reactions   Fluoxetine Other (See Comments)   seizure   Lamotrigine Other (See Comments), Itching   itch itch   Sulfa Antibiotics Other (See Comments), Shortness Of Breath   Dyspnea   Sulfa Antibiotics    Statins Other (See Comments)   Muscle pains Muscle pains Muscle pains Muscle pain   Fluoxetine Hcl Other (See Comments)   seizure   Sulfa Drugs Cross Reactors    Dyspnea        Medication List        Accurate as of April 26, 2021  1:18 PM. If you have any questions, ask your nurse or doctor.          albuterol 108 (90 Base) MCG/ACT inhaler Commonly known as: Ventolin HFA INHALE TWO PUFFS INTO LUNGS EVERY 6 HOURS AS NEEDED FOR WHEEZING OR SHORTNESS OF BREATH   alendronate 70 MG tablet Commonly known as: FOSAMAX TAKE 1 TABLET BY MOUTH ONCE A WEEK WITH A FULL GLASS OF  WATER ON AN EMPTY STOMACH   aspirin 81 MG tablet Take 81 mg by mouth daily.   b complex vitamins tablet Take 1 tablet by mouth daily.   budesonide-formoterol 160-4.5 MCG/ACT inhaler Commonly known as: Symbicort Inhale 2 puffs into the lungs in the morning and at bedtime.   CALCIUM 1000 + D PO Take by mouth 2 (two) times daily.   citalopram 10 MG tablet Commonly known as: CELEXA Take 0.5 tablets (5 mg total) by mouth at bedtime.   empagliflozin 10 MG Tabs tablet Commonly known as: Jardiance Take 1 tablet (10 mg total) by mouth daily before breakfast.   ezetimibe 10 MG tablet Commonly known as: ZETIA Take 1 tablet (10 mg total) by mouth daily.   Hair Skin and Nails Formula Tabs Take 1 tablet by mouth daily. 2,500 mg daily   letrozole 2.5 MG tablet Commonly known as: FEMARA Take 1 tablet (2.5 mg total) by mouth daily.   LORazepam 0.5 MG tablet Commonly known as: ATIVAN Take 1 tablet (0.5 mg total) by mouth 2 (two) times daily as needed.   losartan 50 MG tablet Commonly known as: COZAAR Take 1 tablet (50 mg total)  by mouth daily.   metFORMIN 1000 MG tablet Commonly known as: GLUCOPHAGE Take 1 tablet (1,000 mg total) by mouth 2 (two) times daily with a meal.   multivitamin tablet Take 1 tablet by mouth daily.   OLANZapine 5 MG tablet Commonly known as: ZYPREXA 1/2 tablet daily What changed:  how much to take when to take this additional instructions   pantoprazole 40 MG tablet Commonly known as: PROTONIX TAKE 1 TABLET EVERY DAY   rosuvastatin 5 MG tablet Commonly known as: CRESTOR TAKE 1 TABLET THREE TIMES WEEKLY   Spacer/Aero Chamber Mouthpiece Misc To use with inhaler   True Metrix Blood Glucose Test test strip Generic drug: glucose blood TEST BLOOD SUGAR EVERY DAY   True Metrix Meter w/Device Kit Check sugars once daily as directed. E11.9   TRUEplus Lancets 33G Misc TEST BLOOD SUGAR EVERY DAY   Vitamin D 50 MCG (2000 UT) Caps Take 2,000  Units by mouth daily.        Allergies:  Allergies  Allergen Reactions   Fluoxetine Other (See Comments)    seizure   Lamotrigine Other (See Comments) and Itching    itch itch   Sulfa Antibiotics Other (See Comments) and Shortness Of Breath    Dyspnea   Sulfa Antibiotics    Statins Other (See Comments)    Muscle pains Muscle pains Muscle pains Muscle pain   Fluoxetine Hcl Other (See Comments)    seizure   Sulfa Drugs Cross Reactors     Dyspnea     Past Medical History, Surgical history, Social history, and Family History were reviewed and updated.  Review of Systems: Review of Systems  Constitutional: Negative.   HENT: Negative.    Eyes: Negative.   Respiratory: Negative.    Cardiovascular: Negative.   Gastrointestinal: Negative.   Genitourinary: Negative.   Musculoskeletal: Negative.   Skin: Negative.   Neurological: Negative.   Endo/Heme/Allergies: Negative.   Psychiatric/Behavioral: Negative.      Physical Exam:  height is '5\' 4"'  (1.626 m) and weight is 153 lb (69.4 kg). Her oral temperature is 98.2 F (36.8 C). Her blood pressure is 122/81 and her pulse is 101 (abnormal). Her respiration is 18 and oxygen saturation is 97%.   Wt Readings from Last 3 Encounters:  04/26/21 153 lb (69.4 kg)  03/23/21 154 lb 3.2 oz (69.9 kg)  03/12/21 157 lb (71.2 kg)    Physical Exam Vitals reviewed.  Constitutional:      Comments: Her breast exam shows her right breast with no masses, edema or erythema. There is no right axillary adenopathy. Left breast is somewhat contracted from surgery and radiation. She has a well-healed lumpectomy scar at the 6:00 position. There is some slight firmness at the lumpectomy site. There is no distinct mass. There is no left axillary adenopathy.  HENT:     Head: Normocephalic and atraumatic.  Eyes:     Pupils: Pupils are equal, round, and reactive to light.  Cardiovascular:     Rate and Rhythm: Normal rate and regular rhythm.      Heart sounds: Normal heart sounds.  Pulmonary:     Effort: Pulmonary effort is normal.     Breath sounds: Normal breath sounds.  Abdominal:     General: Bowel sounds are normal.     Palpations: Abdomen is soft.  Musculoskeletal:        General: No tenderness or deformity. Normal range of motion.     Cervical back: Normal range of motion.  Lymphadenopathy:     Cervical: No cervical adenopathy.  Skin:    General: Skin is warm and dry.     Findings: No erythema or rash.  Neurological:     Mental Status: She is alert and oriented to person, place, and time.  Psychiatric:        Behavior: Behavior normal.        Thought Content: Thought content normal.        Judgment: Judgment normal.   athy.   Lab Results  Component Value Date   WBC 6.9 04/26/2021   HGB 14.0 04/26/2021   HCT 44.1 04/26/2021   MCV 92.3 04/26/2021   PLT 190 04/26/2021   No results found for: FERRITIN, IRON, TIBC, UIBC, IRONPCTSAT Lab Results  Component Value Date   RBC 4.78 04/26/2021   No results found for: KPAFRELGTCHN, LAMBDASER, KAPLAMBRATIO No results found for: IGGSERUM, IGA, IGMSERUM No results found for: Odetta Pink, SPEI   Chemistry      Component Value Date/Time   NA 139 04/26/2021 1128   NA 139 07/02/2017 1445   NA 142 01/01/2017 1059   K 4.2 04/26/2021 1128   K 4.6 07/02/2017 1445   K 4.6 01/01/2017 1059   CL 101 04/26/2021 1128   CL 103 07/02/2017 1445   CL 105 01/20/2015 1240   CO2 29 04/26/2021 1128   CO2 28 07/02/2017 1445   CO2 26 01/01/2017 1059   BUN 13 04/26/2021 1128   BUN 10 07/02/2017 1445   BUN 10.6 01/01/2017 1059   CREATININE 0.83 04/26/2021 1128   CREATININE 0.68 07/17/2020 1105   CREATININE 0.8 01/01/2017 1059      Component Value Date/Time   CALCIUM 10.0 04/26/2021 1128   CALCIUM 10.3 07/02/2017 1445   CALCIUM 10.1 01/01/2017 1059   ALKPHOS 35 (L) 04/26/2021 1128   ALKPHOS 47 07/02/2017 1445   ALKPHOS 49  01/01/2017 1059   AST 38 04/26/2021 1128   AST 60 (H) 01/01/2017 1059   ALT 46 (H) 04/26/2021 1128   ALT 63 (H) 01/01/2017 1059   BILITOT 0.5 04/26/2021 1128   BILITOT 0.64 01/01/2017 1059     Impression and Plan: Ms. Heyboer is a most delightful 75 year old postmenopausal white female. She had stage IIA ductal carcinoma the left breast. She completed tamoxifen in September 2017.  Again, she now has this lesion that was removed.  It was a complex sclerosing lesion with usual ductal hyperplasia.  I feel that the Femara is not a bad idea for her.  Hopefully she will be able to tolerate this.  I would like to plan to get her back now after Labor Day.  I think we can wait the whole summer.  Hopefully she will have going on vacation.     Volanda Napoleon, MD 7/7/20221:18 PM

## 2021-05-03 ENCOUNTER — Telehealth: Payer: Self-pay | Admitting: Family Medicine

## 2021-05-03 NOTE — Telephone Encounter (Signed)
Patient states Astrazeneca need symbicort prescription to be fax to [phone]

## 2021-05-04 ENCOUNTER — Other Ambulatory Visit: Payer: Self-pay

## 2021-05-04 DIAGNOSIS — J4520 Mild intermittent asthma, uncomplicated: Secondary | ICD-10-CM

## 2021-05-04 MED ORDER — BUDESONIDE-FORMOTEROL FUMARATE 160-4.5 MCG/ACT IN AERO: 2.0000 | INHALATION_SPRAY | Freq: Two times a day (BID) | RESPIRATORY_TRACT | 1 refills | Status: DC

## 2021-05-04 NOTE — Telephone Encounter (Signed)
Rx faxed

## 2021-05-08 ENCOUNTER — Encounter: Payer: Self-pay | Admitting: Family Medicine

## 2021-05-10 MED ORDER — NATEGLINIDE 60 MG PO TABS: 60.0000 mg | ORAL_TABLET | Freq: Three times a day (TID) | ORAL | 3 refills | Status: DC

## 2021-05-14 ENCOUNTER — Other Ambulatory Visit: Payer: Self-pay | Admitting: Family Medicine

## 2021-05-14 ENCOUNTER — Other Ambulatory Visit: Payer: Self-pay

## 2021-05-14 MED ORDER — NATEGLINIDE 60 MG PO TABS
60.0000 mg | ORAL_TABLET | Freq: Three times a day (TID) | ORAL | 3 refills | Status: DC
Start: 2021-05-14 — End: 2021-07-25

## 2021-05-14 NOTE — Progress Notes (Signed)
Pt called and asked to be placed on lab schedule- I do not see that it is time for her 3 month labs yet. Can you check behind me.

## 2021-05-23 DIAGNOSIS — X32XXXS Exposure to sunlight, sequela: Secondary | ICD-10-CM | POA: Diagnosis not present

## 2021-05-23 DIAGNOSIS — D1801 Hemangioma of skin and subcutaneous tissue: Secondary | ICD-10-CM | POA: Diagnosis not present

## 2021-05-23 DIAGNOSIS — L814 Other melanin hyperpigmentation: Secondary | ICD-10-CM | POA: Diagnosis not present

## 2021-05-23 DIAGNOSIS — L821 Other seborrheic keratosis: Secondary | ICD-10-CM | POA: Diagnosis not present

## 2021-06-11 ENCOUNTER — Other Ambulatory Visit: Payer: Self-pay | Admitting: Hematology & Oncology

## 2021-06-11 DIAGNOSIS — Z09 Encounter for follow-up examination after completed treatment for conditions other than malignant neoplasm: Secondary | ICD-10-CM

## 2021-06-16 ENCOUNTER — Other Ambulatory Visit: Payer: Self-pay | Admitting: Family Medicine

## 2021-06-26 ENCOUNTER — Other Ambulatory Visit: Payer: Self-pay

## 2021-06-26 ENCOUNTER — Other Ambulatory Visit (INDEPENDENT_AMBULATORY_CARE_PROVIDER_SITE_OTHER): Payer: Medicare HMO

## 2021-06-26 DIAGNOSIS — E1165 Type 2 diabetes mellitus with hyperglycemia: Secondary | ICD-10-CM

## 2021-06-26 DIAGNOSIS — E1169 Type 2 diabetes mellitus with other specified complication: Secondary | ICD-10-CM | POA: Diagnosis not present

## 2021-06-26 DIAGNOSIS — E785 Hyperlipidemia, unspecified: Secondary | ICD-10-CM | POA: Diagnosis not present

## 2021-06-26 LAB — MICROALBUMIN / CREATININE URINE RATIO
Creatinine,U: 97.1 mg/dL
Microalb Creat Ratio: 1.5 mg/g (ref 0.0–30.0)
Microalb, Ur: 1.5 mg/dL (ref 0.0–1.9)

## 2021-06-26 LAB — LIPID PANEL
Cholesterol: 143 mg/dL (ref 0–200)
HDL: 48.9 mg/dL (ref >39.00)
LDL Cholesterol: 72 mg/dL (ref 0–99)
NonHDL: 93.8
Total CHOL/HDL Ratio: 3
Triglycerides: 110 mg/dL (ref 0.0–149.0)
VLDL: 22 mg/dL (ref 0.0–40.0)

## 2021-06-26 LAB — HEMOGLOBIN A1C: Hgb A1c MFr Bld: 6.7 % — ABNORMAL HIGH (ref 4.6–6.5)

## 2021-06-26 LAB — COMPREHENSIVE METABOLIC PANEL
ALT: 44 U/L — ABNORMAL HIGH (ref 0–35)
AST: 41 U/L — ABNORMAL HIGH (ref 0–37)
Albumin: 4.2 g/dL (ref 3.5–5.2)
Alkaline Phosphatase: 39 U/L (ref 39–117)
BUN: 12 mg/dL (ref 6–23)
CO2: 27 mEq/L (ref 19–32)
Calcium: 9.3 mg/dL (ref 8.4–10.5)
Chloride: 103 mEq/L (ref 96–112)
Creatinine, Ser: 0.79 mg/dL (ref 0.40–1.20)
GFR: 73.27 mL/min (ref >60.00)
Glucose, Bld: 128 mg/dL — ABNORMAL HIGH (ref 70–99)
Potassium: 4.6 mEq/L (ref 3.5–5.1)
Sodium: 140 mEq/L (ref 135–145)
Total Bilirubin: 0.4 mg/dL (ref 0.2–1.2)
Total Protein: 6.6 g/dL (ref 6.0–8.3)

## 2021-06-29 ENCOUNTER — Other Ambulatory Visit: Payer: Self-pay

## 2021-06-29 ENCOUNTER — Encounter: Payer: Self-pay | Admitting: Hematology & Oncology

## 2021-06-29 ENCOUNTER — Telehealth: Payer: Self-pay | Admitting: *Deleted

## 2021-06-29 ENCOUNTER — Inpatient Hospital Stay: Payer: Medicare HMO | Admitting: Hematology & Oncology

## 2021-06-29 ENCOUNTER — Inpatient Hospital Stay: Payer: Medicare HMO | Attending: Family Medicine

## 2021-06-29 VITALS — BP 137/66 | HR 96 | Temp 98.6°F | Resp 16 | Wt 152.0 lb

## 2021-06-29 DIAGNOSIS — E119 Type 2 diabetes mellitus without complications: Secondary | ICD-10-CM | POA: Insufficient documentation

## 2021-06-29 DIAGNOSIS — N6489 Other specified disorders of breast: Secondary | ICD-10-CM | POA: Insufficient documentation

## 2021-06-29 DIAGNOSIS — R945 Abnormal results of liver function studies: Secondary | ICD-10-CM | POA: Insufficient documentation

## 2021-06-29 DIAGNOSIS — N62 Hypertrophy of breast: Secondary | ICD-10-CM | POA: Diagnosis not present

## 2021-06-29 DIAGNOSIS — Z79811 Long term (current) use of aromatase inhibitors: Secondary | ICD-10-CM | POA: Insufficient documentation

## 2021-06-29 DIAGNOSIS — C50312 Malignant neoplasm of lower-inner quadrant of left female breast: Secondary | ICD-10-CM

## 2021-06-29 DIAGNOSIS — Z79899 Other long term (current) drug therapy: Secondary | ICD-10-CM | POA: Diagnosis not present

## 2021-06-29 LAB — CBC WITH DIFFERENTIAL (CANCER CENTER ONLY)
Abs Immature Granulocytes: 0.02 10*3/uL (ref 0.00–0.07)
Basophils Absolute: 0 10*3/uL (ref 0.0–0.1)
Basophils Relative: 1 %
Eosinophils Absolute: 0.1 10*3/uL (ref 0.0–0.5)
Eosinophils Relative: 2 %
HCT: 44.2 % (ref 36.0–46.0)
Hemoglobin: 14.3 g/dL (ref 12.0–15.0)
Immature Granulocytes: 0 %
Lymphocytes Relative: 28 %
Lymphs Abs: 1.9 10*3/uL (ref 0.7–4.0)
MCH: 29.7 pg (ref 26.0–34.0)
MCHC: 32.4 g/dL (ref 30.0–36.0)
MCV: 91.9 fL (ref 80.0–100.0)
Monocytes Absolute: 0.6 10*3/uL (ref 0.1–1.0)
Monocytes Relative: 9 %
Neutro Abs: 4.2 10*3/uL (ref 1.7–7.7)
Neutrophils Relative %: 60 %
Platelet Count: 199 10*3/uL (ref 150–400)
RBC: 4.81 MIL/uL (ref 3.87–5.11)
RDW: 13.5 % (ref 11.5–15.5)
WBC Count: 6.8 10*3/uL (ref 4.0–10.5)
nRBC: 0 % (ref 0.0–0.2)

## 2021-06-29 LAB — CMP (CANCER CENTER ONLY)
ALT: 50 U/L — ABNORMAL HIGH (ref 0–44)
AST: 41 U/L (ref 15–41)
Albumin: 4.7 g/dL (ref 3.5–5.0)
Alkaline Phosphatase: 37 U/L — ABNORMAL LOW (ref 38–126)
Anion gap: 10 (ref 5–15)
BUN: 13 mg/dL (ref 8–23)
CO2: 29 mmol/L (ref 22–32)
Calcium: 10.3 mg/dL (ref 8.9–10.3)
Chloride: 102 mmol/L (ref 98–111)
Creatinine: 0.83 mg/dL (ref 0.44–1.00)
GFR, Estimated: >60 mL/min (ref >60)
Glucose, Bld: 109 mg/dL — ABNORMAL HIGH (ref 70–99)
Potassium: 4.6 mmol/L (ref 3.5–5.1)
Sodium: 141 mmol/L (ref 135–145)
Total Bilirubin: 0.6 mg/dL (ref 0.3–1.2)
Total Protein: 7.1 g/dL (ref 6.5–8.1)

## 2021-06-29 LAB — LACTATE DEHYDROGENASE: LDH: 191 U/L (ref 98–192)

## 2021-06-29 NOTE — Telephone Encounter (Signed)
Per 06/29/21 los - gave upcoming appointments - confirmed - print calendar

## 2021-06-29 NOTE — Progress Notes (Signed)
Hematology and Oncology Follow Up Visit  NOAH PELAEZ [id number] [birth year] 75 y.o. 06/29/2021   Principle Diagnosis:  Stage IIA (T2 N0M0) infiltrating ductal carcinoma of the left breast Usual Ductal Hyperplasia -- Complex sclerosing    Current Therapy:   Tamoxifen 20 mg by mouth daily - finished in September 2017 Femara 2.5 mg p.o. daily --started on 04/02/2021      Interim History:  Ms. Stoneberg is here for follow-up.  She has had a nice summer.  She was down in [place] in July.  She had a nice time down there..  She is trying to stay active.  She has diabetes.  She is trying to keep her blood sugars down.  She has not on Jardiance.  She is not sure what she takes for her diabetes.  She is a little bit worried about her liver.  She was told that she had elevated liver function studies.  She does see gastroenterology.  The gastroenterologist wanted to do a liver biopsy.  Ms. Morikawa is not too sure she would like to have one done right now.  She is on Crestor for cholesterol.  I am sure this might be contributing to the elevated liver function studies.  Today, her liver function studies are minimally elevated at best.  She is on vitamin D.  She has had no problems with the Femara with respect to arthralgias.  She has had no fever.  There is been no cough or shortness of breath.  She has had no change in bowel or bladder habits.  There is been no rashes.  She has had no leg swelling.  She has had no headache.  Overall, her performance status is ECOG 1.     Medications:  Allergies as of 06/29/2021       Reactions   Fluoxetine Other (See Comments)   seizure   Lamotrigine Other (See Comments), Itching   itch itch   Sulfa Antibiotics Other (See Comments), Shortness Of Breath   Dyspnea   Sulfa Antibiotics    Statins Other (See Comments)   Muscle pains Muscle pains Muscle pains Muscle pain   Fluoxetine Hcl Other (See Comments)   seizure   Sulfa Drugs Cross Reactors    Dyspnea         Medication List        Accurate as of June 29, 2021 12:54 PM. If you have any questions, ask your nurse or doctor.          albuterol 108 (90 Base) MCG/ACT inhaler Commonly known as: Ventolin HFA INHALE TWO PUFFS INTO LUNGS EVERY 6 HOURS AS NEEDED FOR WHEEZING OR SHORTNESS OF BREATH   alendronate 70 MG tablet Commonly known as: FOSAMAX TAKE 1 TABLET BY MOUTH ONCE A WEEK WITH A FULL GLASS OF WATER ON AN EMPTY STOMACH   aspirin 81 MG tablet Take 81 mg by mouth daily.   b complex vitamins tablet Take 1 tablet by mouth daily.   budesonide-formoterol 160-4.5 MCG/ACT inhaler Commonly known as: Symbicort Inhale 2 puffs into the lungs in the morning and at bedtime.   CALCIUM 1000 + D PO Take by mouth 2 (two) times daily.   citalopram 10 MG tablet Commonly known as: CELEXA Take 0.5 tablets (5 mg total) by mouth at bedtime.   ezetimibe 10 MG tablet Commonly known as: ZETIA Take 1 tablet (10 mg total) by mouth daily.   Hair Skin and Nails Formula Tabs Take 1 tablet by mouth daily. 2,500 mg daily  letrozole 2.5 MG tablet Commonly known as: FEMARA Take 1 tablet (2.5 mg total) by mouth daily.   LORazepam 0.5 MG tablet Commonly known as: ATIVAN Take 1 tablet (0.5 mg total) by mouth 2 (two) times daily as needed.   losartan 50 MG tablet Commonly known as: COZAAR Take 1 tablet (50 mg total) by mouth daily.   metFORMIN 1000 MG tablet Commonly known as: GLUCOPHAGE Take 1 tablet (1,000 mg total) by mouth 2 (two) times daily with a meal.   multivitamin tablet Take 1 tablet by mouth daily.   nateglinide 60 MG tablet Commonly known as: Starlix Take 1 tablet (60 mg total) by mouth 3 (three) times daily with meals.   OLANZapine 5 MG tablet Commonly known as: ZYPREXA 1/2 tablet daily What changed:  how much to take when to take this additional instructions   pantoprazole 40 MG tablet Commonly known as: PROTONIX TAKE 1 TABLET EVERY DAY   rosuvastatin  5 MG tablet Commonly known as: CRESTOR TAKE 1 TABLET THREE TIMES WEEKLY   Spacer/Aero Chamber Mouthpiece Misc To use with inhaler   True Metrix Blood Glucose Test test strip Generic drug: glucose blood TEST BLOOD SUGAR EVERY DAY   True Metrix Meter w/Device Kit Check sugars once daily as directed. E11.9   TRUEplus Lancets 33G Misc TEST BLOOD SUGAR EVERY DAY   Vitamin D 50 MCG (2000 UT) Caps Take 2,000 Units by mouth daily.        Allergies:  Allergies  Allergen Reactions   Fluoxetine Other (See Comments)    seizure   Lamotrigine Other (See Comments) and Itching    itch itch   Sulfa Antibiotics Other (See Comments) and Shortness Of Breath    Dyspnea   Sulfa Antibiotics    Statins Other (See Comments)    Muscle pains Muscle pains Muscle pains Muscle pain   Fluoxetine Hcl Other (See Comments)    seizure   Sulfa Drugs Cross Reactors     Dyspnea     Past Medical History, Surgical history, Social history, and Family History were reviewed and updated.  Review of Systems: Review of Systems  Constitutional: Negative.   HENT: Negative.    Eyes: Negative.   Respiratory: Negative.    Cardiovascular: Negative.   Gastrointestinal: Negative.   Genitourinary: Negative.   Musculoskeletal: Negative.   Skin: Negative.   Neurological: Negative.   Endo/Heme/Allergies: Negative.   Psychiatric/Behavioral: Negative.      Physical Exam:  weight is 152 lb (68.9 kg). Her oral temperature is 98.6 F (37 C). Her blood pressure is 137/66 and her pulse is 96. Her respiration is 16.   Wt Readings from Last 3 Encounters:  06/29/21 152 lb (68.9 kg)  04/26/21 153 lb (69.4 kg)  03/23/21 154 lb 3.2 oz (69.9 kg)    Physical Exam Vitals reviewed.  Constitutional:      Comments: Her breast exam shows her right breast with no masses, edema or erythema. There is no right axillary adenopathy. Left breast is somewhat contracted from surgery and radiation. She has a well-healed  lumpectomy scar at the 6:00 position. There is some slight firmness at the lumpectomy site. There is no distinct mass. There is no left axillary adenopathy.  HENT:     Head: Normocephalic and atraumatic.  Eyes:     Pupils: Pupils are equal, round, and reactive to light.  Cardiovascular:     Rate and Rhythm: Normal rate and regular rhythm.     Heart sounds: Normal heart sounds.  Pulmonary:     Effort: Pulmonary effort is normal.     Breath sounds: Normal breath sounds.  Abdominal:     General: Bowel sounds are normal.     Palpations: Abdomen is soft.  Musculoskeletal:        General: No tenderness or deformity. Normal range of motion.     Cervical back: Normal range of motion.  Lymphadenopathy:     Cervical: No cervical adenopathy.  Skin:    General: Skin is warm and dry.     Findings: No erythema or rash.  Neurological:     Mental Status: She is alert and oriented to person, place, and time.  Psychiatric:        Behavior: Behavior normal.        Thought Content: Thought content normal.        Judgment: Judgment normal.   athy.   Lab Results  Component Value Date   WBC 6.8 06/29/2021   HGB 14.3 06/29/2021   HCT 44.2 06/29/2021   MCV 91.9 06/29/2021   PLT 199 06/29/2021   No results found for: FERRITIN, IRON, TIBC, UIBC, IRONPCTSAT Lab Results  Component Value Date   RBC 4.81 06/29/2021   No results found for: KPAFRELGTCHN, LAMBDASER, KAPLAMBRATIO No results found for: IGGSERUM, IGA, IGMSERUM No results found for: Odetta Pink, SPEI   Chemistry      Component Value Date/Time   NA 141 06/29/2021 1149   NA 139 07/02/2017 1445   NA 142 01/01/2017 1059   K 4.6 06/29/2021 1149   K 4.6 07/02/2017 1445   K 4.6 01/01/2017 1059   CL 102 06/29/2021 1149   CL 103 07/02/2017 1445   CL 105 01/20/2015 1240   CO2 29 06/29/2021 1149   CO2 28 07/02/2017 1445   CO2 26 01/01/2017 1059   BUN 13 06/29/2021 1149   BUN 10  07/02/2017 1445   BUN 10.6 01/01/2017 1059   CREATININE 0.83 06/29/2021 1149   CREATININE 0.68 07/17/2020 1105   CREATININE 0.8 01/01/2017 1059      Component Value Date/Time   CALCIUM 10.3 06/29/2021 1149   CALCIUM 10.3 07/02/2017 1445   CALCIUM 10.1 01/01/2017 1059   ALKPHOS 37 (L) 06/29/2021 1149   ALKPHOS 47 07/02/2017 1445   ALKPHOS 49 01/01/2017 1059   AST 41 06/29/2021 1149   AST 60 (H) 01/01/2017 1059   ALT 50 (H) 06/29/2021 1149   ALT 63 (H) 01/01/2017 1059   BILITOT 0.6 06/29/2021 1149   BILITOT 0.64 01/01/2017 1059     Impression and Plan: Ms. Hassebrock is a most delightful 75 year old postmenopausal white female. She had stage IIA ductal carcinoma the left breast. She completed tamoxifen in September 2017.  Again, she now has this lesion that was removed.  It was a complex sclerosing lesion with usual ductal hyperplasia.  We have her on Femara.  I think this is a good idea for her as a preventive measure.  Again, I really would not think that the liver is going be a problem for her.  I would like to see her back in 3 months.  Hopefully everything will settle down with her liver.  Hopefully her diabetes will not be an issue for her.    Volanda Napoleon, MD 9/9/202212:54 PM

## 2021-07-02 ENCOUNTER — Other Ambulatory Visit: Payer: Self-pay

## 2021-07-02 ENCOUNTER — Other Ambulatory Visit: Payer: Self-pay | Admitting: Hematology & Oncology

## 2021-07-02 DIAGNOSIS — N63 Unspecified lump in unspecified breast: Secondary | ICD-10-CM

## 2021-07-02 DIAGNOSIS — E1165 Type 2 diabetes mellitus with hyperglycemia: Secondary | ICD-10-CM

## 2021-07-02 MED ORDER — BD SWAB SINGLE USE REGULAR PADS: 1.0000 | MEDICATED_PAD | Freq: Two times a day (BID) | 0 refills | Status: DC

## 2021-07-02 MED ORDER — TRUE METRIX LEVEL 1 LOW VI SOLN: 1.0000 | Status: DC

## 2021-07-05 ENCOUNTER — Inpatient Hospital Stay: Admission: RE | Admit: 2021-07-05 | Payer: Medicare HMO | Source: Ambulatory Visit

## 2021-07-13 ENCOUNTER — Other Ambulatory Visit: Payer: Self-pay

## 2021-07-13 ENCOUNTER — Ambulatory Visit
Admission: RE | Admit: 2021-07-13 | Discharge: 2021-07-13 | Disposition: A | Discharge status: Home / Self Care | Payer: Medicare HMO | Source: Ambulatory Visit | Attending: Hematology & Oncology | Admitting: Hematology & Oncology

## 2021-07-13 DIAGNOSIS — N63 Unspecified lump in unspecified breast: Secondary | ICD-10-CM

## 2021-07-13 DIAGNOSIS — R922 Inconclusive mammogram: Secondary | ICD-10-CM | POA: Diagnosis not present

## 2021-07-13 IMAGING — MG DIGITAL DIAGNOSTIC BILAT W/ TOMO W/ CAD
9 series · 9 of 25 positions shown · non-contrast
Comparison: Previous exam(s).

CLINICAL DATA: 75-year-old female status post right breast excision
for IPHONEHENE in [DATE]. No current breast related issues.

EXAM:
DIGITAL DIAGNOSTIC BILATERAL MAMMOGRAM WITH TOMOSYNTHESIS AND CAD
TECHNIQUE: Bilateral digital diagnostic mammography and breast tomosynthesis
was performed. The images were evaluated with computer-aided
detection.

[R CC]
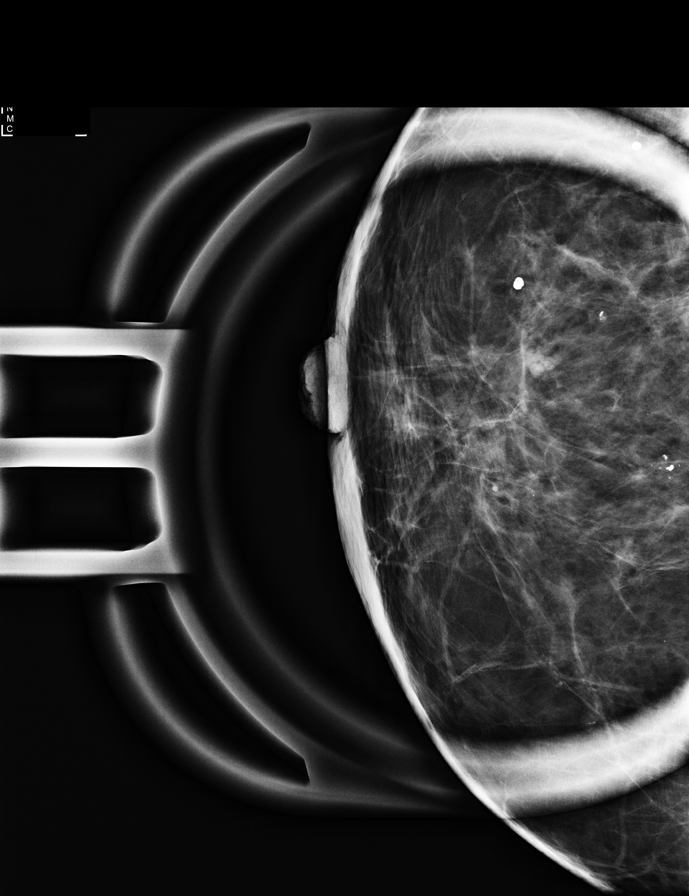

[L MLO synth-2D]
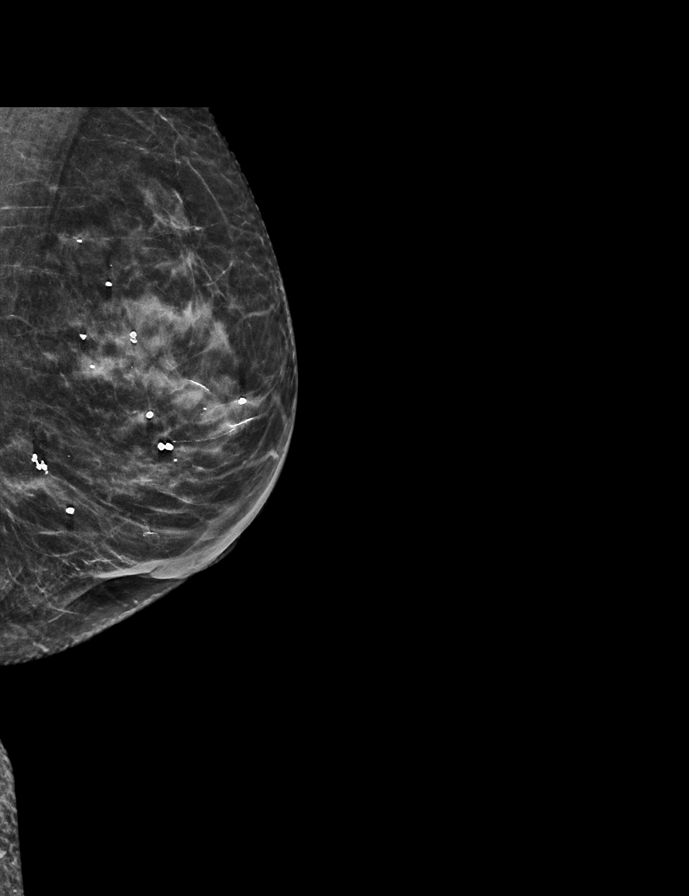

[L CC synth-2D]
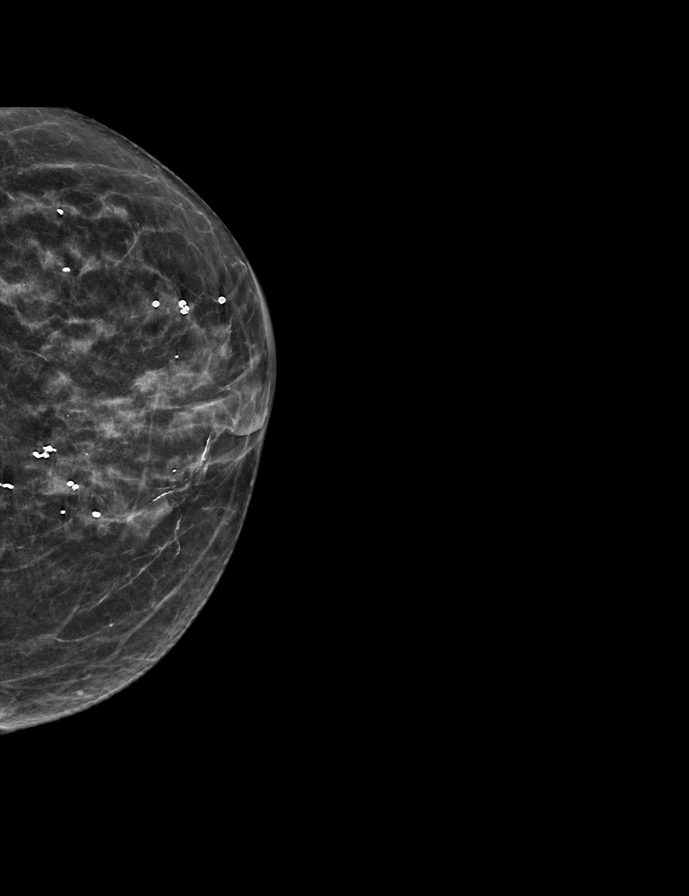

[R CC synth-2D]
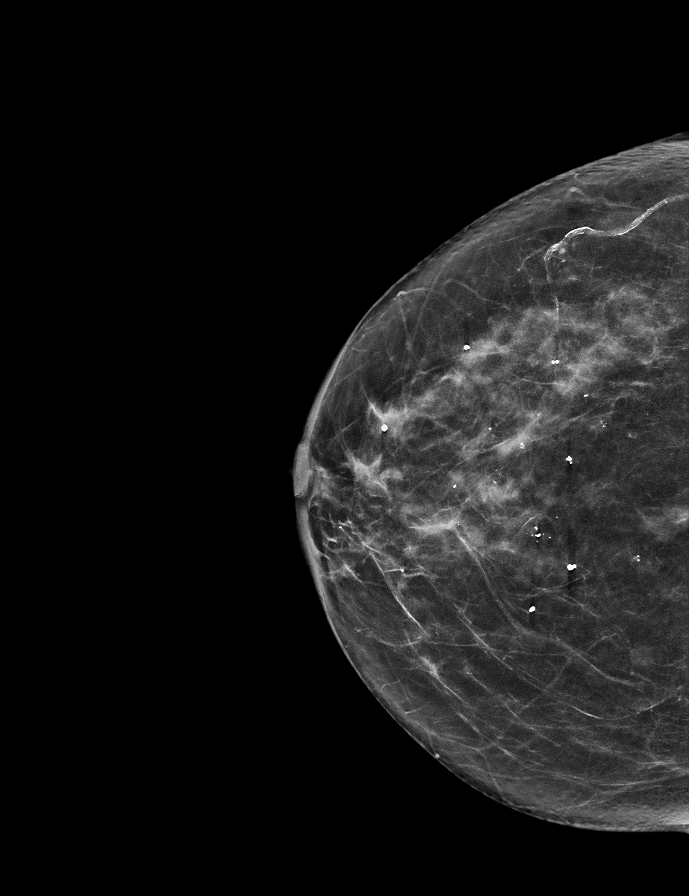

[R MLO synth-2D]
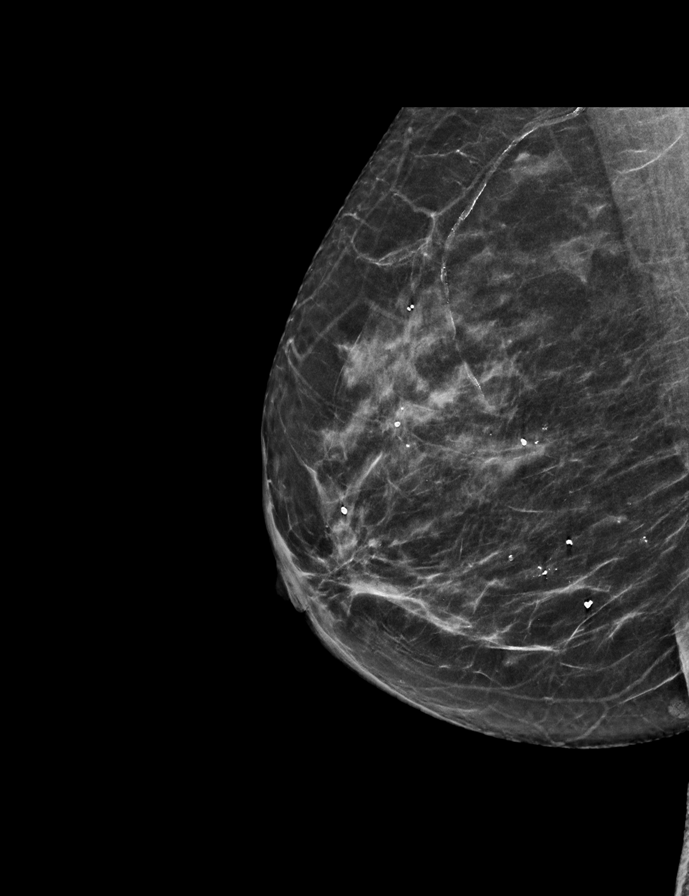

[L CC tomo · tomo slice 23/44.0]
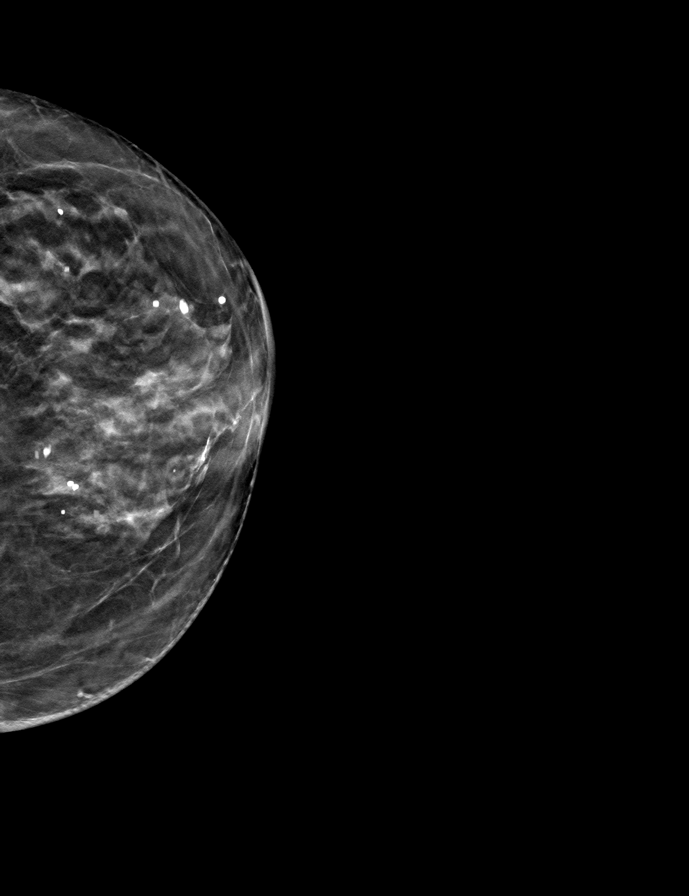

[R CC tomo · tomo slice 31/60.0]
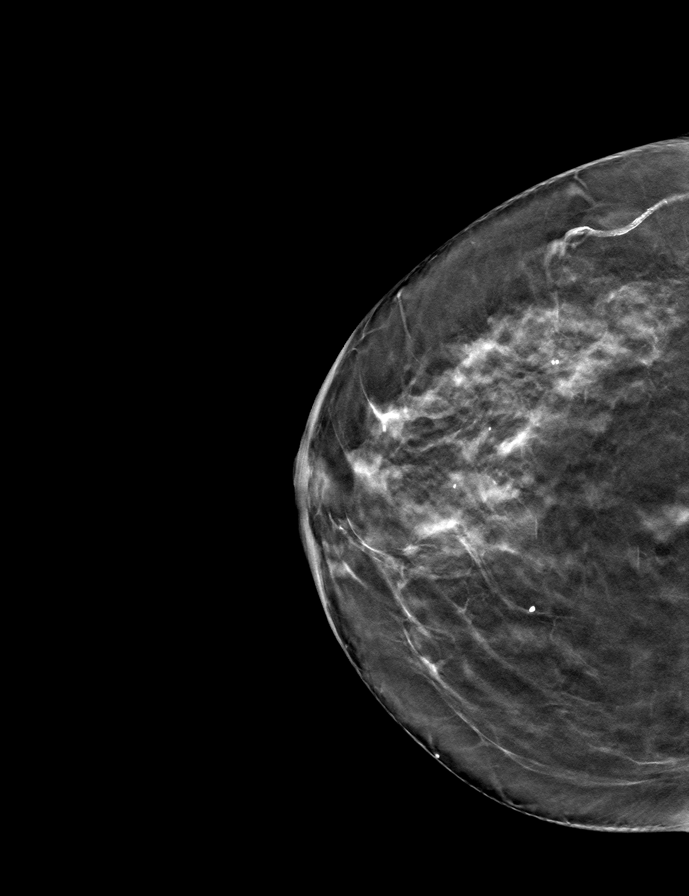

[L MLO tomo · tomo slice 25/48.0]
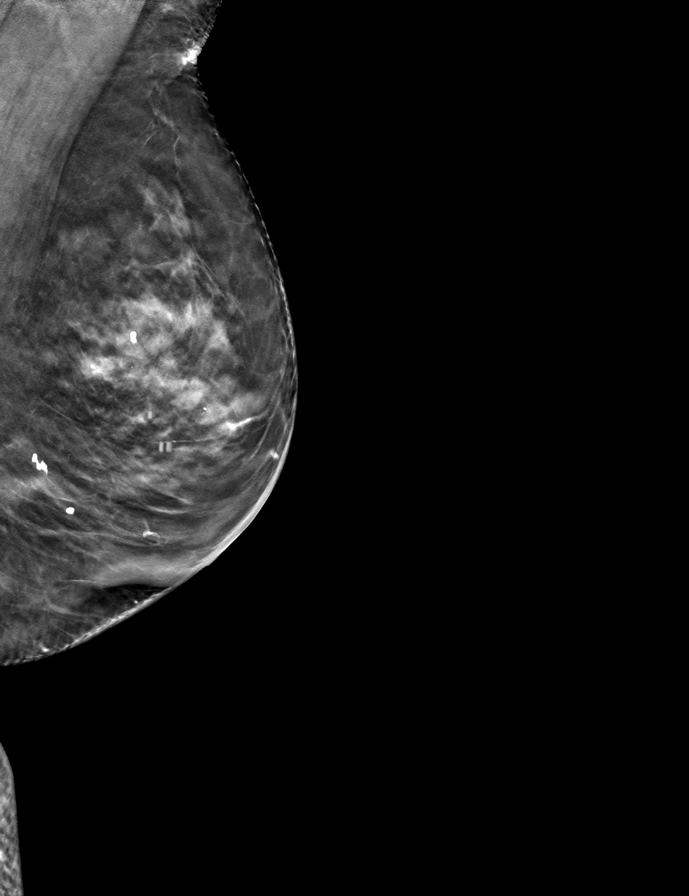

[R MLO tomo · tomo slice 31/60.0]
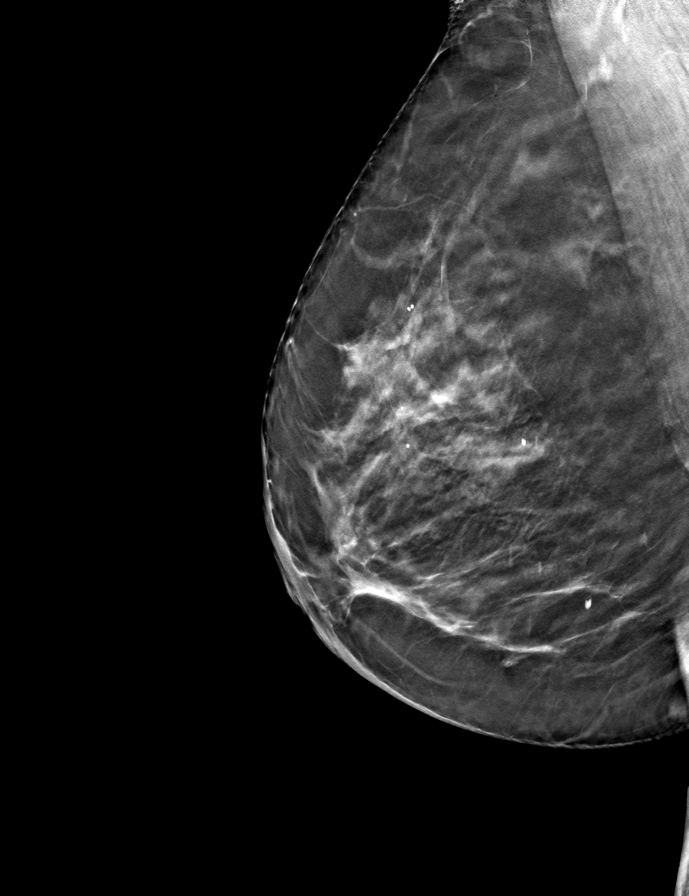

[9 of 25 positions shown; findings below may reference images not displayed]

ACR Breast Density Category c: The breast tissue is heterogeneously
dense, which may obscure small masses.
FINDINGS: Interval postsurgical changes in the subareolar right breast.
Otherwise, no new or suspicious findings in either breast.
IMPRESSION: No mammographic evidence of malignancy.

RECOMMENDATION:
1.  Screening mammogram in one year.(Code:[R6])
2. Recommendation stands for follow-up contrast enhanced breast MRI
as dictated in addendum on the report dated [DATE].

I have discussed the findings and recommendations with the patient.
If applicable, a reminder letter will be sent to the patient
regarding the next appointment.

BI-RADS CATEGORY  2: Benign.

## 2021-07-24 ENCOUNTER — Other Ambulatory Visit: Payer: Self-pay | Admitting: Family Medicine

## 2021-08-06 ENCOUNTER — Telehealth: Payer: Self-pay | Admitting: Family Medicine

## 2021-08-06 ENCOUNTER — Other Ambulatory Visit: Payer: Self-pay | Admitting: Family Medicine

## 2021-08-06 DIAGNOSIS — I10 Essential (primary) hypertension: Secondary | ICD-10-CM

## 2021-08-06 NOTE — Telephone Encounter (Signed)
Pharmacy states pt would like a refill metformin er 500. Pharmacist informed that the one we have on file is 1000 mg, they said she wanted 500.     Medication: metformin er 500  Has the patient contacted their pharmacy? Yes.   (If no, request that the patient contact the pharmacy for the refill.) (If yes, when and what did the pharmacy advise?)  Preferred Pharmacy (with phone number or street name): [place], [address]  [address], [place] [address]  Phone:  [phone]  Fax:  [phone]   Agent: Please be advised that RX refills may take up to 3 business days. We ask that you follow-up with your pharmacy.

## 2021-08-06 NOTE — Telephone Encounter (Signed)
Spoke with patient. Pt states having a on and off diarrhea and states the pharmacist recommended slow released Metformin. Please advise

## 2021-08-06 NOTE — Telephone Encounter (Signed)
Pt called. LVM to return call 

## 2021-08-06 NOTE — Telephone Encounter (Signed)
Pt wanting to decrease metformin from 1,000 MG to 500 MG. Please advise

## 2021-08-07 MED ORDER — METFORMIN HCL 1000 MG PO TABS: 1000.0000 mg | ORAL_TABLET | Freq: Two times a day (BID) | ORAL | 1 refills | Status: DC

## 2021-08-07 NOTE — Telephone Encounter (Signed)
Noted. Refill sent.

## 2021-08-07 NOTE — Telephone Encounter (Signed)
Pt. Stated she would just like to stay on the 1000mg . She didn't realize she would be dropped down to 500mg .

## 2021-08-10 ENCOUNTER — Other Ambulatory Visit: Payer: Self-pay | Admitting: Family Medicine

## 2021-08-10 ENCOUNTER — Other Ambulatory Visit: Payer: Self-pay | Admitting: Hematology & Oncology

## 2021-08-10 DIAGNOSIS — R922 Inconclusive mammogram: Secondary | ICD-10-CM

## 2021-08-10 DIAGNOSIS — R9230 Dense breasts, unspecified: Secondary | ICD-10-CM

## 2021-08-10 DIAGNOSIS — E1165 Type 2 diabetes mellitus with hyperglycemia: Secondary | ICD-10-CM

## 2021-08-24 ENCOUNTER — Ambulatory Visit
Admission: RE | Admit: 2021-08-24 | Discharge: 2021-08-24 | Disposition: A | Discharge status: Home / Self Care | Payer: Medicare HMO | Source: Ambulatory Visit | Attending: Hematology & Oncology | Admitting: Hematology & Oncology

## 2021-08-24 DIAGNOSIS — Z1239 Encounter for other screening for malignant neoplasm of breast: Secondary | ICD-10-CM | POA: Diagnosis not present

## 2021-08-24 DIAGNOSIS — R922 Inconclusive mammogram: Secondary | ICD-10-CM

## 2021-08-24 DIAGNOSIS — R9230 Dense breasts, unspecified: Secondary | ICD-10-CM

## 2021-08-24 IMAGING — MR MR BREAST BILAT WO/W CM
8 of 12 series · 33 of 48 positions shown · IV contrast (gadavist)
Comparison: [DATE] mammogram, [DATE] MR and prior studies

CLINICAL DATA: 75-year-old female for follow-up of superficial MR
enhancement of the LEFT breast on prior MRs. History of LEFT breast
cancer and lumpectomy in [PZ]. Excision of RIGHT breast complex
sclerosing lesion on [DATE].

LABS:  None performed today
EXAM:
BILATERAL BREAST MRI WITH AND WITHOUT CONTRAST
TECHNIQUE: Multiplanar, multisequence MR images of both breasts were obtained
prior to and following the intravenous administration of 7 ml of
Gadavist

[Series 2: t2_tirm_tra ipat (a-p) · axial · 3.0mm · 0.70mm/px · 1 of 55 slices shown]
[im 1/55]
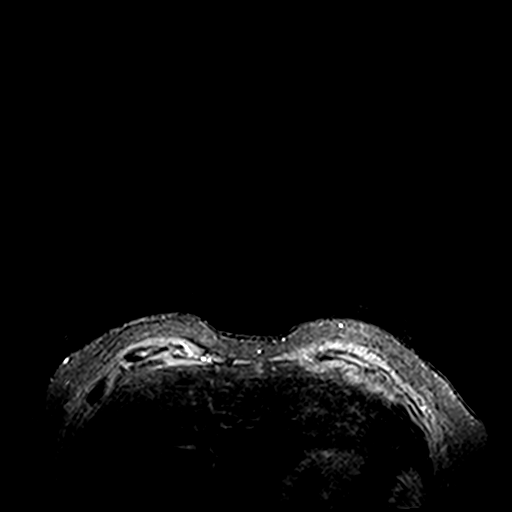

[Series 3: fl3d pre-cm no · axial · non-contrast · 1.2mm · 0.94mm/px · z∈[-64,+108]mm · 5 of 144 slices shown]
[im 1/144]
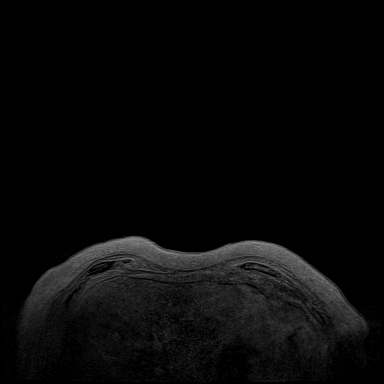
[im 36/144]
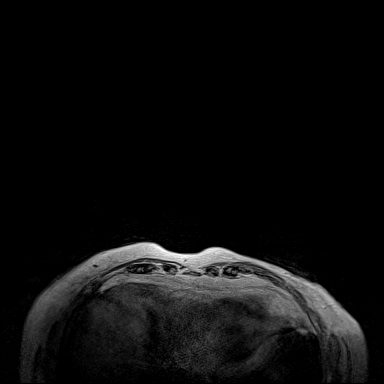
[im 72/144]
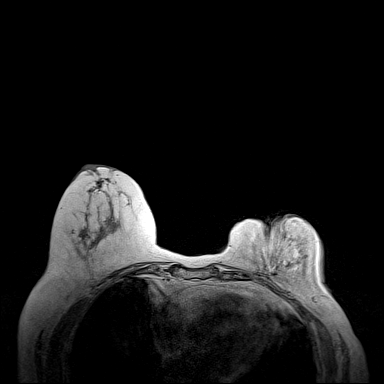
[im 108/144]
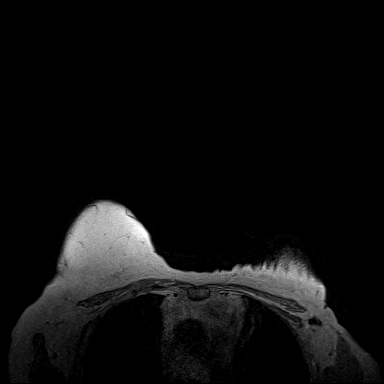
[im 144/144]
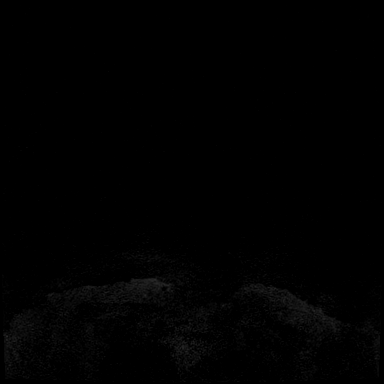

[Series 4: fl3d pre-cm · axial · non-contrast · 1.2mm · 0.94mm/px · z∈[-64,+108]mm · 5 of 144 slices shown]
[im 1/144]
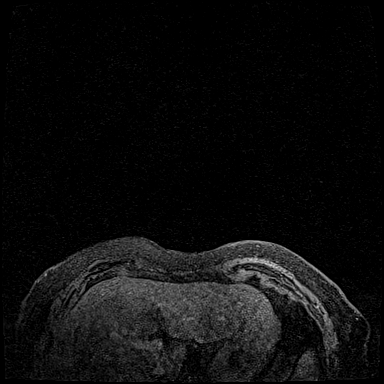
[im 36/144]
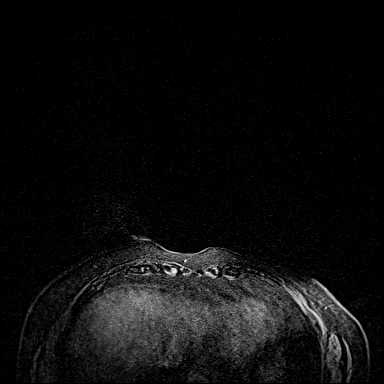
[im 72/144]
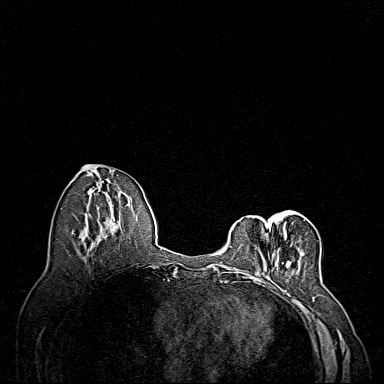
[im 108/144]
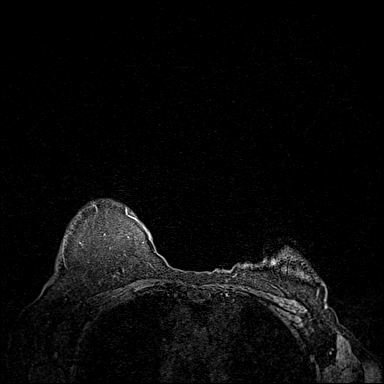
[im 144/144]
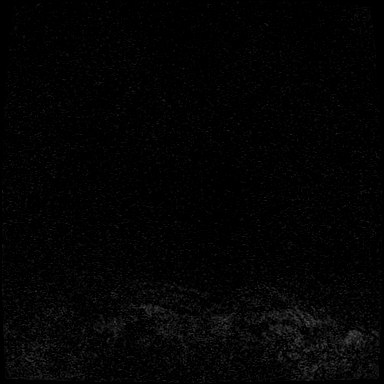

[Series 5: fl3d post immediate · axial · 1.2mm · 0.94mm/px · z∈[-64,+108]mm · 5 of 144 slices shown (1 of 3)]
[im 1/144]
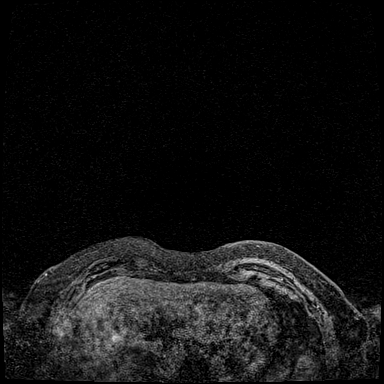
[im 36/144]
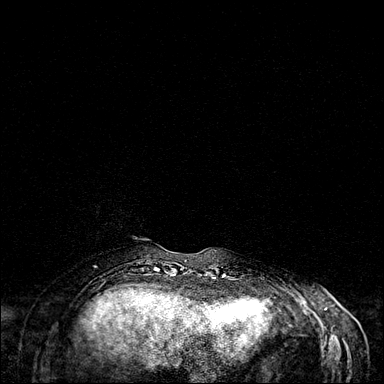
[im 72/144]
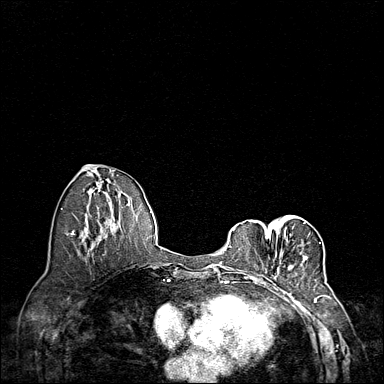
[im 108/144]
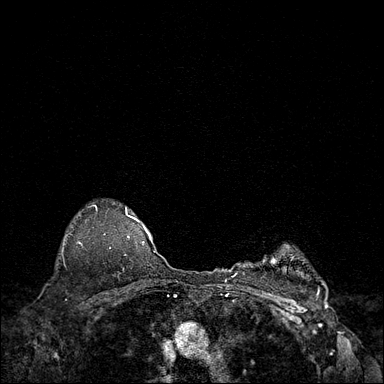
[im 144/144]
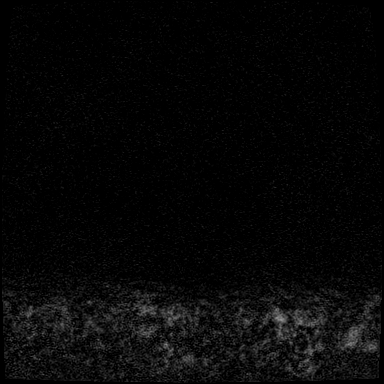

[Series 6: fl3d post immediate · axial · 1.2mm · 0.94mm/px · z∈[-64,+108]mm · 5 of 144 slices shown (2 of 3)]
[im 1/144]
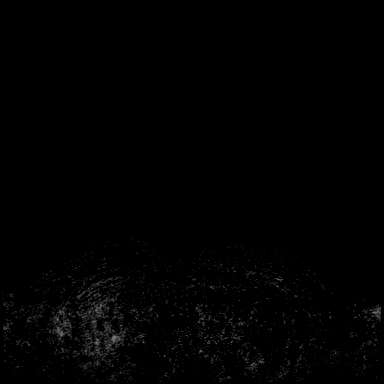
[im 36/144]
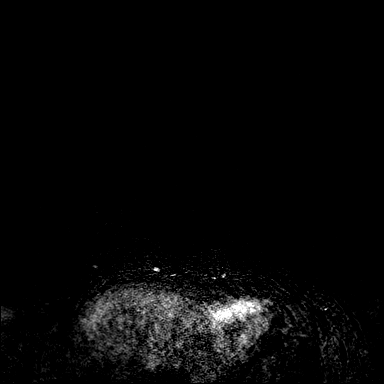
[im 72/144]
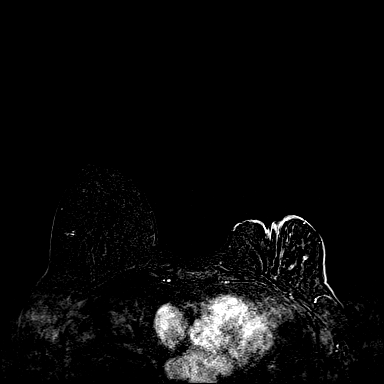
[im 108/144]
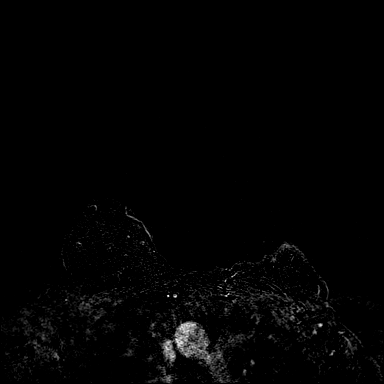
[im 144/144]
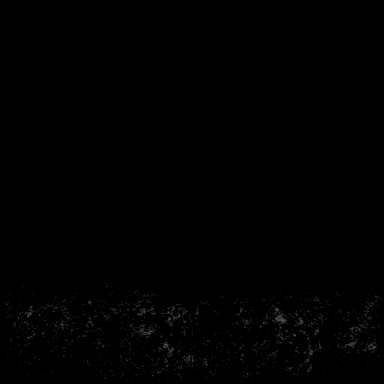

[Series 7: fl3d post immediate · axial · 172.8mm · 0.94mm/px · 1 of 1 slices shown (3 of 3)]
[im 1/1]
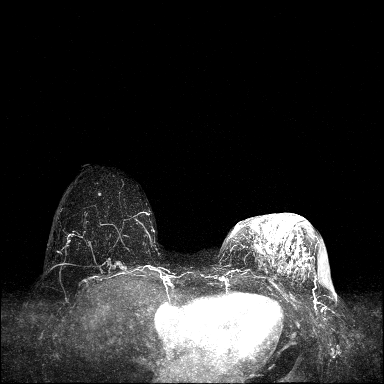

[Series 8: fl3d post 3min · axial · 1.2mm · 0.94mm/px · z∈[-64,+108]mm · 6 of 144 slices shown]
[im 1/144]
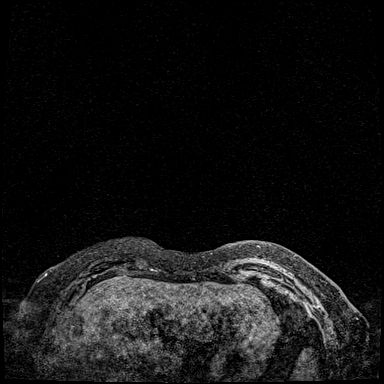
[im 29/144]
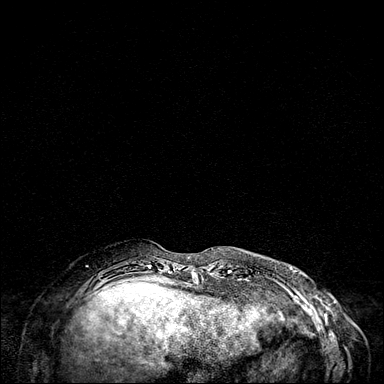
[im 58/144]
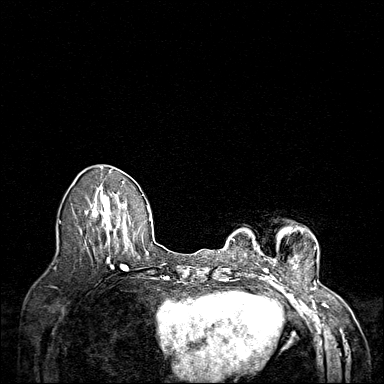
[im 86/144]
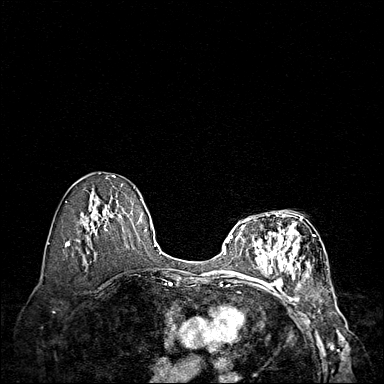
[im 115/144]
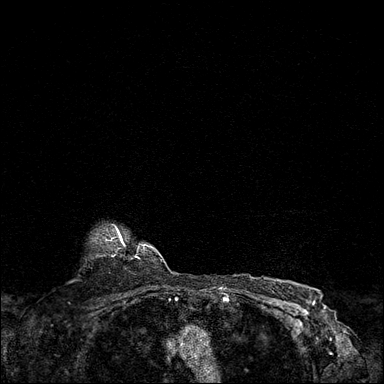
[im 144/144]
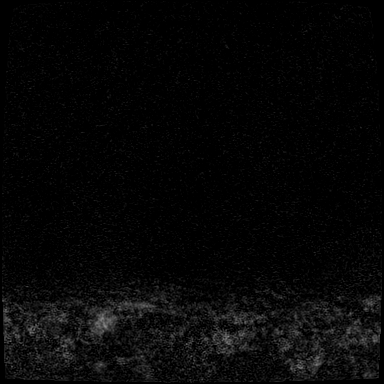

[Series 9: fl3d post 3min_sub · axial · 1.2mm · 0.94mm/px · z∈[-64,+73]mm · 5 of 144 slices shown]
[im 1/144]
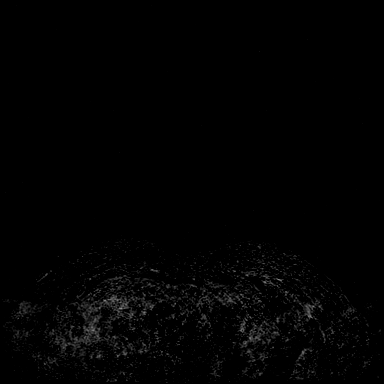
[im 29/144]
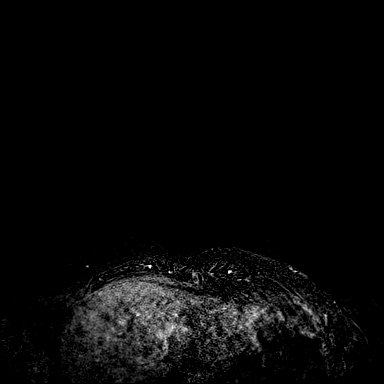
[im 58/144]
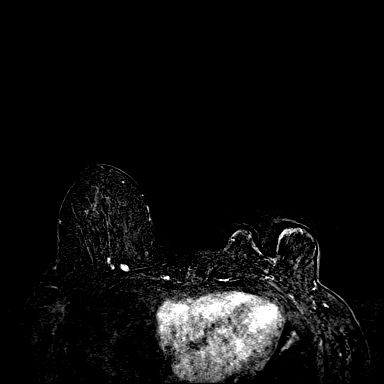
[im 86/144]
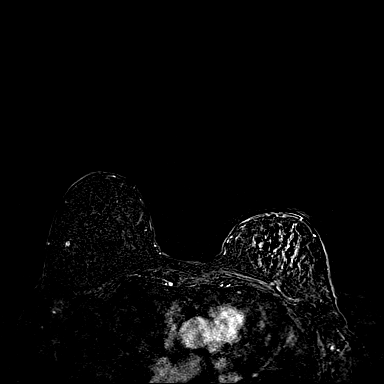
[im 115/144]
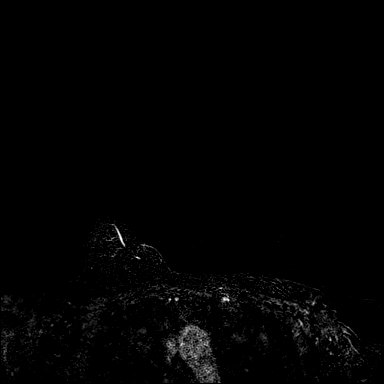

[33 of 48 positions shown; findings below may reference images not displayed]

Three-dimensional MR images were rendered by post-processing of the
original MR data on an independent workstation. The
three-dimensional MR images were interpreted, and findings are
reported in the following complete MRI report for this study. Three
dimensional images were evaluated at the independent interpreting
workstation using the DynaCAD thin client.
FINDINGS: Breast composition: c. Heterogeneous fibroglandular tissue.

Background parenchymal enhancement: RIGHT: Mild.  LEFT: Moderate

Right breast: Surgical changes noted without mass or suspicious
enhancement.

Left breast: LEFT lumpectomy changes are again noted. Previously
noted superficial masslike enhancement involving the skin at the
level of the nipple is less conspicuous when compared to [DATE]
and compatible with benign changes.

No new mass or suspicious enhancement identified.

Lymph nodes: No abnormal appearing lymph nodes.

Ancillary findings:  None.
IMPRESSION: 1. No MR evidence of breast malignancy.
2. Less conspicuous superficial/skin LEFT breast enhancement when
compared to [DATE] and compatible with benign etiology.
3. Surgical changes within both breast.

RECOMMENDATION:
Bilateral screening breast MRI in 1 year as clinically indicated.

Bilateral screening mammogram in 1 year.

BI-RADS CATEGORY  2: Benign.

## 2021-08-24 MED ORDER — GADOBENATE DIMEGLUMINE 529 MG/ML IV SOLN
7.0000 mL | Freq: Once | INTRAVENOUS | Status: AC | PRN
  Administered 2021-08-24: 7 mL via INTRAVENOUS

## 2021-08-27 ENCOUNTER — Encounter: Payer: Self-pay | Admitting: *Deleted

## 2021-09-24 ENCOUNTER — Encounter: Payer: Self-pay | Admitting: Family Medicine

## 2021-09-24 ENCOUNTER — Ambulatory Visit (INDEPENDENT_AMBULATORY_CARE_PROVIDER_SITE_OTHER): Payer: Medicare HMO | Admitting: Family Medicine

## 2021-09-24 VITALS — BP 136/70 | HR 104 | Temp 97.8°F | Resp 18 | Ht 64.0 in | Wt 154.6 lb

## 2021-09-24 DIAGNOSIS — E119 Type 2 diabetes mellitus without complications: Secondary | ICD-10-CM

## 2021-09-24 DIAGNOSIS — E1165 Type 2 diabetes mellitus with hyperglycemia: Secondary | ICD-10-CM | POA: Diagnosis not present

## 2021-09-24 DIAGNOSIS — I10 Essential (primary) hypertension: Secondary | ICD-10-CM

## 2021-09-24 DIAGNOSIS — E785 Hyperlipidemia, unspecified: Secondary | ICD-10-CM

## 2021-09-24 DIAGNOSIS — E1169 Type 2 diabetes mellitus with other specified complication: Secondary | ICD-10-CM | POA: Diagnosis not present

## 2021-09-24 MED ORDER — TRUE METRIX BLOOD GLUCOSE TEST VI STRP: ORAL_STRIP | 12 refills | Status: DC

## 2021-09-24 NOTE — Assessment & Plan Note (Signed)
Well controlled, no changes to meds. Encouraged heart healthy diet such as the DASH diet and exercise as tolerated.  °

## 2021-09-24 NOTE — Assessment & Plan Note (Signed)
Encourage heart healthy diet such as MIND or DASH diet, increase exercise, avoid trans fats, simple carbohydrates and processed foods, consider a krill or fish or flaxseed oil cap daily.  °

## 2021-09-24 NOTE — Progress Notes (Signed)
Established Patient Office Visit  Subjective:  Patient ID: Michelle Long, female    DOB: Jun 27, 1946  Age: 75 y.o. MRN: [id number]  CC:  Chief Complaint  Patient presents with   Hypertension   Hyperlipidemia   Diabetes   Follow-up    HPI [name] presents for f/u dm, chol and bp.  HYPERTENSION  Blood pressure range-not checking   Chest pain- no      Dyspnea- no Lightheadedness- no   Edema- no Other side effects - no   Medication compliance: good Low salt diet- yes  DIABETES  Blood Sugar ranges-112 has been the highest   Polyuria- no New Visual problems- no Hypoglycemic symptoms- no Other side effects-no Medication compliance - n Last eye exam- going this month Foot exam- today  HYPERLIPIDEMIA  Medication compliance- good RUQ pain- no  Muscle aches- no Other side effects-no    Past Medical History:  Diagnosis Date   Anxiety    Asthma    Bipolar disorder (HCC)    Bladder cancer ([place])    Breast cancer ([place])    Cancer of lower-inner quadrant of left female breast ([place]) 12/08/2013   COPD (chronic obstructive pulmonary disease) ([place])    former smoker, quit 1999   Depression    Diabetes mellitus    GERD (gastroesophageal reflux disease)    Hepatitis B infection    History of transfusion of whole blood    Approximately 2009, due to breast cancer/chemo.   Hyperlipidemia    Hypertension    Personal history of chemotherapy    Personal history of radiation therapy    PONV (postoperative nausea and vomiting)     Past Surgical History:  Procedure Laterality Date   ABDOMINAL HYSTERECTOMY     APPENDECTOMY  1961   bladder cancer  2006   BREAST LUMPECTOMY Left 2007   BREAST LUMPECTOMY WITH RADIOACTIVE SEED LOCALIZATION Right 03/09/2021   Procedure: RIGHT BREAST LUMPECTOMY WITH RADIOACTIVE SEED LOCALIZATION;  Surgeon: [name], MD;  Location: [facility];  Service: General;  Laterality: Right;   [place]    Family History  Problem Relation Age of Onset   Arthritis Mother    Breast cancer Mother    Transient ischemic attack Mother    Hypertension Mother    Heart disease Father    Diabetes Father    Cancer Father        bladder cancer   Throat cancer Father    Breast cancer Sister     Social History   Socioeconomic History   Marital status: Divorced    Spouse name: Not on file   Number of children: Not on file   Years of education: Not on file   Highest education level: Not on file  Occupational History   Occupation: retired    Comment: retired  Tobacco Use   Smoking status: Former    Packs/day: 1.50    Years: 33.00    Pack years: 49.50    Types: Cigarettes    Start date: 12/08/1964    Quit date: 08/21/1998    Years since quitting: 23.1   Smokeless tobacco: Never   Tobacco comments:    quit 16 years ago  Vaping Use   Vaping Use: Never used  Substance and Sexual Activity   Alcohol use: Not Currently    Alcohol/week: 0.0 standard drinks   Drug use: No   Sexual activity: Not Currently  Partners: Male    Birth control/protection: Surgical  Other Topics Concern   Not on file  Social History Narrative   Exercise--- walks with neighbor   Social Determinants of Health   Financial Resource Strain: Low Risk    Difficulty of Paying Living Expenses: Not hard at all  Food Insecurity: No Food Insecurity   Worried About Charity fundraiser in the Last Year: Never true   Arboriculturist in the Last Year: Never true  Transportation Needs: No Transportation Needs   Lack of Transportation (Medical): No   Lack of Transportation (Non-Medical): No  Physical Activity: Sufficiently Active   Days of Exercise per Week: 3 days   Minutes of Exercise per Session: 60 min  Stress: No Stress Concern Present   Feeling of Stress : Not at all  Social Connections: Moderately Integrated   Frequency of Communication with Friends and Family: More than three times a week   Frequency  of Social Gatherings with Friends and Family: More than three times a week   Attends Religious Services: More than 4 times per year   Active Member of [employer] or Organizations: Yes   Attends Music therapist: More than 4 times per year   Marital Status: Divorced  Human resources officer Violence: Not At Risk   Fear of Current or Ex-Partner: No   Emotionally Abused: No   Physically Abused: No   Sexually Abused: No    Outpatient Medications Prior to Visit  Medication Sig Dispense Refill   albuterol (VENTOLIN HFA) 108 (90 Base) MCG/ACT inhaler INHALE TWO PUFFS INTO LUNGS EVERY 6 HOURS AS NEEDED FOR WHEEZING OR SHORTNESS OF BREATH 18 g 5   Alcohol Swabs (DROPSAFE ALCOHOL PREP) 70 % PADS APPLY  IN THE MORNING AND AT BEDTIME. 100 each 0   alendronate (FOSAMAX) 70 MG tablet TAKE 1 TABLET BY MOUTH ONCE A WEEK WITH A FULL GLASS OF WATER ON AN EMPTY STOMACH 12 tablet 3   aspirin 81 MG tablet Take 81 mg by mouth daily.     b complex vitamins tablet Take 1 tablet by mouth daily.     Blood Glucose Calibration (TRUE METRIX LEVEL 1) Low SOLN 1 each by In Vitro route as directed. E11.65 1 each EACH   Blood Glucose Monitoring Suppl (TRUE METRIX METER) w/Device KIT Check sugars once daily as directed. E11.9 1 kit 0   budesonide-formoterol (SYMBICORT) 160-4.5 MCG/ACT inhaler Inhale 2 puffs into the lungs in the morning and at bedtime. 30.6 g 1   Calcium Carb-Cholecalciferol (CALCIUM 1000 + D PO) Take by mouth 2 (two) times daily.     Cholecalciferol (VITAMIN D) 2000 units CAPS Take 2,000 Units by mouth daily.     citalopram (CELEXA) 10 MG tablet Take 0.5 tablets (5 mg total) by mouth at bedtime. 45 tablet 1   ezetimibe (ZETIA) 10 MG tablet Take 1 tablet (10 mg total) by mouth daily. 90 tablet 1   letrozole (FEMARA) 2.5 MG tablet Take 1 tablet (2.5 mg total) by mouth daily. 90 tablet 3   LORazepam (ATIVAN) 0.5 MG tablet Take 1 tablet (0.5 mg total) by mouth 2 (two) times daily as needed. 90 tablet 0    losartan (COZAAR) 50 MG tablet TAKE 1 TABLET EVERY DAY 90 tablet 1   metFORMIN (GLUCOPHAGE) 1000 MG tablet Take 1 tablet (1,000 mg total) by mouth 2 (two) times daily with a meal. 180 tablet 1   Multiple Vitamin (MULTIVITAMIN) tablet Take 1 tablet by mouth  daily.     Multiple Vitamins-Minerals (HAIR SKIN AND NAILS FORMULA) TABS Take 1 tablet by mouth daily. 2,500 mg daily     nateglinide (STARLIX) 60 MG tablet TAKE 1 TABLET THREE TIMES DAILY WITH MEALS 270 tablet 1   OLANZapine (ZYPREXA) 5 MG tablet 1/2 tablet daily (Patient taking differently: 5 mg daily.) 45 tablet 1   pantoprazole (PROTONIX) 40 MG tablet TAKE 1 TABLET EVERY DAY 90 tablet 1   rosuvastatin (CRESTOR) 5 MG tablet TAKE 1 TABLET THREE TIMES WEEKLY 39 tablet 2   Spacer/Aero Chamber Mouthpiece MISC To use with inhaler 1 each 1   TRUEplus Lancets 33G MISC TEST BLOOD SUGAR EVERY DAY 100 each 1   glucose blood (TRUE METRIX BLOOD GLUCOSE TEST) test strip TEST BLOOD SUGAR EVERY DAY 100 strip 12   No facility-administered medications prior to visit.    Allergies  Allergen Reactions   Fluoxetine Other (See Comments)    seizure   Lamotrigine Other (See Comments) and Itching    itch itch   Sulfa Antibiotics Other (See Comments) and Shortness Of Breath    Dyspnea   Sulfa Antibiotics    Statins Other (See Comments)    Muscle pains Muscle pains Muscle pains Muscle pain   Fluoxetine Hcl Other (See Comments)    seizure   Sulfa Drugs Cross Reactors     Dyspnea     ROS Review of Systems  Constitutional:  Negative for appetite change, diaphoresis, fatigue and unexpected weight change.  Eyes:  Negative for pain, redness and visual disturbance.  Respiratory:  Negative for cough, chest tightness, shortness of breath and wheezing.   Cardiovascular:  Negative for chest pain, palpitations and leg swelling.  Endocrine: Negative for cold intolerance, heat intolerance, polydipsia, polyphagia and polyuria.  Genitourinary:  Negative for  difficulty urinating, dysuria and frequency.  Neurological:  Negative for dizziness, light-headedness, numbness and headaches.     Objective:    Physical Exam Vitals and nursing note reviewed.  Constitutional:      Appearance: She is well-developed.  HENT:     Head: Normocephalic and atraumatic.  Eyes:     Conjunctiva/sclera: Conjunctivae normal.  Neck:     Thyroid: No thyromegaly.     Vascular: No carotid bruit or JVD.  Cardiovascular:     Rate and Rhythm: Normal rate and regular rhythm.     Heart sounds: Normal heart sounds. No murmur heard. Pulmonary:     Effort: Pulmonary effort is normal. No respiratory distress.     Breath sounds: Normal breath sounds. No wheezing or rales.  Chest:     Chest wall: No tenderness.  Musculoskeletal:     Cervical back: Normal range of motion and neck supple.  Neurological:     Mental Status: She is alert and oriented to person, place, and time.  Psychiatric:        Mood and Affect: Mood normal.        Behavior: Behavior normal.        Thought Content: Thought content normal.        Judgment: Judgment normal.   Diabetic Foot Exam - Simple   Simple Foot Form Diabetic Foot exam was performed with the following findings: Yes 09/24/2021 12:30 PM  Visual Inspection No deformities, no ulcerations, no other skin breakdown bilaterally: Yes Sensation Testing Intact to touch and monofilament testing bilaterally: Yes Pulse Check Posterior Tibialis and Dorsalis pulse intact bilaterally: Yes Comments      BP 136/70 (BP Location: Right Arm, Patient  Position: Sitting, Cuff Size: Normal)   Pulse (!) 104   Temp 97.8 F (36.6 C) (Oral)   Resp 18   Ht '5\' 4"'  (1.626 m)   Wt 154 lb 9.6 oz (70.1 kg)   SpO2 97%   BMI 26.54 kg/m  Wt Readings from Last 3 Encounters:  09/24/21 154 lb 9.6 oz (70.1 kg)  06/29/21 152 lb (68.9 kg)  04/26/21 153 lb (69.4 kg)     Health Maintenance Due  Topic Date Due   Zoster Vaccines- Shingrix (2 of 2) 09/19/2019    INFLUENZA VACCINE  05/21/2021   COVID-19 Vaccine (4 - Booster) 09/18/2021   OPHTHALMOLOGY EXAM  09/19/2021    There are no preventive care reminders to display for this patient.  Lab Results  Component Value Date   TSH 0.80 01/30/2015   Lab Results  Component Value Date   WBC 6.8 06/29/2021   HGB 14.3 06/29/2021   HCT 44.2 06/29/2021   MCV 91.9 06/29/2021   PLT 199 06/29/2021   Lab Results  Component Value Date   NA 141 06/29/2021   K 4.6 06/29/2021   CHLORIDE 103 01/01/2017   CO2 29 06/29/2021   GLUCOSE 109 (H) 06/29/2021   BUN 13 06/29/2021   CREATININE 0.83 06/29/2021   BILITOT 0.6 06/29/2021   ALKPHOS 37 (L) 06/29/2021   AST 41 06/29/2021   ALT 50 (H) 06/29/2021   PROT 7.1 06/29/2021   ALBUMIN 4.7 06/29/2021   CALCIUM 10.3 06/29/2021   ANIONGAP 10 06/29/2021   EGFR 70 (L) 01/01/2017   GFR 73.27 06/26/2021   Lab Results  Component Value Date   CHOL 143 06/26/2021   Lab Results  Component Value Date   HDL 48.90 06/26/2021   Lab Results  Component Value Date   LDLCALC 72 06/26/2021   Lab Results  Component Value Date   TRIG 110.0 06/26/2021   Lab Results  Component Value Date   CHOLHDL 3 06/26/2021   Lab Results  Component Value Date   HGBA1C 6.7 (H) 06/26/2021      Assessment & Plan:   Problem List Items Addressed This Visit       Unprioritized   Essential hypertension    Well controlled, no changes to meds. Encouraged heart healthy diet such as the DASH diet and exercise as tolerated.       Hyperlipidemia associated with type 2 diabetes mellitus ([place])    Encourage heart healthy diet such as MIND or DASH diet, increase exercise, avoid trans fats, simple carbohydrates and processed foods, consider a krill or fish or flaxseed oil cap daily.       Relevant Orders   CBC with Differential/Platelet   Comprehensive metabolic panel   Hemoglobin A1c   Lipid panel   Uncontrolled type 2 diabetes mellitus with hyperglycemia (HCC)    hgba1c  to be checked , minimize simple carbs. Increase exercise as tolerated. Continue current meds       Other Visit Diagnoses     Diabetes mellitus type II, non insulin dependent ([place])    -  Primary   Relevant Medications   glucose blood (TRUE METRIX BLOOD GLUCOSE TEST) test strip   Other Relevant Orders   CBC with Differential/Platelet   Comprehensive metabolic panel   Hemoglobin A1c   Lipid panel   Primary hypertension       Relevant Orders   CBC with Differential/Platelet   Comprehensive metabolic panel   Hemoglobin A1c   Lipid panel  Meds ordered this encounter  Medications   glucose blood (TRUE METRIX BLOOD GLUCOSE TEST) test strip    Sig: TEST BLOOD SUGAR EVERY DAY    Dispense:  100 strip    Refill:  12    Dx: E11.9    Follow-up: Return in about 6 months (around 03/25/2022), or if symptoms worsen or fail to improve, for annual exam, fasting--- needs lab app this week.    Ann Held, DO

## 2021-09-24 NOTE — Patient Instructions (Signed)

## 2021-09-24 NOTE — Assessment & Plan Note (Signed)
hgba1c to be checked, minimize simple carbs. Increase exercise as tolerated. Continue current meds  

## 2021-09-26 ENCOUNTER — Other Ambulatory Visit (INDEPENDENT_AMBULATORY_CARE_PROVIDER_SITE_OTHER): Payer: Medicare HMO

## 2021-09-26 DIAGNOSIS — I10 Essential (primary) hypertension: Secondary | ICD-10-CM

## 2021-09-26 DIAGNOSIS — E785 Hyperlipidemia, unspecified: Secondary | ICD-10-CM | POA: Diagnosis not present

## 2021-09-26 DIAGNOSIS — E1169 Type 2 diabetes mellitus with other specified complication: Secondary | ICD-10-CM

## 2021-09-26 DIAGNOSIS — E119 Type 2 diabetes mellitus without complications: Secondary | ICD-10-CM | POA: Diagnosis not present

## 2021-09-26 LAB — CBC WITH DIFFERENTIAL/PLATELET
Basophils Absolute: 0 10*3/uL (ref 0.0–0.1)
Basophils Relative: 0.5 % (ref 0.0–3.0)
Eosinophils Absolute: 0.1 10*3/uL (ref 0.0–0.7)
Eosinophils Relative: 1.4 % (ref 0.0–5.0)
HCT: 42.4 % (ref 36.0–46.0)
Hemoglobin: 13.9 g/dL (ref 12.0–15.0)
Lymphocytes Relative: 20.5 % (ref 12.0–46.0)
Lymphs Abs: 1.7 10*3/uL (ref 0.7–4.0)
MCHC: 32.8 g/dL (ref 30.0–36.0)
MCV: 90.9 fl (ref 78.0–100.0)
Monocytes Absolute: 0.7 10*3/uL (ref 0.1–1.0)
Monocytes Relative: 8.4 % (ref 3.0–12.0)
Neutro Abs: 5.7 10*3/uL (ref 1.4–7.7)
Neutrophils Relative %: 69.2 % (ref 43.0–77.0)
Platelets: 195 10*3/uL (ref 150.0–400.0)
RBC: 4.66 Mil/uL (ref 3.87–5.11)
RDW: 14.3 % (ref 11.5–15.5)
WBC: 8.2 10*3/uL (ref 4.0–10.5)

## 2021-09-26 LAB — LIPID PANEL
Cholesterol: 148 mg/dL (ref 0–200)
HDL: 49.8 mg/dL (ref >39.00)
LDL Cholesterol: 70 mg/dL (ref 0–99)
NonHDL: 98.67
Total CHOL/HDL Ratio: 3
Triglycerides: 142 mg/dL (ref 0.0–149.0)
VLDL: 28.4 mg/dL (ref 0.0–40.0)

## 2021-09-26 LAB — COMPREHENSIVE METABOLIC PANEL
ALT: 35 U/L (ref 0–35)
AST: 30 U/L (ref 0–37)
Albumin: 4.2 g/dL (ref 3.5–5.2)
Alkaline Phosphatase: 38 U/L — ABNORMAL LOW (ref 39–117)
BUN: 11 mg/dL (ref 6–23)
CO2: 30 mEq/L (ref 19–32)
Calcium: 9.6 mg/dL (ref 8.4–10.5)
Chloride: 104 mEq/L (ref 96–112)
Creatinine, Ser: 0.77 mg/dL (ref 0.40–1.20)
GFR: 75.43 mL/min (ref >60.00)
Glucose, Bld: 116 mg/dL — ABNORMAL HIGH (ref 70–99)
Potassium: 4.5 mEq/L (ref 3.5–5.1)
Sodium: 141 mEq/L (ref 135–145)
Total Bilirubin: 0.5 mg/dL (ref 0.2–1.2)
Total Protein: 6.6 g/dL (ref 6.0–8.3)

## 2021-09-26 LAB — HEMOGLOBIN A1C: Hgb A1c MFr Bld: 6.6 % — ABNORMAL HIGH (ref 4.6–6.5)

## 2021-09-27 ENCOUNTER — Inpatient Hospital Stay: Payer: Medicare HMO | Attending: Family Medicine

## 2021-09-27 ENCOUNTER — Other Ambulatory Visit: Payer: Self-pay | Admitting: Family Medicine

## 2021-09-27 ENCOUNTER — Other Ambulatory Visit: Payer: Self-pay

## 2021-09-27 ENCOUNTER — Inpatient Hospital Stay (HOSPITAL_BASED_OUTPATIENT_CLINIC_OR_DEPARTMENT_OTHER): Payer: Medicare HMO | Admitting: Hematology & Oncology

## 2021-09-27 VITALS — BP 139/59 | HR 110 | Temp 98.4°F | Resp 17 | Wt 155.0 lb

## 2021-09-27 DIAGNOSIS — C50912 Malignant neoplasm of unspecified site of left female breast: Secondary | ICD-10-CM | POA: Insufficient documentation

## 2021-09-27 DIAGNOSIS — C50312 Malignant neoplasm of lower-inner quadrant of left female breast: Secondary | ICD-10-CM

## 2021-09-27 DIAGNOSIS — R7309 Other abnormal glucose: Secondary | ICD-10-CM | POA: Diagnosis not present

## 2021-09-27 DIAGNOSIS — N62 Hypertrophy of breast: Secondary | ICD-10-CM | POA: Insufficient documentation

## 2021-09-27 LAB — HEMOGLOBIN A1C
Hgb A1c MFr Bld: 6.5 % — ABNORMAL HIGH (ref 4.8–5.6)
Mean Plasma Glucose: 139.85 mg/dL

## 2021-09-27 LAB — CBC WITH DIFFERENTIAL (CANCER CENTER ONLY)
Abs Immature Granulocytes: 0.03 10*3/uL (ref 0.00–0.07)
Basophils Absolute: 0 10*3/uL (ref 0.0–0.1)
Basophils Relative: 0 %
Eosinophils Absolute: 0.1 10*3/uL (ref 0.0–0.5)
Eosinophils Relative: 1 %
HCT: 41.1 % (ref 36.0–46.0)
Hemoglobin: 13.3 g/dL (ref 12.0–15.0)
Immature Granulocytes: 0 %
Lymphocytes Relative: 18 %
Lymphs Abs: 1.6 10*3/uL (ref 0.7–4.0)
MCH: 29.8 pg (ref 26.0–34.0)
MCHC: 32.4 g/dL (ref 30.0–36.0)
MCV: 91.9 fL (ref 80.0–100.0)
Monocytes Absolute: 0.8 10*3/uL (ref 0.1–1.0)
Monocytes Relative: 9 %
Neutro Abs: 6.2 10*3/uL (ref 1.7–7.7)
Neutrophils Relative %: 72 %
Platelet Count: 182 10*3/uL (ref 150–400)
RBC: 4.47 MIL/uL (ref 3.87–5.11)
RDW: 13.2 % (ref 11.5–15.5)
WBC Count: 8.7 10*3/uL (ref 4.0–10.5)
nRBC: 0 % (ref 0.0–0.2)

## 2021-09-27 LAB — LACTATE DEHYDROGENASE: LDH: 147 U/L (ref 98–192)

## 2021-09-27 LAB — CMP (CANCER CENTER ONLY)
ALT: 30 U/L (ref 0–44)
AST: 23 U/L (ref 15–41)
Albumin: 4.2 g/dL (ref 3.5–5.0)
Alkaline Phosphatase: 41 U/L (ref 38–126)
Anion gap: 8 (ref 5–15)
BUN: 13 mg/dL (ref 8–23)
CO2: 30 mmol/L (ref 22–32)
Calcium: 10.1 mg/dL (ref 8.9–10.3)
Chloride: 102 mmol/L (ref 98–111)
Creatinine: 0.75 mg/dL (ref 0.44–1.00)
GFR, Estimated: >60 mL/min (ref >60)
Glucose, Bld: 202 mg/dL — ABNORMAL HIGH (ref 70–99)
Potassium: 4.3 mmol/L (ref 3.5–5.1)
Sodium: 140 mmol/L (ref 135–145)
Total Bilirubin: 0.3 mg/dL (ref 0.3–1.2)
Total Protein: 6.6 g/dL (ref 6.5–8.1)

## 2021-09-27 NOTE — Progress Notes (Signed)
Hematology and Oncology Follow Up Visit  Michelle Long [id number] [birth year] 75 y.o. 09/27/2021   Principle Diagnosis:  Stage IIA (T2 N0M0) infiltrating ductal carcinoma of the left breast Usual Ductal Hyperplasia -- Complex sclerosing    Current Therapy:   Tamoxifen 20 mg by mouth daily - finished in September 2017 Femara 2.5 mg p.o. daily --started on 04/02/2021      Interim History:  Michelle Long is here for follow-up.  Michelle Long is doing pretty well.  We last saw her back in September.  Michelle Long is doing well on the Femara.  Her 1 problem is the blood sugars.  Her blood sugar today is 202.  Michelle Long is on metformin and Starlix.  Michelle Long is being managed by Dr. Carollee Herter.  Michelle Long has had no issues with nausea or vomiting.  Has had no issues with fever.  Michelle Long has had no problems with COVID.  Michelle Long had a nice Thanksgiving.  Michelle Long was at some in-laws house.  Michelle Long has had no headache.  Michelle Long does not have any issues with leg swelling.  There is no neuropathy.  Her liver function studies have been normal.  Thankfully Michelle Long never needed a liver biopsy.  Overall, I would say performance status is probably ECOG 1.    Medications:  Allergies as of 09/27/2021       Reactions   Fluoxetine Other (See Comments)   seizure   Lamotrigine Other (See Comments), Itching   itch itch   Sulfa Antibiotics Other (See Comments), Shortness Of Breath   Dyspnea   Sulfa Antibiotics    Statins Other (See Comments)   Muscle pains Muscle pains Muscle pains Muscle pain   Fluoxetine Hcl Other (See Comments)   seizure   Sulfa Drugs Cross Reactors    Dyspnea        Medication List        Accurate as of September 27, 2021  2:10 PM. If you have any questions, ask your nurse or doctor.          albuterol 108 (90 Base) MCG/ACT inhaler Commonly known as: Ventolin HFA INHALE TWO PUFFS INTO LUNGS EVERY 6 HOURS AS NEEDED FOR WHEEZING OR SHORTNESS OF BREATH   alendronate 70 MG tablet Commonly known as: FOSAMAX TAKE 1  TABLET BY MOUTH ONCE A WEEK WITH A FULL GLASS OF WATER ON AN EMPTY STOMACH   aspirin 81 MG tablet Take 81 mg by mouth daily.   b complex vitamins tablet Take 1 tablet by mouth daily.   budesonide-formoterol 160-4.5 MCG/ACT inhaler Commonly known as: Symbicort Inhale 2 puffs into the lungs in the morning and at bedtime.   CALCIUM 1000 + D PO Take by mouth 2 (two) times daily.   citalopram 10 MG tablet Commonly known as: CELEXA Take 0.5 tablets (5 mg total) by mouth at bedtime.   DropSafe Alcohol Prep 70 % Pads APPLY  IN THE MORNING AND AT BEDTIME.   ezetimibe 10 MG tablet Commonly known as: ZETIA TAKE 1 TABLET EVERY DAY   Hair Skin and Nails Formula Tabs Take 1 tablet by mouth daily. 2,500 mg daily   letrozole 2.5 MG tablet Commonly known as: FEMARA Take 1 tablet (2.5 mg total) by mouth daily.   LORazepam 0.5 MG tablet Commonly known as: ATIVAN Take 1 tablet (0.5 mg total) by mouth 2 (two) times daily as needed.   losartan 50 MG tablet Commonly known as: COZAAR TAKE 1 TABLET EVERY DAY   metFORMIN 1000 MG tablet Commonly known  as: GLUCOPHAGE Take 1 tablet (1,000 mg total) by mouth 2 (two) times daily with a meal.   multivitamin tablet Take 1 tablet by mouth daily.   nateglinide 60 MG tablet Commonly known as: STARLIX TAKE 1 TABLET THREE TIMES DAILY WITH MEALS   OLANZapine 5 MG tablet Commonly known as: ZYPREXA 1/2 tablet daily What changed:  how much to take when to take this additional instructions   pantoprazole 40 MG tablet Commonly known as: PROTONIX TAKE 1 TABLET EVERY DAY   rosuvastatin 5 MG tablet Commonly known as: CRESTOR TAKE 1 TABLET THREE TIMES WEEKLY   Spacer/Aero Chamber Mouthpiece Misc To use with inhaler   True Metrix Blood Glucose Test test strip Generic drug: glucose blood TEST BLOOD SUGAR EVERY DAY   True Metrix Level 1 Low Soln 1 each by In Vitro route as directed. E11.65   True Metrix Meter w/Device Kit Check sugars  once daily as directed. E11.9   TRUEplus Lancets 33G Misc TEST BLOOD SUGAR EVERY DAY   Vitamin D 50 MCG (2000 UT) Caps Take 2,000 Units by mouth daily.        Allergies:  Allergies  Allergen Reactions   Fluoxetine Other (See Comments)    seizure   Lamotrigine Other (See Comments) and Itching    itch itch   Sulfa Antibiotics Other (See Comments) and Shortness Of Breath    Dyspnea   Sulfa Antibiotics    Statins Other (See Comments)    Muscle pains Muscle pains Muscle pains Muscle pain   Fluoxetine Hcl Other (See Comments)    seizure   Sulfa Drugs Cross Reactors     Dyspnea     Past Medical History, Surgical history, Social history, and Family History were reviewed and updated.  Review of Systems: Review of Systems  Constitutional: Negative.   HENT: Negative.    Eyes: Negative.   Respiratory: Negative.    Cardiovascular: Negative.   Gastrointestinal: Negative.   Genitourinary: Negative.   Musculoskeletal: Negative.   Skin: Negative.   Neurological: Negative.   Endo/Heme/Allergies: Negative.   Psychiatric/Behavioral: Negative.      Physical Exam:  vitals were not taken for this visit.   Wt Readings from Last 3 Encounters:  09/24/21 154 lb 9.6 oz (70.1 kg)  06/29/21 152 lb (68.9 kg)  04/26/21 153 lb (69.4 kg)    Physical Exam Vitals reviewed.  Constitutional:      Comments: Her breast exam shows her right breast with no masses, edema or erythema. There is no right axillary adenopathy. Left breast is somewhat contracted from surgery and radiation. Michelle Long has a well-healed lumpectomy scar at the 6:00 position. There is some slight firmness at the lumpectomy site. There is no distinct mass. There is no left axillary adenopathy.  HENT:     Head: Normocephalic and atraumatic.  Eyes:     Pupils: Pupils are equal, round, and reactive to light.  Cardiovascular:     Rate and Rhythm: Normal rate and regular rhythm.     Heart sounds: Normal heart sounds.   Pulmonary:     Effort: Pulmonary effort is normal.     Breath sounds: Normal breath sounds.  Abdominal:     General: Bowel sounds are normal.     Palpations: Abdomen is soft.  Musculoskeletal:        General: No tenderness or deformity. Normal range of motion.     Cervical back: Normal range of motion.  Lymphadenopathy:     Cervical: No cervical adenopathy.  Skin:    General: Skin is warm and dry.     Findings: No erythema or rash.  Neurological:     Mental Status: Michelle Long is alert and oriented to person, place, and time.  Psychiatric:        Behavior: Behavior normal.        Thought Content: Thought content normal.        Judgment: Judgment normal.   athy.   Lab Results  Component Value Date   WBC 8.7 09/27/2021   HGB 13.3 09/27/2021   HCT 41.1 09/27/2021   MCV 91.9 09/27/2021   PLT 182 09/27/2021   No results found for: FERRITIN, IRON, TIBC, UIBC, IRONPCTSAT Lab Results  Component Value Date   RBC 4.47 09/27/2021   No results found for: KPAFRELGTCHN, LAMBDASER, KAPLAMBRATIO No results found for: IGGSERUM, IGA, IGMSERUM No results found for: Odetta Pink, SPEI   Chemistry      Component Value Date/Time   NA 140 09/27/2021 1327   NA 139 07/02/2017 1445   NA 142 01/01/2017 1059   K 4.3 09/27/2021 1327   K 4.6 07/02/2017 1445   K 4.6 01/01/2017 1059   CL 102 09/27/2021 1327   CL 103 07/02/2017 1445   CL 105 01/20/2015 1240   CO2 30 09/27/2021 1327   CO2 28 07/02/2017 1445   CO2 26 01/01/2017 1059   BUN 13 09/27/2021 1327   BUN 10 07/02/2017 1445   BUN 10.6 01/01/2017 1059   CREATININE 0.75 09/27/2021 1327   CREATININE 0.68 07/17/2020 1105   CREATININE 0.8 01/01/2017 1059      Component Value Date/Time   CALCIUM 10.1 09/27/2021 1327   CALCIUM 10.3 07/02/2017 1445   CALCIUM 10.1 01/01/2017 1059   ALKPHOS 41 09/27/2021 1327   ALKPHOS 47 07/02/2017 1445   ALKPHOS 49 01/01/2017 1059   AST 23 09/27/2021  1327   AST 60 (H) 01/01/2017 1059   ALT 30 09/27/2021 1327   ALT 63 (H) 01/01/2017 1059   BILITOT 0.3 09/27/2021 1327   BILITOT 0.64 01/01/2017 1059     Impression and Plan: Michelle Long is a most delightful 75 year old postmenopausal white female. Michelle Long had stage IIA ductal carcinoma the left breast. Michelle Long completed tamoxifen in September 2017.  Again, Michelle Long now has this lesion that was removed.  It was a complex sclerosing lesion with usual ductal hyperplasia.  We have her on Femara.  I think this is a good idea for her as a preventive measure.  I do not see any problems at all with respect to the breast pathology.  I know Michelle Long will have a wonderful Christmas.  We will plan to get her back in 4 months now.  I would like to get her through the Winter so Michelle Long does not have to come in with any potential bad weather.    Volanda Napoleon, MD 12/8/20222:10 PM

## 2021-10-01 ENCOUNTER — Ambulatory Visit (INDEPENDENT_AMBULATORY_CARE_PROVIDER_SITE_OTHER): Payer: Medicare HMO

## 2021-10-01 ENCOUNTER — Encounter: Payer: Self-pay | Admitting: Family Medicine

## 2021-10-01 VITALS — Ht 64.0 in | Wt 155.0 lb

## 2021-10-01 DIAGNOSIS — Z0000 Encounter for general adult medical examination without abnormal findings: Secondary | ICD-10-CM | POA: Diagnosis not present

## 2021-10-01 DIAGNOSIS — R300 Dysuria: Secondary | ICD-10-CM

## 2021-10-01 NOTE — Progress Notes (Signed)
Subjective:   Michelle Long is a 75 y.o. female who presents for Medicare Annual (Subsequent) preventive examination.  I connected with Braelynn today by telephone and verified that I am speaking with the correct person using two identifiers. Location patient: home Location provider: work Persons participating in the virtual visit: patient, Marine scientist.    I discussed the limitations, risks, security and privacy concerns of performing an evaluation and management service by telephone and the availability of in person appointments. I also discussed with the patient that there may be a patient responsible charge related to this service. The patient expressed understanding and verbally consented to this telephonic visit.    Interactive audio and video telecommunications were attempted between this provider and patient, however failed, due to patient having technical difficulties OR patient did not have access to video capability.  We continued and completed visit with audio only.  Some vital signs may be absent or patient reported.   Time Spent with patient on telephone encounter: 20 minutes   Review of Systems     Cardiac Risk Factors include: advanced age (>35mn, >>37women);dyslipidemia;diabetes mellitus;hypertension     Objective:    Today's Vitals   10/01/21 1300  Weight: 155 lb (70.3 kg)  Height: _0  (1.626 m)   Body mass index is 26.61 kg/m.  Advanced Directives 10/01/2021 06/29/2021 04/26/2021 03/12/2021 03/02/2021 09/28/2020 07/17/2020  Does Patient Have a Medical Advance Directive? _1  Yes Yes  Type of AParamed[place]of [place] will Living will;Healthcare Power of Attorney Living will;Healthcare Power of [place]ar LakeLiving will [place]ak Glen[place] will [place]Living will H[place] RiverLiving will  Does patient want to make changes to medical advance directive? - No -  Patient declined No - Patient declined - No - Patient declined - No - Patient declined  Copy of [place] Chart? No - copy requested - - - - No - copy requested No - copy requested  Would patient like information on creating a medical advance directive? - - - - - - No - Patient declined    Current Medications (verified) Outpatient Encounter Medications as of 10/01/2021  Medication Sig   albuterol (VENTOLIN HFA) 108 (90 Base) MCG/ACT inhaler INHALE TWO PUFFS INTO LUNGS EVERY 6 HOURS AS NEEDED FOR WHEEZING OR SHORTNESS OF BREATH   Alcohol Swabs (DROPSAFE ALCOHOL PREP) 70 % PADS APPLY  IN THE MORNING AND AT BEDTIME.   alendronate (FOSAMAX) 70 MG tablet TAKE 1 TABLET BY MOUTH ONCE A WEEK WITH A FULL GLASS OF WATER ON AN EMPTY STOMACH   aspirin 81 MG tablet Take 81 mg by mouth daily.   b complex vitamins tablet Take 1 tablet by mouth daily.   Blood Glucose Calibration (TRUE METRIX LEVEL 1) Low SOLN 1 each by In Vitro route as directed. E11.65   Blood Glucose Monitoring Suppl (TRUE METRIX METER) w/Device KIT Check sugars once daily as directed. E11.9   budesonide-formoterol (SYMBICORT) 160-4.5 MCG/ACT inhaler Inhale 2 puffs into the lungs in the morning and at bedtime.   Calcium Carb-Cholecalciferol (CALCIUM 1000 + D PO) Take by mouth 2 (two) times daily.   Cholecalciferol (VITAMIN D) 2000 units CAPS Take 2,000 Units by mouth daily.   citalopram (CELEXA) 10 MG tablet Take 0.5 tablets (5 mg total) by mouth at bedtime.   ezetimibe (ZETIA) 10 MG tablet TAKE 1 TABLET EVERY DAY   glucose blood (TRUE METRIX BLOOD  GLUCOSE TEST) test strip TEST BLOOD SUGAR EVERY DAY   letrozole (FEMARA) 2.5 MG tablet Take 1 tablet (2.5 mg total) by mouth daily.   LORazepam (ATIVAN) 0.5 MG tablet Take 1 tablet (0.5 mg total) by mouth 2 (two) times daily as needed.   losartan (COZAAR) 50 MG tablet TAKE 1 TABLET EVERY DAY   metFORMIN (GLUCOPHAGE) 1000 MG tablet Take 1 tablet (1,000 mg total) by mouth 2  (two) times daily with a meal.   Multiple Vitamin (MULTIVITAMIN) tablet Take 1 tablet by mouth daily.   Multiple Vitamins-Minerals (HAIR SKIN AND NAILS FORMULA) TABS Take 1 tablet by mouth daily. 2,500 mg daily   nateglinide (STARLIX) 60 MG tablet TAKE 1 TABLET THREE TIMES DAILY WITH MEALS   OLANZapine (ZYPREXA) 5 MG tablet 1/2 tablet daily (Patient taking differently: 5 mg daily.)   pantoprazole (PROTONIX) 40 MG tablet TAKE 1 TABLET EVERY DAY   rosuvastatin (CRESTOR) 5 MG tablet TAKE 1 TABLET THREE TIMES WEEKLY   Spacer/Aero Chamber Mouthpiece MISC To use with inhaler   TRUEplus Lancets 33G MISC TEST BLOOD SUGAR EVERY DAY   No facility-administered encounter medications on file as of 10/01/2021.    Allergies (verified) Fluoxetine, Lamotrigine, Sulfa antibiotics, Sulfa antibiotics, Statins, Fluoxetine hcl, and Sulfa drugs cross reactors   History: Past Medical History:  Diagnosis Date   Anxiety    Asthma    Bipolar disorder ([place])    Bladder cancer ([place])    Breast cancer ([place])    Cancer of lower-inner quadrant of left female breast ([place]) 12/08/2013   COPD (chronic obstructive pulmonary disease) ([place])    former smoker, quit 1999   Depression    Diabetes mellitus    GERD (gastroesophageal reflux disease)    Hepatitis B infection    History of transfusion of whole blood    Approximately 2009, due to breast cancer/chemo.   Hyperlipidemia    Hypertension    Personal history of chemotherapy    Personal history of radiation therapy    PONV (postoperative nausea and vomiting)    Past Surgical History:  Procedure Laterality Date   ABDOMINAL HYSTERECTOMY     APPENDECTOMY  1961   bladder cancer  2006   BREAST LUMPECTOMY Left 2007   BREAST LUMPECTOMY WITH RADIOACTIVE SEED LOCALIZATION Right 03/09/2021   Procedure: RIGHT BREAST LUMPECTOMY WITH RADIOACTIVE SEED LOCALIZATION;  Surgeon: [name], MD;  Location: [facility];  Service: General;  Laterality: Right;    [place]   Family History  Problem Relation Age of Onset   Arthritis Mother    Breast cancer Mother    Transient ischemic attack Mother    Hypertension Mother    Heart disease Father    Diabetes Father    Cancer Father        bladder cancer   Throat cancer Father    Breast cancer Sister    Social History   Socioeconomic History   Marital status: Divorced    Spouse name: Not on file   Number of children: Not on file   Years of education: Not on file   Highest education level: Not on file  Occupational History   Occupation: retired    Comment: retired  Tobacco Use   Smoking status: Former    Packs/day: 1.50    Years: 33.00    Pack years: 49.50    Types: Cigarettes    Start date: 12/08/1964    Quit date: 08/21/1998  Years since quitting: 23.1   Smokeless tobacco: Never   Tobacco comments:    quit 16 years ago  Vaping Use   Vaping Use: Never used  Substance and Sexual Activity   Alcohol use: Not Currently    Alcohol/week: 0.0 standard drinks   Drug use: No   Sexual activity: Not Currently    Partners: Male    Birth control/protection: Surgical  Other Topics Concern   Not on file  Social History Narrative   Exercise--- walks with neighbor   Social Determinants of Health   Financial Resource Strain: Low Risk    Difficulty of Paying Living Expenses: Not hard at all  Food Insecurity: No Food Insecurity   Worried About Charity fundraiser in the Last Year: Never true   Ran Out of Food in the Last Year: Never true  Transportation Needs: No Transportation Needs   Lack of Transportation (Medical): No   Lack of Transportation (Non-Medical): No  Physical Activity: Sufficiently Active   Days of Exercise per Week: 3 days   Minutes of Exercise per Session: 60 min  Stress: No Stress Concern Present   Feeling of Stress : Not at all  Social Connections: Moderately Integrated   Frequency of Communication with Friends and Family:  More than three times a week   Frequency of Social Gatherings with Friends and Family: More than three times a week   Attends Religious Services: More than 4 times per year   Active Member of [employer] or Organizations: Yes   Attends Music therapist: More than 4 times per year   Marital Status: Divorced    Tobacco Counseling Counseling given: Not Answered Tobacco comments: quit 16 years ago   Clinical Intake:  Pre-visit preparation completed: Yes  Pain : No/denies pain     BMI - recorded: 26.61 Nutritional Status: BMI 25 -29 Overweight Nutritional Risks: None Diabetes: Yes CBG done?: No Did pt. bring in CBG monitor from home?: No (phone visit)  How often do you need to have someone help you when you read instructions, pamphlets, or other written materials from your doctor or pharmacy?: 1 - Never Diabetes:  Is the patient diabetic?  Yes  If diabetic, was a CBG obtained today?  No  Did the patient bring in their glucometer from home?  No phone visit How often do you monitor your CBG's? Twice daily.   Financial Strains and Diabetes Management:  Are you having any financial strains with the device, your supplies or your medication? No .  Does the patient want to be seen by Chronic Care Management for management of their diabetes?  No  Would the patient like to be referred to a Nutritionist or for Diabetic Management?  No   Diabetic Exams:  Diabetic Eye Exam:. Overdue for diabetic eye exam. Pt has been advised about the importance in completing this exam. Patient has an upcoming appt  Diabetic Foot Exam: Completed 09/24/2021.   Interpreter Needed?: No  Information entered by :: Caroleen Hamman LPN   Activities of Daily Living In your present state of health, do you have any difficulty performing the following activities: 10/01/2021 03/09/2021  Hearing? N N  Vision? N N  Difficulty concentrating or making decisions? N N  Walking or climbing stairs? N N   Dressing or bathing? N N  Doing errands, shopping? N -  Preparing Food and eating ? N -  Using the Toilet? N -  In the past six months, have you accidently leaked  urine? N -  Do you have problems with loss of bowel control? N -  Managing your Medications? N -  Managing your Finances? N -  Housekeeping or managing your Housekeeping? N -  Some recent data might be hidden    Patient Care Team: Carollee Herter, Alferd Apa, DO as PCP - General (Family Medicine) Hale Bogus., MD as Referring Physician (Gastroenterology) Franchot Gallo, MD as Consulting Physician (Urology) Ricard Dillon, MD (Psychiatry) Trula Slade, DPM as Consulting Physician (Podiatry) Aretha Parrot as Consulting Physician (Optometry) Clearence Ped, DDS (Dentistry) [name], MD as Consulting Physician (General Surgery) Sheryn Bison, MD as Referring Physician (Dermatology)  Indicate any recent Medical Services you may have received from other than [facility] providers in the past year (date may be approximate).     Assessment:   This is a routine wellness examination for [facility].  Hearing/Vision screen Hearing Screening - Comments:: No issues Vision Screening - Comments:: Last eye exam-09/2020-Dr. Mendel Ryder  Dietary issues and exercise activities discussed: Current Exercise Habits: The patient does not participate in regular exercise at present, Exercise limited by: None identified   Goals Addressed             This Visit's Progress    Eat more fruits and vegetables   On track    Increase physical activity   On track    Begin silver sneakers     Increase water intake   On track      Depression Screen PHQ 2/9 Scores 10/01/2021 03/23/2021 09/28/2020 09/14/2019 03/09/2018 03/03/2017 12/10/2016  PHQ - 2 Score 0 0 0 0 0 0 0  PHQ- 9 Score - 0 - - - - -    Fall Risk Fall Risk  10/01/2021 03/23/2021 09/28/2020 09/14/2019 03/09/2018  Falls in the past year? 0 0 0 0 No  Number falls in past yr: 0 0 0  0 -  Injury with Fall? 0 0 0 0 -  Risk for fall due to : - - - - -  Follow up Falls prevention discussed - Falls prevention discussed Education provided;Falls prevention discussed -    FALL RISK PREVENTION PERTAINING TO THE HOME:  Any stairs in or around the home? Yes  If so, are there any without handrails? No  Home free of loose throw rugs in walkways, pet beds, electrical cords, etc? Yes  Adequate lighting in your home to reduce risk of falls? Yes   ASSISTIVE DEVICES UTILIZED TO PREVENT FALLS:  Life alert? No  Use of a cane, walker or w/c? No  Grab bars in the bathroom? No  Shower chair or bench in shower? No  Elevated toilet seat or a handicapped toilet? No   TIMED UP AND GO:  Was the test performed? No . Phone visit   Cognitive Function: MMSE - Mini Mental State Exam 03/03/2017  Orientation to time 5  Orientation to Place 5  Registration 3  Attention/ Calculation 5  Recall 3  Language- name 2 objects 2  Language- repeat 1  Language- follow 3 step command 3  Language- read & follow direction 1  Write a sentence 1  Copy design 1  Total score 30     6CIT Screen 09/28/2020 09/14/2019  What Year? 0 points 0 points  What month? 0 points 0 points  What time? 0 points 0 points  Count back from 20 0 points 0 points  Months in reverse 0 points 0 points  Repeat phrase 0 points 0 points  Total Score 0 0    Immunizations Immunization History  Administered Date(s) Administered   Fluad Quad(high Dose 65+) 07/13/2019, 07/17/2020   Influenza Whole 07/01/2012   Influenza, High Dose Seasonal PF 06/19/2017, 07/09/2018   Influenza,inj,Quad PF,6+ Mos 08/12/2013, 07/22/2014, 07/03/2016   Influenza-Unspecified 09/20/2015, 06/29/2017, 07/23/2021   Moderna SARS-COV2 Booster Vaccination 08/14/2020, 02/15/2021   Moderna Sars-Covid-2 Vaccination 11/11/2019, 11/11/2019, 12/17/2019   Pfizer Covid-19 Vaccine Bivalent Booster 50yr & up 07/24/2021   Pneumococcal Conjugate-13  01/30/2015   Pneumococcal Polysaccharide-23 10/23/2003, 10/22/2013, 03/23/2021   Td 06/22/2013   Tdap 09/20/2015   Zoster Recombinat (Shingrix) 07/25/2019   Zoster, Live 09/07/2013    TDAP status: Up to date  Flu Vaccine status: Up to date  Pneumococcal vaccine status: Up to date  Covid-19 vaccine status: Completed vaccines  Qualifies for Shingles Vaccine? No   Zostavax completed Yes   Shingrix Completed?: Yes  Screening Tests Health Maintenance  Topic Date Due   Zoster Vaccines- Shingrix (2 of 2) 09/19/2019   COVID-19 Vaccine (4 - Booster) 09/18/2021   OPHTHALMOLOGY EXAM  09/19/2021   HEMOGLOBIN A1C  03/28/2022   FOOT EXAM  09/24/2022   COLONOSCOPY (Pts 45-471yrInsurance coverage will need to be confirmed)  06/04/2023   TETANUS/TDAP  09/19/2025   Pneumonia Vaccine 6521Years old  Completed   INFLUENZA VACCINE  Completed   DEXA SCAN  Completed   Hepatitis C Screening  Completed   HPV VACCINES  Aged Out    Health Maintenance  Health Maintenance Due  Topic Date Due   Zoster Vaccines- Shingrix (2 of 2) 09/19/2019   COVID-19 Vaccine (4 - Booster) 09/18/2021   OPHTHALMOLOGY EXAM  09/19/2021    Colorectal cancer screening: Type of screening: Colonoscopy. Completed 06/03/2018. Repeat every 5 years  Mammogram status: Completed bilateral 07/13/2021. Repeat every year  Bone Density status: Completed 08/14/2020. Results reflect: Bone density results: OSTEOPENIA. Repeat every 2 years.  Lung Cancer Screening: (Low Dose CT Chest recommended if Age 75-80ears, 30 pack-year currently smoking OR have quit w/in 15years.) does not qualify.     Additional Screening:  Hepatitis C Screening: Completed 11/17/2015  Vision Screening: Recommended annual ophthalmology exams for early detection of glaucoma and other disorders of the eye. Is the patient up to date with their annual eye exam?  No  Who is the provider or what is the name of the office in which the patient attends annual  eye exams? Dr. LiMendel Ryder Dental Screening: Recommended annual dental exams for proper oral hygiene  Community Resource Referral / Chronic Care Management: CRR required this visit?  No   CCM required this visit?  No      Plan:     I have personally reviewed and noted the following in the patient's chart:   Medical and social history Use of alcohol, tobacco or illicit drugs  Current medications and supplements including opioid prescriptions.  Functional ability and status Nutritional status Physical activity Advanced directives List of other physicians Hospitalizations, surgeries, and ER visits in previous 12 months Vitals Screenings to include cognitive, depression, and falls Referrals and appointments  In addition, I have reviewed and discussed with patient certain preventive protocols, quality metrics, and best practice recommendations. A written personalized care plan for preventive services as well as general preventive health recommendations were provided to patient.   Due to this being a telephonic visit, the after visit summary with patients personalized plan was offered to patient via mail or my-chart. Patient would like to access  on my-chart.   Marta Antu, LPN   [id number]  Nurse Health Advisor  Nurse Notes: None

## 2021-10-01 NOTE — Patient Instructions (Signed)
Michelle Long , Thank you for taking time to complete your Medicare Wellness Visit. I appreciate your ongoing commitment to your health goals. Please review the following plan we discussed and let me know if I can assist you in the future.   Screening recommendations/referrals: Colonoscopy: Completed 06/03/2018-Due 06/04/2023 Mammogram: Completed 07/13/2021-Due 07/13/2022 Bone Density: Completed 08/14/2020-Due 08/14/2022 Recommended yearly ophthalmology/optometry visit for glaucoma screening and checkup Recommended yearly dental visit for hygiene and checkup  Vaccinations: Influenza vaccine: Up to date Pneumococcal vaccine: Up to date Tdap vaccine: Up to date Shingles vaccine: Completed vaccines   Covid-19:Up to date  Advanced directives: Please bring a copy of Living Will and/or Healthcare Power of Attorney for your chart.   Conditions/risks identified: See problem list  Next appointment: Follow up in one year for your annual wellness visit 10/03/2022 @ 1:00   Preventive Care 65 Years and Older, Female Preventive care refers to lifestyle choices and visits with your health care provider that can promote health and wellness. What does preventive care include? A yearly physical exam. This is also called an annual well check. Dental exams once or twice a year. Routine eye exams. Ask your health care provider how often you should have your eyes checked. Personal lifestyle choices, including: Daily care of your teeth and gums. Regular physical activity. Eating a healthy diet. Avoiding tobacco and drug use. Limiting alcohol use. Practicing safe sex. Taking low-dose aspirin every day. Taking vitamin and mineral supplements as recommended by your health care provider. What happens during an annual well check? The services and screenings done by your health care provider during your annual well check will depend on your age, overall health, lifestyle risk factors, and family history of  disease. Counseling  Your health care provider may ask you questions about your: Alcohol use. Tobacco use. Drug use. Emotional well-being. Home and relationship well-being. Sexual activity. Eating habits. History of falls. Memory and ability to understand (cognition). Work and work Statistician. Reproductive health. Screening  You may have the following tests or measurements: Height, weight, and BMI. Blood pressure. Lipid and cholesterol levels. These may be checked every 5 years, or more frequently if you are over 38 years old. Skin check. Lung cancer screening. You may have this screening every year starting at age 59 if you have a 30-pack-year history of smoking and currently smoke or have quit within the past 15 years. Fecal occult blood test (FOBT) of the stool. You may have this test every year starting at age 46. Flexible sigmoidoscopy or colonoscopy. You may have a sigmoidoscopy every 5 years or a colonoscopy every 10 years starting at age 6. Hepatitis C blood test. Hepatitis B blood test. Sexually transmitted disease (STD) testing. Diabetes screening. This is done by checking your blood sugar (glucose) after you have not eaten for a while (fasting). You may have this done every 1-3 years. Bone density scan. This is done to screen for osteoporosis. You may have this done starting at age 56. Mammogram. This may be done every 1-2 years. Talk to your health care provider about how often you should have regular mammograms. Talk with your health care provider about your test results, treatment options, and if necessary, the need for more tests. Vaccines  Your health care provider may recommend certain vaccines, such as: Influenza vaccine. This is recommended every year. Tetanus, diphtheria, and acellular pertussis (Tdap, Td) vaccine. You may need a Td booster every 10 years. Zoster vaccine. You may need this after age 72. Pneumococcal 13-valent  conjugate (PCV13) vaccine. One  dose is recommended after age 45. Pneumococcal polysaccharide (PPSV23) vaccine. One dose is recommended after age 109. Talk to your health care provider about which screenings and vaccines you need and how often you need them. This information is not intended to replace advice given to you by your health care provider. Make sure you discuss any questions you have with your health care provider. Document Released: 11/03/2015 Document Revised: 06/26/2016 Document Reviewed: 08/08/2015 Elsevier Interactive Patient Education  2017 [place] Prevention in the Home Falls can cause injuries. They can happen to people of all ages. There are many things you can do to make your home safe and to help prevent falls. What can I do on the outside of my home? Regularly fix the edges of walkways and driveways and fix any cracks. Remove anything that might make you trip as you walk through a door, such as a raised step or threshold. Trim any bushes or trees on the path to your home. Use bright outdoor lighting. Clear any walking paths of anything that might make someone trip, such as rocks or tools. Regularly check to see if handrails are loose or broken. Make sure that both sides of any steps have handrails. Any raised decks and porches should have guardrails on the edges. Have any leaves, snow, or ice cleared regularly. Use sand or salt on walking paths during winter. Clean up any spills in your garage right away. This includes oil or grease spills. What can I do in the bathroom? Use night lights. Install grab bars by the toilet and in the tub and shower. Do not use towel bars as grab bars. Use non-skid mats or decals in the tub or shower. If you need to sit down in the shower, use a plastic, non-slip stool. Keep the floor dry. Clean up any water that spills on the floor as soon as it happens. Remove soap buildup in the tub or shower regularly. Attach bath mats securely with double-sided  non-slip rug tape. Do not have throw rugs and other things on the floor that can make you trip. What can I do in the bedroom? Use night lights. Make sure that you have a light by your bed that is easy to reach. Do not use any sheets or blankets that are too big for your bed. They should not hang down onto the floor. Have a firm chair that has side arms. You can use this for support while you get dressed. Do not have throw rugs and other things on the floor that can make you trip. What can I do in the kitchen? Clean up any spills right away. Avoid walking on wet floors. Keep items that you use a lot in easy-to-reach places. If you need to reach something above you, use a strong step stool that has a grab bar. Keep electrical cords out of the way. Do not use floor polish or wax that makes floors slippery. If you must use wax, use non-skid floor wax. Do not have throw rugs and other things on the floor that can make you trip. What can I do with my stairs? Do not leave any items on the stairs. Make sure that there are handrails on both sides of the stairs and use them. Fix handrails that are broken or loose. Make sure that handrails are as long as the stairways. Check any carpeting to make sure that it is firmly attached to the stairs. Fix any carpet that  is loose or worn. Avoid having throw rugs at the top or bottom of the stairs. If you do have throw rugs, attach them to the floor with carpet tape. Make sure that you have a light switch at the top of the stairs and the bottom of the stairs. If you do not have them, ask someone to add them for you. What else can I do to help prevent falls? Wear shoes that: Do not have high heels. Have rubber bottoms. Are comfortable and fit you well. Are closed at the toe. Do not wear sandals. If you use a stepladder: Make sure that it is fully opened. Do not climb a closed stepladder. Make sure that both sides of the stepladder are locked into place. Ask  someone to hold it for you, if possible. Clearly mark and make sure that you can see: Any grab bars or handrails. First and last steps. Where the edge of each step is. Use tools that help you move around (mobility aids) if they are needed. These include: Canes. Walkers. Scooters. Crutches. Turn on the lights when you go into a dark area. Replace any light bulbs as soon as they burn out. Set up your furniture so you have a clear path. Avoid moving your furniture around. If any of your floors are uneven, fix them. If there are any pets around you, be aware of where they are. Review your medicines with your doctor. Some medicines can make you feel dizzy. This can increase your chance of falling. Ask your doctor what other things that you can do to help prevent falls. This information is not intended to replace advice given to you by your health care provider. Make sure you discuss any questions you have with your health care provider. Document Released: 08/03/2009 Document Revised: 03/14/2016 Document Reviewed: 11/11/2014 Elsevier Interactive Patient Education  2017 [employer].

## 2021-10-03 ENCOUNTER — Other Ambulatory Visit: Payer: Medicare HMO

## 2021-10-03 DIAGNOSIS — R300 Dysuria: Secondary | ICD-10-CM | POA: Diagnosis not present

## 2021-10-04 ENCOUNTER — Ambulatory Visit: Payer: Medicare HMO

## 2021-10-04 LAB — URINE CULTURE
MICRO NUMBER:: 12756572
SPECIMEN QUALITY:: ADEQUATE

## 2021-10-10 DIAGNOSIS — F250 Schizoaffective disorder, bipolar type: Secondary | ICD-10-CM | POA: Diagnosis not present

## 2021-10-18 DIAGNOSIS — H35032 Hypertensive retinopathy, left eye: Secondary | ICD-10-CM | POA: Diagnosis not present

## 2021-10-18 DIAGNOSIS — E119 Type 2 diabetes mellitus without complications: Secondary | ICD-10-CM | POA: Diagnosis not present

## 2021-10-18 DIAGNOSIS — Z0100 Encounter for examination of eyes and vision without abnormal findings: Secondary | ICD-10-CM | POA: Diagnosis not present

## 2021-10-18 DIAGNOSIS — H524 Presbyopia: Secondary | ICD-10-CM | POA: Diagnosis not present

## 2021-10-30 ENCOUNTER — Other Ambulatory Visit: Payer: Self-pay | Admitting: Family Medicine

## 2021-10-30 DIAGNOSIS — E1165 Type 2 diabetes mellitus with hyperglycemia: Secondary | ICD-10-CM

## 2021-11-13 ENCOUNTER — Encounter: Payer: Self-pay | Admitting: Family Medicine

## 2021-11-13 DIAGNOSIS — J4520 Mild intermittent asthma, uncomplicated: Secondary | ICD-10-CM

## 2021-11-14 MED ORDER — BUDESONIDE-FORMOTEROL FUMARATE 160-4.5 MCG/ACT IN AERO: 2.0000 | INHALATION_SPRAY | Freq: Two times a day (BID) | RESPIRATORY_TRACT | 1 refills | Status: DC

## 2022-01-04 ENCOUNTER — Other Ambulatory Visit: Payer: Self-pay

## 2022-01-04 MED ORDER — METFORMIN HCL 1000 MG PO TABS: 1000.0000 mg | ORAL_TABLET | Freq: Two times a day (BID) | ORAL | 1 refills | Status: DC

## 2022-01-16 ENCOUNTER — Encounter: Payer: Self-pay | Admitting: Family Medicine

## 2022-01-16 DIAGNOSIS — J4520 Mild intermittent asthma, uncomplicated: Secondary | ICD-10-CM

## 2022-01-18 MED ORDER — BUDESONIDE-FORMOTEROL FUMARATE 160-4.5 MCG/ACT IN AERO: 2.0000 | INHALATION_SPRAY | Freq: Two times a day (BID) | RESPIRATORY_TRACT | 1 refills | Status: DC

## 2022-01-23 ENCOUNTER — Encounter (HOSPITAL_COMMUNITY): Payer: Self-pay

## 2022-01-24 ENCOUNTER — Inpatient Hospital Stay: Payer: Medicare HMO | Admitting: Hematology & Oncology

## 2022-01-24 ENCOUNTER — Other Ambulatory Visit: Payer: Self-pay

## 2022-01-24 ENCOUNTER — Inpatient Hospital Stay: Payer: Medicare HMO | Attending: Family Medicine

## 2022-01-24 ENCOUNTER — Encounter: Payer: Self-pay | Admitting: Hematology & Oncology

## 2022-01-24 VITALS — BP 135/64 | HR 90 | Temp 98.1°F | Resp 18 | Ht 64.0 in | Wt 151.0 lb

## 2022-01-24 DIAGNOSIS — Z853 Personal history of malignant neoplasm of breast: Secondary | ICD-10-CM | POA: Insufficient documentation

## 2022-01-24 DIAGNOSIS — J449 Chronic obstructive pulmonary disease, unspecified: Secondary | ICD-10-CM | POA: Diagnosis not present

## 2022-01-24 DIAGNOSIS — F319 Bipolar disorder, unspecified: Secondary | ICD-10-CM | POA: Diagnosis not present

## 2022-01-24 DIAGNOSIS — C50312 Malignant neoplasm of lower-inner quadrant of left female breast: Secondary | ICD-10-CM

## 2022-01-24 DIAGNOSIS — N62 Hypertrophy of breast: Secondary | ICD-10-CM | POA: Insufficient documentation

## 2022-01-24 LAB — CBC WITH DIFFERENTIAL (CANCER CENTER ONLY)
Abs Immature Granulocytes: 0.02 10*3/uL (ref 0.00–0.07)
Basophils Absolute: 0.1 10*3/uL (ref 0.0–0.1)
Basophils Relative: 1 %
Eosinophils Absolute: 0.2 10*3/uL (ref 0.0–0.5)
Eosinophils Relative: 3 %
HCT: 44.2 % (ref 36.0–46.0)
Hemoglobin: 14.1 g/dL (ref 12.0–15.0)
Immature Granulocytes: 0 %
Lymphocytes Relative: 29 %
Lymphs Abs: 1.6 10*3/uL (ref 0.7–4.0)
MCH: 29.2 pg (ref 26.0–34.0)
MCHC: 31.9 g/dL (ref 30.0–36.0)
MCV: 91.5 fL (ref 80.0–100.0)
Monocytes Absolute: 0.5 10*3/uL (ref 0.1–1.0)
Monocytes Relative: 9 %
Neutro Abs: 3.3 10*3/uL (ref 1.7–7.7)
Neutrophils Relative %: 58 %
Platelet Count: 188 10*3/uL (ref 150–400)
RBC: 4.83 MIL/uL (ref 3.87–5.11)
RDW: 13.6 % (ref 11.5–15.5)
WBC Count: 5.6 10*3/uL (ref 4.0–10.5)
nRBC: 0 % (ref 0.0–0.2)

## 2022-01-24 LAB — CMP (CANCER CENTER ONLY)
ALT: 46 U/L — ABNORMAL HIGH (ref 0–44)
AST: 38 U/L (ref 15–41)
Albumin: 4.6 g/dL (ref 3.5–5.0)
Alkaline Phosphatase: 35 U/L — ABNORMAL LOW (ref 38–126)
Anion gap: 11 (ref 5–15)
BUN: 12 mg/dL (ref 8–23)
CO2: 28 mmol/L (ref 22–32)
Calcium: 9.9 mg/dL (ref 8.9–10.3)
Chloride: 101 mmol/L (ref 98–111)
Creatinine: 0.88 mg/dL (ref 0.44–1.00)
GFR, Estimated: >60 mL/min (ref >60)
Glucose, Bld: 132 mg/dL — ABNORMAL HIGH (ref 70–99)
Potassium: 4.4 mmol/L (ref 3.5–5.1)
Sodium: 140 mmol/L (ref 135–145)
Total Bilirubin: 0.5 mg/dL (ref 0.3–1.2)
Total Protein: 7.1 g/dL (ref 6.5–8.1)

## 2022-01-24 LAB — LACTATE DEHYDROGENASE: LDH: 155 U/L (ref 98–192)

## 2022-01-24 NOTE — Progress Notes (Signed)
?Hematology and Oncology Follow Up Visit ? ?Michelle Long ?[id number] ?05-16-1946 76 y.o. ?01/24/2022 ? ? ?Principle Diagnosis:  ?Stage IIA (T2 N0M0) infiltrating ductal carcinoma of the left breast ?Usual Ductal Hyperplasia -- Complex sclerosing  ? ? ?Current Therapy:   ?Tamoxifen 20 mg by mouth daily - finished in September 2017 ?Femara 2.5 mg p.o. daily --started on 04/02/2021  ? ?   ?Interim History:  Michelle Long is here for follow-up.  We last saw her back before Christmas.  Since then, she has been doing quite well.  Her blood sugars seem to be under better control.  Her blood sugar today was 132. ? ?She has had no problems with nausea or vomiting.  There has been no cough or shortness of breath.  She has had no fever.  There is been no bleeding.  She has had no leg swelling.  She has had no tingling in the hands or feet. ? ?She has had no problems with headache. ? ?She had a breast MRI back in November.  Everything looked fine on the breast MRI. ? ?Overall, I would say performance status is ECOG 1.  ? ?Medications:  ?Allergies as of 01/24/2022   ? ?   Reactions  ? Fluoxetine Other (See Comments)  ? seizure  ? Fluoxetine Hcl Other (See Comments)  ? seizure  ? Lamotrigine Itching  ? Sulfa Antibiotics Shortness Of Breath  ? Sulfa Antibiotics   ? Sulfa Drugs Cross Reactors Shortness Of Breath  ?   ? Statins Other (See Comments)  ? Muscle pain  ? ?  ? ?  ?Medication List  ?  ? ?  ? Accurate as of January 24, 2022  2:12 PM. If you have any questions, ask your nurse or doctor.  ?  ?  ? ?  ? ?STOP taking these medications   ? ?b complex vitamins tablet ?Stopped by: Volanda Napoleon, MD ?  ? ?  ? ?TAKE these medications   ? ?albuterol 108 (90 Base) MCG/ACT inhaler ?Commonly known as: Ventolin HFA ?INHALE TWO PUFFS INTO LUNGS EVERY 6 HOURS AS NEEDED FOR WHEEZING OR SHORTNESS OF BREATH ?  ?alendronate 70 MG tablet ?Commonly known as: FOSAMAX ?TAKE 1 TABLET BY MOUTH ONCE A WEEK WITH A FULL GLASS OF WATER ON AN EMPTY STOMACH ?   ?aspirin 81 MG tablet ?Take 81 mg by mouth daily. ?  ?budesonide-formoterol 160-4.5 MCG/ACT inhaler ?Commonly known as: Symbicort ?Inhale 2 puffs into the lungs in the morning and at bedtime. ID# [id number] ?  ?CALCIUM 1000 + D PO ?Take by mouth 2 (two) times daily. ?  ?citalopram 10 MG tablet ?Commonly known as: CELEXA ?Take 0.5 tablets (5 mg total) by mouth at bedtime. ?  ?DropSafe Alcohol Prep 70 % Pads ?USE  IN THE MORNING AND AT BEDTIME. ?  ?ezetimibe 10 MG tablet ?Commonly known as: ZETIA ?TAKE 1 TABLET EVERY DAY ?  ?Hair Skin and Nails Formula Tabs ?Take 1 tablet by mouth daily. 2,500 mg daily ?  ?letrozole 2.5 MG tablet ?Commonly known as: Petroleum ?Take 1 tablet (2.5 mg total) by mouth daily. ?  ?LORazepam 0.5 MG tablet ?Commonly known as: ATIVAN ?Take 1 tablet (0.5 mg total) by mouth 2 (two) times daily as needed. ?  ?losartan 50 MG tablet ?Commonly known as: COZAAR ?TAKE 1 TABLET EVERY DAY ?  ?metFORMIN 1000 MG tablet ?Commonly known as: GLUCOPHAGE ?Take 1 tablet (1,000 mg total) by mouth 2 (two) times daily with a meal. ?  ?  multivitamin tablet ?Take 1 tablet by mouth daily. ?  ?nateglinide 60 MG tablet ?Commonly known as: STARLIX ?TAKE 1 TABLET THREE TIMES DAILY WITH MEALS ?  ?OLANZapine 5 MG tablet ?Commonly known as: ZYPREXA ?1/2 tablet daily ?What changed:  ?how much to take ?when to take this ?additional instructions ?  ?pantoprazole 40 MG tablet ?Commonly known as: PROTONIX ?TAKE 1 TABLET EVERY DAY ?  ?rosuvastatin 5 MG tablet ?Commonly known as: CRESTOR ?TAKE 1 TABLET THREE TIMES WEEKLY ?  ?Spacer/Aero Chamber Mouthpiece Misc ?To use with inhaler ?  ?True Metrix Blood Glucose Test test strip ?Generic drug: glucose blood ?TEST BLOOD SUGAR EVERY DAY ?  ?True Metrix Level 1 Low Soln ?1 each by In Vitro route as directed. E11.65 ?  ?True Metrix Meter w/Device Kit ?Check sugars once daily as directed. E11.9 ?  ?TRUEplus Lancets 33G Misc ?TEST BLOOD SUGAR EVERY DAY ?  ?Vitamin D 50 MCG (2000 UT)  Caps ?Take 2,000 Units by mouth daily. ?  ? ?  ? ? ?Allergies:  ?Allergies  ?Allergen Reactions  ? Fluoxetine Other (See Comments)  ?  seizure  ? Fluoxetine Hcl Other (See Comments)  ?  seizure  ? Lamotrigine Itching  ? Sulfa Antibiotics Shortness Of Breath  ? Sulfa Antibiotics   ? Sulfa Drugs Cross Reactors Shortness Of Breath  ?   ?  ? Statins Other (See Comments)  ?  Muscle pain  ? ? ?Past Medical History, Surgical history, Social history, and Family History were reviewed and updated. ? ?Review of Systems: ?Review of Systems  ?Constitutional: Negative.   ?HENT: Negative.    ?Eyes: Negative.   ?Respiratory: Negative.    ?Cardiovascular: Negative.   ?Gastrointestinal: Negative.   ?Genitourinary: Negative.   ?Musculoskeletal: Negative.   ?Skin: Negative.   ?Neurological: Negative.   ?Endo/Heme/Allergies: Negative.   ?Psychiatric/Behavioral: Negative.    ? ? ?Physical Exam: ? height is '5\' 4"'  (1.626 m) and weight is 151 lb (68.5 kg). Her oral temperature is 98.1 ?F (36.7 ?C). Her blood pressure is 135/64 and her pulse is 90. Her respiration is 18 and oxygen saturation is 96%.  ? ?Wt Readings from Last 3 Encounters:  ?01/24/22 151 lb (68.5 kg)  ?10/01/21 155 lb (70.3 kg)  ?09/27/21 155 lb (70.3 kg)  ? ? ?Physical Exam ?Vitals reviewed.  ?Constitutional:   ?   Comments: Her breast exam shows her right breast with no masses, edema or erythema. There is no right axillary adenopathy. Left breast is somewhat contracted from surgery and radiation. She has a well-healed lumpectomy scar at the 6:00 position. There is some slight firmness at the lumpectomy site. There is no distinct mass. There is no left axillary adenopathy.  ?HENT:  ?   Head: Normocephalic and atraumatic.  ?Eyes:  ?   Pupils: Pupils are equal, round, and reactive to light.  ?Cardiovascular:  ?   Rate and Rhythm: Normal rate and regular rhythm.  ?   Heart sounds: Normal heart sounds.  ?Pulmonary:  ?   Effort: Pulmonary effort is normal.  ?   Breath sounds:  Normal breath sounds.  ?Abdominal:  ?   General: Bowel sounds are normal.  ?   Palpations: Abdomen is soft.  ?Musculoskeletal:     ?   General: No tenderness or deformity. Normal range of motion.  ?   Cervical back: Normal range of motion.  ?Lymphadenopathy:  ?   Cervical: No cervical adenopathy.  ?Skin: ?   General: Skin is warm and  dry.  ?   Findings: No erythema or rash.  ?Neurological:  ?   Mental Status: She is alert and oriented to person, place, and time.  ?Psychiatric:     ?   Behavior: Behavior normal.     ?   Thought Content: Thought content normal.     ?   Judgment: Judgment normal.  ? ?athy.  ? ?Lab Results  ?Component Value Date  ? WBC 5.6 01/24/2022  ? HGB 14.1 01/24/2022  ? HCT 44.2 01/24/2022  ? MCV 91.5 01/24/2022  ? PLT 188 01/24/2022  ? ?No results found for: FERRITIN, IRON, TIBC, UIBC, IRONPCTSAT ?Lab Results  ?Component Value Date  ? RBC 4.83 01/24/2022  ? ?No results found for: KPAFRELGTCHN, LAMBDASER, KAPLAMBRATIO ?No results found for: IGGSERUM, IGA, IGMSERUM ?No results found for: TOTALPROTELP, ALBUMINELP, A1GS, A2GS, BETS, BETA2SER, GAMS, MSPIKE, SPEI ?  Chemistry   ?   ?Component Value Date/Time  ? NA 140 01/24/2022 1330  ? NA 139 07/02/2017 1445  ? NA 142 01/01/2017 1059  ? K 4.4 01/24/2022 1330  ? K 4.6 07/02/2017 1445  ? K 4.6 01/01/2017 1059  ? CL 101 01/24/2022 1330  ? CL 103 07/02/2017 1445  ? CL 105 01/20/2015 1240  ? CO2 28 01/24/2022 1330  ? CO2 28 07/02/2017 1445  ? CO2 26 01/01/2017 1059  ? BUN 12 01/24/2022 1330  ? BUN 10 07/02/2017 1445  ? BUN 10.6 01/01/2017 1059  ? CREATININE 0.88 01/24/2022 1330  ? CREATININE 0.68 07/17/2020 1105  ? CREATININE 0.8 01/01/2017 1059  ?    ?Component Value Date/Time  ? CALCIUM 9.9 01/24/2022 1330  ? CALCIUM 10.3 07/02/2017 1445  ? CALCIUM 10.1 01/01/2017 1059  ? ALKPHOS 35 (L) 01/24/2022 1330  ? ALKPHOS 47 07/02/2017 1445  ? ALKPHOS 49 01/01/2017 1059  ? AST 38 01/24/2022 1330  ? AST 60 (H) 01/01/2017 1059  ? ALT 46 (H) 01/24/2022 1330  ? ALT  63 (H) 01/01/2017 1059  ? BILITOT 0.5 01/24/2022 1330  ? BILITOT 0.64 01/01/2017 1059  ?  ? ?Impression and Plan: Ms. Hudman is a most delightful 76 year old postmenopausal white female. She had stage IIA ductal carcinom

## 2022-03-01 ENCOUNTER — Other Ambulatory Visit: Payer: Self-pay | Admitting: Family Medicine

## 2022-03-01 DIAGNOSIS — K219 Gastro-esophageal reflux disease without esophagitis: Secondary | ICD-10-CM

## 2022-03-04 ENCOUNTER — Other Ambulatory Visit: Payer: Self-pay | Admitting: Family Medicine

## 2022-03-04 DIAGNOSIS — J4520 Mild intermittent asthma, uncomplicated: Secondary | ICD-10-CM

## 2022-03-14 ENCOUNTER — Other Ambulatory Visit: Payer: Self-pay | Admitting: Hematology & Oncology

## 2022-03-22 DIAGNOSIS — C50912 Malignant neoplasm of unspecified site of left female breast: Secondary | ICD-10-CM | POA: Diagnosis not present

## 2022-03-25 ENCOUNTER — Encounter: Payer: Self-pay | Admitting: Family Medicine

## 2022-03-25 ENCOUNTER — Ambulatory Visit (INDEPENDENT_AMBULATORY_CARE_PROVIDER_SITE_OTHER): Payer: Medicare HMO | Admitting: Family Medicine

## 2022-03-25 ENCOUNTER — Other Ambulatory Visit: Payer: Self-pay | Admitting: Family Medicine

## 2022-03-25 VITALS — BP 128/82 | HR 100 | Temp 98.4°F | Resp 18 | Ht 64.0 in | Wt 151.0 lb

## 2022-03-25 DIAGNOSIS — I10 Essential (primary) hypertension: Secondary | ICD-10-CM | POA: Diagnosis not present

## 2022-03-25 DIAGNOSIS — Z0000 Encounter for general adult medical examination without abnormal findings: Secondary | ICD-10-CM

## 2022-03-25 DIAGNOSIS — E1165 Type 2 diabetes mellitus with hyperglycemia: Secondary | ICD-10-CM | POA: Diagnosis not present

## 2022-03-25 DIAGNOSIS — E1169 Type 2 diabetes mellitus with other specified complication: Secondary | ICD-10-CM | POA: Diagnosis not present

## 2022-03-25 DIAGNOSIS — J449 Chronic obstructive pulmonary disease, unspecified: Secondary | ICD-10-CM | POA: Diagnosis not present

## 2022-03-25 DIAGNOSIS — E785 Hyperlipidemia, unspecified: Secondary | ICD-10-CM | POA: Diagnosis not present

## 2022-03-25 DIAGNOSIS — F319 Bipolar disorder, unspecified: Secondary | ICD-10-CM | POA: Diagnosis not present

## 2022-03-25 LAB — COMPREHENSIVE METABOLIC PANEL
ALT: 33 U/L (ref 0–35)
AST: 31 U/L (ref 0–37)
Albumin: 4.4 g/dL (ref 3.5–5.2)
Alkaline Phosphatase: 33 U/L — ABNORMAL LOW (ref 39–117)
BUN: 12 mg/dL (ref 6–23)
CO2: 29 mEq/L (ref 19–32)
Calcium: 10 mg/dL (ref 8.4–10.5)
Chloride: 102 mEq/L (ref 96–112)
Creatinine, Ser: 0.86 mg/dL (ref 0.40–1.20)
GFR: 65.83 mL/min (ref >60.00)
Glucose, Bld: 106 mg/dL — ABNORMAL HIGH (ref 70–99)
Potassium: 4.8 mEq/L (ref 3.5–5.1)
Sodium: 140 mEq/L (ref 135–145)
Total Bilirubin: 0.4 mg/dL (ref 0.2–1.2)
Total Protein: 6.8 g/dL (ref 6.0–8.3)

## 2022-03-25 LAB — CBC WITH DIFFERENTIAL/PLATELET
Basophils Absolute: 0 K/uL (ref 0.0–0.1)
Basophils Relative: 0.7 % (ref 0.0–3.0)
Eosinophils Absolute: 0.2 K/uL (ref 0.0–0.7)
Eosinophils Relative: 3.3 % (ref 0.0–5.0)
HCT: 40.7 % (ref 36.0–46.0)
Hemoglobin: 13.3 g/dL (ref 12.0–15.0)
Lymphocytes Relative: 27.4 % (ref 12.0–46.0)
Lymphs Abs: 1.7 K/uL (ref 0.7–4.0)
MCHC: 32.6 g/dL (ref 30.0–36.0)
MCV: 89.9 fl (ref 78.0–100.0)
Monocytes Absolute: 0.6 K/uL (ref 0.1–1.0)
Monocytes Relative: 9.8 % (ref 3.0–12.0)
Neutro Abs: 3.6 K/uL (ref 1.4–7.7)
Neutrophils Relative %: 58.8 % (ref 43.0–77.0)
Platelets: 176 K/uL (ref 150.0–400.0)
RBC: 4.53 Mil/uL (ref 3.87–5.11)
RDW: 14.8 % (ref 11.5–15.5)
WBC: 6.1 K/uL (ref 4.0–10.5)

## 2022-03-25 LAB — MICROALBUMIN / CREATININE URINE RATIO
Creatinine,U: 111.1 mg/dL
Microalb Creat Ratio: 2.6 mg/g (ref 0.0–30.0)
Microalb, Ur: 2.8 mg/dL — ABNORMAL HIGH (ref 0.0–1.9)

## 2022-03-25 LAB — LIPID PANEL
Cholesterol: 136 mg/dL (ref 0–200)
HDL: 51 mg/dL (ref >39.00)
LDL Cholesterol: 61 mg/dL (ref 0–99)
NonHDL: 85.45
Total CHOL/HDL Ratio: 3
Triglycerides: 120 mg/dL (ref 0.0–149.0)
VLDL: 24 mg/dL (ref 0.0–40.0)

## 2022-03-25 LAB — HEMOGLOBIN A1C: Hgb A1c MFr Bld: 6.3 % (ref 4.6–6.5)

## 2022-03-25 NOTE — Patient Instructions (Signed)
Preventive Care 81 Years and Older, Female Preventive care refers to lifestyle choices and visits with your health care provider that can promote health and wellness. Preventive care visits are also called wellness exams. What can I expect for my preventive care visit? Counseling Your health care provider may ask you questions about your: Medical history, including: Past medical problems. Family medical history. Pregnancy and menstrual history. History of falls. Current health, including: Memory and ability to understand (cognition). Emotional well-being. Home life and relationship well-being. Sexual activity and sexual health. Lifestyle, including: Alcohol, nicotine or tobacco, and drug use. Access to firearms. Diet, exercise, and sleep habits. Work and work Statistician. Sunscreen use. Safety issues such as seatbelt and bike helmet use. Physical exam Your health care provider will check your: Height and weight. These may be used to calculate your BMI (body mass index). BMI is a measurement that tells if you are at a healthy weight. Waist circumference. This measures the distance around your waistline. This measurement also tells if you are at a healthy weight and may help predict your risk of certain diseases, such as type 2 diabetes and high blood pressure. Heart rate and blood pressure. Body temperature. Skin for abnormal spots. What immunizations do I need?  Vaccines are usually given at various ages, according to a schedule. Your health care provider will recommend vaccines for you based on your age, medical history, and lifestyle or other factors, such as travel or where you work. What tests do I need? Screening Your health care provider may recommend screening tests for certain conditions. This may include: Lipid and cholesterol levels. Hepatitis C test. Hepatitis B test. HIV (human immunodeficiency virus) test. STI (sexually transmitted infection) testing, if you are at  risk. Lung cancer screening. Colorectal cancer screening. Diabetes screening. This is done by checking your blood sugar (glucose) after you have not eaten for a while (fasting). Mammogram. Talk with your health care provider about how often you should have regular mammograms. BRCA-related cancer screening. This may be done if you have a family history of breast, ovarian, tubal, or peritoneal cancers. Bone density scan. This is done to screen for osteoporosis. Talk with your health care provider about your test results, treatment options, and if necessary, the need for more tests. Follow these instructions at home: Eating and drinking  Eat a diet that includes fresh fruits and vegetables, whole grains, lean protein, and low-fat dairy products. Limit your intake of foods with high amounts of sugar, saturated fats, and salt. Take vitamin and mineral supplements as recommended by your health care provider. Do not drink alcohol if your health care provider tells you not to drink. If you drink alcohol: Limit how much you have to 0-1 drink a day. Know how much alcohol is in your drink. In the [place] one drink equals one 12 oz bottle of beer (355 mL), one 5 oz glass of wine (148 mL), or one 1 oz glass of hard liquor (44 mL). Lifestyle Brush your teeth every morning and night with fluoride toothpaste. Floss one time each day. Exercise for at least 30 minutes 5 or more days each week. Do not use any products that contain nicotine or tobacco. These products include cigarettes, chewing tobacco, and vaping devices, such as e-cigarettes. If you need help quitting, ask your health care provider. Do not use drugs. If you are sexually active, practice safe sex. Use a condom or other form of protection in order to prevent STIs. Take aspirin only as told by  your health care provider. Make sure that you understand how much to take and what form to take. Work with your health care provider to find out whether it  is safe and beneficial for you to take aspirin daily. Ask your health care provider if you need to take a cholesterol-lowering medicine (statin). Find healthy ways to manage stress, such as: Meditation, yoga, or listening to music. Journaling. Talking to a trusted person. Spending time with friends and family. Minimize exposure to UV radiation to reduce your risk of skin cancer. Safety Always wear your seat belt while driving or riding in a vehicle. Do not drive: If you have been drinking alcohol. Do not ride with someone who has been drinking. When you are tired or distracted. While texting. If you have been using any mind-altering substances or drugs. Wear a helmet and other protective equipment during sports activities. If you have firearms in your house, make sure you follow all gun safety procedures. What's next? Visit your health care provider once a year for an annual wellness visit. Ask your health care provider how often you should have your eyes and teeth checked. Stay up to date on all vaccines. This information is not intended to replace advice given to you by your health care provider. Make sure you discuss any questions you have with your health care provider. Document Revised: 04/04/2021 Document Reviewed: 04/04/2021 Elsevier Patient Education  2023 [employer]  

## 2022-03-25 NOTE — Assessment & Plan Note (Signed)
ghm utd Check labs  See avs  

## 2022-03-25 NOTE — Assessment & Plan Note (Signed)
Well controlled, no changes to meds. Encouraged heart healthy diet such as the DASH diet and exercise as tolerated.  °

## 2022-03-25 NOTE — Assessment & Plan Note (Signed)
Tolerating statin, encouraged heart healthy diet, avoid trans fats, minimize simple carbs and saturated fats. Increase exercise as tolerated 

## 2022-03-25 NOTE — Progress Notes (Signed)
Subjective:   By signing my name below, I, Michelle Long, attest that this documentation has been prepared under the direction and in the presence of Michelle Long, 03/25/2022    Patient ID: Michelle Long, female    DOB: 12-12-1945, 76 y.o.   MRN: [id number]  Chief Complaint  Patient presents with   Annual Exam    Pt states fasting     HPI Patient is in today for a comprehensive physical exam.   She reports that she hasn't had acid reflux in at least a year. She also reports that she received an apple watch and was able to look at her steps, how long she's standing, heart rate and other additional health facts.  She states that she feels good about her blood sugar. She states that she struggles to keep up with taking 60 Mg of Starlix 3 times a day but reports that she at least takes the medication two times a day.  Lab Results  Component Value Date   HGBA1C 6.3 03/25/2022    As of today's visit, her blood pressure is normal. Her pulse is a slightly  elevated. She does not record her pulse at home.  BP Readings from Last 3 Encounters:  03/25/22 128/82  01/24/22 135/64  09/27/21 (!) 139/59   Pulse Readings from Last 3 Encounters:  03/25/22 100  01/24/22 90  09/27/21 (!) 110   She regularly sees her psychiatrist. She also regularly sees Dr. Gatha Long Michelle Long, and Michelle Long. She reports that she does not see MichelleButler or Dr. Jacqualyn Posey.   She reports that her asthma has been doing well. She is still receiving 160-4.5 MCG/ACT of Symbicort.   She denies having any fever, ear pain, new muscle pain, joint pain, new moles, congestion, sinus pain, sore throat, palpations, wheezing, n/v/d, constipation, blood in stool, dysuria, frequency, hematuria at this time.  Colonoscopy last completed on 01/15/2013 Dexa last completed on 08/14/2020 Mammogram last completed on 07/13/2021 She reports that she is UTD on the Shingles vaccine.  She is UTD on dental exams.  She is UTD on  vision exams. She reports that she sees her eye doctor every fall. She states that her vision has been good.   Past Medical History:  Diagnosis Date   Anxiety    Asthma    Bipolar disorder [facility])    Bladder cancer ([place])    Breast cancer ([place])    Cancer of lower-inner quadrant of left female breast ([place]) 12/08/2013   COPD (chronic obstructive pulmonary disease) ([place])    former smoker, quit 1999   Depression    Diabetes mellitus    GERD (gastroesophageal reflux disease)    Hepatitis B infection    History of transfusion of whole blood    Approximately 2009, due to breast cancer/chemo.   Hyperlipidemia    Hypertension    Personal history of chemotherapy    Personal history of radiation therapy    PONV (postoperative nausea and vomiting)     Past Surgical History:  Procedure Laterality Date   ABDOMINAL HYSTERECTOMY     APPENDECTOMY  1961   bladder cancer  2006   BREAST LUMPECTOMY Left 2007   BREAST LUMPECTOMY WITH RADIOACTIVE SEED LOCALIZATION Right 03/09/2021   Procedure: RIGHT BREAST LUMPECTOMY WITH RADIOACTIVE SEED LOCALIZATION;  Surgeon: Michelle Kussmaul, MD;  Location: [facility];  Service: General;  Laterality: Right;   [place]    Family History  Problem Relation Age of Onset   Arthritis Mother    Breast cancer Mother    Transient ischemic attack Mother    Hypertension Mother    Heart disease Father    Diabetes Father    Cancer Father        bladder cancer   Throat cancer Father    Breast cancer Sister     Social History   Socioeconomic History   Marital status: Divorced    Spouse name: Not on file   Number of children: Not on file   Years of education: Not on file   Highest education level: Not on file  Occupational History   Occupation: retired    Long: retired  Tobacco Use   Smoking status: Former    Packs/day: 1.50    Years: 33.00    Pack years: 49.50    Types: Cigarettes    Start date:  12/08/1964    Quit date: 08/21/1998    Years since quitting: 23.6   Smokeless tobacco: Never   Tobacco comments:    quit 16 years ago  Vaping Use   Vaping Use: Never used  Substance and Sexual Activity   Alcohol use: Not Currently    Alcohol/week: 0.0 standard drinks   Drug use: No   Sexual activity: Not Currently    Partners: Male    Birth control/protection: Surgical  Other Topics Concern   Not on file  Social History Narrative   Exercise--- walks with neighbor   Social Determinants of Health   Financial Resource Strain: Low Risk    Difficulty of Paying Living Expenses: Not hard at all  Food Insecurity: No Food Insecurity   Worried About Charity fundraiser in the Last Year: Never true   [place] in the Last Year: Never true  Transportation Needs: No Transportation Needs   Lack of Transportation (Medical): No   Lack of Transportation (Non-Medical): No  Physical Activity: Not on file  Stress: No Stress Concern Present   Feeling of Stress : Not at all  Social Connections: Moderately Integrated   Frequency of Communication with Friends and Family: More than three times a week   Frequency of Social Gatherings with Friends and Family: More than three times a week   Attends Religious Services: More than 4 times per year   Active Member of [employer] or Organizations: Yes   Attends Music therapist: More than 4 times per year   Marital Status: Divorced  Human resources officer Violence: Not At Risk   Fear of Current or Ex-Partner: No   Emotionally Abused: No   Physically Abused: No   Sexually Abused: No    Outpatient Medications Prior to Visit  Medication Sig Dispense Refill   albuterol (VENTOLIN HFA) 108 (90 Base) MCG/ACT inhaler INHALE 2 PUFFS INTO THE LUNGS EVERY 6 HOURS AS NEEDED FOR WHEEZING OR SHORTNESS OF BREATH 18 g 1   Alcohol Swabs (DROPSAFE ALCOHOL PREP) 70 % PADS USE  IN THE MORNING AND AT BEDTIME. 200 each 3   alendronate (FOSAMAX) 70 MG tablet TAKE 1  TABLET BY MOUTH ONCE A WEEK WITH A FULL GLASS OF WATER ON AN EMPTY STOMACH 12 tablet 3   aspirin 81 MG tablet Take 81 mg by mouth daily.     Blood Glucose Calibration (TRUE METRIX LEVEL 1) Low SOLN 1 each by In Vitro route as directed. E11.65 1 each EACH   Blood Glucose Monitoring Suppl (TRUE METRIX METER) w/Device KIT Check sugars  once daily as directed. E11.9 1 kit 0   budesonide-formoterol (SYMBICORT) 160-4.5 MCG/ACT inhaler Inhale 2 puffs into the lungs in the morning and at bedtime. ID# [id number] 30.6 g 1   Calcium Carb-Cholecalciferol (CALCIUM 1000 + D PO) Take by mouth 2 (two) times daily.     Cholecalciferol (VITAMIN D) 2000 units CAPS Take 2,000 Units by mouth daily.     citalopram (CELEXA) 10 MG tablet Take 0.5 tablets (5 mg total) by mouth at bedtime. 45 tablet 1   ezetimibe (ZETIA) 10 MG tablet TAKE 1 TABLET EVERY DAY 90 tablet 1   glucose blood (TRUE METRIX BLOOD GLUCOSE TEST) test strip TEST BLOOD SUGAR EVERY DAY 100 strip 12   letrozole (FEMARA) 2.5 MG tablet TAKE 1 TABLET EVERY DAY 90 tablet 3   LORazepam (ATIVAN) 0.5 MG tablet Take 1 tablet (0.5 mg total) by mouth 2 (two) times daily as needed. 90 tablet 0   losartan (COZAAR) 50 MG tablet TAKE 1 TABLET EVERY DAY 90 tablet 1   metFORMIN (GLUCOPHAGE) 1000 MG tablet Take 1 tablet (1,000 mg total) by mouth 2 (two) times daily with a meal. 180 tablet 1   Multiple Vitamin (MULTIVITAMIN) tablet Take 1 tablet by mouth daily.     Multiple Vitamins-Minerals (HAIR SKIN AND NAILS FORMULA) TABS Take 1 tablet by mouth daily. 2,500 mg daily     nateglinide (STARLIX) 60 MG tablet TAKE 1 TABLET THREE TIMES DAILY WITH MEALS 270 tablet 1   OLANZapine (ZYPREXA) 5 MG tablet 1/2 tablet daily (Patient taking differently: 5 mg daily.) 45 tablet 1   pantoprazole (PROTONIX) 40 MG tablet TAKE 1 TABLET EVERY DAY 90 tablet 1   rosuvastatin (CRESTOR) 5 MG tablet TAKE 1 TABLET THREE TIMES WEEKLY 39 tablet 2   Spacer/Aero Chamber Mouthpiece MISC To use with  inhaler 1 each 1   TRUEplus Lancets 33G MISC TEST BLOOD SUGAR EVERY DAY 100 each 1   No facility-administered medications prior to visit.    Allergies  Allergen Reactions   Fluoxetine Other (See Comments)    seizure   Fluoxetine Hcl Other (See Comments)    seizure   Lamotrigine Itching   Sulfa Antibiotics Shortness Of Breath   Sulfa Antibiotics    Sulfa Drugs Cross Reactors Shortness Of Breath        Statins Other (See Comments)    Muscle pain    Review of Systems  Constitutional:  Negative for fever.  HENT:  Negative for congestion, ear pain, sinus pain and sore throat.   Respiratory:  Negative for wheezing.   Cardiovascular:  Negative for palpitations.  Gastrointestinal:  Negative for blood in stool, constipation, diarrhea, nausea and vomiting.  Genitourinary:  Negative for dysuria, frequency and hematuria.  Musculoskeletal:  Negative for joint pain and myalgias.  Skin:        (-) New Moles      Objective:    Physical Exam Constitutional:      General: She is not in acute distress.    Appearance: Normal appearance. She is not ill-appearing.  HENT:     Head: Normocephalic and atraumatic.     Right Ear: Tympanic membrane, ear canal and external ear normal.     Left Ear: Tympanic membrane, ear canal and external ear normal.  Eyes:     Extraocular Movements: Extraocular movements intact.     Pupils: Pupils are equal, round, and reactive to light.  Cardiovascular:     Rate and Rhythm: Normal rate and regular rhythm.  Pulses:          Dorsalis pedis pulses are 2+ on the right side and 2+ on the left side.       Posterior tibial pulses are 2+ on the right side and 2+ on the left side.     Heart sounds: Normal heart sounds. No murmur heard.   No gallop.  Pulmonary:     Effort: Pulmonary effort is normal. No respiratory distress.     Breath sounds: Normal breath sounds. No wheezing or rales.  Abdominal:     General: Bowel sounds are normal. There is no  distension.     Palpations: Abdomen is soft.     Tenderness: There is no abdominal tenderness. There is no guarding.  Skin:    General: Skin is warm and dry.  Neurological:     Mental Status: She is alert and oriented to person, place, and time.  Psychiatric:        Judgment: Judgment normal.   Diabetic Foot Exam - Simple   Simple Foot Form Diabetic Foot exam was performed with the following findings: Yes 03/25/2022 11:04 AM  Visual Inspection No deformities, no ulcerations, no other skin breakdown bilaterally: Yes Sensation Testing Intact to touch and monofilament testing bilaterally: Yes Pulse Check Posterior Tibialis and Dorsalis pulse intact bilaterally: Yes Comments      BP 128/82 (BP Location: Right Arm, Patient Position: Sitting, Cuff Size: Normal)   Pulse 100   Temp 98.4 F (36.9 C) (Oral)   Resp 18   Ht '5\' 4"'  (1.626 m)   Wt 151 lb (68.5 kg)   SpO2 95%   BMI 25.92 kg/m  Wt Readings from Last 3 Encounters:  03/25/22 151 lb (68.5 kg)  01/24/22 151 lb (68.5 kg)  10/01/21 155 lb (70.3 kg)    Diabetic Foot Exam - Simple   Simple Foot Form Diabetic Foot exam was performed with the following findings: Yes 03/25/2022 11:04 AM  Visual Inspection No deformities, no ulcerations, no other skin breakdown bilaterally: Yes Sensation Testing Intact to touch and monofilament testing bilaterally: Yes Pulse Check Posterior Tibialis and Dorsalis pulse intact bilaterally: Yes Comments    Lab Results  Component Value Date   WBC 6.1 03/25/2022   HGB 13.3 03/25/2022   HCT 40.7 03/25/2022   PLT 176.0 03/25/2022   GLUCOSE 106 (H) 03/25/2022   CHOL 136 03/25/2022   TRIG 120.0 03/25/2022   HDL 51.00 03/25/2022   LDLDIRECT 145.6 06/22/2013   LDLCALC 61 03/25/2022   ALT 33 03/25/2022   AST 31 03/25/2022   NA 140 03/25/2022   K 4.8 03/25/2022   CL 102 03/25/2022   CREATININE 0.86 03/25/2022   BUN 12 03/25/2022   CO2 29 03/25/2022   TSH 0.80 01/30/2015   INR 0.97  09/25/2011   HGBA1C 6.3 03/25/2022   MICROALBUR 1.5 06/26/2021    Lab Results  Component Value Date   TSH 0.80 01/30/2015   Lab Results  Component Value Date   WBC 6.1 03/25/2022   HGB 13.3 03/25/2022   HCT 40.7 03/25/2022   MCV 89.9 03/25/2022   PLT 176.0 03/25/2022   Lab Results  Component Value Date   NA 140 03/25/2022   K 4.8 03/25/2022   CHLORIDE 103 01/01/2017   CO2 29 03/25/2022   GLUCOSE 106 (H) 03/25/2022   BUN 12 03/25/2022   CREATININE 0.86 03/25/2022   BILITOT 0.4 03/25/2022   ALKPHOS 33 (L) 03/25/2022   AST 31 03/25/2022   ALT 33  03/25/2022   PROT 6.8 03/25/2022   ALBUMIN 4.4 03/25/2022   CALCIUM 10.0 03/25/2022   ANIONGAP 11 01/24/2022   EGFR 70 (L) 01/01/2017   GFR 65.83 03/25/2022   Lab Results  Component Value Date   CHOL 136 03/25/2022   Lab Results  Component Value Date   HDL 51.00 03/25/2022   Lab Results  Component Value Date   LDLCALC 61 03/25/2022   Lab Results  Component Value Date   TRIG 120.0 03/25/2022   Lab Results  Component Value Date   CHOLHDL 3 03/25/2022   Lab Results  Component Value Date   HGBA1C 6.3 03/25/2022       Assessment & Plan:   Problem List Items Addressed This Visit       Unprioritized   Uncontrolled type 2 diabetes mellitus with hyperglycemia ([place])    hgba1c to be checked, minimize simple carbs. Increase exercise as tolerated. Continue current meds        Preventative health care - Primary    ghm utd Check labs  See avs       Hyperlipidemia associated with type 2 diabetes mellitus ([place])    Tolerating statin, encouraged heart healthy diet, avoid trans fats, minimize simple carbs and saturated fats. Increase exercise as tolerated       Relevant Orders   CBC with Differential/Platelet (Completed)   Comprehensive metabolic panel (Completed)   Hemoglobin A1c (Completed)   Lipid panel (Completed)   Microalbumin / creatinine urine ratio   Essential hypertension    Well controlled, no  changes to meds. Encouraged heart healthy diet such as the DASH diet and exercise as tolerated.        Other Visit Diagnoses     Type 2 diabetes mellitus with hyperglycemia, without long-term current use of insulin ([place])       Relevant Orders   CBC with Differential/Platelet (Completed)   Comprehensive metabolic panel (Completed)   Hemoglobin A1c (Completed)   Lipid panel (Completed)   Microalbumin / creatinine urine ratio   Primary hypertension       Relevant Orders   CBC with Differential/Platelet (Completed)   Comprehensive metabolic panel (Completed)   Hemoglobin A1c (Completed)   Lipid panel (Completed)   Microalbumin / creatinine urine ratio      No orders of the defined types were placed in this encounter.   IAnn Held, Long, personally preformed the services described in this documentation.  All medical record entries made by the scribe were at my direction and in my presence.  I have reviewed the chart and discharge instructions (if applicable) and agree that the record reflects my personal performance and is accurate and complete. 03/25/2022   I,Amber Collins,acting as a scribe for Ann Held, Long.,have documented all relevant documentation on the behalf of Ann Held, Long,as directed by  Ann Held, Long while in the presence of Ann Held, Long.   Ann Held, Long

## 2022-03-25 NOTE — Assessment & Plan Note (Signed)
hgba1c to be checked, minimize simple carbs. Increase exercise as tolerated. Continue current meds  

## 2022-04-05 ENCOUNTER — Encounter: Payer: Self-pay | Admitting: Family Medicine

## 2022-04-10 DIAGNOSIS — F250 Schizoaffective disorder, bipolar type: Secondary | ICD-10-CM | POA: Diagnosis not present

## 2022-05-01 ENCOUNTER — Encounter: Payer: Self-pay | Admitting: Family Medicine

## 2022-05-01 DIAGNOSIS — J4520 Mild intermittent asthma, uncomplicated: Secondary | ICD-10-CM

## 2022-05-01 NOTE — Telephone Encounter (Signed)
Medication not on pt's med list

## 2022-05-03 MED ORDER — BUDESONIDE-FORMOTEROL FUMARATE 160-4.5 MCG/ACT IN AERO: 2.0000 | INHALATION_SPRAY | Freq: Two times a day (BID) | RESPIRATORY_TRACT | 2 refills | Status: DC

## 2022-05-04 ENCOUNTER — Other Ambulatory Visit: Payer: Self-pay | Admitting: Family Medicine

## 2022-05-22 ENCOUNTER — Other Ambulatory Visit: Payer: Self-pay | Admitting: Family Medicine

## 2022-05-29 DIAGNOSIS — D1801 Hemangioma of skin and subcutaneous tissue: Secondary | ICD-10-CM | POA: Diagnosis not present

## 2022-05-29 DIAGNOSIS — L814 Other melanin hyperpigmentation: Secondary | ICD-10-CM | POA: Diagnosis not present

## 2022-05-29 DIAGNOSIS — X32XXXS Exposure to sunlight, sequela: Secondary | ICD-10-CM | POA: Diagnosis not present

## 2022-05-29 DIAGNOSIS — L821 Other seborrheic keratosis: Secondary | ICD-10-CM | POA: Diagnosis not present

## 2022-05-31 ENCOUNTER — Other Ambulatory Visit: Payer: Self-pay | Admitting: Hematology & Oncology

## 2022-05-31 DIAGNOSIS — Z1231 Encounter for screening mammogram for malignant neoplasm of breast: Secondary | ICD-10-CM

## 2022-06-05 ENCOUNTER — Encounter: Payer: Self-pay | Admitting: Family Medicine

## 2022-06-05 ENCOUNTER — Ambulatory Visit (INDEPENDENT_AMBULATORY_CARE_PROVIDER_SITE_OTHER): Payer: Medicare HMO | Admitting: Family Medicine

## 2022-06-05 VITALS — BP 120/69 | HR 109 | Ht 64.0 in | Wt 151.8 lb

## 2022-06-05 DIAGNOSIS — H6123 Impacted cerumen, bilateral: Secondary | ICD-10-CM | POA: Diagnosis not present

## 2022-06-05 NOTE — Progress Notes (Signed)
   Acute Office Visit  Subjective:     Patient ID: Michelle Long, female    DOB: 07-05-1946, 76 y.o.   MRN: [id number]  CC: cerumen impaction   HPI Patient is in today for "ears clogged."  States she has felt like her left ear is clogged for the past several days and has had some muffled hearing. She has tried over-the-counter irrigation system without any improvement. She doesn't have any major right sided symptoms, but might feel slightly clogged as well. She denies any pain, hearing loss, fevers ,chills, URI symptoms.     ROS All review of systems negative except what is listed in the HPI      Objective:    BP 120/69   Pulse (!) 109   Ht '5\' 4"'$  (1.626 m)   Wt 151 lb 12.8 oz (68.9 kg)   BMI 26.06 kg/m    Physical Exam Vitals reviewed.  Constitutional:      Appearance: Normal appearance.  HENT:     Right Ear: There is impacted cerumen.     Left Ear: There is impacted cerumen.  Skin:    General: Skin is warm and dry.  Neurological:     General: No focal deficit present.     Mental Status: She is alert and oriented to person, place, and time. Mental status is at baseline.  Psychiatric:        Mood and Affect: Mood normal.        Behavior: Behavior normal.        Thought Content: Thought content normal.        Judgment: Judgment normal.     No results found for any visits on 06/05/22.      Assessment & Plan:   1. Bilateral impacted cerumen  Indication: Cerumen impaction of the ear(s)  Medical necessity statement: On physical examination, cerumen impairs clinically significant portions of the external auditory canal, and tympanic membrane. Noted obstructive, copious cerumen that cannot be removed without magnification and instrumentations requiring physician skills Consent: Discussed benefits and risks of procedure and verbal consent obtained Procedure: Patient was prepped for the procedure. Utilized an otoscope to assess and take note of the ear canal, the  tympanic membrane, and the presence, amount, and placement of the cerumen. Gentle water irrigation and soft plastic curette was utilized to remove cerumen.  Post procedure examination: shows cerumen was completely removed. Patient tolerated procedure well. The patient is made aware that they may experience temporary vertigo, temporary hearing loss, and temporary discomfort. If these symptom last for more than 24 hours to call the clinic or proceed to the ED.    Return if symptoms worsen or fail to improve.  Terrilyn Saver, NP

## 2022-06-11 ENCOUNTER — Other Ambulatory Visit: Payer: Self-pay | Admitting: Family Medicine

## 2022-06-26 ENCOUNTER — Inpatient Hospital Stay: Payer: Medicare HMO | Admitting: Hematology & Oncology

## 2022-06-26 ENCOUNTER — Encounter: Payer: Self-pay | Admitting: Hematology & Oncology

## 2022-06-26 ENCOUNTER — Inpatient Hospital Stay: Payer: Medicare HMO | Attending: Family Medicine

## 2022-06-26 ENCOUNTER — Other Ambulatory Visit: Payer: Self-pay

## 2022-06-26 VITALS — BP 136/56 | HR 73 | Temp 97.9°F | Resp 18 | Wt 151.0 lb

## 2022-06-26 DIAGNOSIS — Z853 Personal history of malignant neoplasm of breast: Secondary | ICD-10-CM | POA: Diagnosis not present

## 2022-06-26 DIAGNOSIS — N62 Hypertrophy of breast: Secondary | ICD-10-CM | POA: Diagnosis not present

## 2022-06-26 DIAGNOSIS — Z79811 Long term (current) use of aromatase inhibitors: Secondary | ICD-10-CM | POA: Insufficient documentation

## 2022-06-26 DIAGNOSIS — C50312 Malignant neoplasm of lower-inner quadrant of left female breast: Secondary | ICD-10-CM

## 2022-06-26 LAB — CBC WITH DIFFERENTIAL (CANCER CENTER ONLY)
Abs Immature Granulocytes: 0.02 10*3/uL (ref 0.00–0.07)
Basophils Absolute: 0 10*3/uL (ref 0.0–0.1)
Basophils Relative: 1 %
Eosinophils Absolute: 0.1 10*3/uL (ref 0.0–0.5)
Eosinophils Relative: 2 %
HCT: 41.6 % (ref 36.0–46.0)
Hemoglobin: 13.1 g/dL (ref 12.0–15.0)
Immature Granulocytes: 0 %
Lymphocytes Relative: 28 %
Lymphs Abs: 2 10*3/uL (ref 0.7–4.0)
MCH: 28.8 pg (ref 26.0–34.0)
MCHC: 31.5 g/dL (ref 30.0–36.0)
MCV: 91.4 fL (ref 80.0–100.0)
Monocytes Absolute: 0.7 10*3/uL (ref 0.1–1.0)
Monocytes Relative: 10 %
Neutro Abs: 4.3 10*3/uL (ref 1.7–7.7)
Neutrophils Relative %: 59 %
Platelet Count: 200 10*3/uL (ref 150–400)
RBC: 4.55 MIL/uL (ref 3.87–5.11)
RDW: 13.8 % (ref 11.5–15.5)
WBC Count: 7.2 10*3/uL (ref 4.0–10.5)
nRBC: 0 % (ref 0.0–0.2)

## 2022-06-26 LAB — CMP (CANCER CENTER ONLY)
ALT: 31 U/L (ref 0–44)
AST: 32 U/L (ref 15–41)
Albumin: 4.6 g/dL (ref 3.5–5.0)
Alkaline Phosphatase: 33 U/L — ABNORMAL LOW (ref 38–126)
Anion gap: 9 (ref 5–15)
BUN: 16 mg/dL (ref 8–23)
CO2: 27 mmol/L (ref 22–32)
Calcium: 10.2 mg/dL (ref 8.9–10.3)
Chloride: 103 mmol/L (ref 98–111)
Creatinine: 0.91 mg/dL (ref 0.44–1.00)
GFR, Estimated: >60 mL/min (ref >60)
Glucose, Bld: 100 mg/dL — ABNORMAL HIGH (ref 70–99)
Potassium: 4.5 mmol/L (ref 3.5–5.1)
Sodium: 139 mmol/L (ref 135–145)
Total Bilirubin: 0.2 mg/dL — ABNORMAL LOW (ref 0.3–1.2)
Total Protein: 7 g/dL (ref 6.5–8.1)

## 2022-06-26 LAB — LACTATE DEHYDROGENASE: LDH: 163 U/L (ref 98–192)

## 2022-06-26 NOTE — Progress Notes (Signed)
Hematology and Oncology Follow Up Visit  Michelle Long [id number] 09/02/1946 76 y.o. 06/26/2022   Principle Diagnosis:  Stage IIA (T2 N0M0) infiltrating ductal carcinoma of the left breast Usual Ductal Hyperplasia -- Complex sclerosing    Current Therapy:   Tamoxifen 20 mg by mouth daily - finished in September 2017 Femara 2.5 mg p.o. daily --started on 04/02/2021      Interim History:  Michelle Long is here for follow-up.  So far, she is doing pretty well.  She really has had no complaints although she does have a subluxation of the bones on the thumb on the left hand.  So that she may have to go to her orthopedist to have this looked at.  She has had no other problems.  She is on the Femara.  She is doing well on the Femara.  I do not think anything with the hand is related to the Femara.  She is watching her blood sugars closely.  Today, her blood sugar is doing quite nicely.  She has had no change in bowel or bladder habits.  She has had no nausea or vomiting.  She has had no cough or shortness of breath.  Thankfully, there is been no issues with COVID.  Currently, I would say performance status is probably ECOG 1.   Medications:  Allergies as of 06/26/2022       Reactions   Fluoxetine Other (See Comments)   seizure   Fluoxetine Hcl Other (See Comments)   seizure   Lamotrigine Itching   Sulfa Antibiotics Shortness Of Breath   Sulfa Antibiotics    Sulfa Drugs Cross Reactors Shortness Of Breath      Statins Other (See Comments)   Muscle pain        Medication List        Accurate as of June 26, 2022  4:03 PM. If you have any questions, ask your nurse or doctor.          albuterol 108 (90 Base) MCG/ACT inhaler Commonly known as: VENTOLIN HFA INHALE 2 PUFFS INTO THE LUNGS EVERY 6 HOURS AS NEEDED FOR WHEEZING OR SHORTNESS OF BREATH   alendronate 70 MG tablet Commonly known as: FOSAMAX TAKE 1 TABLET BY MOUTH ONCE A WEEK WITH A FULL GLASS OF WATER ON AN EMPTY  STOMACH   aspirin 81 MG tablet Take 81 mg by mouth daily.   budesonide-formoterol 160-4.5 MCG/ACT inhaler Commonly known as: Symbicort Inhale 2 puffs into the lungs in the morning and at bedtime. ID# [id number]   CALCIUM 1000 + D PO Take by mouth 2 (two) times daily.   citalopram 10 MG tablet Commonly known as: CELEXA Take 0.5 tablets (5 mg total) by mouth at bedtime.   DropSafe Alcohol Prep 70 % Pads USE  IN THE MORNING AND AT BEDTIME.   ezetimibe 10 MG tablet Commonly known as: ZETIA TAKE 1 TABLET EVERY DAY   Hair Skin and Nails Formula Tabs Take 1 tablet by mouth daily. 2,500 mg daily   letrozole 2.5 MG tablet Commonly known as: FEMARA TAKE 1 TABLET EVERY DAY   LORazepam 0.5 MG tablet Commonly known as: ATIVAN Take 1 tablet (0.5 mg total) by mouth 2 (two) times daily as needed.   LORazepam 1 MG tablet Commonly known as: ATIVAN Take 1 mg by mouth daily as needed.   losartan 50 MG tablet Commonly known as: COZAAR TAKE 1 TABLET EVERY DAY   metFORMIN 1000 MG tablet Commonly known as: GLUCOPHAGE TAKE 1  TABLET TWICE DAILY WITH MEALS   multivitamin tablet Take 1 tablet by mouth daily.   nateglinide 60 MG tablet Commonly known as: STARLIX TAKE 1 TABLET THREE TIMES DAILY WITH MEALS   OLANZapine 5 MG tablet Commonly known as: ZYPREXA 1/2 tablet daily What changed:  how much to take when to take this additional instructions   pantoprazole 40 MG tablet Commonly known as: PROTONIX TAKE 1 TABLET EVERY DAY   rosuvastatin 5 MG tablet Commonly known as: CRESTOR TAKE 1 TABLET THREE TIMES WEEKLY   Spacer/Aero Chamber Mouthpiece Misc To use with inhaler   True Metrix Blood Glucose Test test strip Generic drug: glucose blood TEST BLOOD SUGAR EVERY DAY   True Metrix Level 1 Low Soln 1 each by In Vitro route as directed. E11.65   True Metrix Meter w/Device Kit Check sugars once daily as directed. E11.9   TRUEplus Lancets 33G Misc TEST BLOOD SUGAR EVERY  DAY   Vitamin D 50 MCG (2000 UT) Caps Take 2,000 Units by mouth daily.        Allergies:  Allergies  Allergen Reactions   Fluoxetine Other (See Comments)    seizure   Fluoxetine Hcl Other (See Comments)    seizure   Lamotrigine Itching   Sulfa Antibiotics Shortness Of Breath   Sulfa Antibiotics    Sulfa Drugs Cross Reactors Shortness Of Breath        Statins Other (See Comments)    Muscle pain    Past Medical History, Surgical history, Social history, and Family History were reviewed and updated.  Review of Systems: Review of Systems  Constitutional: Negative.   HENT: Negative.    Eyes: Negative.   Respiratory: Negative.    Cardiovascular: Negative.   Gastrointestinal: Negative.   Genitourinary: Negative.   Musculoskeletal: Negative.   Skin: Negative.   Neurological: Negative.   Endo/Heme/Allergies: Negative.   Psychiatric/Behavioral: Negative.       Physical Exam:  weight is 151 lb (68.5 kg). Her oral temperature is 97.9 F (36.6 C). Her blood pressure is 136/56 (abnormal) and her pulse is 73. Her respiration is 18 and oxygen saturation is 96%.   Wt Readings from Last 3 Encounters:  06/26/22 151 lb (68.5 kg)  06/05/22 151 lb 12.8 oz (68.9 kg)  03/25/22 151 lb (68.5 kg)    Physical Exam Vitals reviewed.  Constitutional:      Comments: Her breast exam shows her right breast with no masses, edema or erythema. There is no right axillary adenopathy. Left breast is somewhat contracted from surgery and radiation. She has a well-healed lumpectomy scar at the 6:00 position. There is some slight firmness at the lumpectomy site. There is no distinct mass. There is no left axillary adenopathy.  HENT:     Head: Normocephalic and atraumatic.  Eyes:     Pupils: Pupils are equal, round, and reactive to light.  Cardiovascular:     Rate and Rhythm: Normal rate and regular rhythm.     Heart sounds: Normal heart sounds.  Pulmonary:     Effort: Pulmonary effort is  normal.     Breath sounds: Normal breath sounds.  Abdominal:     General: Bowel sounds are normal.     Palpations: Abdomen is soft.  Musculoskeletal:        General: No tenderness or deformity. Normal range of motion.     Cervical back: Normal range of motion.  Lymphadenopathy:     Cervical: No cervical adenopathy.  Skin:    General:  Skin is warm and dry.     Findings: No erythema or rash.  Neurological:     Mental Status: She is alert and oriented to person, place, and time.  Psychiatric:        Behavior: Behavior normal.        Thought Content: Thought content normal.        Judgment: Judgment normal.    Lab Results  Component Value Date   WBC 7.2 06/26/2022   HGB 13.1 06/26/2022   HCT 41.6 06/26/2022   MCV 91.4 06/26/2022   PLT 200 06/26/2022   No results found for: "FERRITIN", "IRON", "TIBC", "UIBC", "IRONPCTSAT" Lab Results  Component Value Date   RBC 4.55 06/26/2022   No results found for: "KPAFRELGTCHN", "LAMBDASER", "KAPLAMBRATIO" No results found for: "IGGSERUM", "IGA", "IGMSERUM" No results found for: "TOTALPROTELP", "ALBUMINELP", "A1GS", "A2GS", "BETS", "BETA2SER", "GAMS", "MSPIKE", "SPEI"   Chemistry      Component Value Date/Time   NA 139 06/26/2022 1508   NA 139 07/02/2017 1445   NA 142 01/01/2017 1059   K 4.5 06/26/2022 1508   K 4.6 07/02/2017 1445   K 4.6 01/01/2017 1059   CL 103 06/26/2022 1508   CL 103 07/02/2017 1445   CL 105 01/20/2015 1240   CO2 27 06/26/2022 1508   CO2 28 07/02/2017 1445   CO2 26 01/01/2017 1059   BUN 16 06/26/2022 1508   BUN 10 07/02/2017 1445   BUN 10.6 01/01/2017 1059   CREATININE 0.91 06/26/2022 1508   CREATININE 0.68 07/17/2020 1105   CREATININE 0.8 01/01/2017 1059      Component Value Date/Time   CALCIUM 10.2 06/26/2022 1508   CALCIUM 10.3 07/02/2017 1445   CALCIUM 10.1 01/01/2017 1059   ALKPHOS 33 (L) 06/26/2022 1508   ALKPHOS 47 07/02/2017 1445   ALKPHOS 49 01/01/2017 1059   AST 32 06/26/2022 1508   AST  60 (H) 01/01/2017 1059   ALT 31 06/26/2022 1508   ALT 63 (H) 01/01/2017 1059   BILITOT 0.2 (L) 06/26/2022 1508   BILITOT 0.64 01/01/2017 1059     Impression and Plan: Michelle Long is a most delightful 76 year old postmenopausal white female. She had stage IIA ductal carcinoma the left breast. She completed tamoxifen in September 2017.  Everything looks fine from my point of view.  I do not see anything that looks suspicious.  She continues on the Femara.  Again, if she needs to have any count of surgery for the left thumb, I really do not see a problem with this.  We will plan to get her back in another 6 months.  I think this would be reasonable.  We will get her through the holidays and hopefully most of Winter.     Volanda Napoleon, MD 9/6/20234:03 PM

## 2022-07-03 ENCOUNTER — Other Ambulatory Visit: Payer: Self-pay | Admitting: Family Medicine

## 2022-07-03 DIAGNOSIS — E1165 Type 2 diabetes mellitus with hyperglycemia: Secondary | ICD-10-CM

## 2022-07-15 ENCOUNTER — Ambulatory Visit: Payer: Medicare HMO

## 2022-07-18 ENCOUNTER — Ambulatory Visit
Admission: RE | Admit: 2022-07-18 | Discharge: 2022-07-18 | Disposition: A | Discharge status: Home / Self Care | Payer: Medicare HMO | Source: Ambulatory Visit | Attending: Hematology & Oncology | Admitting: Hematology & Oncology

## 2022-07-18 DIAGNOSIS — Z1231 Encounter for screening mammogram for malignant neoplasm of breast: Secondary | ICD-10-CM | POA: Diagnosis not present

## 2022-07-22 ENCOUNTER — Other Ambulatory Visit: Payer: Self-pay | Admitting: Hematology & Oncology

## 2022-07-22 DIAGNOSIS — R928 Other abnormal and inconclusive findings on diagnostic imaging of breast: Secondary | ICD-10-CM

## 2022-07-24 ENCOUNTER — Telehealth: Payer: Self-pay | Admitting: Family Medicine

## 2022-07-24 NOTE — Telephone Encounter (Signed)
Patient states she's getting a lump on her mouth removed, but she is scared that the Fosamax might cause a reaction. Pt states that she has stopped taking it prior to her procedure but she would like to stop taking it all together. Please advise.

## 2022-07-25 DIAGNOSIS — M272 Inflammatory conditions of jaws: Secondary | ICD-10-CM | POA: Diagnosis not present

## 2022-07-26 NOTE — Telephone Encounter (Signed)
How long after the procedure can she start the medication again?

## 2022-07-29 NOTE — Telephone Encounter (Signed)
Patient notified

## 2022-08-02 ENCOUNTER — Other Ambulatory Visit: Payer: Self-pay | Admitting: *Deleted

## 2022-08-02 MED ORDER — TRUE METRIX AIR GLUCOSE METER W/DEVICE KIT: PACK | 0 refills | Status: DC

## 2022-08-03 ENCOUNTER — Ambulatory Visit: Payer: Medicare HMO

## 2022-08-03 ENCOUNTER — Ambulatory Visit
Admission: RE | Admit: 2022-08-03 | Discharge: 2022-08-03 | Disposition: A | Discharge status: Home / Self Care | Payer: Medicare HMO | Source: Ambulatory Visit | Attending: Hematology & Oncology | Admitting: Hematology & Oncology

## 2022-08-03 ENCOUNTER — Other Ambulatory Visit: Payer: Self-pay | Admitting: Hematology & Oncology

## 2022-08-03 DIAGNOSIS — R920 Mammographic microcalcification found on diagnostic imaging of breast: Secondary | ICD-10-CM | POA: Diagnosis not present

## 2022-08-03 DIAGNOSIS — R928 Other abnormal and inconclusive findings on diagnostic imaging of breast: Secondary | ICD-10-CM

## 2022-08-03 DIAGNOSIS — R921 Mammographic calcification found on diagnostic imaging of breast: Secondary | ICD-10-CM

## 2022-08-06 ENCOUNTER — Other Ambulatory Visit: Payer: Medicare HMO

## 2022-08-14 ENCOUNTER — Telehealth: Payer: Self-pay | Admitting: Family Medicine

## 2022-08-14 NOTE — Telephone Encounter (Signed)
Spoke w/ Pt- recommend to proceed with RSV vaccine.

## 2022-08-14 NOTE — Telephone Encounter (Signed)
Patient wants to know if Dr. Etter Sjogren recommends that she get the RSV vaccine. Please call her to advise.

## 2022-09-14 ENCOUNTER — Other Ambulatory Visit: Payer: Self-pay | Admitting: Family Medicine

## 2022-09-14 DIAGNOSIS — J4520 Mild intermittent asthma, uncomplicated: Secondary | ICD-10-CM

## 2022-09-24 ENCOUNTER — Encounter: Payer: Self-pay | Admitting: Family Medicine

## 2022-09-24 ENCOUNTER — Ambulatory Visit (INDEPENDENT_AMBULATORY_CARE_PROVIDER_SITE_OTHER): Payer: Medicare HMO | Admitting: Family Medicine

## 2022-09-24 VITALS — BP 124/84 | HR 106 | Temp 97.6°F | Resp 18 | Ht 64.0 in | Wt 148.2 lb

## 2022-09-24 DIAGNOSIS — E1165 Type 2 diabetes mellitus with hyperglycemia: Secondary | ICD-10-CM

## 2022-09-24 DIAGNOSIS — E1169 Type 2 diabetes mellitus with other specified complication: Secondary | ICD-10-CM

## 2022-09-24 DIAGNOSIS — I10 Essential (primary) hypertension: Secondary | ICD-10-CM

## 2022-09-24 DIAGNOSIS — E785 Hyperlipidemia, unspecified: Secondary | ICD-10-CM

## 2022-09-24 LAB — CBC WITH DIFFERENTIAL/PLATELET
Basophils Absolute: 0.1 10*3/uL (ref 0.0–0.1)
Basophils Relative: 1.1 % (ref 0.0–3.0)
Eosinophils Absolute: 0.1 10*3/uL (ref 0.0–0.7)
Eosinophils Relative: 2.3 % (ref 0.0–5.0)
HCT: 42.5 % (ref 36.0–46.0)
Hemoglobin: 13.8 g/dL (ref 12.0–15.0)
Lymphocytes Relative: 23.8 % (ref 12.0–46.0)
Lymphs Abs: 1.5 10*3/uL (ref 0.7–4.0)
MCHC: 32.5 g/dL (ref 30.0–36.0)
MCV: 90.1 fl (ref 78.0–100.0)
Monocytes Absolute: 0.5 10*3/uL (ref 0.1–1.0)
Monocytes Relative: 7.4 % (ref 3.0–12.0)
Neutro Abs: 4.2 10*3/uL (ref 1.4–7.7)
Neutrophils Relative %: 65.4 % (ref 43.0–77.0)
Platelets: 229 10*3/uL (ref 150.0–400.0)
RBC: 4.72 Mil/uL (ref 3.87–5.11)
RDW: 14.5 % (ref 11.5–15.5)
WBC: 6.4 10*3/uL (ref 4.0–10.5)

## 2022-09-24 LAB — LIPID PANEL
Cholesterol: 148 mg/dL (ref 0–200)
HDL: 51.7 mg/dL (ref >39.00)
LDL Cholesterol: 70 mg/dL (ref 0–99)
NonHDL: 96.42
Total CHOL/HDL Ratio: 3
Triglycerides: 134 mg/dL (ref 0.0–149.0)
VLDL: 26.8 mg/dL (ref 0.0–40.0)

## 2022-09-24 LAB — COMPREHENSIVE METABOLIC PANEL
ALT: 35 U/L (ref 0–35)
AST: 30 U/L (ref 0–37)
Albumin: 4.4 g/dL (ref 3.5–5.2)
Alkaline Phosphatase: 37 U/L — ABNORMAL LOW (ref 39–117)
BUN: 11 mg/dL (ref 6–23)
CO2: 30 mEq/L (ref 19–32)
Calcium: 9.7 mg/dL (ref 8.4–10.5)
Chloride: 100 mEq/L (ref 96–112)
Creatinine, Ser: 0.73 mg/dL (ref 0.40–1.20)
GFR: 79.86 mL/min (ref >60.00)
Glucose, Bld: 127 mg/dL — ABNORMAL HIGH (ref 70–99)
Potassium: 4.6 mEq/L (ref 3.5–5.1)
Sodium: 139 mEq/L (ref 135–145)
Total Bilirubin: 0.3 mg/dL (ref 0.2–1.2)
Total Protein: 6.9 g/dL (ref 6.0–8.3)

## 2022-09-24 LAB — HEMOGLOBIN A1C: Hgb A1c MFr Bld: 6.7 % — ABNORMAL HIGH (ref 4.6–6.5)

## 2022-09-24 NOTE — Progress Notes (Signed)
Subjective:   By signing my name below, I, Shehryar Baig, attest that this documentation has been prepared under the direction and in the presence of Dr. Roma Schanz, DO. 09/24/2022     Patient ID: Michelle Long, female    DOB: May 24, 1946, 76 y.o.   MRN: [id number]  Chief Complaint  Patient presents with   Diabetes   Hypertension   Hyperlipidemia   Follow-up    Diabetes Pertinent negatives for hypoglycemia include no dizziness, headaches or nervousness/anxiousness. Pertinent negatives for diabetes include no chest pain and no weakness.  Hypertension Pertinent negatives include no chest pain, headaches, malaise/fatigue, palpitations or shortness of breath.  Hyperlipidemia Pertinent negatives include no chest pain, myalgias or shortness of breath.   Patient is in today for a follow up visit.   Her blood pressure is doing well during this visit. She continues taking 50 mg losartan daily PO and reports no new issues while taking it. She denies having any swelling in her legs.  BP Readings from Last 3 Encounters:  09/24/22 124/84  06/26/22 (!) 136/56  06/05/22 120/69   Pulse Readings from Last 3 Encounters:  09/24/22 (!) 106  06/26/22 73  06/05/22 (!) 109   She continues checking her blood sugar at home regularly and reports it last measured 116 this morning. She reports her readings over the past week have been stable. She continues taking 1000 mg metformin 2x daily PO with meals and reports no new issues while taking it.  Lab Results  Component Value Date   HGBA1C 6.3 03/25/2022   Her asthma is stable while using Symbicort inhaler regularly.  She is due for vision care and is planning on scheduling an appointment.  She is UTD on flu vaccine, RSV vaccine, and the latest Covid-19 booster vaccine. She received all these vaccines at her pharmacy.  She has started taking turmeric supplements recently from her daughters recommendation. She had arthritis pain in the past.     Past Medical History:  Diagnosis Date   Anxiety    Asthma    Bipolar disorder ([place])    Bladder cancer ([place])    Breast cancer ([place])    Cancer of lower-inner quadrant of left female breast ([place]) 12/08/2013   COPD (chronic obstructive pulmonary disease) ([place])    former smoker, quit 1999   Depression    Diabetes mellitus    GERD (gastroesophageal reflux disease)    Hepatitis B infection    History of transfusion of whole blood    Approximately 2009, due to breast cancer/chemo.   Hyperlipidemia    Hypertension    Personal history of chemotherapy    Personal history of radiation therapy    PONV (postoperative nausea and vomiting)     Past Surgical History:  Procedure Laterality Date   ABDOMINAL HYSTERECTOMY     APPENDECTOMY  1961   bladder cancer  2006   BREAST LUMPECTOMY Left 2007   BREAST LUMPECTOMY WITH RADIOACTIVE SEED LOCALIZATION Right 03/09/2021   Procedure: RIGHT BREAST LUMPECTOMY WITH RADIOACTIVE SEED LOCALIZATION;  Surgeon: [name], MD;  Location: [facility];  Service: General;  Laterality: Right;   [place]    Family History  Problem Relation Age of Onset   Arthritis Mother    Breast cancer Mother    Transient ischemic attack Mother    Hypertension Mother    Heart disease Father    Diabetes Father    Cancer Father  bladder cancer   Throat cancer Father    Breast cancer Sister     Social History   Socioeconomic History   Marital status: Divorced    Spouse name: Not on file   Number of children: Not on file   Years of education: Not on file   Highest education level: Not on file  Occupational History   Occupation: retired    Comment: retired  Tobacco Use   Smoking status: Former    Packs/day: 1.50    Years: 33.00    Total pack years: 49.50    Types: Cigarettes    Start date: 12/08/1964    Quit date: 08/21/1998    Years since quitting: 24.1   Smokeless tobacco: Never   Tobacco  comments:    quit 16 years ago  Vaping Use   Vaping Use: Never used  Substance and Sexual Activity   Alcohol use: Not Currently    Alcohol/week: 0.0 standard drinks of alcohol   Drug use: No   Sexual activity: Not Currently    Partners: Male    Birth control/protection: Surgical  Other Topics Concern   Not on file  Social History Narrative   Exercise--- walks with neighbor   Social Determinants of Health   Financial Resource Strain: Low Risk  (10/01/2021)   Overall Financial Resource Strain (CARDIA)    Difficulty of Paying Living Expenses: Not hard at all  Food Insecurity: No Food Insecurity (10/01/2021)   Hunger Vital Sign    Worried About Running Out of Food in the Last Year: Never true    [place] in the Last Year: Never true  Transportation Needs: No Transportation Needs (10/01/2021)   PRAPARE - Hydrologist (Medical): No    Lack of Transportation (Non-Medical): No  Physical Activity: Sufficiently Active (09/28/2020)   Exercise Vital Sign    Days of Exercise per Week: 3 days    Minutes of Exercise per Session: 60 min  Stress: No Stress Concern Present (10/01/2021)   [place]    Feeling of Stress : Not at all  Social Connections: Moderately Integrated (10/01/2021)   Social Connection and Isolation Panel [NHANES]    Frequency of Communication with Friends and Family: More than three times a week    Frequency of Social Gatherings with Friends and Family: More than three times a week    Attends Religious Services: More than 4 times per year    Active Member of [employer] or Organizations: Yes    Attends Music therapist: More than 4 times per year    Marital Status: Divorced  Intimate Partner Violence: Not At Risk (10/01/2021)   Humiliation, Afraid, Rape, and Kick questionnaire    Fear of Current or Ex-Partner: No    Emotionally Abused: No    Physically Abused:  No    Sexually Abused: No    Outpatient Medications Prior to Visit  Medication Sig Dispense Refill   albuterol (VENTOLIN HFA) 108 (90 Base) MCG/ACT inhaler Inhale 2 puffs into the lungs every 6 (six) hours as needed for wheezing or shortness of breath. 18 g 5   Alcohol Swabs (DROPSAFE ALCOHOL PREP) 70 % PADS USE  IN THE MORNING AND AT BEDTIME. 200 each 3   alendronate (FOSAMAX) 70 MG tablet TAKE 1 TABLET BY MOUTH ONCE A WEEK WITH A FULL GLASS OF WATER ON AN EMPTY STOMACH 12 tablet 3   aspirin 81 MG  tablet Take 81 mg by mouth daily.     Blood Glucose Calibration (TRUE METRIX LEVEL 1) Low SOLN USE AS DIRECTED WITH GLUCOSE METER 1 each PRN   Blood Glucose Monitoring Suppl (TRUE METRIX AIR GLUCOSE METER) w/Device KIT Use to check blood sugar once a day. 1 kit 0   budesonide-formoterol (SYMBICORT) 160-4.5 MCG/ACT inhaler Inhale 2 puffs into the lungs in the morning and at bedtime. ID# [id number] 30.6 g 2   Calcium Carb-Cholecalciferol (CALCIUM 1000 + D PO) Take by mouth 2 (two) times daily.     Cholecalciferol (VITAMIN D) 2000 units CAPS Take 2,000 Units by mouth daily.     citalopram (CELEXA) 10 MG tablet Take 0.5 tablets (5 mg total) by mouth at bedtime. 45 tablet 1   ezetimibe (ZETIA) 10 MG tablet TAKE 1 TABLET EVERY DAY 90 tablet 1   glucose blood (TRUE METRIX BLOOD GLUCOSE TEST) test strip TEST BLOOD SUGAR EVERY DAY 100 strip 12   letrozole (FEMARA) 2.5 MG tablet TAKE 1 TABLET EVERY DAY 90 tablet 3   LORazepam (ATIVAN) 0.5 MG tablet Take 1 tablet (0.5 mg total) by mouth 2 (two) times daily as needed. 90 tablet 0   LORazepam (ATIVAN) 1 MG tablet Take 1 mg by mouth daily as needed.     losartan (COZAAR) 50 MG tablet TAKE 1 TABLET EVERY DAY 90 tablet 1   metFORMIN (GLUCOPHAGE) 1000 MG tablet TAKE 1 TABLET TWICE DAILY WITH MEALS 180 tablet 1   Multiple Vitamin (MULTIVITAMIN) tablet Take 1 tablet by mouth daily.     Multiple Vitamins-Minerals (HAIR SKIN AND NAILS FORMULA) TABS Take 1 tablet by  mouth daily. 2,500 mg daily     nateglinide (STARLIX) 60 MG tablet TAKE 1 TABLET THREE TIMES DAILY WITH MEALS 270 tablet 1   OLANZapine (ZYPREXA) 5 MG tablet 1/2 tablet daily (Patient taking differently: 5 mg daily.) 45 tablet 1   pantoprazole (PROTONIX) 40 MG tablet TAKE 1 TABLET EVERY DAY 90 tablet 1   rosuvastatin (CRESTOR) 5 MG tablet TAKE 1 TABLET THREE TIMES WEEKLY 39 tablet 2   Spacer/Aero Chamber Mouthpiece MISC To use with inhaler 1 each 1   TRUEplus Lancets 33G MISC TEST BLOOD SUGAR EVERY DAY 100 each 12   No facility-administered medications prior to visit.    Allergies  Allergen Reactions   Fluoxetine Other (See Comments)    seizure   Fluoxetine Hcl Other (See Comments)    seizure   Lamotrigine Itching   Sulfa Antibiotics Shortness Of Breath   Sulfa Antibiotics    Sulfa Drugs Cross Reactors Shortness Of Breath        Statins Other (See Comments)    Muscle pain    Review of Systems  Constitutional:  Negative for chills, fever and malaise/fatigue.  HENT:  Negative for congestion and hearing loss.   Eyes:  Negative for discharge.  Respiratory:  Negative for cough, sputum production and shortness of breath.   Cardiovascular:  Negative for chest pain, palpitations and leg swelling.  Gastrointestinal:  Negative for abdominal pain, blood in stool, constipation, diarrhea, heartburn, nausea and vomiting.  Genitourinary:  Negative for dysuria, frequency, hematuria and urgency.  Musculoskeletal:  Negative for back pain, falls and myalgias.  Skin:  Negative for rash.  Neurological:  Negative for dizziness, sensory change, loss of consciousness, weakness and headaches.  Endo/Heme/Allergies:  Negative for environmental allergies. Does not bruise/bleed easily.  Psychiatric/Behavioral:  Negative for depression and suicidal ideas. The patient is not nervous/anxious and does not  have insomnia.        Objective:    Physical Exam Vitals and nursing note reviewed.   Constitutional:      General: She is not in acute distress.    Appearance: Normal appearance. She is not ill-appearing.  HENT:     Head: Normocephalic and atraumatic.     Right Ear: External ear normal.     Left Ear: External ear normal.  Eyes:     Extraocular Movements: Extraocular movements intact.     Pupils: Pupils are equal, round, and reactive to light.  Cardiovascular:     Rate and Rhythm: Normal rate and regular rhythm.     Heart sounds: Normal heart sounds. No murmur heard.    No gallop.  Pulmonary:     Effort: Pulmonary effort is normal. No respiratory distress.     Breath sounds: Normal breath sounds. No wheezing or rales.  Skin:    General: Skin is warm and dry.  Neurological:     Mental Status: She is alert and oriented to person, place, and time.  Psychiatric:        Judgment: Judgment normal.     BP 124/84 (BP Location: Right Arm, Patient Position: Sitting, Cuff Size: Normal)   Pulse (!) 106   Temp 97.6 F (36.4 C) (Oral)   Resp 18   Ht 5' 4" (1.626 m)   Wt 148 lb 3.2 oz (67.2 kg)   SpO2 95%   BMI 25.44 kg/m  Wt Readings from Last 3 Encounters:  09/24/22 148 lb 3.2 oz (67.2 kg)  06/26/22 151 lb (68.5 kg)  06/05/22 151 lb 12.8 oz (68.9 kg)    Diabetic Foot Exam - Simple   Simple Foot Form Diabetic Foot exam was performed with the following findings: Yes 09/24/2022 11:22 AM  Visual Inspection No deformities, no ulcerations, no other skin breakdown bilaterally: Yes Sensation Testing Intact to touch and monofilament testing bilaterally: Yes Pulse Check Posterior Tibialis and Dorsalis pulse intact bilaterally: Yes Comments    Lab Results  Component Value Date   WBC 7.2 06/26/2022   HGB 13.1 06/26/2022   HCT 41.6 06/26/2022   PLT 200 06/26/2022   GLUCOSE 100 (H) 06/26/2022   CHOL 136 03/25/2022   TRIG 120.0 03/25/2022   HDL 51.00 03/25/2022   LDLDIRECT 145.6 06/22/2013   LDLCALC 61 03/25/2022   ALT 31 06/26/2022   AST 32 06/26/2022   NA  139 06/26/2022   K 4.5 06/26/2022   CL 103 06/26/2022   CREATININE 0.91 06/26/2022   BUN 16 06/26/2022   CO2 27 06/26/2022   TSH 0.80 01/30/2015   INR 0.97 09/25/2011   HGBA1C 6.3 03/25/2022   MICROALBUR 2.8 (H) 03/25/2022    Lab Results  Component Value Date   TSH 0.80 01/30/2015   Lab Results  Component Value Date   WBC 7.2 06/26/2022   HGB 13.1 06/26/2022   HCT 41.6 06/26/2022   MCV 91.4 06/26/2022   PLT 200 06/26/2022   Lab Results  Component Value Date   NA 139 06/26/2022   K 4.5 06/26/2022   CHLORIDE 103 01/01/2017   CO2 27 06/26/2022   GLUCOSE 100 (H) 06/26/2022   BUN 16 06/26/2022   CREATININE 0.91 06/26/2022   BILITOT 0.2 (L) 06/26/2022   ALKPHOS 33 (L) 06/26/2022   AST 32 06/26/2022   ALT 31 06/26/2022   PROT 7.0 06/26/2022   ALBUMIN 4.6 06/26/2022   CALCIUM 10.2 06/26/2022   ANIONGAP 9 06/26/2022  EGFR 70 (L) 01/01/2017   GFR 65.83 03/25/2022   Lab Results  Component Value Date   CHOL 136 03/25/2022   Lab Results  Component Value Date   HDL 51.00 03/25/2022   Lab Results  Component Value Date   LDLCALC 61 03/25/2022   Lab Results  Component Value Date   TRIG 120.0 03/25/2022   Lab Results  Component Value Date   CHOLHDL 3 03/25/2022   Lab Results  Component Value Date   HGBA1C 6.3 03/25/2022       Assessment & Plan:   Problem List Items Addressed This Visit       Unprioritized   Uncontrolled type 2 diabetes mellitus with hyperglycemia ([place])    hgba1c to be checked, minimize simple carbs. Increase exercise as tolerated. Continue current meds       Inadequately controlled diabetes mellitus ([place])    Per endo      Hyperlipidemia associated with type 2 diabetes mellitus ([place])    Encourage heart healthy diet such as MIND or DASH diet, increase exercise, avoid trans fats, simple carbohydrates and processed foods, consider a krill or fish or flaxseed oil cap daily.        Relevant Orders   CBC with Differential/Platelet    Comprehensive metabolic panel   Hemoglobin A1c   Lipid panel   Essential hypertension    Well controlled, no changes to meds. Encouraged heart healthy diet such as the DASH diet and exercise as tolerated.        Other Visit Diagnoses     Type 2 diabetes mellitus with hyperglycemia, without long-term current use of insulin ([place])    -  Primary   Relevant Orders   CBC with Differential/Platelet   Comprehensive metabolic panel   Hemoglobin A1c   Lipid panel   Primary hypertension       Relevant Orders   CBC with Differential/Platelet   Comprehensive metabolic panel   Hemoglobin A1c   Lipid panel        No orders of the defined types were placed in this encounter.   IAnn Held, DO, personally preformed the services described in this documentation.  All medical record entries made by the scribe were at my direction and in my presence.  I have reviewed the chart and discharge instructions (if applicable) and agree that the record reflects my personal performance and is accurate and complete. 09/24/2022   I,Shehryar Baig,acting as a scribe for Ann Held, DO.,have documented all relevant documentation on the behalf of Ann Held, DO,as directed by  Ann Held, DO while in the presence of Ann Held, DO.   Ann Held, DO

## 2022-09-24 NOTE — Patient Instructions (Signed)

## 2022-09-24 NOTE — Assessment & Plan Note (Signed)
Encourage heart healthy diet such as MIND or DASH diet, increase exercise, avoid trans fats, simple carbohydrates and processed foods, consider a krill or fish or flaxseed oil cap daily.  °

## 2022-09-24 NOTE — Assessment & Plan Note (Signed)
Per endo °

## 2022-09-24 NOTE — Assessment & Plan Note (Signed)
hgba1c to be checked, minimize simple carbs. Increase exercise as tolerated. Continue current meds  

## 2022-09-24 NOTE — Assessment & Plan Note (Signed)
Well controlled, no changes to meds. Encouraged heart healthy diet such as the DASH diet and exercise as tolerated.  °

## 2022-09-25 ENCOUNTER — Other Ambulatory Visit: Payer: Self-pay | Admitting: Family Medicine

## 2022-10-03 ENCOUNTER — Ambulatory Visit (INDEPENDENT_AMBULATORY_CARE_PROVIDER_SITE_OTHER): Payer: Medicare HMO | Admitting: *Deleted

## 2022-10-03 DIAGNOSIS — Z0000 Encounter for general adult medical examination without abnormal findings: Secondary | ICD-10-CM

## 2022-10-03 DIAGNOSIS — Z961 Presence of intraocular lens: Secondary | ICD-10-CM | POA: Diagnosis not present

## 2022-10-03 DIAGNOSIS — E119 Type 2 diabetes mellitus without complications: Secondary | ICD-10-CM | POA: Diagnosis not present

## 2022-10-03 DIAGNOSIS — H524 Presbyopia: Secondary | ICD-10-CM | POA: Diagnosis not present

## 2022-10-03 NOTE — Progress Notes (Signed)
Subjective:   Michelle Long is a 76 y.o. female who presents for Medicare Annual (Subsequent) preventive examination.  I connected with  Brent Bulla on 10/03/22 by a audio enabled telemedicine application and verified that I am speaking with the correct person using two identifiers.  Patient Location: Home  Provider Location: Office/Clinic  I discussed the limitations of evaluation and management by telemedicine. The patient expressed understanding and agreed to proceed.   Review of Systems    Defer to PCP Cardiac Risk Factors include: advanced age (>34mn, >>30women);diabetes mellitus;dyslipidemia;hypertension     Objective:    There were no vitals filed for this visit. There is no height or weight on file to calculate BMI.     10/03/2022    1:05 PM 06/26/2022    3:52 PM 01/24/2022    1:45 PM 10/01/2021    1:03 PM 06/29/2021   12:30 PM 04/26/2021   12:27 PM 03/12/2021   12:10 PM  Advanced Directives  Does Patient Have a Medical Advance Directive? _0  Yes Yes  Type of AParamed[place]of [place] will Living will;Healthcare Power of Attorney Living will;Healthcare Power of AFort Indiantown GapLiving will Living will;Healthcare Power of Attorney Living will;Healthcare Power of [place] will  Does patient want to make changes to medical advance directive? No - Patient declined No - Patient declined No - Patient declined  No - Patient declined No - Patient declined   Copy of HGlen Campbellin Chart? Yes - validated most recent copy scanned in chart (See row information)  No - copy requested No - copy requested       Current Medications (verified) Outpatient Encounter Medications as of 10/03/2022  Medication Sig   albuterol (VENTOLIN HFA) 108 (90 Base) MCG/ACT inhaler Inhale 2 puffs into the lungs every 6 (six) hours as needed for wheezing or shortness of breath.   Alcohol  Swabs (DROPSAFE ALCOHOL PREP) 70 % PADS USE  IN THE MORNING AND AT BEDTIME.   alendronate (FOSAMAX) 70 MG tablet TAKE 1 TABLET ONCE A WEEK WITH A FULL GLASS OF WATER ON AN EMPTY STOMACH   aspirin 81 MG tablet Take 81 mg by mouth daily.   Blood Glucose Calibration (TRUE METRIX LEVEL 1) Low SOLN USE AS DIRECTED WITH GLUCOSE METER   Blood Glucose Monitoring Suppl (TRUE METRIX AIR GLUCOSE METER) w/Device KIT Use to check blood sugar once a day.   budesonide-formoterol (SYMBICORT) 160-4.5 MCG/ACT inhaler Inhale 2 puffs into the lungs in the morning and at bedtime. ID# [id number]  Calcium Carb-Cholecalciferol (CALCIUM 1000 + D PO) Take by mouth 2 (two) times daily.   Cholecalciferol (VITAMIN D) 2000 units CAPS Take 2,000 Units by mouth daily.   citalopram (CELEXA) 10 MG tablet Take 0.5 tablets (5 mg total) by mouth at bedtime.   ezetimibe (ZETIA) 10 MG tablet TAKE 1 TABLET EVERY DAY   glucose blood (TRUE METRIX BLOOD GLUCOSE TEST) test strip TEST BLOOD SUGAR EVERY DAY   letrozole (FEMARA) 2.5 MG tablet TAKE 1 TABLET EVERY DAY   LORazepam (ATIVAN) 1 MG tablet Take 1 mg by mouth daily as needed.   losartan (COZAAR) 50 MG tablet TAKE 1 TABLET EVERY DAY   metFORMIN (GLUCOPHAGE) 1000 MG tablet TAKE 1 TABLET TWICE DAILY WITH MEALS   Multiple Vitamin (MULTIVITAMIN) tablet Take 1 tablet by mouth daily.   Multiple Vitamins-Minerals (HAIR SKIN AND NAILS FORMULA) TABS Take 1 tablet  by mouth daily. 2,500 mg daily   nateglinide (STARLIX) 60 MG tablet TAKE 1 TABLET THREE TIMES DAILY WITH MEALS   OLANZapine (ZYPREXA) 5 MG tablet 1/2 tablet daily (Patient taking differently: 5 mg daily.)   pantoprazole (PROTONIX) 40 MG tablet TAKE 1 TABLET EVERY DAY   rosuvastatin (CRESTOR) 5 MG tablet TAKE 1 TABLET THREE TIMES WEEKLY   Spacer/Aero Chamber Mouthpiece MISC To use with inhaler   TRUEplus Lancets 33G MISC TEST BLOOD SUGAR EVERY DAY   [DISCONTINUED] LORazepam (ATIVAN) 0.5 MG tablet Take 1 tablet (0.5 mg total) by  mouth 2 (two) times daily as needed.   No facility-administered encounter medications on file as of 10/03/2022.    Allergies (verified) Fluoxetine, Fluoxetine hcl, Lamotrigine, Sulfa antibiotics, Sulfa antibiotics, Sulfa drugs cross reactors, and Statins   History: Past Medical History:  Diagnosis Date   Anxiety    Asthma    Bipolar disorder ([place])    Bladder cancer ([place])    Breast cancer ([place])    Cancer of lower-inner quadrant of left female breast ([place]) 12/08/2013   COPD (chronic obstructive pulmonary disease) ([place])    former smoker, quit 1999   Depression    Diabetes mellitus    GERD (gastroesophageal reflux disease)    Hepatitis B infection    History of transfusion of whole blood    Approximately 2009, due to breast cancer/chemo.   Hyperlipidemia    Hypertension    Personal history of chemotherapy    Personal history of radiation therapy    PONV (postoperative nausea and vomiting)    Past Surgical History:  Procedure Laterality Date   ABDOMINAL HYSTERECTOMY     APPENDECTOMY  1961   bladder cancer  2006   BREAST LUMPECTOMY Left 2007   BREAST LUMPECTOMY WITH RADIOACTIVE SEED LOCALIZATION Right 03/09/2021   Procedure: RIGHT BREAST LUMPECTOMY WITH RADIOACTIVE SEED LOCALIZATION;  Surgeon: [name], MD;  Location: [facility];  Service: General;  Laterality: Right;   [place]   Family History  Problem Relation Age of Onset   Arthritis Mother    Breast cancer Mother    Transient ischemic attack Mother    Hypertension Mother    Heart disease Father    Diabetes Father    Cancer Father        bladder cancer   Throat cancer Father    Breast cancer Sister    Social History   Socioeconomic History   Marital status: Divorced    Spouse name: Not on file   Number of children: Not on file   Years of education: Not on file   Highest education level: Not on file  Occupational History   Occupation: retired     Comment: retired  Tobacco Use   Smoking status: Former    Packs/day: 1.50    Years: 33.00    Total pack years: 49.50    Types: Cigarettes    Start date: 12/08/1964    Quit date: 08/21/1998    Years since quitting: 24.1   Smokeless tobacco: Never   Tobacco comments:    quit 16 years ago  Vaping Use   Vaping Use: Never used  Substance and Sexual Activity   Alcohol use: Not Currently    Alcohol/week: 0.0 standard drinks of alcohol   Drug use: No   Sexual activity: Not Currently    Partners: Male    Birth control/protection: Surgical  Other Topics Concern   Not on  file  Social History Narrative   Exercise--- walks with neighbor   Social Determinants of Health   Financial Resource Strain: Low Risk  (10/01/2021)   Overall Financial Resource Strain (CARDIA)    Difficulty of Paying Living Expenses: Not hard at all  Food Insecurity: No Food Insecurity (10/03/2022)   Hunger Vital Sign    Worried About Running Out of Food in the Last Year: Never true    Ran Out of Food in the Last Year: Never true  Transportation Needs: No Transportation Needs (10/03/2022)   PRAPARE - Hydrologist (Medical): No    Lack of Transportation (Non-Medical): No  Physical Activity: Sufficiently Active (09/28/2020)   Exercise Vital Sign    Days of Exercise per Week: 3 days    Minutes of Exercise per Session: 60 min  Stress: No Stress Concern Present (10/01/2021)   [place]    Feeling of Stress : Not at all  Social Connections: Moderately Integrated (10/01/2021)   Social Connection and Isolation Panel [NHANES]    Frequency of Communication with Friends and Family: More than three times a week    Frequency of Social Gatherings with Friends and Family: More than three times a week    Attends Religious Services: More than 4 times per year    Active Member of [employer] or Organizations: Yes    Attends Programme researcher, broadcasting/film/video: More than 4 times per year    Marital Status: Divorced    Tobacco Counseling Counseling given: Not Answered Tobacco comments: quit 16 years ago   Clinical Intake:  Pre-visit preparation completed: Yes  Pain : No/denies pain  How often do you need to have someone help you when you read instructions, pamphlets, or other written materials from your doctor or pharmacy?: 1 - Never  Diabetic? Nutrition Risk Assessment:  Has the patient had any N/V/D within the last 2 months?  No  Does the patient have any non-healing wounds?  No  Has the patient had any unintentional weight loss or weight gain?  No   Diabetes:  Is the patient diabetic?  Yes  If diabetic, was a CBG obtained today?  No  Did the patient bring in their glucometer from home?  No  How often do you monitor your CBG's? never.   Financial Strains and Diabetes Management:  Are you having any financial strains with the device, your supplies or your medication? No .  Does the patient want to be seen by Chronic Care Management for management of their diabetes?  No  Would the patient like to be referred to a Nutritionist or for Diabetic Management?  No   Diabetic Exams:  Diabetic Eye Exam: Overdue for diabetic eye exam. Pt has been advised about the importance in completing this exam. Patient advised to call and schedule an eye exam. Diabetic Foot Exam: Completed 09/24/22   Interpreter Needed?: No  Information entered by :: Beatris Ship, [place]   Activities of Daily Living    10/03/2022    1:07 PM  In your present state of health, do you have any difficulty performing the following activities:  Hearing? 0  Vision? 0  Difficulty concentrating or making decisions? 1  Walking or climbing stairs? 0  Dressing or bathing? 0  Doing errands, shopping? 0  Preparing Food and eating ? N  Using the Toilet? N  In the past six months, have you accidently leaked urine? Y  Do  you have problems with loss of  bowel control? Y  Comment occasionally  Managing your Medications? N  Managing your Finances? N  Housekeeping or managing your Housekeeping? N    Patient Care Team: Carollee Herter, Alferd Apa, DO as PCP - General (Family Medicine) Hale Bogus., MD as Referring Physician (Gastroenterology) Franchot Gallo, MD as Consulting Physician (Urology) Ricard Dillon, MD (Psychiatry) Aretha Parrot as Consulting Physician (Optometry) Clearence Ped, DDS (Dentistry) Debby Bud, [place] (Physician Assistant) Volanda Napoleon, MD as Consulting Physician (Oncology)  Indicate any recent Medical Services you may have received from other than [facility] providers in the past year (date may be approximate).     Assessment:   This is a routine wellness examination for [facility].  Hearing/Vision screen No results found.  Dietary issues and exercise activities discussed: Current Exercise Habits: The patient does not participate in regular exercise at present, Exercise limited by: respiratory conditions(s)   Goals Addressed   None    Depression Screen    10/03/2022    1:07 PM 10/01/2021    1:07 PM 03/23/2021    1:22 PM 09/28/2020   11:11 AM 09/14/2019    2:48 PM 03/09/2018    1:22 PM 03/03/2017    2:14 PM  PHQ 2/9 Scores  PHQ - 2 Score 0 0 0 0 0 0 0  PHQ- 9 Score   0        Fall Risk    10/03/2022    1:05 PM 10/01/2021    1:05 PM 03/23/2021    1:21 PM 09/28/2020   11:10 AM 09/14/2019    2:48 PM  [place] in the past year? 0 0 0 0 0  Number falls in past yr: 0 0 0 0 0  Injury with Fall? 0 0 0 0 0  Risk for fall due to : No Fall Risks      Follow up Falls evaluation completed Falls prevention discussed  Falls prevention discussed Education provided;Falls prevention discussed    FALL RISK PREVENTION PERTAINING TO THE HOME:  Any stairs in or around the home? Yes  If so, are there any without handrails? No  Home free of loose throw rugs in walkways, pet beds, electrical cords, etc?  Yes  Adequate lighting in your home to reduce risk of falls? Yes   ASSISTIVE DEVICES UTILIZED TO PREVENT FALLS:  Life alert? No  Use of a cane, walker or w/c? No  Grab bars in the bathroom? No  Shower chair or bench in shower? No  Elevated toilet seat or a handicapped toilet? No   TIMED UP AND GO:  Was the test performed?  No, audio visit .    Cognitive Function:    03/03/2017    2:15 PM  MMSE - Mini Mental State Exam  Orientation to time 5  Orientation to Place 5  Registration 3  Attention/ Calculation 5  Recall 3  Language- name 2 objects 2  Language- repeat 1  Language- follow 3 step command 3  Language- read & follow direction 1  Write a sentence 1  Copy design 1  Total score 30        10/03/2022    1:12 PM 09/28/2020   11:19 AM 09/14/2019    2:52 PM  6CIT Screen  What Year? 0 points 0 points 0 points  What month? 0 points 0 points 0 points  What time? 0 points 0 points 0 points  Count back from 20 0  points 0 points 0 points  Months in reverse 0 points 0 points 0 points  Repeat phrase 2 points 0 points 0 points  Total Score 2 points 0 points 0 points    Immunizations Immunization History  Administered Date(s) Administered   Fluad Quad(high Dose 65+) 07/13/2019, 07/17/2020, 08/08/2022   Influenza Whole 07/01/2012   Influenza, High Dose Seasonal PF 06/19/2017, 07/09/2018   Influenza,inj,Quad PF,6+ Mos 08/12/2013, 07/22/2014, 07/03/2016   Influenza-Unspecified 09/20/2015, 06/29/2017, 07/23/2021   Moderna Covid-19 Vaccine Bivalent Booster 30yr & up 08/08/2022   Moderna SARS-COV2 Booster Vaccination 08/14/2020, 02/15/2021   Moderna Sars-Covid-2 Vaccination 11/11/2019, 11/11/2019, 12/17/2019   Pfizer Covid-19 Vaccine Bivalent Booster 139yr& up 07/24/2021   Pneumococcal Conjugate-13 01/30/2015   Pneumococcal Polysaccharide-23 10/23/2003, 10/22/2013, 03/23/2021   Respiratory Syncytial Virus Vaccine,Recomb Aduvanted(Arexvy) 08/15/2022   Td 06/22/2013    Tdap 09/20/2015   Zoster Recombinat (Shingrix) 07/25/2019   Zoster, Live 09/07/2013    TDAP status: Up to date  Flu Vaccine status: Up to date  Pneumococcal vaccine status: Up to date  Covid-19 vaccine status: Information provided on how to obtain vaccines.   Qualifies for Shingles Vaccine? Yes   Zostavax completed Yes   Shingrix Completed?: No.    Education has been provided regarding the importance of this vaccine. Patient has been advised to call insurance company to determine out of pocket expense if they have not yet received this vaccine. Advised may also receive vaccine at local pharmacy or Health Dept. Verbalized acceptance and understanding.  Screening Tests Health Maintenance  Topic Date Due   Zoster Vaccines- Shingrix (2 of 2) 09/19/2019   OPHTHALMOLOGY EXAM  09/19/2021   Medicare Annual Wellness (AWV)  10/01/2022   COVID-19 Vaccine (6 - 2023-24 season) 10/03/2022   Diabetic kidney evaluation - Urine ACR  03/26/2023   HEMOGLOBIN A1C  03/26/2023   COLONOSCOPY (Pts [id number] coverage will need to be confirmed)  06/04/2023   Diabetic kidney evaluation - eGFR measurement  09/25/2023   FOOT EXAM  09/25/2023   DTaP/Tdap/Td (3 - Td or Tdap) 09/19/2025   Pneumonia Vaccine 65+31ears old  Completed   INFLUENZA VACCINE  Completed   DEXA SCAN  Completed   Hepatitis C Screening  Completed   HPV VACCINES  Aged Out    Health Maintenance  Health Maintenance Due  Topic Date Due   Zoster Vaccines- Shingrix (2 of 2) 09/19/2019   OPHTHALMOLOGY EXAM  09/19/2021   Medicare Annual Wellness (AWV)  10/01/2022   COVID-19 Vaccine (6 - 2023-24 season) 10/03/2022    Colorectal cancer screening: Type of screening: Colonoscopy. Completed 06/03/18. Repeat every 5 years  Mammogram status: Completed 08/03/22. Repeat every year  Bone Density status: Completed 08/14/20. Results reflect: Bone density results: OSTEOPENIA. Repeat every 2 years.  Lung Cancer Screening: (Low Dose CT  Chest recommended if Age 55-29-80ars, 30 pack-year currently smoking OR have quit w/in 15years.) does not qualify.   Additional Screening:  Hepatitis C Screening: does qualify; Completed 11/17/15  Vision Screening: Recommended annual ophthalmology exams for early detection of glaucoma and other disorders of the eye. Is the patient up to date with their annual eye exam?  No  Who is the provider or what is the name of the office in which the patient attends annual eye exams? Dr. JohAretha Parrot pt is not established with a provider, would they like to be referred to a provider to establish care? No .   Dental Screening: Recommended annual dental exams for proper oral  hygiene  Community Resource Referral / Chronic Care Management: CRR required this visit?  No   CCM required this visit?  No      Plan:     I have personally reviewed and noted the following in the patient's chart:   Medical and social history Use of alcohol, tobacco or illicit drugs  Current medications and supplements including opioid prescriptions. Patient is not currently taking opioid prescriptions. Functional ability and status Nutritional status Physical activity Advanced directives List of other physicians Hospitalizations, surgeries, and ER visits in previous 12 months Vitals Screenings to include cognitive, depression, and falls Referrals and appointments  In addition, I have reviewed and discussed with patient certain preventive protocols, quality metrics, and best practice recommendations. A written personalized care plan for preventive services as well as general preventive health recommendations were provided to patient.   Due to this being a telephonic visit, the after visit summary with patients personalized plan was offered to patient via mail or my-chart. Patient would like to access on my-chart.  Beatris Ship, [place]   10/03/2022   Nurse Notes: None

## 2022-10-03 NOTE — Patient Instructions (Signed)
Michelle Long , Thank you for taking time to come for your Medicare Wellness Visit. I appreciate your ongoing commitment to your health goals. Please review the following plan we discussed and let me know if I can assist you in the future.   These are the goals we discussed:  Goals      Eat more fruits and vegetables     Increase physical activity     Begin silver sneakers     Increase water intake        This is a list of the screening recommended for you and due dates:  Health Maintenance  Topic Date Due   Zoster (Shingles) Vaccine (2 of 2) 09/19/2019   Eye exam for diabetics  09/19/2021   COVID-19 Vaccine (6 - 2023-24 season) 10/03/2022   Yearly kidney health urinalysis for diabetes  03/26/2023   Hemoglobin A1C  03/26/2023   Colon Cancer Screening  06/04/2023   Yearly kidney function blood test for diabetes  09/25/2023   Complete foot exam   09/25/2023   Medicare Annual Wellness Visit  10/04/2023   DTaP/Tdap/Td vaccine (3 - Td or Tdap) 09/19/2025   Pneumonia Vaccine  Completed   Flu Shot  Completed   DEXA scan (bone density measurement)  Completed   Hepatitis C Screening: USPSTF Recommendation to screen - Ages 43-79 yo.  Completed   HPV Vaccine  Aged Out     Next appointment: Follow up in one year for your annual wellness visit.   Preventive Care 18 Years and Older, Female Preventive care refers to lifestyle choices and visits with your health care provider that can promote health and wellness. What does preventive care include? A yearly physical exam. This is also called an annual well check. Dental exams once or twice a year. Routine eye exams. Ask your health care provider how often you should have your eyes checked. Personal lifestyle choices, including: Daily care of your teeth and gums. Regular physical activity. Eating a healthy diet. Avoiding tobacco and drug use. Limiting alcohol use. Practicing safe sex. Taking low-dose aspirin every day. Taking vitamin  and mineral supplements as recommended by your health care provider. What happens during an annual well check? The services and screenings done by your health care provider during your annual well check will depend on your age, overall health, lifestyle risk factors, and family history of disease. Counseling  Your health care provider may ask you questions about your: Alcohol use. Tobacco use. Drug use. Emotional well-being. Home and relationship well-being. Sexual activity. Eating habits. History of falls. Memory and ability to understand (cognition). Work and work Statistician. Reproductive health. Screening  You may have the following tests or measurements: Height, weight, and BMI. Blood pressure. Lipid and cholesterol levels. These may be checked every 5 years, or more frequently if you are over 24 years old. Skin check. Lung cancer screening. You may have this screening every year starting at age 69 if you have a 30-pack-year history of smoking and currently smoke or have quit within the past 15 years. Fecal occult blood test (FOBT) of the stool. You may have this test every year starting at age 39. Flexible sigmoidoscopy or colonoscopy. You may have a sigmoidoscopy every 5 years or a colonoscopy every 10 years starting at age 40. Hepatitis C blood test. Hepatitis B blood test. Sexually transmitted disease (STD) testing. Diabetes screening. This is done by checking your blood sugar (glucose) after you have not eaten for a while (fasting). You may have  this done every 1-3 years. Bone density scan. This is done to screen for osteoporosis. You may have this done starting at age 47. Mammogram. This may be done every 1-2 years. Talk to your health care provider about how often you should have regular mammograms. Talk with your health care provider about your test results, treatment options, and if necessary, the need for more tests. Vaccines  Your health care provider may recommend  certain vaccines, such as: Influenza vaccine. This is recommended every year. Tetanus, diphtheria, and acellular pertussis (Tdap, Td) vaccine. You may need a Td booster every 10 years. Zoster vaccine. You may need this after age 70. Pneumococcal 13-valent conjugate (PCV13) vaccine. One dose is recommended after age 8. Pneumococcal polysaccharide (PPSV23) vaccine. One dose is recommended after age 67. Talk to your health care provider about which screenings and vaccines you need and how often you need them. This information is not intended to replace advice given to you by your health care provider. Make sure you discuss any questions you have with your health care provider. Document Released: 11/03/2015 Document Revised: 06/26/2016 Document Reviewed: 08/08/2015 Elsevier Interactive Patient Education  2017 [employer] Prevention in the Home Falls can cause injuries. They can happen to people of all ages. There are many things you can do to make your home safe and to help prevent falls. What can I do on the outside of my home? Regularly fix the edges of walkways and driveways and fix any cracks. Remove anything that might make you trip as you walk through a door, such as a raised step or threshold. Trim any bushes or trees on the path to your home. Use bright outdoor lighting. Clear any walking paths of anything that might make someone trip, such as rocks or tools. Regularly check to see if handrails are loose or broken. Make sure that both sides of any steps have handrails. Any raised decks and porches should have guardrails on the edges. Have any leaves, snow, or ice cleared regularly. Use sand or salt on walking paths during winter. Clean up any spills in your garage right away. This includes oil or grease spills. What can I do in the bathroom? Use night lights. Install grab bars by the toilet and in the tub and shower. Do not use towel bars as grab bars. Use non-skid mats or  decals in the tub or shower. If you need to sit down in the shower, use a plastic, non-slip stool. Keep the floor dry. Clean up any water that spills on the floor as soon as it happens. Remove soap buildup in the tub or shower regularly. Attach bath mats securely with double-sided non-slip rug tape. Do not have throw rugs and other things on the floor that can make you trip. What can I do in the bedroom? Use night lights. Make sure that you have a light by your bed that is easy to reach. Do not use any sheets or blankets that are too big for your bed. They should not hang down onto the floor. Have a firm chair that has side arms. You can use this for support while you get dressed. Do not have throw rugs and other things on the floor that can make you trip. What can I do in the kitchen? Clean up any spills right away. Avoid walking on wet floors. Keep items that you use a lot in easy-to-reach places. If you need to reach something above you, use a strong step stool  that has a grab bar. Keep electrical cords out of the way. Do not use floor polish or wax that makes floors slippery. If you must use wax, use non-skid floor wax. Do not have throw rugs and other things on the floor that can make you trip. What can I do with my stairs? Do not leave any items on the stairs. Make sure that there are handrails on both sides of the stairs and use them. Fix handrails that are broken or loose. Make sure that handrails are as long as the stairways. Check any carpeting to make sure that it is firmly attached to the stairs. Fix any carpet that is loose or worn. Avoid having throw rugs at the top or bottom of the stairs. If you do have throw rugs, attach them to the floor with carpet tape. Make sure that you have a light switch at the top of the stairs and the bottom of the stairs. If you do not have them, ask someone to add them for you. What else can I do to help prevent falls? Wear shoes that: Do not  have high heels. Have rubber bottoms. Are comfortable and fit you well. Are closed at the toe. Do not wear sandals. If you use a stepladder: Make sure that it is fully opened. Do not climb a closed stepladder. Make sure that both sides of the stepladder are locked into place. Ask someone to hold it for you, if possible. Clearly mark and make sure that you can see: Any grab bars or handrails. First and last steps. Where the edge of each step is. Use tools that help you move around (mobility aids) if they are needed. These include: Canes. Walkers. Scooters. Crutches. Turn on the lights when you go into a dark area. Replace any light bulbs as soon as they burn out. Set up your furniture so you have a clear path. Avoid moving your furniture around. If any of your floors are uneven, fix them. If there are any pets around you, be aware of where they are. Review your medicines with your doctor. Some medicines can make you feel dizzy. This can increase your chance of falling. Ask your doctor what other things that you can do to help prevent falls. This information is not intended to replace advice given to you by your health care provider. Make sure you discuss any questions you have with your health care provider. Document Released: 08/03/2009 Document Revised: 03/14/2016 Document Reviewed: 11/11/2014 Elsevier Interactive Patient Education  2017 [employer].

## 2022-10-10 DIAGNOSIS — F250 Schizoaffective disorder, bipolar type: Secondary | ICD-10-CM | POA: Diagnosis not present

## 2022-10-30 DIAGNOSIS — Z8551 Personal history of malignant neoplasm of bladder: Secondary | ICD-10-CM | POA: Diagnosis not present

## 2022-10-30 DIAGNOSIS — C678 Malignant neoplasm of overlapping sites of bladder: Secondary | ICD-10-CM | POA: Diagnosis not present

## 2022-12-04 ENCOUNTER — Other Ambulatory Visit: Payer: Self-pay | Admitting: Family Medicine

## 2022-12-04 DIAGNOSIS — K219 Gastro-esophageal reflux disease without esophagitis: Secondary | ICD-10-CM

## 2022-12-16 ENCOUNTER — Other Ambulatory Visit: Payer: Self-pay | Admitting: Family Medicine

## 2022-12-16 DIAGNOSIS — Z86010 Personal history of colonic polyps: Secondary | ICD-10-CM | POA: Diagnosis not present

## 2022-12-16 DIAGNOSIS — K5730 Diverticulosis of large intestine without perforation or abscess without bleeding: Secondary | ICD-10-CM | POA: Diagnosis not present

## 2022-12-16 DIAGNOSIS — K635 Polyp of colon: Secondary | ICD-10-CM | POA: Diagnosis not present

## 2022-12-16 DIAGNOSIS — D125 Benign neoplasm of sigmoid colon: Secondary | ICD-10-CM | POA: Diagnosis not present

## 2022-12-16 DIAGNOSIS — Z1211 Encounter for screening for malignant neoplasm of colon: Secondary | ICD-10-CM | POA: Diagnosis not present

## 2022-12-16 DIAGNOSIS — K640 First degree hemorrhoids: Secondary | ICD-10-CM | POA: Diagnosis not present

## 2022-12-16 DIAGNOSIS — I10 Essential (primary) hypertension: Secondary | ICD-10-CM

## 2022-12-16 LAB — HM COLONOSCOPY

## 2022-12-25 ENCOUNTER — Inpatient Hospital Stay: Payer: Medicare HMO | Admitting: Physician Assistant

## 2022-12-25 ENCOUNTER — Inpatient Hospital Stay: Payer: Medicare HMO | Attending: Hematology & Oncology

## 2022-12-25 ENCOUNTER — Encounter: Payer: Self-pay | Admitting: Physician Assistant

## 2022-12-25 VITALS — BP 132/63 | HR 90 | Temp 97.9°F | Resp 16 | Wt 148.8 lb

## 2022-12-25 DIAGNOSIS — C50312 Malignant neoplasm of lower-inner quadrant of left female breast: Secondary | ICD-10-CM | POA: Diagnosis not present

## 2022-12-25 DIAGNOSIS — Z170 Estrogen receptor positive status [ER+]: Secondary | ICD-10-CM

## 2022-12-25 DIAGNOSIS — C50912 Malignant neoplasm of unspecified site of left female breast: Secondary | ICD-10-CM | POA: Insufficient documentation

## 2022-12-25 DIAGNOSIS — J449 Chronic obstructive pulmonary disease, unspecified: Secondary | ICD-10-CM | POA: Diagnosis not present

## 2022-12-25 DIAGNOSIS — F319 Bipolar disorder, unspecified: Secondary | ICD-10-CM | POA: Diagnosis not present

## 2022-12-25 DIAGNOSIS — E119 Type 2 diabetes mellitus without complications: Secondary | ICD-10-CM | POA: Diagnosis not present

## 2022-12-25 DIAGNOSIS — Z79811 Long term (current) use of aromatase inhibitors: Secondary | ICD-10-CM | POA: Diagnosis not present

## 2022-12-25 LAB — CBC WITH DIFFERENTIAL (CANCER CENTER ONLY)
Abs Immature Granulocytes: 0.04 10*3/uL (ref 0.00–0.07)
Basophils Absolute: 0 10*3/uL (ref 0.0–0.1)
Basophils Relative: 1 %
Eosinophils Absolute: 0 10*3/uL (ref 0.0–0.5)
Eosinophils Relative: 0 %
HCT: 42.3 % (ref 36.0–46.0)
Hemoglobin: 13.3 g/dL (ref 12.0–15.0)
Immature Granulocytes: 1 %
Lymphocytes Relative: 19 %
Lymphs Abs: 1.6 10*3/uL (ref 0.7–4.0)
MCH: 28.6 pg (ref 26.0–34.0)
MCHC: 31.4 g/dL (ref 30.0–36.0)
MCV: 91 fL (ref 80.0–100.0)
Monocytes Absolute: 0.6 10*3/uL (ref 0.1–1.0)
Monocytes Relative: 7 %
Neutro Abs: 6.2 10*3/uL (ref 1.7–7.7)
Neutrophils Relative %: 72 %
Platelet Count: 194 10*3/uL (ref 150–400)
RBC: 4.65 MIL/uL (ref 3.87–5.11)
RDW: 13.5 % (ref 11.5–15.5)
WBC Count: 8.5 10*3/uL (ref 4.0–10.5)
nRBC: 0 % (ref 0.0–0.2)

## 2022-12-25 LAB — CMP (CANCER CENTER ONLY)
ALT: 33 U/L (ref 0–44)
AST: 33 U/L (ref 15–41)
Albumin: 4.6 g/dL (ref 3.5–5.0)
Alkaline Phosphatase: 35 U/L — ABNORMAL LOW (ref 38–126)
Anion gap: 13 (ref 5–15)
BUN: 13 mg/dL (ref 8–23)
CO2: 26 mmol/L (ref 22–32)
Calcium: 10 mg/dL (ref 8.9–10.3)
Chloride: 100 mmol/L (ref 98–111)
Creatinine: 0.82 mg/dL (ref 0.44–1.00)
GFR, Estimated: >60 mL/min (ref >60)
Glucose, Bld: 109 mg/dL — ABNORMAL HIGH (ref 70–99)
Potassium: 4.3 mmol/L (ref 3.5–5.1)
Sodium: 139 mmol/L (ref 135–145)
Total Bilirubin: 0.3 mg/dL (ref 0.3–1.2)
Total Protein: 7.2 g/dL (ref 6.5–8.1)

## 2022-12-25 LAB — VITAMIN D 25 HYDROXY (VIT D DEFICIENCY, FRACTURES): Vit D, 25-Hydroxy: 67.63 ng/mL (ref 30–100)

## 2022-12-25 LAB — LACTATE DEHYDROGENASE: LDH: 167 U/L (ref 98–192)

## 2022-12-26 NOTE — Progress Notes (Signed)
Hematology and Oncology Follow Up Visit  Michelle Long [id number] 03/26/1946 77 y.o. 12/26/2022   Principle Diagnosis:  Stage IIA (T2 N0M0) infiltrating ductal carcinoma of the left breast Usual Ductal Hyperplasia -- Complex sclerosing   Current Therapy:   Tamoxifen 20 mg by mouth daily - finished in September 2017 Femara 2.5 mg p.o. daily --started on 04/02/2021      Interim History:  [name] is here for follow-up. She was last seen by Dr. Marin Olp on 06/26/2022. In the interim, she denies any changes to her health.   Ms. Ciriello reports her energy and appetite are stable. She can complete all her ADLs on her own. She denies any nausea, vomiting or abdominal pain. Her bowel habits are unchanged without recurrent episodes of diarrhea or constipation. She denies easy bruising or signs of active bleeding.   She is tolerating Femara without any significant toxicities.   She denies fevers, chills, sweats, shortness of breath, chest pain, cough, no breast changes or palpable masses. She has no other complaints.    Medications:  Allergies as of 12/25/2022       Reactions   Fluoxetine Other (See Comments)   seizure   Fluoxetine Hcl Other (See Comments)   seizure   Lamotrigine Itching   Sulfa Antibiotics Shortness Of Breath   Sulfa Antibiotics    Sulfa Drugs Cross Reactors Shortness Of Breath      Statins Other (See Comments)   Muscle pain        Medication List        Accurate as of December 25, 2022 11:59 PM. If you have any questions, ask your nurse or doctor.          albuterol 108 (90 Base) MCG/ACT inhaler Commonly known as: VENTOLIN HFA Inhale 2 puffs into the lungs every 6 (six) hours as needed for wheezing or shortness of breath.   alendronate 70 MG tablet Commonly known as: FOSAMAX TAKE 1 TABLET ONCE A WEEK WITH A FULL GLASS OF WATER ON AN EMPTY STOMACH   aspirin 81 MG tablet Take 81 mg by mouth daily.   budesonide-formoterol 160-4.5 MCG/ACT inhaler Commonly  known as: Symbicort Inhale 2 puffs into the lungs in the morning and at bedtime. ID# [id number]   CALCIUM 1000 + D PO Take by mouth 2 (two) times daily.   citalopram 10 MG tablet Commonly known as: CELEXA Take 0.5 tablets (5 mg total) by mouth at bedtime.   DropSafe Alcohol Prep 70 % Pads USE  IN THE MORNING AND AT BEDTIME.   ezetimibe 10 MG tablet Commonly known as: ZETIA TAKE 1 TABLET EVERY DAY   Hair Skin and Nails Formula Tabs Take 1 tablet by mouth daily. 2,500 mg daily   letrozole 2.5 MG tablet Commonly known as: FEMARA TAKE 1 TABLET EVERY DAY   LORazepam 1 MG tablet Commonly known as: ATIVAN Take 1 mg by mouth daily as needed.   losartan 50 MG tablet Commonly known as: COZAAR TAKE 1 TABLET EVERY DAY   metFORMIN 1000 MG tablet Commonly known as: GLUCOPHAGE TAKE 1 TABLET TWICE DAILY WITH MEALS   multivitamin tablet Take 1 tablet by mouth daily.   nateglinide 60 MG tablet Commonly known as: STARLIX TAKE 1 TABLET THREE TIMES DAILY WITH MEALS   OLANZapine 5 MG tablet Commonly known as: ZYPREXA 1/2 tablet daily What changed:  how much to take when to take this additional instructions   pantoprazole 40 MG tablet Commonly known as: PROTONIX TAKE 1 TABLET  EVERY DAY   rosuvastatin 5 MG tablet Commonly known as: CRESTOR TAKE 1 TABLET THREE TIMES WEEKLY   Spacer/Aero Chamber Mouthpiece Misc To use with inhaler   True Metrix Air Glucose Meter w/Device Kit Use to check blood sugar once a day.   True Metrix Blood Glucose Test test strip Generic drug: glucose blood TEST BLOOD SUGAR EVERY DAY   True Metrix Level 1 Low Soln USE AS DIRECTED WITH GLUCOSE METER   TRUEplus Lancets 33G Misc TEST BLOOD SUGAR EVERY DAY   Vitamin D 50 MCG (2000 UT) Caps Take 2,000 Units by mouth daily.        Allergies:  Allergies  Allergen Reactions   Fluoxetine Other (See Comments)    seizure   Fluoxetine Hcl Other (See Comments)    seizure   Lamotrigine  Itching   Sulfa Antibiotics Shortness Of Breath   Sulfa Antibiotics    Sulfa Drugs Cross Reactors Shortness Of Breath        Statins Other (See Comments)    Muscle pain    Past Medical History, Surgical history, Social history, and Family History were reviewed and updated.  Review of Systems: Review of Systems  Constitutional: Negative.   HENT: Negative.    Eyes: Negative.   Respiratory: Negative.    Cardiovascular: Negative.   Gastrointestinal: Negative.   Genitourinary: Negative.   Musculoskeletal: Negative.   Skin: Negative.   Neurological: Negative.   Endo/Heme/Allergies: Negative.   Psychiatric/Behavioral: Negative.       Physical Exam:  weight is 148 lb 12.8 oz (67.5 kg). Her oral temperature is 97.9 F (36.6 C). Her blood pressure is 132/63 and her pulse is 90. Her respiration is 16 and oxygen saturation is 95%.   Wt Readings from Last 3 Encounters:  12/25/22 148 lb 12.8 oz (67.5 kg)  09/24/22 148 lb 3.2 oz (67.2 kg)  06/26/22 151 lb (68.5 kg)    Physical Exam Vitals reviewed.  HENT:     Head: Normocephalic and atraumatic.  Eyes:     Pupils: Pupils are equal, round, and reactive to light.  Cardiovascular:     Rate and Rhythm: Normal rate and regular rhythm.     Heart sounds: Normal heart sounds.  Pulmonary:     Effort: Pulmonary effort is normal.     Breath sounds: Normal breath sounds.  Chest:     Comments: Her breast exam showed changes from lumpectomy including some firmness. No palpable masses noted. Her right breast has no masses, edema or erythema. No axillary adenopathy, bilaterally. Abdominal:     General: Bowel sounds are normal.     Palpations: Abdomen is soft.  Musculoskeletal:        General: No tenderness or deformity. Normal range of motion.     Cervical back: Normal range of motion.  Lymphadenopathy:     Cervical: No cervical adenopathy.  Skin:    General: Skin is warm and dry.     Findings: No erythema or rash.  Neurological:      Mental Status: She is alert and oriented to person, place, and time.  Psychiatric:        Behavior: Behavior normal.        Thought Content: Thought content normal.        Judgment: Judgment normal.    Lab Results  Component Value Date   WBC 8.5 12/25/2022   HGB 13.3 12/25/2022   HCT 42.3 12/25/2022   MCV 91.0 12/25/2022   PLT 194 12/25/2022  No results found for: "FERRITIN", "IRON", "TIBC", "UIBC", "IRONPCTSAT" Lab Results  Component Value Date   RBC 4.65 12/25/2022   No results found for: "KPAFRELGTCHN", "LAMBDASER", "KAPLAMBRATIO" No results found for: "IGGSERUM", "IGA", "IGMSERUM" No results found for: "TOTALPROTELP", "ALBUMINELP", "A1GS", "A2GS", "BETS", "BETA2SER", "GAMS", "MSPIKE", "SPEI"   Chemistry      Component Value Date/Time   NA 139 12/25/2022 1453   NA 139 07/02/2017 1445   NA 142 01/01/2017 1059   K 4.3 12/25/2022 1453   K 4.6 07/02/2017 1445   K 4.6 01/01/2017 1059   CL 100 12/25/2022 1453   CL 103 07/02/2017 1445   CL 105 01/20/2015 1240   CO2 26 12/25/2022 1453   CO2 28 07/02/2017 1445   CO2 26 01/01/2017 1059   BUN 13 12/25/2022 1453   BUN 10 07/02/2017 1445   BUN 10.6 01/01/2017 1059   CREATININE 0.82 12/25/2022 1453   CREATININE 0.68 07/17/2020 1105   CREATININE 0.8 01/01/2017 1059      Component Value Date/Time   CALCIUM 10.0 12/25/2022 1453   CALCIUM 10.3 07/02/2017 1445   CALCIUM 10.1 01/01/2017 1059   ALKPHOS 35 (L) 12/25/2022 1453   ALKPHOS 47 07/02/2017 1445   ALKPHOS 49 01/01/2017 1059   AST 33 12/25/2022 1453   AST 60 (H) 01/01/2017 1059   ALT 33 12/25/2022 1453   ALT 63 (H) 01/01/2017 1059   BILITOT 0.3 12/25/2022 1453   BILITOT 0.64 01/01/2017 1059     Impression and Plan: Ms. Padley is a most delightful 77 year old postmenopausal white female. She had stage IIA ductal carcinoma the left breast. She completed tamoxifen in September 2017 and started Femara 2.5 mg p.o. daily on 04/02/2021.  Ms. Bostian is doing well without any  clinical symptoms of recurrence. She is tolerating Femara without any significant toxicities.   Labs from today were reviewed and require no intervention. No cytopenias, normal creatinine and LFTs, normal vitamin D levels.   Her last mammogram was done on 08/03/2022. Findings included probable benign 8 mm group of microcalcifications over the lower central right breast. She is scheduled for short-term follow up in April 2024 to ensure stability of these microcalcifications.   Recommend to return in 6 months with labs and follow up.   Patient expressed understanding and satisfaction with the plan provided.   I have spent a total of 30 minutes minutes of face-to-face and non-face-to-face time, preparing to see the patient, performing a medically appropriate examination, counseling and educating the patient, documenting clinical information in the electronic health record, and care coordination.   Dede Query PA-C Dept of Hematology and [place] [facility] [place], [place] 3/7/20243:01 PM

## 2022-12-29 ENCOUNTER — Encounter: Payer: Self-pay | Admitting: Family Medicine

## 2023-01-29 ENCOUNTER — Other Ambulatory Visit: Payer: Self-pay | Admitting: Family Medicine

## 2023-02-04 ENCOUNTER — Ambulatory Visit
Admission: RE | Admit: 2023-02-04 | Discharge: 2023-02-04 | Disposition: A | Discharge status: Home / Self Care | Payer: Medicare HMO | Source: Ambulatory Visit | Attending: Hematology & Oncology | Admitting: Hematology & Oncology

## 2023-02-04 DIAGNOSIS — R921 Mammographic calcification found on diagnostic imaging of breast: Secondary | ICD-10-CM | POA: Diagnosis not present

## 2023-02-11 ENCOUNTER — Other Ambulatory Visit: Payer: Self-pay | Admitting: *Deleted

## 2023-02-11 DIAGNOSIS — J4520 Mild intermittent asthma, uncomplicated: Secondary | ICD-10-CM

## 2023-02-11 MED ORDER — BUDESONIDE-FORMOTEROL FUMARATE 160-4.5 MCG/ACT IN AERO
2.0000 | INHALATION_SPRAY | Freq: Two times a day (BID) | RESPIRATORY_TRACT | 1 refills | Status: DC
Start: 2023-02-11 — End: 2023-06-12

## 2023-02-12 ENCOUNTER — Other Ambulatory Visit: Payer: Self-pay | Admitting: Hematology & Oncology

## 2023-02-12 DIAGNOSIS — R921 Mammographic calcification found on diagnostic imaging of breast: Secondary | ICD-10-CM

## 2023-03-27 ENCOUNTER — Other Ambulatory Visit: Payer: Self-pay | Admitting: Family Medicine

## 2023-03-27 ENCOUNTER — Encounter: Payer: Medicare HMO | Admitting: Family Medicine

## 2023-03-27 DIAGNOSIS — E1165 Type 2 diabetes mellitus with hyperglycemia: Secondary | ICD-10-CM

## 2023-03-28 ENCOUNTER — Ambulatory Visit (INDEPENDENT_AMBULATORY_CARE_PROVIDER_SITE_OTHER): Payer: Medicare HMO | Admitting: Family Medicine

## 2023-03-28 ENCOUNTER — Encounter: Payer: Self-pay | Admitting: Family Medicine

## 2023-03-28 ENCOUNTER — Other Ambulatory Visit: Payer: Self-pay | Admitting: Family Medicine

## 2023-03-28 ENCOUNTER — Telehealth: Payer: Self-pay | Admitting: *Deleted

## 2023-03-28 VITALS — BP 124/68 | HR 102 | Temp 98.0°F | Resp 16 | Ht 63.0 in | Wt 149.8 lb

## 2023-03-28 DIAGNOSIS — E2839 Other primary ovarian failure: Secondary | ICD-10-CM | POA: Diagnosis not present

## 2023-03-28 DIAGNOSIS — Z7984 Long term (current) use of oral hypoglycemic drugs: Secondary | ICD-10-CM

## 2023-03-28 DIAGNOSIS — E785 Hyperlipidemia, unspecified: Secondary | ICD-10-CM | POA: Diagnosis not present

## 2023-03-28 DIAGNOSIS — I10 Essential (primary) hypertension: Secondary | ICD-10-CM

## 2023-03-28 DIAGNOSIS — Z0000 Encounter for general adult medical examination without abnormal findings: Secondary | ICD-10-CM | POA: Diagnosis not present

## 2023-03-28 DIAGNOSIS — E1169 Type 2 diabetes mellitus with other specified complication: Secondary | ICD-10-CM

## 2023-03-28 DIAGNOSIS — E119 Type 2 diabetes mellitus without complications: Secondary | ICD-10-CM

## 2023-03-28 DIAGNOSIS — E1165 Type 2 diabetes mellitus with hyperglycemia: Secondary | ICD-10-CM | POA: Diagnosis not present

## 2023-03-28 LAB — CBC WITH DIFFERENTIAL/PLATELET
Basophils Absolute: 0 10*3/uL (ref 0.0–0.1)
Basophils Relative: 0.6 % (ref 0.0–3.0)
Eosinophils Absolute: 0.2 10*3/uL (ref 0.0–0.7)
Eosinophils Relative: 3.1 % (ref 0.0–5.0)
HCT: 40.4 % (ref 36.0–46.0)
Hemoglobin: 12.8 g/dL (ref 12.0–15.0)
Lymphocytes Relative: 24.2 % (ref 12.0–46.0)
Lymphs Abs: 1.5 10*3/uL (ref 0.7–4.0)
MCHC: 31.8 g/dL (ref 30.0–36.0)
MCV: 89.3 fl (ref 78.0–100.0)
Monocytes Absolute: 0.5 10*3/uL (ref 0.1–1.0)
Monocytes Relative: 8.7 % (ref 3.0–12.0)
Neutro Abs: 3.9 10*3/uL (ref 1.4–7.7)
Neutrophils Relative %: 63.4 % (ref 43.0–77.0)
Platelets: 179 10*3/uL (ref 150.0–400.0)
RBC: 4.52 Mil/uL (ref 3.87–5.11)
RDW: 15.1 % (ref 11.5–15.5)
WBC: 6.1 10*3/uL (ref 4.0–10.5)

## 2023-03-28 LAB — COMPREHENSIVE METABOLIC PANEL
ALT: 30 U/L (ref 0–35)
AST: 28 U/L (ref 0–37)
Albumin: 4.2 g/dL (ref 3.5–5.2)
Alkaline Phosphatase: 35 U/L — ABNORMAL LOW (ref 39–117)
BUN: 12 mg/dL (ref 6–23)
CO2: 30 mEq/L (ref 19–32)
Calcium: 9.7 mg/dL (ref 8.4–10.5)
Chloride: 101 mEq/L (ref 96–112)
Creatinine, Ser: 0.76 mg/dL (ref 0.40–1.20)
GFR: 75.82 mL/min (ref >60.00)
Glucose, Bld: 105 mg/dL — ABNORMAL HIGH (ref 70–99)
Potassium: 4.7 mEq/L (ref 3.5–5.1)
Sodium: 141 mEq/L (ref 135–145)
Total Bilirubin: 0.3 mg/dL (ref 0.2–1.2)
Total Protein: 6.5 g/dL (ref 6.0–8.3)

## 2023-03-28 LAB — MICROALBUMIN / CREATININE URINE RATIO
Creatinine,U: 83.1 mg/dL
Microalb Creat Ratio: 2.5 mg/g (ref 0.0–30.0)
Microalb, Ur: 2 mg/dL — ABNORMAL HIGH (ref 0.0–1.9)

## 2023-03-28 LAB — LIPID PANEL
Cholesterol: 155 mg/dL (ref 0–200)
HDL: 53.8 mg/dL (ref >39.00)
LDL Cholesterol: 74 mg/dL (ref 0–99)
NonHDL: 100.98
Total CHOL/HDL Ratio: 3
Triglycerides: 137 mg/dL (ref 0.0–149.0)
VLDL: 27.4 mg/dL (ref 0.0–40.0)

## 2023-03-28 LAB — HEMOGLOBIN A1C: Hgb A1c MFr Bld: 6.5 % (ref 4.6–6.5)

## 2023-03-28 NOTE — Telephone Encounter (Signed)
Patient states that after she eats like a big lunch she feels wore out and shakey.  It runs like 95 to 100.  Today after eating it was 100.  She thinks she may be on too much medication.  I advised that labs have not been reviewed yet but I will send message over to you.

## 2023-03-28 NOTE — Patient Instructions (Signed)
Preventive Care 65 Years and Older, Female Preventive care refers to lifestyle choices and visits with your health care provider that can promote health and wellness. Preventive care visits are also called wellness exams. What can I expect for my preventive care visit? Counseling Your health care provider may ask you questions about your: Medical history, including: Past medical problems. Family medical history. Pregnancy and menstrual history. History of falls. Current health, including: Memory and ability to understand (cognition). Emotional well-being. Home life and relationship well-being. Sexual activity and sexual health. Lifestyle, including: Alcohol, nicotine or tobacco, and drug use. Access to firearms. Diet, exercise, and sleep habits. Work and work environment. Sunscreen use. Safety issues such as seatbelt and bike helmet use. Physical exam Your health care provider will check your: Height and weight. These may be used to calculate your BMI (body mass index). BMI is a measurement that tells if you are at a healthy weight. Waist circumference. This measures the distance around your waistline. This measurement also tells if you are at a healthy weight and may help predict your risk of certain diseases, such as type 2 diabetes and high blood pressure. Heart rate and blood pressure. Body temperature. Skin for abnormal spots. What immunizations do I need?  Vaccines are usually given at various ages, according to a schedule. Your health care provider will recommend vaccines for you based on your age, medical history, and lifestyle or other factors, such as travel or where you work. What tests do I need? Screening Your health care provider may recommend screening tests for certain conditions. This may include: Lipid and cholesterol levels. Hepatitis C test. Hepatitis B test. HIV (human immunodeficiency virus) test. STI (sexually transmitted infection) testing, if you are at  risk. Lung cancer screening. Colorectal cancer screening. Diabetes screening. This is done by checking your blood sugar (glucose) after you have not eaten for a while (fasting). Mammogram. Talk with your health care provider about how often you should have regular mammograms. BRCA-related cancer screening. This may be done if you have a family history of breast, ovarian, tubal, or peritoneal cancers. Bone density scan. This is done to screen for osteoporosis. Talk with your health care provider about your test results, treatment options, and if necessary, the need for more tests. Follow these instructions at home: Eating and drinking  Eat a diet that includes fresh fruits and vegetables, whole grains, lean protein, and low-fat dairy products. Limit your intake of foods with high amounts of sugar, saturated fats, and salt. Take vitamin and mineral supplements as recommended by your health care provider. Do not drink alcohol if your health care provider tells you not to drink. If you drink alcohol: Limit how much you have to 0-1 drink a day. Know how much alcohol is in your drink. In the [place] one drink equals one 12 oz bottle of beer (355 mL), one 5 oz glass of wine (148 mL), or one 1 oz glass of hard liquor (44 mL). Lifestyle Brush your teeth every morning and night with fluoride toothpaste. Floss one time each day. Exercise for at least 30 minutes 5 or more days each week. Do not use any products that contain nicotine or tobacco. These products include cigarettes, chewing tobacco, and vaping devices, such as e-cigarettes. If you need help quitting, ask your health care provider. Do not use drugs. If you are sexually active, practice safe sex. Use a condom or other form of protection in order to prevent STIs. Take aspirin only as told by   your health care provider. Make sure that you understand how much to take and what form to take. Work with your health care provider to find out whether it  is safe and beneficial for you to take aspirin daily. Ask your health care provider if you need to take a cholesterol-lowering medicine (statin). Find healthy ways to manage stress, such as: Meditation, yoga, or listening to music. Journaling. Talking to a trusted person. Spending time with friends and family. Minimize exposure to UV radiation to reduce your risk of skin cancer. Safety Always wear your seat belt while driving or riding in a vehicle. Do not drive: If you have been drinking alcohol. Do not ride with someone who has been drinking. When you are tired or distracted. While texting. If you have been using any mind-altering substances or drugs. Wear a helmet and other protective equipment during sports activities. If you have firearms in your house, make sure you follow all gun safety procedures. What's next? Visit your health care provider once a year for an annual wellness visit. Ask your health care provider how often you should have your eyes and teeth checked. Stay up to date on all vaccines. This information is not intended to replace advice given to you by your health care provider. Make sure you discuss any questions you have with your health care provider. Document Revised: 04/04/2021 Document Reviewed: 04/04/2021 Elsevier Patient Education  2024 [employer]  

## 2023-03-28 NOTE — Progress Notes (Signed)
Subjective:   By signing my name below, I, Carlena Bjornstad, attest that this documentation has been prepared under the direction and in the presence of Seabron Spates R, DO. 03/28/2023.   Patient ID: Michelle Long, female    DOB: June 17, 1946, 77 y.o.   MRN: [id number]  Chief Complaint  Patient presents with   Annual Exam    Annual Rxam    HPI Patient is in today for a comprehensive physical exam.  She has a new, mild skin lesion of her left forearm which she attributes to recently bumping into an object with her skin tearing. Additionally she complains of a new fungal infection that she just discovered on her left foot. She has applied an OTC athlete's foot medication earlier today.  Social history:  No surgeries or procedures in the prior year. She denies any recent changes to her family medical history. Colonoscopy:  Last completed 12/16/2022. She reports that this found a polyp; recommended 5 year repeat colonoscopy. Follows with Dr. Lanae Boast. Dexa:  Last completed 08/14/2020. She is amenable to scheduling a repeat scan. Mammogram:  Last completed 02/04/2023. Immunizations:  Per our records, first Shingrix vaccine was received 07/25/2019. She affirms that she completed her Shingrix vaccination at [facility] on [address]. Covid-19 vaccine last received 08/08/2022. Influenza vaccine received 08/08/2022. Tdap received 09/20/2015. Pneumonia vaccine completed 03/23/2021. Diet:  She has been monitoring her blood sugars. Yesterday this was low for the first time. She had lunch at a buffet including pork tenderloin, but subsequently felt terrible when she arrived home. She tested her blood sugar at 95, which she states is abnormal for her after having eaten recently. Typically her blood sugars are 88-100 when she first wakes up in the mornings. Exercise:  She admits to not walking consistently for exercise. Dental:  She has routine examinations twice a year. Vision:  She had an eye exam  10/03/2022.  Denies having any fever, new muscle pain, joint pain, new moles, congestion, sinus pain, sore throat, chest pain, palpitations, cough, SOB, wheezing, n/v/d, constipation, blood in stool, dysuria, frequency, hematuria, at this time.  Past Medical History:  Diagnosis Date   Anxiety    Asthma    Bipolar disorder [facility])    Bladder cancer (HCC)    Breast cancer (HCC)    Cancer of lower-inner quadrant of left female breast (HCC) 12/08/2013   COPD (chronic obstructive pulmonary disease) (HCC)    former smoker, quit 1999   Depression    Diabetes mellitus    GERD (gastroesophageal reflux disease)    Hepatitis B infection    History of transfusion of whole blood    Approximately 2009, due to breast cancer/chemo.   Hyperlipidemia    Hypertension    Personal history of chemotherapy    Personal history of radiation therapy    PONV (postoperative nausea and vomiting)     Past Surgical History:  Procedure Laterality Date   ABDOMINAL HYSTERECTOMY     APPENDECTOMY  1961   bladder cancer  2006   BREAST LUMPECTOMY Left 2007   BREAST LUMPECTOMY WITH RADIOACTIVE SEED LOCALIZATION Right 03/09/2021   Procedure: RIGHT BREAST LUMPECTOMY WITH RADIOACTIVE SEED LOCALIZATION;  Surgeon: Griselda Miner, MD;  Location: [facility];  Service: General;  Laterality: Right;   PARTIAL HYSTERECTOMY  1991   TONSILLECTOMY  1951    Family History  Problem Relation Age of Onset   Arthritis Mother    Breast cancer Mother    Transient ischemic  attack Mother    Hypertension Mother    Heart disease Father    Diabetes Father    Cancer Father        bladder cancer   Throat cancer Father    Breast cancer Sister     Social History   Socioeconomic History   Marital status: Divorced    Spouse name: Not on file   Number of children: Not on file   Years of education: Not on file   Highest education level: Not on file  Occupational History   Occupation: retired    Comment: retired   Tobacco Use   Smoking status: Former    Packs/day: 1.50    Years: 33.00    Additional pack years: 0.00    Total pack years: 49.50    Types: Cigarettes    Start date: 12/08/1964    Quit date: 08/21/1998    Years since quitting: 24.6   Smokeless tobacco: Never   Tobacco comments:    quit 16 years ago  Vaping Use   Vaping Use: Never used  Substance and Sexual Activity   Alcohol use: Not Currently    Alcohol/week: 0.0 standard drinks of alcohol   Drug use: No   Sexual activity: Not Currently    Partners: Male    Birth control/protection: Surgical  Other Topics Concern   Not on file  Social History Narrative   Exercise--- no   Social Determinants of Health   Financial Resource Strain: Low Risk  (10/01/2021)   Overall Financial Resource Strain (CARDIA)    Difficulty of Paying Living Expenses: Not hard at all  Food Insecurity: No Food Insecurity (10/03/2022)   Hunger Vital Sign    Worried About Running Out of Food in the Last Year: Never true    Ran Out of Food in the Last Year: Never true  Transportation Needs: No Transportation Needs (10/03/2022)   PRAPARE - Administrator, Civil Service (Medical): No    Lack of Transportation (Non-Medical): No  Physical Activity: Sufficiently Active (09/28/2020)   Exercise Vital Sign    Days of Exercise per Week: 3 days    Minutes of Exercise per Session: 60 min  Stress: No Stress Concern Present (10/01/2021)   [employer] of Occupational Health - Occupational Stress Questionnaire    Feeling of Stress : Not at all  Social Connections: Moderately Integrated (10/01/2021)   Social Connection and Isolation Panel [NHANES]    Frequency of Communication with Friends and Family: More than three times a week    Frequency of Social Gatherings with Friends and Family: More than three times a week    Attends Religious Services: More than 4 times per year    Active Member of [employer] or Organizations: Yes    Attends Museum/gallery exhibitions officer: More than 4 times per year    Marital Status: Divorced  Intimate Partner Violence: Not At Risk (10/03/2022)   Humiliation, Afraid, Rape, and Kick questionnaire    Fear of Current or Ex-Partner: No    Emotionally Abused: No    Physically Abused: No    Sexually Abused: No    Outpatient Medications Prior to Visit  Medication Sig Dispense Refill   albuterol (VENTOLIN HFA) 108 (90 Base) MCG/ACT inhaler Inhale 2 puffs into the lungs every 6 (six) hours as needed for wheezing or shortness of breath. 18 g 5   Alcohol Swabs (DROPSAFE ALCOHOL PREP) 70 % PADS To use before checking blood sugars 200 each 12  alendronate (FOSAMAX) 70 MG tablet TAKE 1 TABLET ONCE A WEEK WITH A FULL GLASS OF WATER ON AN EMPTY STOMACH 12 tablet 3   aspirin 81 MG tablet Take 81 mg by mouth daily.     Blood Glucose Calibration (TRUE METRIX LEVEL 1) Low SOLN USE AS DIRECTED WITH GLUCOSE METER 1 each PRN   Blood Glucose Monitoring Suppl (TRUE METRIX AIR GLUCOSE METER) w/Device KIT Use to check blood sugar once a day. 1 kit 0   budesonide-formoterol (SYMBICORT) 160-4.5 MCG/ACT inhaler Inhale 2 puffs into the lungs in the morning and at bedtime. ID# [id number] 30.6 g 1   Calcium Carb-Cholecalciferol (CALCIUM 1000 + D PO) Take by mouth 2 (two) times daily.     Cholecalciferol (VITAMIN D) 2000 units CAPS Take 2,000 Units by mouth daily.     citalopram (CELEXA) 10 MG tablet Take 0.5 tablets (5 mg total) by mouth at bedtime. 45 tablet 1   ezetimibe (ZETIA) 10 MG tablet TAKE 1 TABLET EVERY DAY 90 tablet 1   letrozole (FEMARA) 2.5 MG tablet TAKE 1 TABLET EVERY DAY 90 tablet 3   LORazepam (ATIVAN) 1 MG tablet Take 1 mg by mouth daily as needed.     losartan (COZAAR) 50 MG tablet TAKE 1 TABLET EVERY DAY 90 tablet 1   metFORMIN (GLUCOPHAGE) 1000 MG tablet TAKE 1 TABLET TWICE DAILY WITH MEALS 180 tablet 3   Multiple Vitamin (MULTIVITAMIN) tablet Take 1 tablet by mouth daily.     Multiple Vitamins-Minerals (HAIR  SKIN AND NAILS FORMULA) TABS Take 1 tablet by mouth daily. 2,500 mg daily     nateglinide (STARLIX) 60 MG tablet TAKE 1 TABLET THREE TIMES DAILY WITH MEALS 270 tablet 1   OLANZapine (ZYPREXA) 5 MG tablet 1/2 tablet daily (Patient taking differently: 5 mg daily.) 45 tablet 1   pantoprazole (PROTONIX) 40 MG tablet TAKE 1 TABLET EVERY DAY 90 tablet 3   rosuvastatin (CRESTOR) 5 MG tablet TAKE 1 TABLET THREE TIMES WEEKLY 39 tablet 2   Spacer/Aero Chamber Mouthpiece MISC To use with inhaler 1 each 1   TRUE METRIX BLOOD GLUCOSE TEST test strip TEST BLOOD SUGAR EVERY DAY 100 strip 3   TRUEplus Lancets 33G MISC TEST BLOOD SUGAR EVERY DAY 100 each 12   No facility-administered medications prior to visit.    Allergies  Allergen Reactions   Fluoxetine Other (See Comments)    seizure   Fluoxetine Hcl Other (See Comments)    seizure   Lamotrigine Itching   Sulfa Antibiotics Shortness Of Breath   Sulfa Antibiotics    Sulfa Drugs Cross Reactors Shortness Of Breath        Statins Other (See Comments)    Muscle pain    Review of Systems  Constitutional:  Negative for fever and malaise/fatigue.  HENT:  Negative for congestion, sinus pain and sore throat.   Eyes:  Negative for blurred vision.  Respiratory:  Negative for cough, shortness of breath and wheezing.   Cardiovascular:  Negative for chest pain, palpitations and leg swelling.  Gastrointestinal:  Negative for abdominal pain, blood in stool, constipation, diarrhea, nausea and vomiting.  Genitourinary:  Negative for dysuria, frequency and hematuria.  Musculoskeletal:  Negative for falls, joint pain and myalgias.  Skin:  Positive for rash (+ Fungal infection of left foot).       + Small lesion of left forearm - New moles.  Neurological:  Negative for dizziness, loss of consciousness and headaches.  Endo/Heme/Allergies:  Negative for environmental allergies.  Psychiatric/Behavioral:  Negative for depression. The patient is not  nervous/anxious.        Objective:    Physical Exam Vitals and nursing note reviewed.  Constitutional:      Appearance: Normal appearance.  HENT:     Head: Normocephalic and atraumatic.     Right Ear: Tympanic membrane, ear canal and external ear normal.     Left Ear: Tympanic membrane, ear canal and external ear normal.  Eyes:     Extraocular Movements: Extraocular movements intact.     Pupils: Pupils are equal, round, and reactive to light.  Cardiovascular:     Rate and Rhythm: Normal rate and regular rhythm.     Heart sounds: Normal heart sounds. No murmur heard.    No gallop.  Pulmonary:     Effort: Pulmonary effort is normal. No respiratory distress.     Breath sounds: Normal breath sounds. No wheezing or rales.  Abdominal:     General: Bowel sounds are normal. There is no distension.     Palpations: Abdomen is soft.     Tenderness: There is no abdominal tenderness. There is no guarding.  Musculoskeletal:        General: Normal range of motion.  Feet:     Right foot:     Skin integrity: Callus present. No erythema.     Left foot:     Skin integrity: Erythema and callus present.     Comments: + Papular, erythematous rash of left medial foot. Skin:    General: Skin is warm and dry.     Findings: Erythema, lesion and rash present. Rash is papular.     Comments: Small skin lesion of left forearm secondary to recent abrasive injury. Erythematous rash present on left foot.  Neurological:     General: No focal deficit present.     Mental Status: She is alert and oriented to person, place, and time.  Psychiatric:        Mood and Affect: Mood normal.        Behavior: Behavior normal.     BP 124/68 (BP Location: Right Arm, Patient Position: Sitting, Cuff Size: Normal)   Pulse (!) 102   Temp 98 F (36.7 C) (Oral)   Resp 16   Ht 5\' 3"  (1.6 m)   Wt 149 lb 12.8 oz (67.9 kg)   SpO2 95%   BMI 26.54 kg/m  Wt Readings from Last 3 Encounters:  03/28/23 149 lb 12.8 oz  (67.9 kg)  12/25/22 148 lb 12.8 oz (67.5 kg)  09/24/22 148 lb 3.2 oz (67.2 kg)    Diabetic Foot Exam - Simple   Simple Foot Form Diabetic Foot exam was performed with the following findings: Yes 03/28/2023 11:09 AM  Visual Inspection See comments: Yes Sensation Testing Intact to touch and monofilament testing bilaterally: Yes Pulse Check Posterior Tibialis and Dorsalis pulse intact bilaterally: Yes Comments Bilateral calluses. Presence of papular erythematous rash of left medial foot.     Lab Results  Component Value Date   WBC 6.1 03/28/2023   HGB 12.8 03/28/2023   HCT 40.4 03/28/2023   PLT 179.0 03/28/2023   GLUCOSE 105 (H) 03/28/2023   CHOL 155 03/28/2023   TRIG 137.0 03/28/2023   HDL 53.80 03/28/2023   LDLDIRECT 145.6 06/22/2013   LDLCALC 74 03/28/2023   ALT 30 03/28/2023   AST 28 03/28/2023   NA 141 03/28/2023   K 4.7 03/28/2023   CL 101 03/28/2023   CREATININE 0.76 03/28/2023  BUN 12 03/28/2023   CO2 30 03/28/2023   TSH 0.80 01/30/2015   INR 0.97 09/25/2011   HGBA1C 6.5 03/28/2023   MICROALBUR 2.0 (H) 03/28/2023    Lab Results  Component Value Date   TSH 0.80 01/30/2015   Lab Results  Component Value Date   WBC 6.1 03/28/2023   HGB 12.8 03/28/2023   HCT 40.4 03/28/2023   MCV 89.3 03/28/2023   PLT 179.0 03/28/2023   Lab Results  Component Value Date   NA 141 03/28/2023   K 4.7 03/28/2023   CHLORIDE 103 01/01/2017   CO2 30 03/28/2023   GLUCOSE 105 (H) 03/28/2023   BUN 12 03/28/2023   CREATININE 0.76 03/28/2023   BILITOT 0.3 03/28/2023   ALKPHOS 35 (L) 03/28/2023   AST 28 03/28/2023   ALT 30 03/28/2023   PROT 6.5 03/28/2023   ALBUMIN 4.2 03/28/2023   CALCIUM 9.7 03/28/2023   ANIONGAP 13 12/25/2022   EGFR 70 (L) 01/01/2017   GFR 75.82 03/28/2023   Lab Results  Component Value Date   CHOL 155 03/28/2023   Lab Results  Component Value Date   HDL 53.80 03/28/2023   Lab Results  Component Value Date   LDLCALC 74 03/28/2023   Lab  Results  Component Value Date   TRIG 137.0 03/28/2023   Lab Results  Component Value Date   CHOLHDL 3 03/28/2023   Lab Results  Component Value Date   HGBA1C 6.5 03/28/2023       Assessment & Plan:   Problem List Items Addressed This Visit       Unprioritized   Estrogen deficiency   Relevant Orders   DG Bone Density   Preventative health care - Primary    Ghm utd Check labs  See AVS  Health Maintenance  Topic Date Due   Zoster Vaccines- Shingrix (2 of 2) 09/19/2019   COVID-19 Vaccine (6 - 2023-24 season) 04/13/2023 (Originally 10/03/2022)   INFLUENZA VACCINE  05/22/2023   HEMOGLOBIN A1C  09/27/2023   OPHTHALMOLOGY EXAM  10/04/2023   Medicare Annual Wellness (AWV)  10/04/2023   Diabetic kidney evaluation - eGFR measurement  03/27/2024   Diabetic kidney evaluation - Urine ACR  03/27/2024   FOOT EXAM  03/27/2024   DTaP/Tdap/Td (4 - Td or Tdap) 09/19/2025   Colonoscopy  12/17/2027   Pneumonia Vaccine 58+ Years old  Completed   DEXA SCAN  Completed   Hepatitis C Screening  Completed   HPV VACCINES  Aged Out        Hyperlipidemia associated with type 2 diabetes mellitus (HCC)    Encourage heart healthy diet such as MIND or DASH diet, increase exercise, avoid trans fats, simple carbohydrates and processed foods, consider a krill or fish or flaxseed oil cap daily.        Relevant Orders   Lipid panel (Completed)   Comprehensive metabolic panel (Completed)   Other Visit Diagnoses     Type 2 diabetes mellitus with hyperglycemia, without long-term current use of insulin (HCC)       Relevant Orders   Hemoglobin A1c (Completed)   Microalbumin / creatinine urine ratio (Completed)   Primary hypertension       Relevant Orders   CBC with Differential/Platelet (Completed)   Comprehensive metabolic panel (Completed)        No orders of the defined types were placed in this encounter.   IDonato Schultz, DO, personally preformed the services described in  this documentation.  All medical  record entries made by the scribe were at my direction and in my presence.  I have reviewed the chart and discharge instructions (if applicable) and agree that the record reflects my personal performance and is accurate and complete. 03/28/2023.  I,Mathew Stumpf,acting as a Neurosurgeon for [employer], DO.,have documented all relevant documentation on the behalf of Donato Schultz, DO,as directed by  Donato Schultz, DO while in the presence of Donato Schultz, DO.   Donato Schultz, DO

## 2023-03-30 NOTE — Assessment & Plan Note (Signed)
Ghm utd Check labs  See AVS  Health Maintenance  Topic Date Due   Zoster Vaccines- Shingrix (2 of 2) 09/19/2019   COVID-19 Vaccine (6 - 2023-24 season) 04/13/2023 (Originally 10/03/2022)   INFLUENZA VACCINE  05/22/2023   HEMOGLOBIN A1C  09/27/2023   OPHTHALMOLOGY EXAM  10/04/2023   Medicare Annual Wellness (AWV)  10/04/2023   Diabetic kidney evaluation - eGFR measurement  03/27/2024   Diabetic kidney evaluation - Urine ACR  03/27/2024   FOOT EXAM  03/27/2024   DTaP/Tdap/Td (4 - Td or Tdap) 09/19/2025   Colonoscopy  12/17/2027   Pneumonia Vaccine 15+ Years old  Completed   DEXA SCAN  Completed   Hepatitis C Screening  Completed   HPV VACCINES  Aged Out

## 2023-03-30 NOTE — Assessment & Plan Note (Signed)
Encourage heart healthy diet such as MIND or DASH diet, increase exercise, avoid trans fats, simple carbohydrates and processed foods, consider a krill or fish or flaxseed oil cap daily.  °

## 2023-03-31 NOTE — Telephone Encounter (Signed)
Spoke with patient and she stated that she has been taking Starlix at twice a day because she has been forgetting that third dose.  She thought Dr. Laury Axon had said that she can take twice a day.  Can she just take it once a day?

## 2023-03-31 NOTE — Telephone Encounter (Signed)
Left message on machine to call back  

## 2023-03-31 NOTE — Telephone Encounter (Signed)
Pt notified and will await to see what pcp says.

## 2023-04-07 MED ORDER — NATEGLINIDE 60 MG PO TABS: 60.0000 mg | ORAL_TABLET | Freq: Every day | ORAL | 0 refills | Status: DC

## 2023-04-07 NOTE — Telephone Encounter (Signed)
Pt notified and changed mar

## 2023-04-08 DIAGNOSIS — F250 Schizoaffective disorder, bipolar type: Secondary | ICD-10-CM | POA: Diagnosis not present

## 2023-04-15 ENCOUNTER — Other Ambulatory Visit: Payer: Self-pay | Admitting: Hematology & Oncology

## 2023-04-15 ENCOUNTER — Ambulatory Visit (INDEPENDENT_AMBULATORY_CARE_PROVIDER_SITE_OTHER): Payer: Medicare HMO | Admitting: Family Medicine

## 2023-04-15 VITALS — BP 130/60 | HR 111 | Temp 98.0°F | Resp 18 | Ht 63.0 in | Wt 150.8 lb

## 2023-04-15 DIAGNOSIS — R1032 Left lower quadrant pain: Secondary | ICD-10-CM

## 2023-04-15 DIAGNOSIS — R102 Pelvic and perineal pain: Secondary | ICD-10-CM | POA: Diagnosis not present

## 2023-04-15 LAB — POC URINALSYSI DIPSTICK (AUTOMATED)
Blood, UA: NEGATIVE
Glucose, UA: NEGATIVE
Ketones, UA: NEGATIVE
Nitrite, UA: NEGATIVE
Protein, UA: NEGATIVE
Spec Grav, UA: 1.01 (ref 1.010–1.025)
Urobilinogen, UA: 0.2 E.U./dL
pH, UA: 7 (ref 5.0–8.0)

## 2023-04-15 MED ORDER — AMOXICILLIN-POT CLAVULANATE 875-125 MG PO TABS
1.0000 | ORAL_TABLET | Freq: Two times a day (BID) | ORAL | 0 refills | Status: DC
Start: 2023-04-15 — End: 2023-07-04

## 2023-04-15 NOTE — Progress Notes (Addendum)
Established Patient Office Visit  Subjective   Patient ID: Michelle Long, female    DOB: 1946-09-24  Age: 77 y.o. MRN: [id number]  Chief Complaint  Patient presents with   Abdominal Pain    Sxs started earlier this month, left side and left back. No urinary sxs     HPI Discussed the use of AI scribe software for clinical note transcription with the patient, who gave verbal consent to proceed.  History of Present Illness   The patient, with a history of severe diverticulosis, presents with worsening abdominal pain that began in June. The pain, initially a 'teeny little throb,' has since progressed and radiates to the back. She denies diarrhea and hematochezia, but report constipation and increased burping. She also note swelling in her fingers. The patient denies pain with ambulation, but reports discomfort when transitioning from sitting to standing. She denies any new symptoms or changes in her chronic abdominal distention.      Patient Active Problem List   Diagnosis Date Noted   Cardiomyopathy due to chemotherapy (HCC) 03/23/2021   Thrombocytopenia (HCC) 03/23/2021   Porokeratosis 09/01/2019   Uncontrolled type 2 diabetes mellitus with hyperglycemia (HCC) 01/08/2019   Hyperlipidemia associated with type 2 diabetes mellitus (HCC) 01/08/2019   Impacted cerumen of left ear 12/10/2016   Essential hypertension 05/16/2016   Contact dermatitis 02/17/2015   Cancer of lower-inner quadrant of left female breast (HCC) 12/08/2013   Estrogen deficiency 10/22/2013   Athlete's foot 04/03/2013   Elevated BP 11/05/2012   Grief 10/06/2012   Fatigue 02/13/2012   Easy bruising 09/25/2011   Leg cramps 08/17/2011   Arm swelling 08/16/2011   UTI (lower urinary tract infection) 06/04/2011   Vitamin D deficiency 05/29/2011   Preventative health care 05/29/2011   Hx of abnormal cervical Pap smear 05/29/2011   Dysuria 05/15/2011   Dry skin 05/15/2011   Asthma    Bladder cancer (HCC)    Bipolar  disorder (HCC)    Inadequately controlled diabetes mellitus (HCC)    GERD (gastroesophageal reflux disease)    Past Medical History:  Diagnosis Date   Anxiety    Asthma    Bipolar disorder (HCC)    Bladder cancer (HCC)    Breast cancer (HCC)    Cancer of lower-inner quadrant of left female breast (HCC) 12/08/2013   COPD (chronic obstructive pulmonary disease) (HCC)    former smoker, quit 1999   Depression    Diabetes mellitus    GERD (gastroesophageal reflux disease)    Hepatitis B infection    History of transfusion of whole blood    Approximately 2009, due to breast cancer/chemo.   Hyperlipidemia    Hypertension    Personal history of chemotherapy    Personal history of radiation therapy    PONV (postoperative nausea and vomiting)    Past Surgical History:  Procedure Laterality Date   ABDOMINAL HYSTERECTOMY     APPENDECTOMY  1961   bladder cancer  2006   BREAST LUMPECTOMY Left 2007   BREAST LUMPECTOMY WITH RADIOACTIVE SEED LOCALIZATION Right 03/09/2021   Procedure: RIGHT BREAST LUMPECTOMY WITH RADIOACTIVE SEED LOCALIZATION;  Surgeon: Griselda Miner, MD;  Location: [facility];  Service: General;  Laterality: Right;   PARTIAL HYSTERECTOMY  1991   TONSILLECTOMY  1951   Social History   Tobacco Use   Smoking status: Former    Packs/day: 1.50    Years: 33.00    Additional pack years: 0.00    Total pack  years: 49.50    Types: Cigarettes    Start date: 12/08/1964    Quit date: 08/21/1998    Years since quitting: 24.6   Smokeless tobacco: Never   Tobacco comments:    quit 16 years ago  Vaping Use   Vaping Use: Never used  Substance Use Topics   Alcohol use: Not Currently    Alcohol/week: 0.0 standard drinks of alcohol   Drug use: No   Social History   Socioeconomic History   Marital status: Divorced    Spouse name: Not on file   Number of children: Not on file   Years of education: Not on file   Highest education level: Not on file  Occupational  History   Occupation: retired    Comment: retired  Tobacco Use   Smoking status: Former    Packs/day: 1.50    Years: 33.00    Additional pack years: 0.00    Total pack years: 49.50    Types: Cigarettes    Start date: 12/08/1964    Quit date: 08/21/1998    Years since quitting: 24.6   Smokeless tobacco: Never   Tobacco comments:    quit 16 years ago  Vaping Use   Vaping Use: Never used  Substance and Sexual Activity   Alcohol use: Not Currently    Alcohol/week: 0.0 standard drinks of alcohol   Drug use: No   Sexual activity: Not Currently    Partners: Male    Birth control/protection: Surgical  Other Topics Concern   Not on file  Social History Narrative   Exercise--- no   Social Determinants of Health   Financial Resource Strain: Low Risk  (10/01/2021)   Overall Financial Resource Strain (CARDIA)    Difficulty of Paying Living Expenses: Not hard at all  Food Insecurity: No Food Insecurity (10/03/2022)   Hunger Vital Sign    Worried About Running Out of Food in the Last Year: Never true    Ran Out of Food in the Last Year: Never true  Transportation Needs: No Transportation Needs (10/03/2022)   PRAPARE - Administrator, Civil Service (Medical): No    Lack of Transportation (Non-Medical): No  Physical Activity: Sufficiently Active (09/28/2020)   Exercise Vital Sign    Days of Exercise per Week: 3 days    Minutes of Exercise per Session: 60 min  Stress: No Stress Concern Present (10/01/2021)   [employer] of Occupational Health - Occupational Stress Questionnaire    Feeling of Stress : Not at all  Social Connections: Moderately Integrated (10/01/2021)   Social Connection and Isolation Panel [NHANES]    Frequency of Communication with Friends and Family: More than three times a week    Frequency of Social Gatherings with Friends and Family: More than three times a week    Attends Religious Services: More than 4 times per year    Active Member of [employer]  or Organizations: Yes    Attends Banker Meetings: More than 4 times per year    Marital Status: Divorced  Intimate Partner Violence: Not At Risk (10/03/2022)   Humiliation, Afraid, Rape, and Kick questionnaire    Fear of Current or Ex-Partner: No    Emotionally Abused: No    Physically Abused: No    Sexually Abused: No   Family Status  Relation Name Status   Mother  Deceased at age 57       natural causes   Father  Deceased at age 62  heart disease. cancer   Sister  Alive   Sister  Alive   Family History  Problem Relation Age of Onset   Arthritis Mother    Breast cancer Mother    Transient ischemic attack Mother    Hypertension Mother    Heart disease Father    Diabetes Father    Cancer Father        bladder cancer   Throat cancer Father    Breast cancer Sister    Allergies  Allergen Reactions   Fluoxetine Other (See Comments)    seizure   Fluoxetine Hcl Other (See Comments)    seizure   Lamotrigine Itching   Sulfa Antibiotics Shortness Of Breath   Sulfa Antibiotics    Sulfa Drugs Cross Reactors Shortness Of Breath        Statins Other (See Comments)    Muscle pain      Review of Systems  Constitutional:  Negative for fever and malaise/fatigue.  HENT:  Negative for congestion.   Eyes:  Negative for blurred vision.  Respiratory:  Negative for cough and shortness of breath.   Cardiovascular:  Negative for chest pain, palpitations and leg swelling.  Gastrointestinal:  Positive for abdominal pain. Negative for blood in stool, constipation, diarrhea, heartburn, melena and vomiting.  Musculoskeletal:  Negative for back pain.  Skin:  Negative for rash.  Neurological:  Negative for loss of consciousness and headaches.      Objective:     BP 130/60 (BP Location: Right Arm, Patient Position: Sitting, Cuff Size: Normal)   Pulse (!) 111   Temp 98 F (36.7 C) (Oral)   Resp 18   Ht 5\' 3"  (1.6 m)   Wt 150 lb 12.8 oz (68.4 kg)   SpO2 95%    BMI 26.71 kg/m  BP Readings from Last 3 Encounters:  04/15/23 130/60  03/28/23 124/68  12/25/22 132/63   Wt Readings from Last 3 Encounters:  04/15/23 150 lb 12.8 oz (68.4 kg)  03/28/23 149 lb 12.8 oz (67.9 kg)  12/25/22 148 lb 12.8 oz (67.5 kg)   SpO2 Readings from Last 3 Encounters:  04/15/23 95%  03/28/23 95%  12/25/22 95%      Physical Exam Vitals and nursing note reviewed.  Constitutional:      General: She is not in acute distress.    Appearance: Normal appearance. She is well-developed.  HENT:     Head: Normocephalic and atraumatic.  Eyes:     General: No scleral icterus.       Right eye: No discharge.        Left eye: No discharge.  Cardiovascular:     Rate and Rhythm: Normal rate and regular rhythm.     Heart sounds: No murmur heard. Pulmonary:     Effort: Pulmonary effort is normal. No respiratory distress.     Breath sounds: Normal breath sounds.  Abdominal:     General: Abdomen is flat. Bowel sounds are normal. There is no distension or abdominal bruit.     Tenderness: There is no abdominal tenderness.     Hernia: No hernia is present.  Musculoskeletal:        General: Normal range of motion.     Cervical back: Normal range of motion and neck supple.     Right lower leg: No edema.     Left lower leg: No edema.  Skin:    General: Skin is warm and dry.  Neurological:     General: No focal deficit  present.     Mental Status: She is alert and oriented to person, place, and time.  Psychiatric:        Mood and Affect: Mood normal.        Behavior: Behavior normal.        Thought Content: Thought content normal.        Judgment: Judgment normal.      Results for orders placed or performed in visit on 04/15/23  Urine Culture   Specimen: Urine  Result Value Ref Range   MICRO NUMBER: [id number]    SPECIMEN QUALITY: Adequate    Sample Source NOT GIVEN    STATUS: FINAL    Result: No Growth   CBC with Differential/Platelet  Result Value Ref Range    WBC 8.1 4.0 - 10.5 K/uL   RBC 4.32 3.87 - 5.11 Mil/uL   Hemoglobin 12.3 12.0 - 15.0 g/dL   HCT 54.0 98.1 - 19.1 %   MCV 89.6 78.0 - 100.0 fl   MCHC 31.8 30.0 - 36.0 g/dL   RDW 47.8 29.5 - 62.1 %   Platelets 185.0 150.0 - 400.0 K/uL   Neutrophils Relative % 68.8 43.0 - 77.0 %   Lymphocytes Relative 19.5 12.0 - 46.0 %   Monocytes Relative 8.9 3.0 - 12.0 %   Eosinophils Relative 2.0 0.0 - 5.0 %   Basophils Relative 0.8 0.0 - 3.0 %   Neutro Abs 5.6 1.4 - 7.7 K/uL   Lymphs Abs 1.6 0.7 - 4.0 K/uL   Monocytes Absolute 0.7 0.1 - 1.0 K/uL   Eosinophils Absolute 0.2 0.0 - 0.7 K/uL   Basophils Absolute 0.1 0.0 - 0.1 K/uL  Comprehensive metabolic panel  Result Value Ref Range   Sodium 137 135 - 145 mEq/L   Potassium 4.4 3.5 - 5.1 mEq/L   Chloride 100 96 - 112 mEq/L   CO2 27 19 - 32 mEq/L   Glucose, Bld 134 (H) 70 - 99 mg/dL   BUN 12 6 - 23 mg/dL   Creatinine, Ser 3.08 0.40 - 1.20 mg/dL   Total Bilirubin 0.3 0.2 - 1.2 mg/dL   Alkaline Phosphatase 36 (L) 39 - 117 U/L   AST 31 0 - 37 U/L   ALT 32 0 - 35 U/L   Total Protein 6.5 6.0 - 8.3 g/dL   Albumin 4.1 3.5 - 5.2 g/dL   GFR 65.78 >46.96 mL/min   Calcium 9.4 8.4 - 10.5 mg/dL  POCT Urinalysis Dipstick (Automated)  Result Value Ref Range   Color, UA yellow    Clarity, UA clear    Glucose, UA Negative Negative   Bilirubin, UA small    Ketones, UA negative    Spec Grav, UA 1.010 1.010 - 1.025   Blood, UA negative    pH, UA 7.0 5.0 - 8.0   Protein, UA Negative Negative   Urobilinogen, UA 0.2 0.2 or 1.0 E.U./dL   Nitrite, UA negative    Leukocytes, UA Trace (A) Negative    Last CBC Lab Results  Component Value Date   WBC 8.1 04/15/2023   HGB 12.3 04/15/2023   HCT 38.7 04/15/2023   MCV 89.6 04/15/2023   MCH 28.6 12/25/2022   RDW 15.1 04/15/2023   PLT 185.0 04/15/2023   Last metabolic panel Lab Results  Component Value Date   GLUCOSE 134 (H) 04/15/2023   NA 137 04/15/2023   K 4.4 04/15/2023   CL 100 04/15/2023   CO2 27  04/15/2023   BUN 12 04/15/2023  CREATININE 0.79 04/15/2023   GFRNONAA >60 12/25/2022   CALCIUM 9.4 04/15/2023   PROT 6.5 04/15/2023   ALBUMIN 4.1 04/15/2023   LABGLOB 2.8 07/02/2017   AGRATIO 1.6 07/02/2017   BILITOT 0.3 04/15/2023   ALKPHOS 36 (L) 04/15/2023   AST 31 04/15/2023   ALT 32 04/15/2023   ANIONGAP 13 12/25/2022   Last lipids Lab Results  Component Value Date   CHOL 155 03/28/2023   HDL 53.80 03/28/2023   LDLCALC 74 03/28/2023   LDLDIRECT 145.6 06/22/2013   TRIG 137.0 03/28/2023   CHOLHDL 3 03/28/2023   Last hemoglobin A1c Lab Results  Component Value Date   HGBA1C 6.5 03/28/2023   Last thyroid functions Lab Results  Component Value Date   TSH 0.80 01/30/2015   Last vitamin D Lab Results  Component Value Date   VD25OH 67.63 12/25/2022   Last vitamin B12 and Folate No results found for: "VITAMINB12", "FOLATE"    The 10-year ASCVD risk score (Arnett DK, et al., 2019) is: 44.5%    Assessment & Plan:   Problem List Items Addressed This Visit   None Visit Diagnoses     LLQ pain    -  Primary   Relevant Medications   amoxicillin-clavulanate (AUGMENTIN) 875-125 MG tablet   Other Relevant Orders   CBC with Differential/Platelet (Completed)   Comprehensive metabolic panel (Completed)   POCT Urinalysis Dipstick (Automated) (Completed)   Urine Culture (Completed)   Pelvic pain       Relevant Orders   US PELVIC COMPLETE WITH TRANSVAGINAL (Completed)   Urine Culture (Completed)     Assessment and Plan    Abdominal Pain: New onset of abdominal pain that radiates to the back. No diarrhea or blood in stool. Mild constipation reported. History of severe diverticuli. No tenderness on examination. Differential includes diverticulitis and ovarian pathology. -Order abdominal ultrasound to evaluate ovaries and other abdominal structures. -Start antibiotic therapy for possible diverticulitis. -Order urinalysis to rule out urinary tract infection. -Order  blood work to evaluate for other potential causes of abdominal pain. -Return to clinic with urine sample for urinalysis if unable to provide during current visit.        No follow-ups on file.    Donato Schultz, DO

## 2023-04-16 ENCOUNTER — Ambulatory Visit (HOSPITAL_BASED_OUTPATIENT_CLINIC_OR_DEPARTMENT_OTHER)
Admission: RE | Admit: 2023-04-16 | Discharge: 2023-04-16 | Disposition: A | Discharge status: Home / Self Care | Payer: Medicare HMO | Source: Ambulatory Visit | Attending: Family Medicine | Admitting: Family Medicine

## 2023-04-16 DIAGNOSIS — R1032 Left lower quadrant pain: Secondary | ICD-10-CM | POA: Diagnosis not present

## 2023-04-16 DIAGNOSIS — R102 Pelvic and perineal pain: Secondary | ICD-10-CM | POA: Insufficient documentation

## 2023-04-16 LAB — CBC WITH DIFFERENTIAL/PLATELET
Basophils Absolute: 0.1 10*3/uL (ref 0.0–0.1)
Basophils Relative: 0.8 % (ref 0.0–3.0)
Eosinophils Absolute: 0.2 10*3/uL (ref 0.0–0.7)
Eosinophils Relative: 2 % (ref 0.0–5.0)
HCT: 38.7 % (ref 36.0–46.0)
Hemoglobin: 12.3 g/dL (ref 12.0–15.0)
Lymphocytes Relative: 19.5 % (ref 12.0–46.0)
Lymphs Abs: 1.6 10*3/uL (ref 0.7–4.0)
MCHC: 31.8 g/dL (ref 30.0–36.0)
MCV: 89.6 fl (ref 78.0–100.0)
Monocytes Absolute: 0.7 10*3/uL (ref 0.1–1.0)
Monocytes Relative: 8.9 % (ref 3.0–12.0)
Neutro Abs: 5.6 10*3/uL (ref 1.4–7.7)
Neutrophils Relative %: 68.8 % (ref 43.0–77.0)
Platelets: 185 10*3/uL (ref 150.0–400.0)
RBC: 4.32 Mil/uL (ref 3.87–5.11)
RDW: 15.1 % (ref 11.5–15.5)
WBC: 8.1 10*3/uL (ref 4.0–10.5)

## 2023-04-16 LAB — COMPREHENSIVE METABOLIC PANEL
ALT: 32 U/L (ref 0–35)
AST: 31 U/L (ref 0–37)
Albumin: 4.1 g/dL (ref 3.5–5.2)
Alkaline Phosphatase: 36 U/L — ABNORMAL LOW (ref 39–117)
BUN: 12 mg/dL (ref 6–23)
CO2: 27 mEq/L (ref 19–32)
Calcium: 9.4 mg/dL (ref 8.4–10.5)
Chloride: 100 mEq/L (ref 96–112)
Creatinine, Ser: 0.79 mg/dL (ref 0.40–1.20)
GFR: 72.35 mL/min (ref >60.00)
Glucose, Bld: 134 mg/dL — ABNORMAL HIGH (ref 70–99)
Potassium: 4.4 mEq/L (ref 3.5–5.1)
Sodium: 137 mEq/L (ref 135–145)
Total Bilirubin: 0.3 mg/dL (ref 0.2–1.2)
Total Protein: 6.5 g/dL (ref 6.0–8.3)

## 2023-04-17 LAB — URINE CULTURE
MICRO NUMBER:: 15130214
Result:: NO GROWTH
SPECIMEN QUALITY:: ADEQUATE

## 2023-04-20 ENCOUNTER — Encounter: Payer: Self-pay | Admitting: Family Medicine

## 2023-04-21 ENCOUNTER — Telehealth: Payer: Self-pay | Admitting: Family Medicine

## 2023-04-21 ENCOUNTER — Other Ambulatory Visit: Payer: Self-pay

## 2023-04-21 MED ORDER — NATEGLINIDE 60 MG PO TABS: 60.0000 mg | ORAL_TABLET | Freq: Two times a day (BID) | ORAL | 1 refills | Status: DC

## 2023-04-21 NOTE — Telephone Encounter (Signed)
Please advise 

## 2023-04-21 NOTE — Telephone Encounter (Signed)
Pt called back to see if she needs to continue her antibiotic (amoxicillin) or if she needs to discontinue it since everything came back normal. Please call and advise

## 2023-04-21 NOTE — Telephone Encounter (Signed)
Spoke w/ Pt- informed to finish to abx.

## 2023-04-30 ENCOUNTER — Other Ambulatory Visit: Payer: Self-pay | Admitting: Family Medicine

## 2023-05-02 ENCOUNTER — Other Ambulatory Visit: Payer: Self-pay | Admitting: Family Medicine

## 2023-05-07 ENCOUNTER — Other Ambulatory Visit: Payer: Self-pay | Admitting: Family Medicine

## 2023-05-07 ENCOUNTER — Telehealth: Payer: Self-pay | Admitting: Family Medicine

## 2023-05-07 DIAGNOSIS — E1169 Type 2 diabetes mellitus with other specified complication: Secondary | ICD-10-CM

## 2023-05-07 DIAGNOSIS — E1165 Type 2 diabetes mellitus with hyperglycemia: Secondary | ICD-10-CM

## 2023-05-07 DIAGNOSIS — I10 Essential (primary) hypertension: Secondary | ICD-10-CM

## 2023-05-07 NOTE — Telephone Encounter (Signed)
Please advise 

## 2023-05-07 NOTE — Telephone Encounter (Signed)
Pt called to schedule her 3 month lab check. After reviewing chart, advised pt that there were no active orders and a note would have to be sent back to get those in prior to scheduling. Pt acknowledged understanding.

## 2023-05-08 NOTE — Telephone Encounter (Signed)
Orders have been placed. She just had labs  last month so sometime in September please

## 2023-05-08 NOTE — Telephone Encounter (Signed)
Pt called and scheduled

## 2023-05-19 ENCOUNTER — Other Ambulatory Visit: Payer: Self-pay | Admitting: Family Medicine

## 2023-05-19 ENCOUNTER — Telehealth: Payer: Self-pay | Admitting: Family Medicine

## 2023-05-19 DIAGNOSIS — I10 Essential (primary) hypertension: Secondary | ICD-10-CM

## 2023-05-19 DIAGNOSIS — J4520 Mild intermittent asthma, uncomplicated: Secondary | ICD-10-CM

## 2023-05-19 MED ORDER — ALBUTEROL SULFATE (2.5 MG/3ML) 0.083% IN NEBU: 2.5000 mg | INHALATION_SOLUTION | Freq: Four times a day (QID) | RESPIRATORY_TRACT | 1 refills | Status: DC | PRN

## 2023-05-19 MED ORDER — ALBUTEROL SULFATE HFA 108 (90 BASE) MCG/ACT IN AERS
2.0000 | INHALATION_SPRAY | Freq: Four times a day (QID) | RESPIRATORY_TRACT | 5 refills | Status: DC | PRN
Start: 2023-05-19 — End: 2023-05-20

## 2023-05-19 NOTE — Telephone Encounter (Signed)
Pharmacy called and pt is needing an inhaler not the nebulizer.    [id number] - [place], [place] - [address], [place] [address] Phone: [phone]  Fax: [phone]

## 2023-05-19 NOTE — Addendum Note (Signed)
Addended by: Roxanne Gates on: 05/19/2023 04:53 PM   Modules accepted: Orders

## 2023-05-19 NOTE — Telephone Encounter (Signed)
Rx no longer on med list. Okay to send?

## 2023-05-19 NOTE — Telephone Encounter (Signed)
Pt called & stated that she has been out of albuterol (PROVENTIL) (2.5 MG/3ML) 0.083% nebulizer solution 2.5 mg   [[id number]] since Saturday, she requested to have her medication refilled quicker & sent to the pharmacy at [facility]  [address] [place], [place] [address]  Phone: [phone]. It was originally sent to [facility] pharmacy but will take 5 days. Please advise pt.

## 2023-05-19 NOTE — Telephone Encounter (Signed)
Correct Rx ssent

## 2023-05-19 NOTE — Addendum Note (Signed)
Addended by: Roxanne Gates on: 05/19/2023 02:51 PM   Modules accepted: Orders

## 2023-05-19 NOTE — Telephone Encounter (Signed)
Rx sent 

## 2023-05-20 MED ORDER — ALBUTEROL SULFATE HFA 108 (90 BASE) MCG/ACT IN AERS
2.0000 | INHALATION_SPRAY | Freq: Four times a day (QID) | RESPIRATORY_TRACT | 5 refills | Status: DC | PRN
Start: 2023-05-20 — End: 2023-10-16

## 2023-06-05 DIAGNOSIS — D1801 Hemangioma of skin and subcutaneous tissue: Secondary | ICD-10-CM | POA: Diagnosis not present

## 2023-06-05 DIAGNOSIS — L814 Other melanin hyperpigmentation: Secondary | ICD-10-CM | POA: Diagnosis not present

## 2023-06-05 DIAGNOSIS — L821 Other seborrheic keratosis: Secondary | ICD-10-CM | POA: Diagnosis not present

## 2023-06-12 ENCOUNTER — Other Ambulatory Visit: Payer: Self-pay | Admitting: Family Medicine

## 2023-06-12 DIAGNOSIS — J4520 Mild intermittent asthma, uncomplicated: Secondary | ICD-10-CM

## 2023-06-27 ENCOUNTER — Ambulatory Visit: Payer: Medicare HMO | Admitting: Hematology & Oncology

## 2023-06-27 ENCOUNTER — Other Ambulatory Visit: Payer: Medicare HMO

## 2023-07-03 ENCOUNTER — Other Ambulatory Visit: Payer: Self-pay | Admitting: *Deleted

## 2023-07-03 DIAGNOSIS — C50312 Malignant neoplasm of lower-inner quadrant of left female breast: Secondary | ICD-10-CM

## 2023-07-04 ENCOUNTER — Inpatient Hospital Stay: Payer: Medicare HMO | Attending: Hematology & Oncology

## 2023-07-04 ENCOUNTER — Other Ambulatory Visit: Payer: Self-pay

## 2023-07-04 ENCOUNTER — Ambulatory Visit (HOSPITAL_BASED_OUTPATIENT_CLINIC_OR_DEPARTMENT_OTHER)
Admission: RE | Admit: 2023-07-04 | Discharge: 2023-07-04 | Disposition: A | Discharge status: Home / Self Care | Payer: Medicare HMO | Source: Ambulatory Visit | Attending: Hematology & Oncology | Admitting: Hematology & Oncology

## 2023-07-04 ENCOUNTER — Inpatient Hospital Stay: Payer: Medicare HMO | Admitting: Hematology & Oncology

## 2023-07-04 VITALS — BP 132/75 | HR 106 | Temp 98.4°F | Resp 20 | Ht 64.0 in | Wt 148.0 lb

## 2023-07-04 DIAGNOSIS — J4521 Mild intermittent asthma with (acute) exacerbation: Secondary | ICD-10-CM | POA: Insufficient documentation

## 2023-07-04 DIAGNOSIS — C50912 Malignant neoplasm of unspecified site of left female breast: Secondary | ICD-10-CM | POA: Diagnosis not present

## 2023-07-04 DIAGNOSIS — Z170 Estrogen receptor positive status [ER+]: Secondary | ICD-10-CM

## 2023-07-04 DIAGNOSIS — Z79811 Long term (current) use of aromatase inhibitors: Secondary | ICD-10-CM | POA: Insufficient documentation

## 2023-07-04 DIAGNOSIS — Z87891 Personal history of nicotine dependence: Secondary | ICD-10-CM | POA: Insufficient documentation

## 2023-07-04 DIAGNOSIS — R062 Wheezing: Secondary | ICD-10-CM | POA: Diagnosis not present

## 2023-07-04 DIAGNOSIS — J449 Chronic obstructive pulmonary disease, unspecified: Secondary | ICD-10-CM | POA: Diagnosis not present

## 2023-07-04 DIAGNOSIS — K219 Gastro-esophageal reflux disease without esophagitis: Secondary | ICD-10-CM | POA: Diagnosis not present

## 2023-07-04 LAB — CBC WITH DIFFERENTIAL (CANCER CENTER ONLY)
Abs Immature Granulocytes: 0.02 10*3/uL (ref 0.00–0.07)
Basophils Absolute: 0 10*3/uL (ref 0.0–0.1)
Basophils Relative: 0 %
Eosinophils Absolute: 0.3 10*3/uL (ref 0.0–0.5)
Eosinophils Relative: 3 %
HCT: 41.9 % (ref 36.0–46.0)
Hemoglobin: 13.3 g/dL (ref 12.0–15.0)
Immature Granulocytes: 0 %
Lymphocytes Relative: 18 %
Lymphs Abs: 1.6 10*3/uL (ref 0.7–4.0)
MCH: 29.1 pg (ref 26.0–34.0)
MCHC: 31.7 g/dL (ref 30.0–36.0)
MCV: 91.7 fL (ref 80.0–100.0)
Monocytes Absolute: 0.8 10*3/uL (ref 0.1–1.0)
Monocytes Relative: 9 %
Neutro Abs: 6.4 10*3/uL (ref 1.7–7.7)
Neutrophils Relative %: 70 %
Platelet Count: 198 10*3/uL (ref 150–400)
RBC: 4.57 MIL/uL (ref 3.87–5.11)
RDW: 13.9 % (ref 11.5–15.5)
WBC Count: 9.2 10*3/uL (ref 4.0–10.5)
nRBC: 0 % (ref 0.0–0.2)

## 2023-07-04 LAB — CMP (CANCER CENTER ONLY)
ALT: 35 U/L (ref 0–44)
AST: 29 U/L (ref 15–41)
Albumin: 4.5 g/dL (ref 3.5–5.0)
Alkaline Phosphatase: 35 U/L — ABNORMAL LOW (ref 38–126)
Anion gap: 10 (ref 5–15)
BUN: 9 mg/dL (ref 8–23)
CO2: 29 mmol/L (ref 22–32)
Calcium: 9.9 mg/dL (ref 8.9–10.3)
Chloride: 103 mmol/L (ref 98–111)
Creatinine: 0.79 mg/dL (ref 0.44–1.00)
GFR, Estimated: >60 mL/min (ref >60)
Glucose, Bld: 99 mg/dL (ref 70–99)
Potassium: 4.4 mmol/L (ref 3.5–5.1)
Sodium: 142 mmol/L (ref 135–145)
Total Bilirubin: 0.4 mg/dL (ref 0.3–1.2)
Total Protein: 7 g/dL (ref 6.5–8.1)

## 2023-07-04 LAB — LACTATE DEHYDROGENASE: LDH: 191 U/L (ref 98–192)

## 2023-07-04 NOTE — Progress Notes (Signed)
Hematology and Oncology Follow Up Visit  Michelle Long [id number] August 08, 1946 77 y.o. 07/04/2023   Principle Diagnosis:  Stage IIA (T2 N0M0) infiltrating ductal carcinoma of the left breast Usual Ductal Hyperplasia -- Complex sclerosing    Current Therapy:   Tamoxifen 20 mg by mouth daily - finished in September 2017 Femara 2.5 mg p.o. daily --started on 04/02/2021      Interim History:  Michelle Long is here for follow-up.  Apparently, she is having problems with acid reflux.  Her daughter comes in with her.  I told her that this really is some of that her family doctor should be helping out with.  Regardless, I will go ahead and order a chest x-ray on her.  As far as her breast cancer is concerned, I think she is doing okay with this.  She is on Femara.  She has had no fever.  She has had no problems with COVID.  She was a smoker.  She has had no bleeding.  There is been no leg swelling.  She has had no rashes.  Overall, her performance status is probably ECOG 1.   Medications:  Allergies as of 07/04/2023       Reactions   Fluoxetine Other (See Comments)   seizure   Fluoxetine Hcl Other (See Comments)   seizure   Lamotrigine Itching, Other (See Comments)   itch   Sulfa Antibiotics Shortness Of Breath   Sulfa Antibiotics    Sulfa Drugs Cross Reactors Shortness Of Breath      Statins Other (See Comments)   Muscle pain Muscle pains, Muscle pains        Medication List        Accurate as of July 04, 2023  3:23 PM. If you have any questions, ask your nurse or doctor.          STOP taking these medications    amoxicillin-clavulanate 875-125 MG tablet Commonly known as: AUGMENTIN Stopped by: Josph Macho   Hair Skin and Nails Formula Tabs Stopped by: Josph Macho   multivitamin tablet Stopped by: Josph Macho       TAKE these medications    albuterol 108 (90 Base) MCG/ACT inhaler Commonly known as: VENTOLIN HFA Inhale 2 puffs into the  lungs every 6 (six) hours as needed for wheezing or shortness of breath. What changed: Another medication with the same name was removed. Continue taking this medication, and follow the directions you see here. Changed by: Josph Macho   alendronate 70 MG tablet Commonly known as: FOSAMAX TAKE 1 TABLET ONCE A WEEK WITH A FULL GLASS OF WATER ON AN EMPTY STOMACH   aspirin 81 MG tablet Take 81 mg by mouth daily.   budesonide-formoterol 160-4.5 MCG/ACT inhaler Commonly known as: Symbicort Inhale 2 puffs into the lungs 2 (two) times daily.   CALCIUM 1000 + D PO Take by mouth 2 (two) times daily.   citalopram 10 MG tablet Commonly known as: CELEXA Take 0.5 tablets (5 mg total) by mouth at bedtime.   DropSafe Alcohol Prep 70 % Pads To use before checking blood sugars   ezetimibe 10 MG tablet Commonly known as: ZETIA TAKE 1 TABLET EVERY DAY   letrozole 2.5 MG tablet Commonly known as: FEMARA TAKE 1 TABLET EVERY DAY   LORazepam 0.5 MG tablet Commonly known as: ATIVAN Take 0.5 mg by mouth daily as needed for anxiety. What changed: Another medication with the same name was removed. Continue taking this medication,  and follow the directions you see here. Changed by: Josph Macho   losartan 50 MG tablet Commonly known as: COZAAR TAKE 1 TABLET EVERY DAY   metFORMIN 1000 MG tablet Commonly known as: GLUCOPHAGE TAKE 1 TABLET TWICE DAILY WITH MEALS   MULTIVITAMIN ADULT PO Take 1 capsule by mouth daily at 6 (six) AM.   nateglinide 60 MG tablet Commonly known as: STARLIX Take 1 tablet (60 mg total) by mouth 2 (two) times daily with a meal.   OLANZapine 5 MG tablet Commonly known as: ZYPREXA 1/2 tablet daily What changed:  how much to take how to take this when to take this additional instructions   pantoprazole 40 MG tablet Commonly known as: PROTONIX TAKE 1 TABLET EVERY DAY   rosuvastatin 5 MG tablet Commonly known as: CRESTOR TAKE 1 TABLET THREE TIMES  WEEKLY   Spacer/Aero Chamber Mouthpiece Misc To use with inhaler   True Metrix Air Glucose Meter w/Device Kit Check blood sugars once daily   True Metrix Blood Glucose Test test strip Generic drug: glucose blood TEST BLOOD SUGAR EVERY DAY   True Metrix Level 1 Low Soln USE AS DIRECTED WITH GLUCOSE METER   TRUEplus Lancets 33G Misc TEST BLOOD SUGAR EVERY DAY   Vitamin D 50 MCG (2000 UT) Caps Take 2,000 Units by mouth daily.        Allergies:  Allergies  Allergen Reactions   Fluoxetine Other (See Comments)    seizure   Fluoxetine Hcl Other (See Comments)    seizure   Lamotrigine Itching and Other (See Comments)    itch   Sulfa Antibiotics Shortness Of Breath   Sulfa Antibiotics    Sulfa Drugs Cross Reactors Shortness Of Breath        Statins Other (See Comments)    Muscle pain  Muscle pains, Muscle pains    Past Medical History, Surgical history, Social history, and Family History were reviewed and updated.  Review of Systems: Review of Systems  Constitutional: Negative.   HENT: Negative.    Eyes: Negative.   Respiratory: Negative.    Cardiovascular: Negative.   Gastrointestinal: Negative.   Genitourinary: Negative.   Musculoskeletal: Negative.   Skin: Negative.   Neurological: Negative.   Endo/Heme/Allergies: Negative.   Psychiatric/Behavioral: Negative.       Physical Exam:  height is 5\' 4"  (1.626 m) and weight is 148 lb (67.1 kg). Her oral temperature is 98.4 F (36.9 C). Her blood pressure is 132/75 and her pulse is 106 (abnormal). Her respiration is 20 and oxygen saturation is 94%.   Wt Readings from Last 3 Encounters:  07/04/23 148 lb (67.1 kg)  04/15/23 150 lb 12.8 oz (68.4 kg)  03/28/23 149 lb 12.8 oz (67.9 kg)    Physical Exam Vitals reviewed.  Constitutional:      Comments: Her breast exam shows her right breast with no masses, edema or erythema. There is no right axillary adenopathy. Left breast is somewhat contracted from surgery  and radiation. She has a well-healed lumpectomy scar at the 6:00 position. There is some slight firmness at the lumpectomy site. There is no distinct mass. There is no left axillary adenopathy.  HENT:     Head: Normocephalic and atraumatic.  Eyes:     Pupils: Pupils are equal, round, and reactive to light.  Cardiovascular:     Rate and Rhythm: Normal rate and regular rhythm.     Heart sounds: Normal heart sounds.  Pulmonary:     Effort: Pulmonary effort  is normal.     Breath sounds: Normal breath sounds.  Abdominal:     General: Bowel sounds are normal.     Palpations: Abdomen is soft.  Musculoskeletal:        General: No tenderness or deformity. Normal range of motion.     Cervical back: Normal range of motion.  Lymphadenopathy:     Cervical: No cervical adenopathy.  Skin:    General: Skin is warm and dry.     Findings: No erythema or rash.  Neurological:     Mental Status: She is alert and oriented to person, place, and time.  Psychiatric:        Behavior: Behavior normal.        Thought Content: Thought content normal.        Judgment: Judgment normal.   Lab Results  Component Value Date   WBC 9.2 07/04/2023   HGB 13.3 07/04/2023   HCT 41.9 07/04/2023   MCV 91.7 07/04/2023   PLT 198 07/04/2023   No results found for: "FERRITIN", "IRON", "TIBC", "UIBC", "IRONPCTSAT" Lab Results  Component Value Date   RBC 4.57 07/04/2023   No results found for: "KPAFRELGTCHN", "LAMBDASER", "KAPLAMBRATIO" No results found for: "IGGSERUM", "IGA", "IGMSERUM" No results found for: "TOTALPROTELP", "ALBUMINELP", "A1GS", "A2GS", "BETS", "BETA2SER", "GAMS", "MSPIKE", "SPEI"   Chemistry      Component Value Date/Time   NA 142 07/04/2023 1417   NA 139 07/02/2017 1445   NA 142 01/01/2017 1059   K 4.4 07/04/2023 1417   K 4.6 07/02/2017 1445   K 4.6 01/01/2017 1059   CL 103 07/04/2023 1417   CL 103 07/02/2017 1445   CL 105 01/20/2015 1240   CO2 29 07/04/2023 1417   CO2 28 07/02/2017  1445   CO2 26 01/01/2017 1059   BUN 9 07/04/2023 1417   BUN 10 07/02/2017 1445   BUN 10.6 01/01/2017 1059   CREATININE 0.79 07/04/2023 1417   CREATININE 0.68 07/17/2020 1105   CREATININE 0.8 01/01/2017 1059      Component Value Date/Time   CALCIUM 9.9 07/04/2023 1417   CALCIUM 10.3 07/02/2017 1445   CALCIUM 10.1 01/01/2017 1059   ALKPHOS 35 (L) 07/04/2023 1417   ALKPHOS 47 07/02/2017 1445   ALKPHOS 49 01/01/2017 1059   AST 29 07/04/2023 1417   AST 60 (H) 01/01/2017 1059   ALT 35 07/04/2023 1417   ALT 63 (H) 01/01/2017 1059   BILITOT 0.4 07/04/2023 1417   BILITOT 0.64 01/01/2017 1059     Impression and Plan: Ms. Sedlar is a most delightful 77 year old postmenopausal white female. She had stage IIA ductal carcinoma the left breast. She completed tamoxifen in September 2017.  Everything looks fine from my point of view.  I do not see anything that looks suspicious.  She continues on the Femara.  Again, her problem is this acid reflux.  Hopefully, she will be able to take care of this with her family doctor.  I think she does have a Solicitor.  I am surprised that she does not have a Pulmonologist given that she has underlying COPD.  We will see what the chest x-ray shows.  I will plan to see her back in another 3 months.  Given her lung issues, we probably need to follow-up with her little bit more sooner.    Josph Macho, MD 9/13/20243:23 PM

## 2023-07-16 ENCOUNTER — Other Ambulatory Visit (INDEPENDENT_AMBULATORY_CARE_PROVIDER_SITE_OTHER): Payer: Medicare HMO

## 2023-07-16 ENCOUNTER — Telehealth: Payer: Self-pay

## 2023-07-16 DIAGNOSIS — I10 Essential (primary) hypertension: Secondary | ICD-10-CM | POA: Diagnosis not present

## 2023-07-16 DIAGNOSIS — E1165 Type 2 diabetes mellitus with hyperglycemia: Secondary | ICD-10-CM | POA: Diagnosis not present

## 2023-07-16 DIAGNOSIS — E1169 Type 2 diabetes mellitus with other specified complication: Secondary | ICD-10-CM

## 2023-07-16 DIAGNOSIS — E785 Hyperlipidemia, unspecified: Secondary | ICD-10-CM | POA: Diagnosis not present

## 2023-07-16 LAB — COMPREHENSIVE METABOLIC PANEL
ALT: 32 U/L (ref 0–35)
AST: 29 U/L (ref 0–37)
Albumin: 4.1 g/dL (ref 3.5–5.2)
Alkaline Phosphatase: 35 U/L — ABNORMAL LOW (ref 39–117)
BUN: 12 mg/dL (ref 6–23)
CO2: 28 mEq/L (ref 19–32)
Calcium: 9.6 mg/dL (ref 8.4–10.5)
Chloride: 103 mEq/L (ref 96–112)
Creatinine, Ser: 0.79 mg/dL (ref 0.40–1.20)
GFR: 72.22 mL/min (ref >60.00)
Glucose, Bld: 102 mg/dL — ABNORMAL HIGH (ref 70–99)
Potassium: 4.5 mEq/L (ref 3.5–5.1)
Sodium: 142 mEq/L (ref 135–145)
Total Bilirubin: 0.4 mg/dL (ref 0.2–1.2)
Total Protein: 6.5 g/dL (ref 6.0–8.3)

## 2023-07-16 LAB — LIPID PANEL
Cholesterol: 142 mg/dL (ref 0–200)
HDL: 56.4 mg/dL (ref >39.00)
LDL Cholesterol: 65 mg/dL (ref 0–99)
NonHDL: 85.75
Total CHOL/HDL Ratio: 3
Triglycerides: 103 mg/dL (ref 0.0–149.0)
VLDL: 20.6 mg/dL (ref 0.0–40.0)

## 2023-07-16 LAB — CBC WITH DIFFERENTIAL/PLATELET
Basophils Absolute: 0.1 10*3/uL (ref 0.0–0.1)
Basophils Relative: 1.3 % (ref 0.0–3.0)
Eosinophils Absolute: 0.2 10*3/uL (ref 0.0–0.7)
Eosinophils Relative: 3.4 % (ref 0.0–5.0)
HCT: 42.4 % (ref 36.0–46.0)
Hemoglobin: 13.3 g/dL (ref 12.0–15.0)
Lymphocytes Relative: 29.3 % (ref 12.0–46.0)
Lymphs Abs: 1.8 10*3/uL (ref 0.7–4.0)
MCHC: 31.4 g/dL (ref 30.0–36.0)
MCV: 90.6 fl (ref 78.0–100.0)
Monocytes Absolute: 0.4 10*3/uL (ref 0.1–1.0)
Monocytes Relative: 6.9 % (ref 3.0–12.0)
Neutro Abs: 3.7 10*3/uL (ref 1.4–7.7)
Neutrophils Relative %: 59.1 % (ref 43.0–77.0)
Platelets: 192 10*3/uL (ref 150.0–400.0)
RBC: 4.69 Mil/uL (ref 3.87–5.11)
RDW: 14.9 % (ref 11.5–15.5)
WBC: 6.2 10*3/uL (ref 4.0–10.5)

## 2023-07-16 LAB — HEMOGLOBIN A1C: Hgb A1c MFr Bld: 6.8 % — ABNORMAL HIGH (ref 4.6–6.5)

## 2023-07-16 NOTE — Telephone Encounter (Signed)
Advised via MyChart.

## 2023-07-16 NOTE — Telephone Encounter (Signed)
-----   Message from Josph Macho sent at 07/15/2023  7:49 PM EDT ----- Call - the CXR is normal!!!  Cindee Lame

## 2023-08-21 ENCOUNTER — Ambulatory Visit
Admission: RE | Admit: 2023-08-21 | Discharge: 2023-08-21 | Disposition: A | Discharge status: Home / Self Care | Payer: Medicare HMO | Source: Ambulatory Visit | Attending: Hematology & Oncology | Admitting: Hematology & Oncology

## 2023-08-21 DIAGNOSIS — Z853 Personal history of malignant neoplasm of breast: Secondary | ICD-10-CM | POA: Diagnosis not present

## 2023-08-21 DIAGNOSIS — R921 Mammographic calcification found on diagnostic imaging of breast: Secondary | ICD-10-CM

## 2023-08-25 DIAGNOSIS — L308 Other specified dermatitis: Secondary | ICD-10-CM | POA: Diagnosis not present

## 2023-09-02 ENCOUNTER — Other Ambulatory Visit: Payer: Self-pay | Admitting: Family Medicine

## 2023-09-02 DIAGNOSIS — E2839 Other primary ovarian failure: Secondary | ICD-10-CM

## 2023-09-15 ENCOUNTER — Other Ambulatory Visit: Payer: Self-pay | Admitting: Family Medicine

## 2023-09-26 ENCOUNTER — Telehealth: Payer: Self-pay

## 2023-09-26 NOTE — Transitions of Care (Post Inpatient/ED Visit) (Signed)
   09/26/2023  Name: Michelle Long MRN: [id number] DOB: 10/06/1946  Today's TOC FU Call Status: Today's TOC FU Call Status:: Successful TOC FU Call Completed  Attempted to reach the patient regarding the most recent Inpatient/ED visit.  Follow Up Plan: No further outreach attempts will be made at this time. We have been unable to contact the patient. Patient states has  not been in ER Signature Karena Addison, LPN [facility] Nurse Health Advisor Direct Dial [phone]

## 2023-09-29 ENCOUNTER — Ambulatory Visit: Payer: Medicare HMO | Admitting: Family Medicine

## 2023-09-30 ENCOUNTER — Encounter: Payer: Self-pay | Admitting: Family Medicine

## 2023-09-30 ENCOUNTER — Ambulatory Visit (INDEPENDENT_AMBULATORY_CARE_PROVIDER_SITE_OTHER): Payer: Medicare HMO | Admitting: Family Medicine

## 2023-09-30 VITALS — BP 132/68 | HR 96 | Temp 97.8°F | Resp 18 | Ht 64.0 in | Wt 148.8 lb

## 2023-09-30 DIAGNOSIS — E1169 Type 2 diabetes mellitus with other specified complication: Secondary | ICD-10-CM | POA: Diagnosis not present

## 2023-09-30 DIAGNOSIS — E1165 Type 2 diabetes mellitus with hyperglycemia: Secondary | ICD-10-CM

## 2023-09-30 DIAGNOSIS — E785 Hyperlipidemia, unspecified: Secondary | ICD-10-CM | POA: Diagnosis not present

## 2023-09-30 DIAGNOSIS — I10 Essential (primary) hypertension: Secondary | ICD-10-CM

## 2023-09-30 DIAGNOSIS — J4520 Mild intermittent asthma, uncomplicated: Secondary | ICD-10-CM

## 2023-09-30 LAB — CBC WITH DIFFERENTIAL/PLATELET
Basophils Absolute: 0 10*3/uL (ref 0.0–0.1)
Basophils Relative: 0.5 % (ref 0.0–3.0)
Eosinophils Absolute: 0.2 10*3/uL (ref 0.0–0.7)
Eosinophils Relative: 3.1 % (ref 0.0–5.0)
HCT: 40.5 % (ref 36.0–46.0)
Hemoglobin: 12.9 g/dL (ref 12.0–15.0)
Lymphocytes Relative: 25.1 % (ref 12.0–46.0)
Lymphs Abs: 1.3 10*3/uL (ref 0.7–4.0)
MCHC: 31.9 g/dL (ref 30.0–36.0)
MCV: 90.6 fL (ref 78.0–100.0)
Monocytes Absolute: 0.5 10*3/uL (ref 0.1–1.0)
Monocytes Relative: 10.3 % (ref 3.0–12.0)
Neutro Abs: 3.2 10*3/uL (ref 1.4–7.7)
Neutrophils Relative %: 61 % (ref 43.0–77.0)
Platelets: 188 10*3/uL (ref 150.0–400.0)
RBC: 4.47 Mil/uL (ref 3.87–5.11)
RDW: 14.8 % (ref 11.5–15.5)
WBC: 5.3 10*3/uL (ref 4.0–10.5)

## 2023-09-30 LAB — LIPID PANEL
Cholesterol: 149 mg/dL (ref 0–200)
HDL: 47.7 mg/dL (ref >39.00)
LDL Cholesterol: 75 mg/dL (ref 0–99)
NonHDL: 101.67
Total CHOL/HDL Ratio: 3
Triglycerides: 133 mg/dL (ref 0.0–149.0)
VLDL: 26.6 mg/dL (ref 0.0–40.0)

## 2023-09-30 LAB — COMPREHENSIVE METABOLIC PANEL
ALT: 44 U/L — ABNORMAL HIGH (ref 0–35)
AST: 42 U/L — ABNORMAL HIGH (ref 0–37)
Albumin: 4.2 g/dL (ref 3.5–5.2)
Alkaline Phosphatase: 36 U/L — ABNORMAL LOW (ref 39–117)
BUN: 10 mg/dL (ref 6–23)
CO2: 30 meq/L (ref 19–32)
Calcium: 9.4 mg/dL (ref 8.4–10.5)
Chloride: 103 meq/L (ref 96–112)
Creatinine, Ser: 0.77 mg/dL (ref 0.40–1.20)
GFR: 74.37 mL/min (ref >60.00)
Glucose, Bld: 135 mg/dL — ABNORMAL HIGH (ref 70–99)
Potassium: 4.6 meq/L (ref 3.5–5.1)
Sodium: 142 meq/L (ref 135–145)
Total Bilirubin: 0.5 mg/dL (ref 0.2–1.2)
Total Protein: 6.5 g/dL (ref 6.0–8.3)

## 2023-09-30 LAB — MICROALBUMIN / CREATININE URINE RATIO
Creatinine,U: 145.1 mg/dL
Microalb Creat Ratio: 1.3 mg/g (ref 0.0–30.0)
Microalb, Ur: 1.8 mg/dL (ref 0.0–1.9)

## 2023-09-30 LAB — HEMOGLOBIN A1C: Hgb A1c MFr Bld: 6.7 % — ABNORMAL HIGH (ref 4.6–6.5)

## 2023-09-30 NOTE — Progress Notes (Signed)
Established Patient Office Visit  Subjective   Patient ID: Michelle Long, female    DOB: 1946/07/12  Age: 77 y.o. MRN: [id number]  Chief Complaint  Patient presents with   Diabetes   Hyperlipidemia   Hypertension   Follow-up    HPI Discussed the use of AI scribe software for clinical note transcription with the patient, who gave verbal consent to proceed.  History of Present Illness   The patient, with a history of diabetes and hypertension, presents with a recent onset of a dry cough, occasionally productive of white sputum. The symptoms started a few days prior to the consultation. She denies any associated fever or nasal drainage. The patient had a confirmed COVID-19 infection in October, which did not involve the lungs and was described as a mild case.  The patient's blood glucose levels have been elevated recently, with a reading of 131 on the day of the consultation despite a modest dinner the previous night. She expresses concern about her upcoming HbA1c results, anticipating they might be unfavorable.  The patient is up-to-date with her vaccinations, having received both the flu shot and the shingles vaccine. She also mentions a scheduled appointment with an eye doctor. No need for medication refills was expressed during the consultation.      Patient Active Problem List   Diagnosis Date Noted   Cardiomyopathy due to chemotherapy (HCC) 03/23/2021   Thrombocytopenia (HCC) 03/23/2021   Porokeratosis 09/01/2019   Uncontrolled type 2 diabetes mellitus with hyperglycemia (HCC) 01/08/2019   Hyperlipidemia associated with type 2 diabetes mellitus (HCC) 01/08/2019   Impacted cerumen of left ear 12/10/2016   Essential hypertension 05/16/2016   Contact dermatitis 02/17/2015   Cancer of lower-inner quadrant of left female breast (HCC) 12/08/2013   Estrogen deficiency 10/22/2013   Athlete's foot 04/03/2013   Elevated BP 11/05/2012   Grief 10/06/2012   Fatigue 02/13/2012   Easy  bruising 09/25/2011   Leg cramps 08/17/2011   Arm swelling 08/16/2011   Lower urinary tract infectious disease 06/04/2011   Vitamin D deficiency 05/29/2011   Preventative health care 05/29/2011   Hx of abnormal cervical Pap smear 05/29/2011   Dysuria 05/15/2011   Dry skin 05/15/2011   Asthma    Bladder cancer (HCC)    Bipolar disorder (HCC)    Inadequately controlled diabetes mellitus (HCC)    GERD (gastroesophageal reflux disease)    Past Medical History:  Diagnosis Date   Anxiety    Asthma    Bipolar disorder (HCC)    Bladder cancer (HCC)    Breast cancer (HCC)    Cancer of lower-inner quadrant of left female breast (HCC) 12/08/2013   COPD (chronic obstructive pulmonary disease) (HCC)    former smoker, quit 1999   Depression    Diabetes mellitus    GERD (gastroesophageal reflux disease)    Hepatitis B infection    History of transfusion of whole blood    Approximately 2009, due to breast cancer/chemo.   Hyperlipidemia    Hypertension    Personal history of chemotherapy    Personal history of radiation therapy    PONV (postoperative nausea and vomiting)    Past Surgical History:  Procedure Laterality Date   ABDOMINAL HYSTERECTOMY     APPENDECTOMY  1961   bladder cancer  2006   BREAST LUMPECTOMY Left 2007   BREAST LUMPECTOMY WITH RADIOACTIVE SEED LOCALIZATION Right 03/09/2021   Procedure: RIGHT BREAST LUMPECTOMY WITH RADIOACTIVE SEED LOCALIZATION;  Surgeon: Griselda Miner, MD;  Location:  [facility];  Service: General;  Laterality: Right;   PARTIAL HYSTERECTOMY  1991   TONSILLECTOMY  1951   Social History   Tobacco Use   Smoking status: Former    Current packs/day: 0.00    Average packs/day: 1.5 packs/day for 33.7 years (50.6 ttl pk-yrs)    Types: Cigarettes    Start date: 12/08/1964    Quit date: 08/21/1998    Years since quitting: 25.1   Smokeless tobacco: Never   Tobacco comments:    quit 16 years ago  Vaping Use   Vaping status: Never Used   Substance Use Topics   Alcohol use: Not Currently    Alcohol/week: 0.0 standard drinks of alcohol   Drug use: No   Social History   Socioeconomic History   Marital status: Divorced    Spouse name: Not on file   Number of children: Not on file   Years of education: Not on file   Highest education level: Not on file  Occupational History   Occupation: retired    Comment: retired  Tobacco Use   Smoking status: Former    Current packs/day: 0.00    Average packs/day: 1.5 packs/day for 33.7 years (50.6 ttl pk-yrs)    Types: Cigarettes    Start date: 12/08/1964    Quit date: 08/21/1998    Years since quitting: 25.1   Smokeless tobacco: Never   Tobacco comments:    quit 16 years ago  Vaping Use   Vaping status: Never Used  Substance and Sexual Activity   Alcohol use: Not Currently    Alcohol/week: 0.0 standard drinks of alcohol   Drug use: No   Sexual activity: Not Currently    Partners: Male    Birth control/protection: Surgical  Other Topics Concern   Not on file  Social History Narrative   Exercise--- no   Social Determinants of Health   Financial Resource Strain: Low Risk  (10/01/2021)   Overall Financial Resource Strain (CARDIA)    Difficulty of Paying Living Expenses: Not hard at all  Food Insecurity: No Food Insecurity (10/03/2022)   Hunger Vital Sign    Worried About Running Out of Food in the Last Year: Never true    Ran Out of Food in the Last Year: Never true  Transportation Needs: No Transportation Needs (10/03/2022)   PRAPARE - Administrator, Civil Service (Medical): No    Lack of Transportation (Non-Medical): No  Physical Activity: Sufficiently Active (09/28/2020)   Exercise Vital Sign    Days of Exercise per Week: 3 days    Minutes of Exercise per Session: 60 min  Stress: No Stress Concern Present (10/01/2021)   [employer] of Occupational Health - Occupational Stress Questionnaire    Feeling of Stress : Not at all  Social  Connections: Unknown (03/03/2022)   [facility]   Social Network    Social Network: Not on file  Intimate Partner Violence: Not At Risk (10/03/2022)   Humiliation, Afraid, Rape, and Kick questionnaire    Fear of Current or Ex-Partner: No    Emotionally Abused: No    Physically Abused: No    Sexually Abused: No   Family Status  Relation Name Status   Mother  Deceased at age 73       natural causes   Father  Deceased at age 34       heart disease. cancer   Sister  Alive   Sister  Alive  No partnership data on file   Family History  Problem Relation Age of Onset   Arthritis Mother    Breast cancer Mother    Transient ischemic attack Mother    Hypertension Mother    Heart disease Father    Diabetes Father    Cancer Father        bladder cancer   Throat cancer Father    Breast cancer Sister    Allergies  Allergen Reactions   Fluoxetine Other (See Comments)    seizure   Fluoxetine Hcl Other (See Comments)    seizure   Lamotrigine Itching and Other (See Comments)    itch   Sulfa Antibiotics Shortness Of Breath   Sulfa Antibiotics    Sulfa Drugs Cross Reactors Shortness Of Breath        Statins Other (See Comments)    Muscle pain  Muscle pains, Muscle pains      ROS    Objective:     BP 132/68 (BP Location: Right Arm, Patient Position: Sitting, Cuff Size: Normal)   Pulse 96   Temp 97.8 F (36.6 C) (Oral)   Resp 18   Ht 5\' 4"  (1.626 m)   Wt 148 lb 12.8 oz (67.5 kg)   SpO2 96%   BMI 25.54 kg/m  BP Readings from Last 3 Encounters:  09/30/23 132/68  07/04/23 132/75  04/15/23 130/60   Wt Readings from Last 3 Encounters:  09/30/23 148 lb 12.8 oz (67.5 kg)  07/04/23 148 lb (67.1 kg)  04/15/23 150 lb 12.8 oz (68.4 kg)   SpO2 Readings from Last 3 Encounters:  09/30/23 96%  07/04/23 94%  04/15/23 95%      Physical Exam Vitals and nursing note reviewed.  Constitutional:      General: She is not in acute distress.     Appearance: Normal appearance. She is well-developed.  HENT:     Head: Normocephalic and atraumatic.  Eyes:     General: No scleral icterus.       Right eye: No discharge.        Left eye: No discharge.  Cardiovascular:     Rate and Rhythm: Normal rate and regular rhythm.     Heart sounds: No murmur heard. Pulmonary:     Effort: Pulmonary effort is normal. No respiratory distress.     Breath sounds: Normal breath sounds.  Musculoskeletal:        General: Normal range of motion.     Cervical back: Normal range of motion and neck supple.     Right lower leg: No edema.     Left lower leg: No edema.  Skin:    General: Skin is warm and dry.  Neurological:     General: No focal deficit present.     Mental Status: She is alert and oriented to person, place, and time.  Psychiatric:        Mood and Affect: Mood normal.        Behavior: Behavior normal.        Thought Content: Thought content normal.        Judgment: Judgment normal.      No results found for any visits on 09/30/23.  Last CBC Lab Results  Component Value Date   WBC 6.2 07/16/2023   HGB 13.3 07/16/2023   HCT 42.4 07/16/2023   MCV 90.6 07/16/2023   MCH 29.1 07/04/2023   RDW 14.9 07/16/2023   PLT 192.0 07/16/2023   Last metabolic panel Lab Results  Component Value Date   GLUCOSE 102 (H) 07/16/2023   NA 142 07/16/2023   K 4.5 07/16/2023   CL 103 07/16/2023   CO2 28 07/16/2023   BUN 12 07/16/2023   CREATININE 0.79 07/16/2023   GFR 72.22 07/16/2023   CALCIUM 9.6 07/16/2023   PROT 6.5 07/16/2023   ALBUMIN 4.1 07/16/2023   LABGLOB 2.8 07/02/2017   AGRATIO 1.6 07/02/2017   BILITOT 0.4 07/16/2023   ALKPHOS 35 (L) 07/16/2023   AST 29 07/16/2023   ALT 32 07/16/2023   ANIONGAP 10 07/04/2023   Last lipids Lab Results  Component Value Date   CHOL 142 07/16/2023   HDL 56.40 07/16/2023   LDLCALC 65 07/16/2023   LDLDIRECT 145.6 06/22/2013   TRIG 103.0 07/16/2023   CHOLHDL 3 07/16/2023   Last  hemoglobin A1c Lab Results  Component Value Date   HGBA1C 6.8 (H) 07/16/2023   Last thyroid functions Lab Results  Component Value Date   TSH 0.80 01/30/2015   Last vitamin D Lab Results  Component Value Date   VD25OH 67.63 12/25/2022   Last vitamin B12 and Folate No results found for: "VITAMINB12", "FOLATE"    The 10-year ASCVD risk score (Arnett DK, et al., 2019) is: 45.7%    Assessment & Plan:   Problem List Items Addressed This Visit       Unprioritized   Asthma - Primary   Hyperlipidemia associated with type 2 diabetes mellitus (HCC)   Relevant Orders   Lipid panel   Comprehensive metabolic panel   Other Visit Diagnoses     Type 2 diabetes mellitus with hyperglycemia, without long-term current use of insulin (HCC)       Relevant Orders   Lipid panel   CBC with Differential/Platelet   Comprehensive metabolic panel   Hemoglobin A1c   Microalbumin / creatinine urine ratio   Primary hypertension       Relevant Orders   CBC with Differential/Platelet   Comprehensive metabolic panel     Assessment and Plan    Cough   She presents with a dry cough and occasional white sputum for the past few days, without fever or drainage. Considering her mild COVID-19 infection in October, the differential diagnosis leans towards a viral upper respiratory infection. We will recommend Mucinex and advise increased water intake. She is instructed to return if symptoms worsen, change color, or if fever develops.  Diabetes Mellitus   Her blood sugar level today is 131 mg/dL, raising concerns about a potential elevated HbA1c post-Thanksgiving. We discussed the importance of regular monitoring and dietary adjustments. An HbA1c test will be ordered, and she is to monitor blood sugar levels regularly.  General Health Maintenance   She has received the flu shot and both doses of the shingles vaccine, with an eye doctor appointment scheduled for the 16th. We encourage keeping the eye  doctor appointment and continuing routine vaccinations and screenings.  Follow-up   We will follow up with lab results. She should return if symptoms worsen or new symptoms develop.        No follow-ups on file.    Donato Schultz, DO

## 2023-10-06 DIAGNOSIS — H524 Presbyopia: Secondary | ICD-10-CM | POA: Diagnosis not present

## 2023-10-06 LAB — HM DIABETES EYE EXAM

## 2023-10-07 DIAGNOSIS — F250 Schizoaffective disorder, bipolar type: Secondary | ICD-10-CM | POA: Diagnosis not present

## 2023-10-07 DIAGNOSIS — F419 Anxiety disorder, unspecified: Secondary | ICD-10-CM | POA: Diagnosis not present

## 2023-10-08 ENCOUNTER — Encounter: Payer: Self-pay | Admitting: Family Medicine

## 2023-10-09 ENCOUNTER — Ambulatory Visit: Payer: Medicare HMO | Admitting: *Deleted

## 2023-10-09 DIAGNOSIS — Z0000 Encounter for general adult medical examination without abnormal findings: Secondary | ICD-10-CM

## 2023-10-09 NOTE — Patient Instructions (Signed)
Michelle Long , Thank you for taking time to come for your Medicare Wellness Visit. I appreciate your ongoing commitment to your health goals. Please review the following plan we discussed and let me know if I can assist you in the future.     This is a list of the screening recommended for you and due dates:  Health Maintenance  Topic Date Due   Zoster (Shingles) Vaccine (2 of 2) 09/19/2019   COVID-19 Vaccine (7 - 2024-25 season) 09/05/2023   Eye exam for diabetics  10/04/2023   Complete foot exam   03/27/2024   Hemoglobin A1C  03/30/2024   Yearly kidney function blood test for diabetes  09/29/2024   Yearly kidney health urinalysis for diabetes  09/29/2024   Medicare Annual Wellness Visit  10/08/2024   DTaP/Tdap/Td vaccine (4 - Td or Tdap) 09/19/2025   Colon Cancer Screening  12/17/2027   Pneumonia Vaccine  Completed   Flu Shot  Completed   DEXA scan (bone density measurement)  Completed   Hepatitis C Screening  Completed   HPV Vaccine  Aged Out    Next appointment: Follow up in one year for your annual wellness visit.  Preventive Care 46 Years and Older, Female Preventive care refers to lifestyle choices and visits with your health care provider that can promote health and wellness. What does preventive care include? A yearly physical exam. This is also called an annual well check. Dental exams once or twice a year. Routine eye exams. Ask your health care provider how often you should have your eyes checked. Personal lifestyle choices, including: Daily care of your teeth and gums. Regular physical activity. Eating a healthy diet. Avoiding tobacco and drug use. Limiting alcohol use. Practicing safe sex. Taking low-dose aspirin every day. Taking vitamin and mineral supplements as recommended by your health care provider. What happens during an annual well check? The services and screenings done by your health care provider during your annual well check will depend on your age,  overall health, lifestyle risk factors, and family history of disease. Counseling  Your health care provider may ask you questions about your: Alcohol use. Tobacco use. Drug use. Emotional well-being. Home and relationship well-being. Sexual activity. Eating habits. History of falls. Memory and ability to understand (cognition). Work and work Astronomer. Reproductive health. Screening  You may have the following tests or measurements: Height, weight, and BMI. Blood pressure. Lipid and cholesterol levels. These may be checked every 5 years, or more frequently if you are over 22 years old. Skin check. Lung cancer screening. You may have this screening every year starting at age 39 if you have a 30-pack-year history of smoking and currently smoke or have quit within the past 15 years. Fecal occult blood test (FOBT) of the stool. You may have this test every year starting at age 8. Flexible sigmoidoscopy or colonoscopy. You may have a sigmoidoscopy every 5 years or a colonoscopy every 10 years starting at age 27. Hepatitis C blood test. Hepatitis B blood test. Sexually transmitted disease (STD) testing. Diabetes screening. This is done by checking your blood sugar (glucose) after you have not eaten for a while (fasting). You may have this done every 1-3 years. Bone density scan. This is done to screen for osteoporosis. You may have this done starting at age 30. Mammogram. This may be done every 1-2 years. Talk to your health care provider about how often you should have regular mammograms. Talk with your health care provider about your  test results, treatment options, and if necessary, the need for more tests. Vaccines  Your health care provider may recommend certain vaccines, such as: Influenza vaccine. This is recommended every year. Tetanus, diphtheria, and acellular pertussis (Tdap, Td) vaccine. You may need a Td booster every 10 years. Zoster vaccine. You may need this after age  49. Pneumococcal 13-valent conjugate (PCV13) vaccine. One dose is recommended after age 31. Pneumococcal polysaccharide (PPSV23) vaccine. One dose is recommended after age 105. Talk to your health care provider about which screenings and vaccines you need and how often you need them. This information is not intended to replace advice given to you by your health care provider. Make sure you discuss any questions you have with your health care provider. Document Released: 11/03/2015 Document Revised: 06/26/2016 Document Reviewed: 08/08/2015 Elsevier Interactive Patient Education  2017 [employer].  Fall Prevention in the Home Falls can cause injuries. They can happen to people of all ages. There are many things you can do to make your home safe and to help prevent falls. What can I do on the outside of my home? Regularly fix the edges of walkways and driveways and fix any cracks. Remove anything that might make you trip as you walk through a door, such as a raised step or threshold. Trim any bushes or trees on the path to your home. Use bright outdoor lighting. Clear any walking paths of anything that might make someone trip, such as rocks or tools. Regularly check to see if handrails are loose or broken. Make sure that both sides of any steps have handrails. Any raised decks and porches should have guardrails on the edges. Have any leaves, snow, or ice cleared regularly. Use sand or salt on walking paths during winter. Clean up any spills in your garage right away. This includes oil or grease spills. What can I do in the bathroom? Use night lights. Install grab bars by the toilet and in the tub and shower. Do not use towel bars as grab bars. Use non-skid mats or decals in the tub or shower. If you need to sit down in the shower, use a plastic, non-slip stool. Keep the floor dry. Clean up any water that spills on the floor as soon as it happens. Remove soap buildup in the tub or shower  regularly. Attach bath mats securely with double-sided non-slip rug tape. Do not have throw rugs and other things on the floor that can make you trip. What can I do in the bedroom? Use night lights. Make sure that you have a light by your bed that is easy to reach. Do not use any sheets or blankets that are too big for your bed. They should not hang down onto the floor. Have a firm chair that has side arms. You can use this for support while you get dressed. Do not have throw rugs and other things on the floor that can make you trip. What can I do in the kitchen? Clean up any spills right away. Avoid walking on wet floors. Keep items that you use a lot in easy-to-reach places. If you need to reach something above you, use a strong step stool that has a grab bar. Keep electrical cords out of the way. Do not use floor polish or wax that makes floors slippery. If you must use wax, use non-skid floor wax. Do not have throw rugs and other things on the floor that can make you trip. What can I do with my  stairs? Do not leave any items on the stairs. Make sure that there are handrails on both sides of the stairs and use them. Fix handrails that are broken or loose. Make sure that handrails are as long as the stairways. Check any carpeting to make sure that it is firmly attached to the stairs. Fix any carpet that is loose or worn. Avoid having throw rugs at the top or bottom of the stairs. If you do have throw rugs, attach them to the floor with carpet tape. Make sure that you have a light switch at the top of the stairs and the bottom of the stairs. If you do not have them, ask someone to add them for you. What else can I do to help prevent falls? Wear shoes that: Do not have high heels. Have rubber bottoms. Are comfortable and fit you well. Are closed at the toe. Do not wear sandals. If you use a stepladder: Make sure that it is fully opened. Do not climb a closed stepladder. Make sure that  both sides of the stepladder are locked into place. Ask someone to hold it for you, if possible. Clearly mark and make sure that you can see: Any grab bars or handrails. First and last steps. Where the edge of each step is. Use tools that help you move around (mobility aids) if they are needed. These include: Canes. Walkers. Scooters. Crutches. Turn on the lights when you go into a dark area. Replace any light bulbs as soon as they burn out. Set up your furniture so you have a clear path. Avoid moving your furniture around. If any of your floors are uneven, fix them. If there are any pets around you, be aware of where they are. Review your medicines with your doctor. Some medicines can make you feel dizzy. This can increase your chance of falling. Ask your doctor what other things that you can do to help prevent falls. This information is not intended to replace advice given to you by your health care provider. Make sure you discuss any questions you have with your health care provider. Document Released: 08/03/2009 Document Revised: 03/14/2016 Document Reviewed: 11/11/2014 Elsevier Interactive Patient Education  2017 [employer].

## 2023-10-09 NOTE — Progress Notes (Signed)
Subjective:   Michelle Long is a 77 y.o. female who presents for Medicare Annual (Subsequent) preventive examination.  Visit Complete: Virtual I connected with  Blanch Media on 10/09/23 by a audio enabled telemedicine application and verified that I am speaking with the correct person using two identifiers.  Patient Location: Home  Provider Location: Office/Clinic  I discussed the limitations of evaluation and management by telemedicine. The patient expressed understanding and agreed to proceed.  Vital Signs: Because this visit was a virtual/telehealth visit, some criteria may be missing or patient reported. Any vitals not documented were not able to be obtained and vitals that have been documented are patient reported.   Cardiac Risk Factors include: advanced age (>18men, >79 women);diabetes mellitus;dyslipidemia;hypertension     Objective:    Today's Vitals   10/09/23 1301  PainSc: 2    There is no height or weight on file to calculate BMI.     10/09/2023    1:04 PM 07/04/2023    2:44 PM 10/03/2022    1:05 PM 06/26/2022    3:52 PM 01/24/2022    1:45 PM 10/01/2021    1:03 PM 06/29/2021   12:30 PM  Advanced Directives  Does Patient Have a Medical Advance Directive? Yes Yes Yes Yes Yes Yes Yes  Type of Estate agent of [place];Living will Healthcare Power of [place];Living will Healthcare Power of [place];Living will Living will;Healthcare Power of Attorney Living will;Healthcare Power of [employer] Power of [place];Living will Living will;Healthcare Power of Attorney  Does patient want to make changes to medical advance directive? No - Patient declined No - Patient declined No - Patient declined No - Patient declined No - Patient declined  No - Patient declined  Copy of Healthcare Power of Attorney in Chart? Yes - validated most recent copy scanned in chart (See row information)  Yes - validated most recent copy scanned in chart (See row  information)  No - copy requested No - copy requested     Current Medications (verified) Outpatient Encounter Medications as of 10/09/2023  Medication Sig   albuterol (VENTOLIN HFA) 108 (90 Base) MCG/ACT inhaler Inhale 2 puffs into the lungs every 6 (six) hours as needed for wheezing or shortness of breath.   Alcohol Swabs (DROPSAFE ALCOHOL PREP) 70 % PADS To use before checking blood sugars   alendronate (FOSAMAX) 70 MG tablet TAKE 1 TABLET ONCE A WEEK WITH A FULL GLASS OF WATER ON AN EMPTY STOMACH   aspirin 81 MG tablet Take 81 mg by mouth daily.   Blood Glucose Calibration (TRUE METRIX LEVEL 1) Low SOLN USE AS DIRECTED WITH GLUCOSE METER   Blood Glucose Monitoring Suppl (TRUE METRIX AIR GLUCOSE METER) w/Device KIT Check blood sugars once daily   budesonide-formoterol (SYMBICORT) 160-4.5 MCG/ACT inhaler Inhale 2 puffs into the lungs 2 (two) times daily.   Calcium Carb-Cholecalciferol (CALCIUM 1000 + D PO) Take by mouth 2 (two) times daily.   Cholecalciferol (VITAMIN D) 2000 units CAPS Take 2,000 Units by mouth daily.   citalopram (CELEXA) 10 MG tablet Take 0.5 tablets (5 mg total) by mouth at bedtime.   ezetimibe (ZETIA) 10 MG tablet TAKE 1 TABLET EVERY DAY   letrozole (FEMARA) 2.5 MG tablet TAKE 1 TABLET EVERY DAY   LORazepam (ATIVAN) 0.5 MG tablet Take 0.5 mg by mouth daily as needed for anxiety.   losartan (COZAAR) 50 MG tablet TAKE 1 TABLET EVERY DAY   metFORMIN (GLUCOPHAGE) 1000 MG tablet TAKE 1 TABLET TWICE  DAILY WITH MEALS   Multiple Vitamin (MULTIVITAMIN ADULT PO) Take 1 capsule by mouth daily at 6 (six) AM.   nateglinide (STARLIX) 60 MG tablet Take 1 tablet (60 mg total) by mouth 2 (two) times daily with a meal.   OLANZapine (ZYPREXA) 5 MG tablet 1/2 tablet daily (Patient taking differently: Take 5 mg by mouth daily. 1 tablet daily)   pantoprazole (PROTONIX) 40 MG tablet TAKE 1 TABLET EVERY DAY   rosuvastatin (CRESTOR) 5 MG tablet TAKE 1 TABLET THREE TIMES WEEKLY   Spacer/Aero  Chamber Mouthpiece MISC To use with inhaler   TRUE METRIX BLOOD GLUCOSE TEST test strip TEST BLOOD SUGAR EVERY DAY   TRUEplus Lancets 33G MISC TEST BLOOD SUGAR EVERY DAY   No facility-administered encounter medications on file as of 10/09/2023.    Allergies (verified) Fluoxetine, Fluoxetine hcl, Lamotrigine, Sulfa antibiotics, Sulfa antibiotics, Sulfa drugs cross reactors, and Statins   History: Past Medical History:  Diagnosis Date   Anxiety    Asthma    Bipolar disorder (HCC)    Bladder cancer (HCC)    Breast cancer (HCC)    Cancer of lower-inner quadrant of left female breast (HCC) 12/08/2013   COPD (chronic obstructive pulmonary disease) (HCC)    former smoker, quit 1999   Depression    Diabetes mellitus    GERD (gastroesophageal reflux disease)    Hepatitis B infection    History of transfusion of whole blood    Approximately 2009, due to breast cancer/chemo.   Hyperlipidemia    Hypertension    Personal history of chemotherapy    Personal history of radiation therapy    PONV (postoperative nausea and vomiting)    Past Surgical History:  Procedure Laterality Date   ABDOMINAL HYSTERECTOMY     APPENDECTOMY  1961   bladder cancer  2006   BREAST LUMPECTOMY Left 2007   BREAST LUMPECTOMY WITH RADIOACTIVE SEED LOCALIZATION Right 03/09/2021   Procedure: RIGHT BREAST LUMPECTOMY WITH RADIOACTIVE SEED LOCALIZATION;  Surgeon: Griselda Miner, MD;  Location: [facility];  Service: General;  Laterality: Right;   PARTIAL HYSTERECTOMY  1991   TONSILLECTOMY  1951   Family History  Problem Relation Age of Onset   Arthritis Mother    Breast cancer Mother    Transient ischemic attack Mother    Hypertension Mother    Heart disease Father    Diabetes Father    Cancer Father        bladder cancer   Throat cancer Father    Breast cancer Sister    Social History   Socioeconomic History   Marital status: Divorced    Spouse name: Not on file   Number of children: Not  on file   Years of education: Not on file   Highest education level: Not on file  Occupational History   Occupation: retired    Comment: retired  Tobacco Use   Smoking status: Former    Current packs/day: 0.00    Average packs/day: 1.5 packs/day for 33.7 years (50.6 ttl pk-yrs)    Types: Cigarettes    Start date: 12/08/1964    Quit date: 08/21/1998    Years since quitting: 25.1   Smokeless tobacco: Never   Tobacco comments:    quit 16 years ago  Vaping Use   Vaping status: Never Used  Substance and Sexual Activity   Alcohol use: Not Currently    Alcohol/week: 0.0 standard drinks of alcohol   Drug use: No   Sexual activity:  Not Currently    Partners: Male    Birth control/protection: Surgical  Other Topics Concern   Not on file  Social History Narrative   Exercise--- no   Social Drivers of Health   Financial Resource Strain: Low Risk  (10/09/2023)   Overall Financial Resource Strain (CARDIA)    Difficulty of Paying Living Expenses: Not hard at all  Food Insecurity: No Food Insecurity (10/09/2023)   Hunger Vital Sign    Worried About Running Out of Food in the Last Year: Never true    Ran Out of Food in the Last Year: Never true  Transportation Needs: No Transportation Needs (10/09/2023)   PRAPARE - Administrator, Civil Service (Medical): No    Lack of Transportation (Non-Medical): No  Physical Activity: Inactive (10/09/2023)   Exercise Vital Sign    Days of Exercise per Week: 0 days    Minutes of Exercise per Session: 0 min  Stress: No Stress Concern Present (10/09/2023)   [employer] of Occupational Health - Occupational Stress Questionnaire    Feeling of Stress : Not at all  Social Connections: Moderately Integrated (10/09/2023)   Social Connection and Isolation Panel [NHANES]    Frequency of Communication with Friends and Family: More than three times a week    Frequency of Social Gatherings with Friends and Family: More than three times a week     Attends Religious Services: More than 4 times per year    Active Member of [employer] or Organizations: Yes    Attends Engineer, structural: More than 4 times per year    Marital Status: Divorced    Tobacco Counseling Counseling given: Not Answered Tobacco comments: quit 16 years ago   Clinical Intake:  Pre-visit preparation completed: Yes  Pain : 0-10 Pain Score: 2  Pain Type: Acute pain Pain Location: Hip Pain Orientation: Left Pain Descriptors / Indicators: Aching Pain Onset: In the past 7 days Pain Frequency: Occasional  Nutritional Risks: None Diabetes: Yes CBG done?: No Did pt. bring in CBG monitor from home?: No  How often do you need to have someone help you when you read instructions, pamphlets, or other written materials from your doctor or pharmacy?: 1 - Never  Interpreter Needed?: No  Information entered by :: Donne Anon, CMA   Activities of Daily Living    10/09/2023    1:02 PM  In your present state of health, do you have any difficulty performing the following activities:  Hearing? 0  Vision? 0  Difficulty concentrating or making decisions? 0  Walking or climbing stairs? 0  Dressing or bathing? 0  Doing errands, shopping? 0  Preparing Food and eating ? N  Using the Toilet? N  In the past six months, have you accidently leaked urine? Y  Do you have problems with loss of bowel control? Y  Comment occasional  Managing your Medications? N  Managing your Finances? N  Housekeeping or managing your Housekeeping? N    Patient Care Team: Zola Button, Grayling Congress, DO as PCP - General (Family Medicine) Sydnee Cabal., MD as Referring Physician (Gastroenterology) Marcellina Millin, MD (Psychiatry) Amada Kingfisher as Consulting Physician (Optometry) Reatha Armour, DDS (Dentistry) Jocelyn Lamer, [place] (Physician Assistant) Josph Macho, MD as Consulting Physician (Oncology) Dermatology, [facility] any recent Medical  Services you may have received from other than [facility] providers in the past year (date may be approximate).     Assessment:   This is a  routine wellness examination for [facility].  Hearing/Vision screen No results found.   Goals Addressed   None    Depression Screen    10/09/2023    1:11 PM 09/30/2023   11:26 AM 03/28/2023   10:50 AM 10/03/2022    1:07 PM 10/01/2021    1:07 PM 03/23/2021    1:22 PM 09/28/2020   11:11 AM  PHQ 2/9 Scores  PHQ - 2 Score 0 0 0 0 0 0 0  PHQ- 9 Score   0   0     Fall Risk    10/09/2023    1:06 PM 09/30/2023   11:26 AM 03/28/2023   10:50 AM 10/03/2022    1:05 PM 10/01/2021    1:05 PM  Fall Risk   Falls in the past year? 0 0 0 0 0  Number falls in past yr: 0 0 0 0 0  Injury with Fall? 0 0 0 0 0  Risk for fall due to : No Fall Risks   No Fall Risks   Follow up Falls evaluation completed Falls evaluation completed Falls evaluation completed Falls evaluation completed Falls prevention discussed    MEDICARE RISK AT HOME: Medicare Risk at Home Any stairs in or around the home?: Yes (4 steps going to door) If so, are there any without handrails?: No Home free of loose throw rugs in walkways, pet beds, electrical cords, etc?: Yes Adequate lighting in your home to reduce risk of falls?: Yes Life alert?: No Use of a cane, walker or w/c?: No Grab bars in the bathroom?: No Shower chair or bench in shower?: No Elevated toilet seat or a handicapped toilet?: No  TIMED UP AND GO:  Was the test performed?  No    Cognitive Function:    03/03/2017    2:15 PM  MMSE - Mini Mental State Exam  Orientation to time 5  Orientation to Place 5  Registration 3  Attention/ Calculation 5  Recall 3  Language- name 2 objects 2  Language- repeat 1  Language- follow 3 step command 3  Language- read & follow direction 1  Write a sentence 1  Copy design 1  Total score 30        10/09/2023    1:12 PM 10/03/2022    1:12 PM 09/28/2020   11:19 AM 09/14/2019     2:52 PM  6CIT Screen  What Year? 0 points 0 points 0 points 0 points  What month? 0 points 0 points 0 points 0 points  What time? 0 points 0 points 0 points 0 points  Count back from 20 0 points 0 points 0 points 0 points  Months in reverse 0 points 0 points 0 points 0 points  Repeat phrase 0 points 2 points 0 points 0 points  Total Score 0 points 2 points 0 points 0 points    Immunizations Immunization History  Administered Date(s) Administered   Fluad Quad(high Dose 65+) 07/13/2019, 07/17/2020, 08/08/2022, 07/11/2023   Influenza Whole 07/01/2012   Influenza, High Dose Seasonal PF 06/19/2017, 07/09/2018   Influenza,inj,Quad PF,6+ Mos 08/12/2013, 07/22/2014, 07/03/2016   Influenza-Unspecified 09/20/2015, 06/29/2017, 07/23/2021   Moderna Covid-19 Fall Seasonal Vaccine 57yrs & older 07/11/2023   Moderna Covid-19 Vaccine Bivalent Booster 12yrs & up 08/08/2022   Moderna SARS-COV2 Booster Vaccination 08/14/2020, 02/15/2021   Moderna Sars-Covid-2 Vaccination 11/11/2019, 11/11/2019, 12/17/2019   Pfizer Covid-19 Vaccine Bivalent Booster 37yrs & up 07/24/2021   Pneumococcal Conjugate-13 01/30/2015   Pneumococcal Polysaccharide-23 10/23/2003, 10/22/2013, 03/23/2021  Respiratory Syncytial Virus Vaccine,Recomb Aduvanted(Arexvy) 08/15/2022   Td 06/22/2013   Tdap 09/20/2015, 09/20/2015   Zoster Recombinant(Shingrix) 07/25/2019   Zoster, Live 09/07/2013    TDAP status: Up to date  Flu Vaccine status: Up to date  Pneumococcal vaccine status: Up to date  Covid-19 vaccine status: Information provided on how to obtain vaccines.   Qualifies for Shingles Vaccine? Yes   Zostavax completed Yes   Shingrix Completed?: No.    Education has been provided regarding the importance of this vaccine. Patient has been advised to call insurance company to determine out of pocket expense if they have not yet received this vaccine. Advised may also receive vaccine at local pharmacy or Health Dept. Verbalized  acceptance and understanding.  Screening Tests Health Maintenance  Topic Date Due   Zoster Vaccines- Shingrix (2 of 2) 09/19/2019   COVID-19 Vaccine (7 - 2024-25 season) 09/05/2023   Medicare Annual Wellness (AWV)  10/04/2023   OPHTHALMOLOGY EXAM  10/04/2023   FOOT EXAM  03/27/2024   HEMOGLOBIN A1C  03/30/2024   Diabetic kidney evaluation - eGFR measurement  09/29/2024   Diabetic kidney evaluation - Urine ACR  09/29/2024   DTaP/Tdap/Td (4 - Td or Tdap) 09/19/2025   Colonoscopy  12/17/2027   Pneumonia Vaccine 38+ Years old  Completed   INFLUENZA VACCINE  Completed   DEXA SCAN  Completed   Hepatitis C Screening  Completed   HPV VACCINES  Aged Out    Health Maintenance  Health Maintenance Due  Topic Date Due   Zoster Vaccines- Shingrix (2 of 2) 09/19/2019   COVID-19 Vaccine (7 - 2024-25 season) 09/05/2023   Medicare Annual Wellness (AWV)  10/04/2023   OPHTHALMOLOGY EXAM  10/04/2023    Colorectal cancer screening: Type of screening: Colonoscopy. Completed 12/16/22. Repeat every 5 years  Mammogram status: Completed 08/21/23. Repeat every year  Bone Density status: Ordered 09/02/23. Pt provided with contact info and advised to call to schedule appt. Pt is scheduled for 04/21/24  Lung Cancer Screening: (Low Dose CT Chest recommended if Age 34-80 years, 20 pack-year currently smoking OR have quit w/in 15years.) does not qualify.   Additional Screening:  Hepatitis C Screening: does qualify; Completed 11/17/15  Vision Screening: Recommended annual ophthalmology exams for early detection of glaucoma and other disorders of the eye. Is the patient up to date with their annual eye exam?  Yes  Who is the provider or what is the name of the office in which the patient attends annual eye exams? [facility] If pt is not established with a provider, would they like to be referred to a provider to establish care? No .   Dental Screening: Recommended annual dental exams for proper oral  hygiene  Diabetic Foot Exam: Diabetic Foot Exam: Completed 03/28/23  Community Resource Referral / Chronic Care Management: CRR required this visit?  No   CCM required this visit?  No     Plan:     I have personally reviewed and noted the following in the patient's chart:   Medical and social history Use of alcohol, tobacco or illicit drugs  Current medications and supplements including opioid prescriptions. Patient is not currently taking opioid prescriptions. Functional ability and status Nutritional status Physical activity Advanced directives List of other physicians Hospitalizations, surgeries, and ER visits in previous 12 months Vitals Screenings to include cognitive, depression, and falls Referrals and appointments  In addition, I have reviewed and discussed with patient certain preventive protocols, quality metrics, and best practice recommendations. A written  personalized care plan for preventive services as well as general preventive health recommendations were provided to patient.     Donne Anon, CMA   10/09/2023   After Visit Summary: (MyChart) Due to this being a telephonic visit, the after visit summary with patients personalized plan was offered to patient via MyChart   Nurse Notes: None

## 2023-10-14 ENCOUNTER — Other Ambulatory Visit: Payer: Self-pay | Admitting: Family Medicine

## 2023-10-14 DIAGNOSIS — J4520 Mild intermittent asthma, uncomplicated: Secondary | ICD-10-CM

## 2023-10-20 ENCOUNTER — Other Ambulatory Visit: Payer: Medicare HMO

## 2023-10-23 ENCOUNTER — Other Ambulatory Visit: Payer: Self-pay | Admitting: Family Medicine

## 2023-10-23 MED ORDER — METFORMIN HCL 1000 MG PO TABS: 1000.0000 mg | ORAL_TABLET | Freq: Two times a day (BID) | ORAL | 1 refills | Status: DC

## 2023-10-23 NOTE — Telephone Encounter (Signed)
 Copied from CRM [id number]. Topic: Clinical - Medication Refill >> Oct 23, 2023 11:02 AM Eleanor BROCKS wrote: Most Recent Primary Care Visit:  Provider: KANDIS JEOFFREY CROME  Department: [facility]  Visit Type: MEDICARE AWV, SEQUENTIAL  Date: 10/09/2023  Medication: metFORMIN  (GLUCOPHAGE ) 1000 MG tablet  Has the patient contacted their pharmacy? Yes (Agent: If no, request that the patient contact the pharmacy for the refill. If patient does not wish to contact the pharmacy document the reason why and proceed with request.) (Agent: If yes, when and what did the pharmacy advise?)  Is this the correct pharmacy for this prescription? Yes If no, delete pharmacy and type the correct one.  This is the patient's preferred pharmacy:  [facility] [address] [place], [place] [address] Phone: [phone] Fax: [phone]    Has the prescription been filled recently? No  Is the patient out of the medication? No  Has the patient been seen for an appointment in the last year OR does the patient have an upcoming appointment? Yes  Can we respond through MyChart? Yes  Agent: Please be advised that Rx refills may take up to 3 business days. We ask that you follow-up with your pharmacy.

## 2023-10-23 NOTE — Telephone Encounter (Signed)
 Rx sent

## 2023-12-02 ENCOUNTER — Other Ambulatory Visit: Payer: Self-pay | Admitting: Family Medicine

## 2023-12-02 ENCOUNTER — Telehealth: Payer: Self-pay | Admitting: Family Medicine

## 2023-12-02 DIAGNOSIS — I10 Essential (primary) hypertension: Secondary | ICD-10-CM

## 2023-12-02 NOTE — Telephone Encounter (Signed)
Copied from CRM [id number]. Topic: Clinical - Medication Refill >> Dec 02, 2023  4:12 PM Truddie Crumble wrote: Most Recent Primary Care Visit:  Provider: Juel Burrow  Department: [facility]  Visit Type: MEDICARE AWV, SEQUENTIAL  Date: 10/09/2023  Medication: losartan, ezetimibe  Has the patient contacted their pharmacy? Yes (Agent: If no, request that the patient contact the pharmacy for the refill. If patient does not wish to contact the pharmacy document the reason why and proceed with request.) (Agent: If yes, when and what did the pharmacy advise?)  Is this the correct pharmacy for this prescription? Yes If no, delete pharmacy and type the correct one.  This is the patient's preferred pharmacy:   [facility]  Has the prescription been filled recently? No  Is the patient out of the medication? Yes  Has the patient been seen for an appointment in the last year OR does the patient have an upcoming appointment? Yes  Can we respond through MyChart? Yes  Agent: Please be advised that Rx refills may take up to 3 business days. We ask that you follow-up with your pharmacy.

## 2023-12-02 NOTE — Telephone Encounter (Signed)
Copied from CRM [id number]. Topic: Clinical - Prescription Issue >> Dec 02, 2023  4:49 PM Eunice Blase wrote: Reason for CRM: Pt has new insurance for Rx has [facility] [id number], [place], [place] [address] Ph: [phone]. Pt is trying to refill medication this might be the issues the change of insurance. Please call pt [phone]

## 2023-12-02 NOTE — Telephone Encounter (Unsigned)
Copied from CRM [id number]. Topic: Clinical - Medication Refill >> Dec 02, 2023  4:28 PM Isabell A wrote: Most Recent Primary Care Visit:  Provider: Juel Burrow  Department: [facility]  Visit Type: MEDICARE AWV, SEQUENTIAL  Date: 10/09/2023  Medication: letrozole (FEMARA) 2.5 MG tablet  Has the patient contacted their pharmacy? Yes (Agent: If no, request that the patient contact the pharmacy for the refill. If patient does not wish to contact the pharmacy document the reason why and proceed with request.) (Agent: If yes, when and what did the pharmacy advise?)  Is this the correct pharmacy for this prescription? Yes If no, delete pharmacy and type the correct one.  This is the patient's preferred pharmacy:   [facility]    Has the prescription been filled recently? Yes  Is the patient out of the medication? No  Has the patient been seen for an appointment in the last year OR does the patient have an upcoming appointment? Yes  Can we respond through MyChart? No  Agent: Please be advised that Rx refills may take up to 3 business days. We ask that you follow-up with your pharmacy.

## 2023-12-03 MED ORDER — LETROZOLE 2.5 MG PO TABS: 2.5000 mg | ORAL_TABLET | Freq: Every day | ORAL | 3 refills | Status: DC

## 2023-12-03 NOTE — Telephone Encounter (Signed)
Pt has new insurance. Can we get her chart updated please, if necessary.

## 2023-12-05 ENCOUNTER — Telehealth: Payer: Self-pay | Admitting: *Deleted

## 2023-12-05 NOTE — Telephone Encounter (Signed)
Message received from patient for Dr. Myna Hidalgo to ask if she may hold Letrozole for two months to see if her restlessness decreases.  Dr. Myna Hidalgo notified.  Call placed back to patient and patient notified that it is ok per Dr. Myna Hidalgo for her to hold Letrozole for two months.  Pt is appreciative of call and has no further questions at this time. Message sent to scheduling to arrange pt f/u.

## 2023-12-09 ENCOUNTER — Telehealth: Payer: Self-pay

## 2023-12-09 DIAGNOSIS — I10 Essential (primary) hypertension: Secondary | ICD-10-CM

## 2023-12-09 MED ORDER — EZETIMIBE 10 MG PO TABS: 10.0000 mg | ORAL_TABLET | Freq: Every day | ORAL | 1 refills | Status: DC

## 2023-12-09 MED ORDER — LOSARTAN POTASSIUM 50 MG PO TABS: 50.0000 mg | ORAL_TABLET | Freq: Every day | ORAL | 1 refills | Status: DC

## 2023-12-09 NOTE — Telephone Encounter (Signed)
 Rxs sent

## 2023-12-09 NOTE — Telephone Encounter (Signed)
Copied from CRM [id number]. Topic: Clinical - Prescription Issue >> Dec 09, 2023 10:27 AM Pascal Lux wrote: Reason for CRM: Patient called stated she has not received her medications: ezetimibe (ZETIA) 10 MG tablet [[id number]] and losartan (COZAAR) 50 MG tablet [[id number]]. Both should have been sent to [facility] [place], [place] - [address]. Phone: [phone]  Fax: [phone]

## 2023-12-10 ENCOUNTER — Telehealth: Payer: Self-pay | Admitting: Hematology & Oncology

## 2023-12-10 NOTE — Telephone Encounter (Signed)
 Called to schedule follow up appt. LVM to return call for scheduling.

## 2023-12-11 ENCOUNTER — Ambulatory Visit: Payer: Medicare Other | Admitting: Family Medicine

## 2023-12-11 ENCOUNTER — Encounter: Payer: Self-pay | Admitting: Family Medicine

## 2023-12-11 VITALS — BP 122/60 | HR 97 | Temp 97.8°F | Resp 18 | Ht 64.0 in | Wt 148.0 lb

## 2023-12-11 DIAGNOSIS — R748 Abnormal levels of other serum enzymes: Secondary | ICD-10-CM | POA: Diagnosis not present

## 2023-12-11 DIAGNOSIS — I10 Essential (primary) hypertension: Secondary | ICD-10-CM

## 2023-12-11 DIAGNOSIS — E785 Hyperlipidemia, unspecified: Secondary | ICD-10-CM

## 2023-12-11 DIAGNOSIS — R252 Cramp and spasm: Secondary | ICD-10-CM | POA: Diagnosis not present

## 2023-12-11 DIAGNOSIS — E1169 Type 2 diabetes mellitus with other specified complication: Secondary | ICD-10-CM | POA: Diagnosis not present

## 2023-12-11 DIAGNOSIS — E1165 Type 2 diabetes mellitus with hyperglycemia: Secondary | ICD-10-CM

## 2023-12-11 LAB — COMPREHENSIVE METABOLIC PANEL
ALT: 37 U/L — ABNORMAL HIGH (ref 0–35)
AST: 36 U/L (ref 0–37)
Albumin: 4.2 g/dL (ref 3.5–5.2)
Alkaline Phosphatase: 33 U/L — ABNORMAL LOW (ref 39–117)
BUN: 12 mg/dL (ref 6–23)
CO2: 27 meq/L (ref 19–32)
Calcium: 9.4 mg/dL (ref 8.4–10.5)
Chloride: 101 meq/L (ref 96–112)
Creatinine, Ser: 0.81 mg/dL (ref 0.40–1.20)
GFR: 69.89 mL/min (ref >60.00)
Glucose, Bld: 121 mg/dL — ABNORMAL HIGH (ref 70–99)
Potassium: 4.4 meq/L (ref 3.5–5.1)
Sodium: 140 meq/L (ref 135–145)
Total Bilirubin: 0.5 mg/dL (ref 0.2–1.2)
Total Protein: 6.6 g/dL (ref 6.0–8.3)

## 2023-12-11 LAB — CBC WITH DIFFERENTIAL/PLATELET
Basophils Absolute: 0 10*3/uL (ref 0.0–0.1)
Basophils Relative: 0.6 % (ref 0.0–3.0)
Eosinophils Absolute: 0.2 10*3/uL (ref 0.0–0.7)
Eosinophils Relative: 3.9 % (ref 0.0–5.0)
HCT: 40.3 % (ref 36.0–46.0)
Hemoglobin: 13.1 g/dL (ref 12.0–15.0)
Lymphocytes Relative: 27.7 % (ref 12.0–46.0)
Lymphs Abs: 1.4 10*3/uL (ref 0.7–4.0)
MCHC: 32.4 g/dL (ref 30.0–36.0)
MCV: 90.3 fL (ref 78.0–100.0)
Monocytes Absolute: 0.4 10*3/uL (ref 0.1–1.0)
Monocytes Relative: 8.4 % (ref 3.0–12.0)
Neutro Abs: 2.9 10*3/uL (ref 1.4–7.7)
Neutrophils Relative %: 59.4 % (ref 43.0–77.0)
Platelets: 189 10*3/uL (ref 150.0–400.0)
RBC: 4.47 Mil/uL (ref 3.87–5.11)
RDW: 15.1 % (ref 11.5–15.5)
WBC: 4.9 10*3/uL (ref 4.0–10.5)

## 2023-12-11 LAB — HEMOGLOBIN A1C: Hgb A1c MFr Bld: 6.7 % — ABNORMAL HIGH (ref 4.6–6.5)

## 2023-12-11 LAB — LIPID PANEL
Cholesterol: 131 mg/dL (ref 0–200)
HDL: 50.6 mg/dL (ref >39.00)
LDL Cholesterol: 59 mg/dL (ref 0–99)
NonHDL: 80.83
Total CHOL/HDL Ratio: 3
Triglycerides: 109 mg/dL (ref 0.0–149.0)
VLDL: 21.8 mg/dL (ref 0.0–40.0)

## 2023-12-11 LAB — MAGNESIUM: Magnesium: 1.5 mg/dL (ref 1.5–2.5)

## 2023-12-11 NOTE — Assessment & Plan Note (Signed)
Check labs  D/w pt possible causes and tx

## 2023-12-11 NOTE — Assessment & Plan Note (Signed)
 Encourage heart healthy diet such as MIND or DASH diet, increase exercise, avoid trans fats, simple carbohydrates and processed foods, consider a krill or fish or flaxseed oil cap daily.

## 2023-12-11 NOTE — Progress Notes (Signed)
Established Patient Office Visit  Subjective   Patient ID: Michelle Long, female    DOB: 02-08-1946  Age: 78 y.o. MRN: [id number]  Chief Complaint  Patient presents with   Foot Problem    Bilateral, sxs going on for a while, pt states having cramping in her feet and her toes curl.    HPI Discussed the use of AI scribe software for clinical note transcription with the patient, who gave verbal consent to proceed.  History of Present Illness   Michelle Long is a 78 year old female who presents with muscle cramping and elevated liver enzymes.  She experiences severe muscle cramping, primarily at night, affecting her toes, foot, and calf. The cramps are intense, causing her toes to curl under, and she finds relief by walking around. She takes a supplement containing calcium, magnesium, and zinc but is unsure of the magnesium content. She has decreased her water intake, no longer consuming eight glasses a day as she used to. No current cramping during the visit. No use of diuretics.  She mentions a recent increase in liver enzymes, which she attributes to her cholesterol medication. She denies the use of Tylenol, herbal medicines, or alcohol, which could contribute to liver enzyme elevation.  She has a history of using letrozole, which she associates with symptoms of chest pressure and breathlessness. After consulting with another physician, she has discontinued letrozole and reports a significant reduction in the use of her rescue inhaler, indicating an improvement in her symptoms.  There was a discussion of general health maintenance, including monitoring of blood sugar levels, which have not been checked recently.      Patient Active Problem List   Diagnosis Date Noted   Elevated liver enzymes 12/11/2023   Cardiomyopathy due to chemotherapy (HCC) 03/23/2021   Thrombocytopenia (HCC) 03/23/2021   Porokeratosis 09/01/2019   Type 2 diabetes mellitus with hyperglycemia, without long-term  current use of insulin (HCC) 01/08/2019   Hyperlipidemia associated with type 2 diabetes mellitus (HCC) 01/08/2019   Impacted cerumen of left ear 12/10/2016   Essential hypertension 05/16/2016   Contact dermatitis 02/17/2015   Cancer of lower-inner quadrant of left female breast (HCC) 12/08/2013   Estrogen deficiency 10/22/2013   Athlete's foot 04/03/2013   Elevated BP 11/05/2012   Grief 10/06/2012   Fatigue 02/13/2012   Easy bruising 09/25/2011   Leg cramping 08/17/2011   Arm swelling 08/16/2011   Lower urinary tract infectious disease 06/04/2011   Vitamin D deficiency 05/29/2011   Preventative health care 05/29/2011   Hx of abnormal cervical Pap smear 05/29/2011   Dysuria 05/15/2011   Dry skin 05/15/2011   Asthma    Bladder cancer (HCC)    Bipolar disorder (HCC)    Inadequately controlled diabetes mellitus (HCC)    GERD (gastroesophageal reflux disease)    Past Medical History:  Diagnosis Date   Anxiety    Asthma    Bipolar disorder (HCC)    Bladder cancer (HCC)    Breast cancer (HCC)    Cancer of lower-inner quadrant of left female breast (HCC) 12/08/2013   COPD (chronic obstructive pulmonary disease) (HCC)    former smoker, quit 1999   Depression    Diabetes mellitus    GERD (gastroesophageal reflux disease)    Hepatitis B infection    History of transfusion of whole blood    Approximately 2009, due to breast cancer/chemo.   Hyperlipidemia    Hypertension    Personal history of chemotherapy  Personal history of radiation therapy    PONV (postoperative nausea and vomiting)    Past Surgical History:  Procedure Laterality Date   ABDOMINAL HYSTERECTOMY     APPENDECTOMY  1961   bladder cancer  2006   BREAST LUMPECTOMY Left 2007   BREAST LUMPECTOMY WITH RADIOACTIVE SEED LOCALIZATION Right 03/09/2021   Procedure: RIGHT BREAST LUMPECTOMY WITH RADIOACTIVE SEED LOCALIZATION;  Surgeon: Griselda Miner, MD;  Location: [facility];  Service: General;   Laterality: Right;   PARTIAL HYSTERECTOMY  1991   TONSILLECTOMY  1951   Social History   Tobacco Use   Smoking status: Former    Current packs/day: 0.00    Average packs/day: 1.5 packs/day for 33.7 years (50.6 ttl pk-yrs)    Types: Cigarettes    Start date: 12/08/1964    Quit date: 08/21/1998    Years since quitting: 25.3   Smokeless tobacco: Never   Tobacco comments:    quit 16 years ago  Vaping Use   Vaping status: Never Used  Substance Use Topics   Alcohol use: Not Currently    Alcohol/week: 0.0 standard drinks of alcohol   Drug use: No   Social History   Socioeconomic History   Marital status: Divorced    Spouse name: Not on file   Number of children: Not on file   Years of education: Not on file   Highest education level: Not on file  Occupational History   Occupation: retired    Comment: retired  Tobacco Use   Smoking status: Former    Current packs/day: 0.00    Average packs/day: 1.5 packs/day for 33.7 years (50.6 ttl pk-yrs)    Types: Cigarettes    Start date: 12/08/1964    Quit date: 08/21/1998    Years since quitting: 25.3   Smokeless tobacco: Never   Tobacco comments:    quit 16 years ago  Vaping Use   Vaping status: Never Used  Substance and Sexual Activity   Alcohol use: Not Currently    Alcohol/week: 0.0 standard drinks of alcohol   Drug use: No   Sexual activity: Not Currently    Partners: Male    Birth control/protection: Surgical  Other Topics Concern   Not on file  Social History Narrative   Exercise--- no   Social Drivers of Health   Financial Resource Strain: Low Risk  (10/09/2023)   Overall Financial Resource Strain (CARDIA)    Difficulty of Paying Living Expenses: Not hard at all  Food Insecurity: No Food Insecurity (10/09/2023)   Hunger Vital Sign    Worried About Running Out of Food in the Last Year: Never true    Ran Out of Food in the Last Year: Never true  Transportation Needs: No Transportation Needs (10/09/2023)   PRAPARE  - Administrator, Civil Service (Medical): No    Lack of Transportation (Non-Medical): No  Physical Activity: Inactive (10/09/2023)   Exercise Vital Sign    Days of Exercise per Week: 0 days    Minutes of Exercise per Session: 0 min  Stress: No Stress Concern Present (10/09/2023)   [employer] of Occupational Health - Occupational Stress Questionnaire    Feeling of Stress : Not at all  Social Connections: Moderately Integrated (10/09/2023)   Social Connection and Isolation Panel [NHANES]    Frequency of Communication with Friends and Family: More than three times a week    Frequency of Social Gatherings with Friends and Family: More than three times a  week    Attends Religious Services: More than 4 times per year    Active Member of Clubs or Organizations: Yes    Attends Banker Meetings: More than 4 times per year    Marital Status: Divorced  Intimate Partner Violence: Not At Risk (10/09/2023)   Humiliation, Afraid, Rape, and Kick questionnaire    Fear of Current or Ex-Partner: No    Emotionally Abused: No    Physically Abused: No    Sexually Abused: No   Family Status  Relation Name Status   Mother  Deceased at age 16       natural causes   Father  Deceased at age 26       heart disease. cancer   Sister  Alive   Sister  Alive  No partnership data on file   Family History  Problem Relation Age of Onset   Arthritis Mother    Breast cancer Mother    Transient ischemic attack Mother    Hypertension Mother    Heart disease Father    Diabetes Father    Cancer Father        bladder cancer   Throat cancer Father    Breast cancer Sister    Allergies  Allergen Reactions   Fluoxetine Other (See Comments)    seizure   Fluoxetine Hcl Other (See Comments)    seizure   Lamotrigine Itching and Other (See Comments)    itch   Sulfa Antibiotics Shortness Of Breath   Sulfa Antibiotics    Sulfa Drugs Cross Reactors Shortness Of Breath         Statins Other (See Comments)    Muscle pain  Muscle pains, Muscle pains      Review of Systems  Constitutional:  Negative for chills, fever and malaise/fatigue.  HENT:  Negative for congestion and hearing loss.   Eyes:  Negative for blurred vision and discharge.  Respiratory:  Negative for cough, sputum production and shortness of breath.   Cardiovascular:  Negative for chest pain, palpitations and leg swelling.  Gastrointestinal:  Negative for abdominal pain, blood in stool, constipation, diarrhea, heartburn, nausea and vomiting.  Genitourinary:  Negative for dysuria, frequency, hematuria and urgency.  Musculoskeletal:  Negative for back pain, falls and myalgias.  Skin:  Negative for rash.  Neurological:  Negative for dizziness, sensory change, loss of consciousness, weakness and headaches.  Endo/Heme/Allergies:  Negative for environmental allergies. Does not bruise/bleed easily.  Psychiatric/Behavioral:  Negative for depression and suicidal ideas. The patient is not nervous/anxious and does not have insomnia.       Objective:     BP 122/60 (BP Location: Right Arm, Patient Position: Sitting)   Pulse 97   Temp 97.8 F (36.6 C) (Oral)   Resp 18   Ht 5\' 4"  (1.626 m)   Wt 148 lb (67.1 kg)   SpO2 95%   BMI 25.40 kg/m  BP Readings from Last 3 Encounters:  12/11/23 122/60  09/30/23 132/68  07/04/23 132/75   Wt Readings from Last 3 Encounters:  12/11/23 148 lb (67.1 kg)  09/30/23 148 lb 12.8 oz (67.5 kg)  07/04/23 148 lb (67.1 kg)   SpO2 Readings from Last 3 Encounters:  12/11/23 95%  09/30/23 96%  07/04/23 94%      Physical Exam Vitals and nursing note reviewed.  Constitutional:      General: She is not in acute distress.    Appearance: Normal appearance. She is well-developed.  HENT:  Head: Normocephalic and atraumatic.  Eyes:     General: No scleral icterus.       Right eye: No discharge.        Left eye: No discharge.  Cardiovascular:     Rate and  Rhythm: Normal rate and regular rhythm.     Heart sounds: No murmur heard. Pulmonary:     Effort: Pulmonary effort is normal. No respiratory distress.     Breath sounds: Normal breath sounds.  Musculoskeletal:        General: Normal range of motion.     Cervical back: Normal range of motion and neck supple.     Right lower leg: No edema.     Left lower leg: No edema.  Skin:    General: Skin is warm and dry.  Neurological:     Mental Status: She is alert and oriented to person, place, and time.  Psychiatric:        Mood and Affect: Mood normal.        Behavior: Behavior normal.        Thought Content: Thought content normal.        Judgment: Judgment normal.      No results found for any visits on 12/11/23.  Last CBC Lab Results  Component Value Date   WBC 5.3 09/30/2023   HGB 12.9 09/30/2023   HCT 40.5 09/30/2023   MCV 90.6 09/30/2023   MCH 29.1 07/04/2023   RDW 14.8 09/30/2023   PLT 188.0 09/30/2023   Last metabolic panel Lab Results  Component Value Date   GLUCOSE 135 (H) 09/30/2023   NA 142 09/30/2023   K 4.6 09/30/2023   CL 103 09/30/2023   CO2 30 09/30/2023   BUN 10 09/30/2023   CREATININE 0.77 09/30/2023   GFR 74.37 09/30/2023   CALCIUM 9.4 09/30/2023   PROT 6.5 09/30/2023   ALBUMIN 4.2 09/30/2023   LABGLOB 2.8 07/02/2017   AGRATIO 1.6 07/02/2017   BILITOT 0.5 09/30/2023   ALKPHOS 36 (L) 09/30/2023   AST 42 (H) 09/30/2023   ALT 44 (H) 09/30/2023   ANIONGAP 10 07/04/2023   Last lipids Lab Results  Component Value Date   CHOL 149 09/30/2023   HDL 47.70 09/30/2023   LDLCALC 75 09/30/2023   LDLDIRECT 145.6 06/22/2013   TRIG 133.0 09/30/2023   CHOLHDL 3 09/30/2023   Last hemoglobin A1c Lab Results  Component Value Date   HGBA1C 6.7 (H) 09/30/2023   Last thyroid functions Lab Results  Component Value Date   TSH 0.80 01/30/2015   Last vitamin D Lab Results  Component Value Date   VD25OH 67.63 12/25/2022   Last vitamin B12 and  Folate No results found for: "VITAMINB12", "FOLATE"    The 10-year ASCVD risk score (Arnett DK, et al., 2019) is: 40.1%    Assessment & Plan:   Problem List Items Addressed This Visit       Unprioritized   Type 2 diabetes mellitus with hyperglycemia, without long-term current use of insulin (HCC)   Relevant Orders   Comprehensive metabolic panel   Hemoglobin A1c   Leg cramping   Check labs  D/w pt possible causes and tx       Relevant Orders   CBC with Differential/Platelet   Comprehensive metabolic panel   Magnesium   Hyperlipidemia associated with type 2 diabetes mellitus (HCC)   Encourage heart healthy diet such as MIND or DASH diet, increase exercise, avoid trans fats, simple carbohydrates and processed foods, consider a  krill or fish or flaxseed oil cap daily.        Relevant Orders   Lipid panel   CBC with Differential/Platelet   Comprehensive metabolic panel   Essential hypertension - Primary   Well controlled, no changes to meds. Encouraged heart healthy diet such as the DASH diet and exercise as tolerated.        Relevant Orders   Lipid panel   CBC with Differential/Platelet   Comprehensive metabolic panel   Hemoglobin A1c   Elevated liver enzymes   Recheck today ? If from zetia      .Assessment and Plan    Breathlessness and Chest Pressure Symptoms were potentially linked to letrozole. Discontinuation of letrozole, with oncologist's approval, led to significant symptom improvement and reduced use of the rescue inhaler. Alternative treatments will be considered based on the oncologist's recommendations. Ongoing management of letrozole will be discussed with the oncologist, and symptoms will be monitored for recurrence.  Elevated Liver Enzymes Mildly elevated liver enzymes are potentially due to Azetami. There is no use of Tylenol, herbal medicines, or alcohol. The importance of regular blood work was discussed. Liver enzyme tests will be repeated today,  and levels will be monitored.  Muscle Cramps Nocturnal muscle cramps are likely related to electrolyte imbalance, dehydration, or calcium/magnesium deficiency. Currently taking calcium, magnesium, and zinc supplements, but the magnesium dosage is uncertain. No diuretics are being used. Over-the-counter remedies and hydration were discussed. Magnesium levels and anemia will be checked in labs. Increasing fluid intake is advised, and over-the-counter remedies such as mustard, pickle juice, tonic water with quinine, and Hyland's homeopathic leg cramp remedy are recommended.  General Health Maintenance Routine blood work will be performed today to monitor overall health and manage existing conditions.  Follow-up A follow-up appointment is scheduled for June.        Return in about 6 months (around 06/09/2024), or if symptoms worsen or fail to improve.    Donato Schultz, DO

## 2023-12-11 NOTE — Assessment & Plan Note (Signed)
 Well controlled, no changes to meds. Encouraged heart healthy diet such as the DASH diet and exercise as tolerated.

## 2023-12-11 NOTE — Assessment & Plan Note (Signed)
Recheck today ? If from zetia

## 2023-12-11 NOTE — Patient Instructions (Signed)
Leg Cramps: What They Mean Leg cramps happen when one or more muscles tighten and there's no control over it. They can happen during exercise or when you're resting. Leg cramps are painful and can last for a few seconds to minutes. They can also come back many times before stopping. Usually, leg cramps aren't caused by a serious medical problem. Often, the cause isn't known. Some common causes include: Problems with moving or not moving the body, like: Working your muscles too hard, such as during intense exercise. Doing the same motion over and over. Not warming up or stretching before playing sports or doing activities. Using the wrong technique or form when playing sports or doing activities. Staying in one position for a long time. Water or electrolyte balance issues, like: Not drinking enough fluids or being dehydrated. Getting sick from too much heat. Having low levels of minerals called electrolytes in your blood, like potassium and calcium. This can happen from: Pregnancy. Taking medicines that make you pee more, also called diuretic medicines. Not getting enough nutrients from your diet. Side effects of some medicines. Follow these instructions at home: Eating and drinking Eat and drink as told. Eat a healthy diet that includes plenty of nutrients to help your muscles work well. A healthy diet includes fruits and vegetables, lean protein, whole grains, and low-fat or nonfat dairy products. Drink enough fluids to keep your pee pale yellow. Drinking more water may help prevent cramps. Managing pain and muscle cramping     Massage, stretch, and relax the cramped muscle. Do this for several minutes at a time. Use ice or an ice pack as told. Place a towel between your skin and the ice. Leave the ice on for 20 minutes, 2-3 times a day. Use heat as told. Use the heat source that your provider recommends, such as a moist heat pack or a heating pad. Do this as often as told. Place a  towel between your skin and the heat source. Leave the heat on for 20-30 minutes. If your skin turns red, take off the ice or heat right away to prevent skin damage. The risk of damage is higher if you can't feel pain, heat, or cold. Take hot showers or baths to help relax tight muscles. General instructions If you're having a lot of leg cramps, avoid hard workouts for several days. Take supplements and medicines only as told. Contact a health care provider if: Your leg cramps get worse or happen more often. Your leg cramps don't get better over time. Your foot becomes cold, numb, or blue. This information is not intended to replace advice given to you by your health care provider. Make sure you discuss any questions you have with your health care provider. Document Revised: 06/18/2023 Document Reviewed: 06/18/2023 Elsevier Patient Education  2024 [employer].

## 2023-12-17 ENCOUNTER — Encounter: Payer: Self-pay | Admitting: Family Medicine

## 2023-12-19 ENCOUNTER — Other Ambulatory Visit: Payer: Self-pay | Admitting: Family Medicine

## 2023-12-19 DIAGNOSIS — J4520 Mild intermittent asthma, uncomplicated: Secondary | ICD-10-CM

## 2023-12-19 MED ORDER — BUDESONIDE-FORMOTEROL FUMARATE 160-4.5 MCG/ACT IN AERO: 2.0000 | INHALATION_SPRAY | Freq: Two times a day (BID) | RESPIRATORY_TRACT | 1 refills | Status: DC

## 2023-12-19 NOTE — Telephone Encounter (Signed)
 Last Fill: 06/12/23  Last OV: 12/11/23 Next OV: 03/30/24  Routing to provider for review/authorization.

## 2023-12-19 NOTE — Telephone Encounter (Signed)
 Copied from CRM [id number]. Topic: Clinical - Medication Refill >> Dec 19, 2023 12:09 PM Corin V wrote: Most Recent Primary Care Visit:  Provider: Seabron Spates R  Department: [facility]  Visit Type: ACUTE  Date: 12/11/2023  Medication: budesonide-formoterol (SYMBICORT) 160-4.5 MCG/ACT inhaler  Has the patient contacted their pharmacy? Yes (Agent: If no, request that the patient contact the pharmacy for the refill. If patient does not wish to contact the pharmacy document the reason why and proceed with request.) (Agent: If yes, when and what did the pharmacy advise?)  Is this the correct pharmacy for this prescription? Yes If no, delete pharmacy and type the correct one.  This is the patient's preferred pharmacy:  [facility] - [place], [place] - [address] [place] [address] Phone: [phone] Fax: [phone]   Has the prescription been filled recently? No  Is the patient out of the medication? No  Has the patient been seen for an appointment in the last year OR does the patient have an upcoming appointment? Yes  Can we respond through MyChart? No  Agent: Please be advised that Rx refills may take up to 3 business days. We ask that you follow-up with your pharmacy.

## 2023-12-23 ENCOUNTER — Encounter: Payer: Self-pay | Admitting: Medical Oncology

## 2023-12-23 ENCOUNTER — Inpatient Hospital Stay: Payer: Medicare Other | Attending: Hematology & Oncology

## 2023-12-23 ENCOUNTER — Inpatient Hospital Stay: Payer: Medicare Other | Admitting: Medical Oncology

## 2023-12-23 VITALS — BP 113/92 | HR 100 | Temp 98.1°F | Resp 18 | Ht 64.0 in | Wt 145.0 lb

## 2023-12-23 DIAGNOSIS — Z853 Personal history of malignant neoplasm of breast: Secondary | ICD-10-CM | POA: Insufficient documentation

## 2023-12-23 DIAGNOSIS — N6091 Unspecified benign mammary dysplasia of right breast: Secondary | ICD-10-CM | POA: Diagnosis not present

## 2023-12-23 DIAGNOSIS — Z87891 Personal history of nicotine dependence: Secondary | ICD-10-CM | POA: Diagnosis not present

## 2023-12-23 DIAGNOSIS — Z170 Estrogen receptor positive status [ER+]: Secondary | ICD-10-CM | POA: Diagnosis not present

## 2023-12-23 DIAGNOSIS — N6099 Unspecified benign mammary dysplasia of unspecified breast: Secondary | ICD-10-CM | POA: Diagnosis not present

## 2023-12-23 DIAGNOSIS — C50312 Malignant neoplasm of lower-inner quadrant of left female breast: Secondary | ICD-10-CM

## 2023-12-23 DIAGNOSIS — Z79811 Long term (current) use of aromatase inhibitors: Secondary | ICD-10-CM | POA: Insufficient documentation

## 2023-12-23 DIAGNOSIS — J4521 Mild intermittent asthma with (acute) exacerbation: Secondary | ICD-10-CM

## 2023-12-23 LAB — CMP (CANCER CENTER ONLY)
ALT: 40 U/L (ref 0–44)
AST: 39 U/L (ref 15–41)
Albumin: 4.7 g/dL (ref 3.5–5.0)
Alkaline Phosphatase: 35 U/L — ABNORMAL LOW (ref 38–126)
Anion gap: 12 (ref 5–15)
BUN: 10 mg/dL (ref 8–23)
CO2: 26 mmol/L (ref 22–32)
Calcium: 10.4 mg/dL — ABNORMAL HIGH (ref 8.9–10.3)
Chloride: 103 mmol/L (ref 98–111)
Creatinine: 0.89 mg/dL (ref 0.44–1.00)
GFR, Estimated: >60 mL/min (ref >60)
Glucose, Bld: 132 mg/dL — ABNORMAL HIGH (ref 70–99)
Potassium: 4.5 mmol/L (ref 3.5–5.1)
Sodium: 141 mmol/L (ref 135–145)
Total Bilirubin: 0.5 mg/dL (ref 0.0–1.2)
Total Protein: 7.3 g/dL (ref 6.5–8.1)

## 2023-12-23 LAB — CBC WITH DIFFERENTIAL (CANCER CENTER ONLY)
Abs Immature Granulocytes: 0.03 10*3/uL (ref 0.00–0.07)
Basophils Absolute: 0 10*3/uL (ref 0.0–0.1)
Basophils Relative: 1 %
Eosinophils Absolute: 0.2 10*3/uL (ref 0.0–0.5)
Eosinophils Relative: 3 %
HCT: 41.7 % (ref 36.0–46.0)
Hemoglobin: 13.6 g/dL (ref 12.0–15.0)
Immature Granulocytes: 0 %
Lymphocytes Relative: 27 %
Lymphs Abs: 1.9 10*3/uL (ref 0.7–4.0)
MCH: 29.1 pg (ref 26.0–34.0)
MCHC: 32.6 g/dL (ref 30.0–36.0)
MCV: 89.3 fL (ref 80.0–100.0)
Monocytes Absolute: 0.6 10*3/uL (ref 0.1–1.0)
Monocytes Relative: 9 %
Neutro Abs: 4.2 10*3/uL (ref 1.7–7.7)
Neutrophils Relative %: 60 %
Platelet Count: 215 10*3/uL (ref 150–400)
RBC: 4.67 MIL/uL (ref 3.87–5.11)
RDW: 14.3 % (ref 11.5–15.5)
WBC Count: 7 10*3/uL (ref 4.0–10.5)
nRBC: 0 % (ref 0.0–0.2)

## 2023-12-23 LAB — LACTATE DEHYDROGENASE: LDH: 189 U/L (ref 98–192)

## 2023-12-23 NOTE — Progress Notes (Signed)
 Hematology and Oncology Follow Up Visit  Michelle Long [id number] 04-16-1946 78 y.o. 12/23/2023   Principle Diagnosis:  Stage IIA (T2 N0M0) infiltrating ductal carcinoma of the left breast- 2008 Usual Ductal Hyperplasia of Right breast-- Complex sclerosing - 02/2021  - Lumpectomy- Dr. Carolynne Edouard- 02/2021   Current Therapy:   Tamoxifen 20 mg by mouth daily - finished in September 2017 after completing 8 years of therapy Femara 2.5 mg p.o. daily --started on 04/02/2021 following ductal hyperplasia finding     Interim History:  [name] is here for follow-up.   At her last visit she discussed SOB, chest pains, generalized aches/pains, brain fog that she was having. She was having to use her rescue inhaler often. She had evaluation including blood work, EKG, chest x ray without significant findings. She suspected her symptoms were due to her Femara. She ended up stopping the Femara between her last visit on 07/04/2023 and now. She says that once she stopped this medication these symptoms have resolved. She now only has to use her Symbicort and does not need to use her rescue inhaler. She reports that the difference has been substantial and she does not wish to go back on these medications again.   Last mammogram was on 08/21/2023 and was BI-RADs-3 She is due for her next DEXA scan on 04/21/2024. No recent fractures. She is taking OTC calcium   She is a former smoker  She has had no bleeding.  There is been no leg swelling.  She has had no rashes. No chest pains, SOB, night sweats, unintentional weight loss  The only thing she mentions today is a small soft bump that she has in her inguinal fold. This has been present for years- unchanged. Not painful and does not bleed.   Overall, her performance status is probably ECOG 1.   Wt Readings from Last 3 Encounters:  12/23/23 145 lb 0.6 oz (65.8 kg)  12/11/23 148 lb (67.1 kg)  09/30/23 148 lb 12.8 oz (67.5 kg)    Medications:  Allergies as of  12/23/2023       Reactions   Fluoxetine Other (See Comments)   seizure   Fluoxetine Hcl Other (See Comments)   seizure   Lamotrigine Itching, Other (See Comments)   itch   Sulfa Antibiotics Shortness Of Breath   Sulfa Antibiotics    Sulfa Drugs Cross Reactors Shortness Of Breath      Statins Other (See Comments)   Muscle pain Muscle pains, Muscle pains        Medication List        Accurate as of December 23, 2023  2:53 PM. If you have any questions, ask your nurse or doctor.          STOP taking these medications    letrozole 2.5 MG tablet Commonly known as: FEMARA Stopped by: Rushie Chestnut       TAKE these medications    albuterol 108 (90 Base) MCG/ACT inhaler Commonly known as: VENTOLIN HFA INHALE 2 PUFFS EVERY 6 HOURS AS NEEDED FOR WHEEZING OR SHORTNESS OF BREATH   alendronate 70 MG tablet Commonly known as: FOSAMAX TAKE 1 TABLET ONCE A WEEK WITH A FULL GLASS OF WATER ON AN EMPTY STOMACH   aspirin 81 MG tablet Take 81 mg by mouth daily.   budesonide-formoterol 160-4.5 MCG/ACT inhaler Commonly known as: Symbicort Inhale 2 puffs into the lungs 2 (two) times daily.   CALCIUM 1000 + D PO Take by mouth 2 (two) times  daily.   citalopram 10 MG tablet Commonly known as: CELEXA Take 0.5 tablets (5 mg total) by mouth at bedtime.   DropSafe Alcohol Prep 70 % Pads To use before checking blood sugars   ezetimibe 10 MG tablet Commonly known as: ZETIA Take 1 tablet (10 mg total) by mouth daily.   LORazepam 0.5 MG tablet Commonly known as: ATIVAN Take 0.5 mg by mouth daily as needed for anxiety.   losartan 50 MG tablet Commonly known as: COZAAR Take 1 tablet (50 mg total) by mouth daily.   metFORMIN 1000 MG tablet Commonly known as: GLUCOPHAGE Take 1 tablet (1,000 mg total) by mouth 2 (two) times daily with a meal.   MULTIVITAMIN ADULT PO Take 1 capsule by mouth daily at 6 (six) AM.   nateglinide 60 MG tablet Commonly known as: STARLIX Take 1  tablet (60 mg total) by mouth 2 (two) times daily with a meal.   OLANZapine 5 MG tablet Commonly known as: ZYPREXA 1/2 tablet daily What changed:  how much to take how to take this when to take this additional instructions   pantoprazole 40 MG tablet Commonly known as: PROTONIX TAKE 1 TABLET EVERY DAY   rosuvastatin 5 MG tablet Commonly known as: CRESTOR TAKE 1 TABLET THREE TIMES WEEKLY   Spacer/Aero Chamber Mouthpiece Misc To use with inhaler   True Metrix Air Glucose Meter w/Device Kit Check blood sugars once daily   True Metrix Blood Glucose Test test strip Generic drug: glucose blood TEST BLOOD SUGAR EVERY DAY   True Metrix Level 1 Low Soln USE AS DIRECTED WITH GLUCOSE METER   TRUEplus Lancets 33G Misc TEST BLOOD SUGAR EVERY DAY   Vitamin D 50 MCG (2000 UT) Caps Take 2,000 Units by mouth daily.        Allergies:  Allergies  Allergen Reactions   Fluoxetine Other (See Comments)    seizure   Fluoxetine Hcl Other (See Comments)    seizure   Lamotrigine Itching and Other (See Comments)    itch   Sulfa Antibiotics Shortness Of Breath   Sulfa Antibiotics    Sulfa Drugs Cross Reactors Shortness Of Breath        Statins Other (See Comments)    Muscle pain  Muscle pains, Muscle pains    Past Medical History, Surgical history, Social history, and Family History were reviewed and updated.  Review of Systems: Review of Systems  Constitutional: Negative.   HENT: Negative.    Eyes: Negative.   Respiratory: Negative.    Cardiovascular: Negative.   Gastrointestinal: Negative.   Genitourinary: Negative.   Musculoskeletal: Negative.   Skin: Negative.   Neurological: Negative.   Endo/Heme/Allergies: Negative.   Psychiatric/Behavioral: Negative.       Physical Exam:  height is 5\' 4"  (1.626 m) and weight is 145 lb 0.6 oz (65.8 kg). Her oral temperature is 98.1 F (36.7 C). Her blood pressure is 113/92 (abnormal) and her pulse is 100. Her respiration is  18 and oxygen saturation is 98%.   Wt Readings from Last 3 Encounters:  12/23/23 145 lb 0.6 oz (65.8 kg)  12/11/23 148 lb (67.1 kg)  09/30/23 148 lb 12.8 oz (67.5 kg)    Physical Exam Vitals reviewed.  Constitutional:      Comments: Breast Exam deferred today  HENT:     Head: Normocephalic and atraumatic.  Eyes:     Pupils: Pupils are equal, round, and reactive to light.  Cardiovascular:     Rate and Rhythm: Normal  rate and regular rhythm.     Heart sounds: Normal heart sounds.  Pulmonary:     Effort: Pulmonary effort is normal.     Breath sounds: Normal breath sounds.  Abdominal:     General: Bowel sounds are normal.     Palpations: Abdomen is soft.  Genitourinary:      Comments: No palpable lymphadenopathy  Musculoskeletal:        General: No tenderness or deformity. Normal range of motion.     Cervical back: Normal range of motion.  Lymphadenopathy:     Cervical: No cervical adenopathy.  Skin:    General: Skin is warm and dry.     Findings: No erythema or rash.  Neurological:     Mental Status: She is alert and oriented to person, place, and time.  Psychiatric:        Behavior: Behavior normal.        Thought Content: Thought content normal.        Judgment: Judgment normal.    Lab Results  Component Value Date   WBC 7.0 12/23/2023   HGB 13.6 12/23/2023   HCT 41.7 12/23/2023   MCV 89.3 12/23/2023   PLT 215 12/23/2023   No results found for: "FERRITIN", "IRON", "TIBC", "UIBC", "IRONPCTSAT" Lab Results  Component Value Date   RBC 4.67 12/23/2023   No results found for: "KPAFRELGTCHN", "LAMBDASER", "KAPLAMBRATIO" No results found for: "IGGSERUM", "IGA", "IGMSERUM" No results found for: "TOTALPROTELP", "ALBUMINELP", "A1GS", "A2GS", "BETS", "BETA2SER", "GAMS", "MSPIKE", "SPEI"   Chemistry      Component Value Date/Time   NA 141 12/23/2023 1131   NA 139 07/02/2017 1445   NA 142 01/01/2017 1059   K 4.5 12/23/2023 1131   K 4.6 07/02/2017 1445   K 4.6  01/01/2017 1059   CL 103 12/23/2023 1131   CL 103 07/02/2017 1445   CL 105 01/20/2015 1240   CO2 26 12/23/2023 1131   CO2 28 07/02/2017 1445   CO2 26 01/01/2017 1059   BUN 10 12/23/2023 1131   BUN 10 07/02/2017 1445   BUN 10.6 01/01/2017 1059   CREATININE 0.89 12/23/2023 1131   CREATININE 0.68 07/17/2020 1105   CREATININE 0.8 01/01/2017 1059      Component Value Date/Time   CALCIUM 10.4 (H) 12/23/2023 1131   CALCIUM 10.3 07/02/2017 1445   CALCIUM 10.1 01/01/2017 1059   ALKPHOS 35 (L) 12/23/2023 1131   ALKPHOS 47 07/02/2017 1445   ALKPHOS 49 01/01/2017 1059   AST 39 12/23/2023 1131   AST 60 (H) 01/01/2017 1059   ALT 40 12/23/2023 1131   ALT 63 (H) 01/01/2017 1059   BILITOT 0.5 12/23/2023 1131   BILITOT 0.64 01/01/2017 1059      Encounter Diagnoses  Name Primary?   Malignant neoplasm of lower-inner quadrant of left breast in female, estrogen receptor positive (HCC) Yes   Ductal hyperplasia of breast     Impression and Plan: Ms. Speirs is a delightful 78 year old postmenopausal white female. She had stage IIA ductal carcinoma the left breast in 2008. She completed tamoxifen in September 2017. She then had a ductal hyperplasia removed by Dr. Carolynne Edouard in 2022. She was placed on Femara but has had significant side effects and has since stopped this medication.   She is UTD on mammograms and has a DEXA scan planned for the summer The area described in her groin appears to be a simple cyst. Discussed red flags that would indicate that further evaluation would be indicated by GYN/Dermatology Calcium  is 0.1 above target. She will reduce her calcium supplement from BID to daily.   RTC 6 months APP, no labs needed    Rushie Chestnut, [place] 3/4/20252:53 PM

## 2024-01-09 ENCOUNTER — Other Ambulatory Visit: Payer: Self-pay | Admitting: Family Medicine

## 2024-01-09 DIAGNOSIS — J4520 Mild intermittent asthma, uncomplicated: Secondary | ICD-10-CM

## 2024-01-09 NOTE — Telephone Encounter (Signed)
 Copied from CRM [id number]. Topic: Clinical - Medication Question >> Jan 09, 2024  5:27 PM Tiffany S wrote: Reason for CRM: budesonide-formoterol [facility]) 160-4.5 MCG/ACT inhaler [[id number]]  Patient left inhaler home she went of town she is asking if another prescription can be called in at the [facility] below    [facility]  [address] [place], [place] [address] [phone]

## 2024-01-12 ENCOUNTER — Telehealth: Payer: Self-pay

## 2024-01-12 MED ORDER — BUDESONIDE-FORMOTEROL FUMARATE 160-4.5 MCG/ACT IN AERO
2.0000 | INHALATION_SPRAY | Freq: Two times a day (BID) | RESPIRATORY_TRACT | 1 refills | Status: DC
Start: 2024-01-12 — End: 2024-02-17

## 2024-01-12 NOTE — Telephone Encounter (Signed)
 Initial Comment [name] states that she is out of town and forgot her Symbicort. A new prescription was supposed to be called in to a local [facility] in [place], but they do not have it. She is having trouble breathing. Translation No Nurse Assessment Nurse: Mort Sawyers, RN, Cindy Date/Time (Eastern Time): 01/10/2024 9:39:37 AM Please select the assessment type ---Refill Does the patient have enough medication to last until the office opens? ---Unable to obtain loaner dose from Pharmacy Does the client directives allow for assistance with medications after hours? ---Yes Was the medication filled within the last 6 months? ---Yes What is the name of the medication, dose and instructions as listed on the bottle? ---symbicort 2 puffs in morning 2 at night Name of the physician as listed on the bottle. ---lowne chase , [place] Pharmacy name and phone number where most recently filled. ---[facility] [phone] Nurse: Mort Sawyers RN, Cindy Date/Time (Eastern Time): 01/10/2024 9:38:43 AM Confirm and document reason for call. If symptomatic, describe symptoms. ---[name] is out of maintenance inhaler Symbicort daily 2 puffs in day two at night . She is in [place] . Does the patient have any new or worsening symptoms? ---Yes Will a triage be completed? ---Yes Related visit to physician within the last 2 weeks? ---No PLEASE NOTE: All timestamps contained within this report are represented as [place] Standard Time. CONFIDENTIALTY NOTICE: This fax transmission is intended only for the addressee. It contains information that is legally privileged, confidential or otherwise protected from use or disclosure. If you are not the intended recipient, you are strictly prohibited from reviewing, disclosing, copying using or disseminating any of this information or taking any action in reliance on or regarding this information. If you have received this fax in error, please notify us immediately by telephone so that  we can arrange for its return to [place]. Phone: [phone], Toll-Free: [phone], Fax: [phone] Michelle Long [birth year] Page: 2 of 4 CallId: [id number] Nurse Assessment Does the PT have any chronic conditions? (i.e. diabetes, asthma, this includes High risk factors for pregnancy, etc.) ---Yes List chronic conditions. ---asthma Is this a behavioral health or substance abuse call? ---No Guidelines Guideline Title Affirmed Question Affirmed Notes Nurse Date/Time (Eastern Time) Asthma Attack [1] Wheezing or coughing AND [2] hasn't used neb or inhaler (up to 3 treatments given 20 minutes apart) AND [3] it's available Mort Sawyers, RN, Cindy 01/10/2024 9:41:19 AM Asthma Attack [1] Wheezing or coughing AND [2] hasn't used neb or inhaler (up to 3 treatments given 20 minutes apart) AND [3] it's available Mort Sawyers, RN, Cindy 01/10/2024 10:22:27 AM Asthma Attack [1] Mild wheezing comes and goes AND [2] present > 3 days Marchelle Gearing 01/10/2024 10:55:32 AM Disp. Time Lamount Cohen Time) Disposition Final User 01/10/2024 9:36:41 AM Send to Urgent Queue Brooke Pace 01/10/2024 9:58:47 AM Pharmacy Call Mort Sawyers, RN, Arline Asp Reason: rx. 01/10/2024 9:59:04 AM Urgent Home Treatment with FollowUp Call Marchelle Gearing 01/10/2024 9:59:51 AM Send To RN Personal Mort Sawyers, RN, Cindy 01/10/2024 10:26:45 AM Urgent Home Treatment with FollowUp Call [place], RN, Arline Asp 01/10/2024 10:27:55 AM Send To RN Personal Mort Sawyers, RN, Cindy 01/10/2024 11:02:05 AM SEE PCP WITHIN 3 DAYS Yes Mort Sawyers, RN, Arline Asp Final Disposition 01/10/2024 11:02:05 AM SEE PCP WITHIN 3 DAYS Yes [place], RN, Cindy PLEASE NOTE: All timestamps contained within this report are represented as [place] Standard Time. CONFIDENTIALTY NOTICE: This fax transmission is intended only for the addressee. It contains information that is legally privileged, confidential or otherwise protected from use or disclosure. If you are not the intended recipient,  you are strictly  prohibited from reviewing, disclosing, copying using or disseminating any of this information or taking any action in reliance on or regarding this information. If you have received this fax in error, please notify us immediately by telephone so that we can arrange for its return to [place]. Phone: [phone], Toll-Free: [phone], Fax: [phone] Michelle Long 1946/09/01 Page: 3 of 4 CallId: [id number] [name] Disagree/Comply Comply [name] Understands Yes PreDisposition InappropriateToAsk Care Advice Given Per Guideline * I'll call you back in 30-60 minutes to see how you are doing. * Note: You probably need to see a doctor right away if an asthma attack is not better after 2 or 3 quick-relief treatments (such as albuterol by inhaler or nebulizer) 20 minutes apart. CALL BACK IF: * You become worse before the nurse talks with you again on the follow-up call CARE ADVICE given per Asthma Attack (Adult) guideline. * Triager calls patient back in 30 to 60 minutes. CALL BACK IF: * The nurse hasn't called back within 60 minutes CARE ADVICE given per Asthma Attack (Adult) guideline. SEE PCP WITHIN 3 DAYS: * You need to be seen within 2 or 3 days. HAY FEVER AND ANTIHISTAMINE MEDICINES: * Reason: Poor control of allergic rhinitis makes asthma worse, but antihistamines don't make asthma worse. CALL BACK IF: * You become worse * Mild wheezing or other asthma symptoms come and go for more than 3 days CARE ADVICE given per Asthma Attack (Adult) guideline. Verbal Orders/Maintenance Medications Medication Refill Route Dosage Regime Duration Admin Instructions User Name symbicort 160/4.5 2 puff bid Yes Oral 2 puff bid 30 Days 2 puff bid Mort Sawyers, RN, Cindy Referrals REFERRED TO PCP OFFICE

## 2024-01-20 ENCOUNTER — Ambulatory Visit (HOSPITAL_BASED_OUTPATIENT_CLINIC_OR_DEPARTMENT_OTHER)
Admission: RE | Admit: 2024-01-20 | Discharge: 2024-01-20 | Disposition: A | Discharge status: Home / Self Care | Source: Ambulatory Visit | Attending: Family Medicine | Admitting: Family Medicine

## 2024-01-20 ENCOUNTER — Encounter: Payer: Self-pay | Admitting: Family Medicine

## 2024-01-20 ENCOUNTER — Ambulatory Visit: Payer: Self-pay

## 2024-01-20 ENCOUNTER — Ambulatory Visit (INDEPENDENT_AMBULATORY_CARE_PROVIDER_SITE_OTHER): Admitting: Family Medicine

## 2024-01-20 VITALS — BP 116/70 | HR 102 | Ht 64.0 in | Wt 146.0 lb

## 2024-01-20 DIAGNOSIS — M1812 Unilateral primary osteoarthritis of first carpometacarpal joint, left hand: Secondary | ICD-10-CM | POA: Diagnosis not present

## 2024-01-20 DIAGNOSIS — M7989 Other specified soft tissue disorders: Secondary | ICD-10-CM | POA: Diagnosis not present

## 2024-01-20 DIAGNOSIS — M25532 Pain in left wrist: Secondary | ICD-10-CM | POA: Diagnosis not present
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
## 2024-01-21 ENCOUNTER — Encounter: Payer: Self-pay | Admitting: Family Medicine

## 2024-01-30 ENCOUNTER — Other Ambulatory Visit (HOSPITAL_BASED_OUTPATIENT_CLINIC_OR_DEPARTMENT_OTHER): Payer: Self-pay

## 2024-01-30 ENCOUNTER — Ambulatory Visit (INDEPENDENT_AMBULATORY_CARE_PROVIDER_SITE_OTHER): Admitting: Family Medicine

## 2024-01-30 ENCOUNTER — Encounter: Payer: Self-pay | Admitting: Family Medicine

## 2024-01-30 VITALS — BP 128/60 | HR 111 | Temp 98.1°F | Resp 12 | Ht 64.0 in | Wt 147.2 lb

## 2024-01-30 DIAGNOSIS — R350 Frequency of micturition: Secondary | ICD-10-CM | POA: Diagnosis not present

## 2024-01-30 DIAGNOSIS — R300 Dysuria: Secondary | ICD-10-CM

## 2024-01-30 DIAGNOSIS — R319 Hematuria, unspecified: Secondary | ICD-10-CM

## 2024-01-30 DIAGNOSIS — N390 Urinary tract infection, site not specified: Secondary | ICD-10-CM

## 2024-01-30 LAB — POCT URINALYSIS DIP (MANUAL ENTRY)
Glucose, UA: NEGATIVE mg/dL
Nitrite, UA: POSITIVE — AB
Protein Ur, POC: 100 mg/dL — AB
Spec Grav, UA: 1.025 (ref 1.010–1.025)
Urobilinogen, UA: 0.2 U/dL
pH, UA: 6 (ref 5.0–8.0)

## 2024-01-30 MED ORDER — CEPHALEXIN 500 MG PO CAPS
500.0000 mg | ORAL_CAPSULE | Freq: Two times a day (BID) | ORAL | 0 refills | Status: DC
  Filled 2024-01-30: qty 20, 10d supply, fill #0

## 2024-01-30 MED ORDER — PHENAZOPYRIDINE HCL 200 MG PO TABS
200.0000 mg | ORAL_TABLET | Freq: Three times a day (TID) | ORAL | 0 refills | Status: AC | PRN
Start: 2024-01-30 — End: 2024-02-01
  Filled 2024-01-30: qty 6, 2d supply, fill #0

## 2024-01-30 NOTE — Progress Notes (Signed)
 Established Patient Office Visit  Subjective   Patient ID: Michelle Long, female    DOB: August 29, 1946  Age: 78 y.o. MRN: [id number]  Chief Complaint  Patient presents with   Dysuria   Urinary Frequency         HPI Discussed the use of AI scribe software for clinical note transcription with the patient, who gave verbal consent to proceed.  History of Present Illness Michelle Long is a 78 year old female who presents with symptoms suggestive of a urinary tract infection (UTI).  She has been experiencing symptoms consistent with a urinary tract infection for the past two to three days, including frequent urination, dysuria, and foul-smelling urine. She has a limited history of UTIs, having only had one or two in her lifetime.  No discharge, back pain, or fever. The burning sensation is internal during voiding and not felt externally. She also mentions a sensation of heaviness in her stomach when showering, but no significant abdominal pain.  She is allergic to sulfa drugs but not to penicillin.   Patient Active Problem List   Diagnosis Date Noted   Elevated liver enzymes 12/11/2023   Cardiomyopathy due to chemotherapy (HCC) 03/23/2021   Thrombocytopenia (HCC) 03/23/2021   Porokeratosis 09/01/2019   Type 2 diabetes mellitus with hyperglycemia, without long-term current use of insulin (HCC) 01/08/2019   Hyperlipidemia associated with type 2 diabetes mellitus (HCC) 01/08/2019   Impacted cerumen of left ear 12/10/2016   Essential hypertension 05/16/2016   Contact dermatitis 02/17/2015   Cancer of lower-inner quadrant of left female breast (HCC) 12/08/2013   Estrogen deficiency 10/22/2013   Athlete's foot 04/03/2013   Elevated BP 11/05/2012   Grief 10/06/2012   Fatigue 02/13/2012   Easy bruising 09/25/2011   Leg cramping 08/17/2011   Arm swelling 08/16/2011   Urinary tract infection with hematuria 06/04/2011   Vitamin D deficiency 05/29/2011   Preventative health care  05/29/2011   Hx of abnormal cervical Pap smear 05/29/2011   Dysuria 05/15/2011   Dry skin 05/15/2011   Asthma    Bladder cancer (HCC)    Bipolar disorder (HCC)    Inadequately controlled diabetes mellitus (HCC)    GERD (gastroesophageal reflux disease)    Past Medical History:  Diagnosis Date   Anxiety    Asthma    Bipolar disorder (HCC)    Bladder cancer (HCC)    Breast cancer (HCC)    Cancer of lower-inner quadrant of left female breast (HCC) 12/08/2013   COPD (chronic obstructive pulmonary disease) (HCC)    former smoker, quit 1999   Depression    Diabetes mellitus    GERD (gastroesophageal reflux disease)    Hepatitis B infection    History of transfusion of whole blood    Approximately 2009, due to breast cancer/chemo.   Hyperlipidemia    Hypertension    Personal history of chemotherapy    Personal history of radiation therapy    PONV (postoperative nausea and vomiting)    Past Surgical History:  Procedure Laterality Date   ABDOMINAL HYSTERECTOMY     APPENDECTOMY  1961   bladder cancer  2006   BREAST LUMPECTOMY Left 2007   BREAST LUMPECTOMY WITH RADIOACTIVE SEED LOCALIZATION Right 03/09/2021   Procedure: RIGHT BREAST LUMPECTOMY WITH RADIOACTIVE SEED LOCALIZATION;  Surgeon: Caralyn Chandler, MD;  Location: [facility];  Service: General;  Laterality: Right;   PARTIAL HYSTERECTOMY  1991   TONSILLECTOMY  1951   Social History   Tobacco  Use   Smoking status: Former    Current packs/day: 0.00    Average packs/day: 1.5 packs/day for 33.7 years (50.6 ttl pk-yrs)    Types: Cigarettes    Start date: 12/08/1964    Quit date: 08/21/1998    Years since quitting: 25.4   Smokeless tobacco: Never   Tobacco comments:    quit 16 years ago  Vaping Use   Vaping status: Never Used  Substance Use Topics   Alcohol use: Not Currently    Alcohol/week: 0.0 standard drinks of alcohol   Drug use: No   Social History   Socioeconomic History   Marital status:  Divorced    Spouse name: Not on file   Number of children: Not on file   Years of education: Not on file   Highest education level: Not on file  Occupational History   Occupation: retired    Comment: retired  Tobacco Use   Smoking status: Former    Current packs/day: 0.00    Average packs/day: 1.5 packs/day for 33.7 years (50.6 ttl pk-yrs)    Types: Cigarettes    Start date: 12/08/1964    Quit date: 08/21/1998    Years since quitting: 25.4   Smokeless tobacco: Never   Tobacco comments:    quit 16 years ago  Vaping Use   Vaping status: Never Used  Substance and Sexual Activity   Alcohol use: Not Currently    Alcohol/week: 0.0 standard drinks of alcohol   Drug use: No   Sexual activity: Not Currently    Partners: Male    Birth control/protection: Surgical  Other Topics Concern   Not on file  Social History Narrative   Exercise--- no   Social Drivers of Health   Financial Resource Strain: Low Risk  (10/09/2023)   Overall Financial Resource Strain (CARDIA)    Difficulty of Paying Living Expenses: Not hard at all  Food Insecurity: No Food Insecurity (10/09/2023)   Hunger Vital Sign    Worried About Running Out of Food in the Last Year: Never true    Ran Out of Food in the Last Year: Never true  Transportation Needs: No Transportation Needs (10/09/2023)   PRAPARE - Administrator, Civil Service (Medical): No    Lack of Transportation (Non-Medical): No  Physical Activity: Inactive (10/09/2023)   Exercise Vital Sign    Days of Exercise per Week: 0 days    Minutes of Exercise per Session: 0 min  Stress: No Stress Concern Present (10/09/2023)   [employer] of Occupational Health - Occupational Stress Questionnaire    Feeling of Stress : Not at all  Social Connections: Moderately Integrated (10/09/2023)   Social Connection and Isolation Panel [NHANES]    Frequency of Communication with Friends and Family: More than three times a week    Frequency of Social  Gatherings with Friends and Family: More than three times a week    Attends Religious Services: More than 4 times per year    Active Member of [employer] or Organizations: Yes    Attends Banker Meetings: More than 4 times per year    Marital Status: Divorced  Intimate Partner Violence: Not At Risk (10/09/2023)   Humiliation, Afraid, Rape, and Kick questionnaire    Fear of Current or Ex-Partner: No    Emotionally Abused: No    Physically Abused: No    Sexually Abused: No   Family Status  Relation Name Status   Mother  Deceased at age 66  natural causes   Father  Deceased at age 25       heart disease. cancer   Sister  Alive   Sister  Alive  No partnership data on file   Family History  Problem Relation Age of Onset   Arthritis Mother    Breast cancer Mother    Transient ischemic attack Mother    Hypertension Mother    Heart disease Father    Diabetes Father    Cancer Father        bladder cancer   Throat cancer Father    Breast cancer Sister    Allergies  Allergen Reactions   Fluoxetine Other (See Comments)    seizure   Fluoxetine Hcl Other (See Comments)    seizure   Lamotrigine Itching and Other (See Comments)    itch   Sulfa Antibiotics Shortness Of Breath   Sulfa Antibiotics    Sulfa Drugs Cross Reactors Shortness Of Breath        Statins Other (See Comments)    Muscle pain  Muscle pains, Muscle pains      Review of Systems  Constitutional:  Negative for chills, fever and malaise/fatigue.  HENT:  Negative for congestion.   Eyes:  Negative for blurred vision.  Respiratory:  Negative for shortness of breath.   Cardiovascular:  Negative for chest pain, palpitations and leg swelling.  Gastrointestinal:  Negative for abdominal pain, blood in stool and nausea.  Genitourinary:  Positive for dysuria and frequency.       + odor   Musculoskeletal:  Negative for back pain and falls.  Skin:  Negative for rash.  Neurological:  Negative for  dizziness, loss of consciousness and headaches.  Endo/Heme/Allergies:  Negative for environmental allergies.  Psychiatric/Behavioral:  Negative for depression. The patient is not nervous/anxious.       Objective:     BP 128/60 (BP Location: Right Arm, Patient Position: Sitting, Cuff Size: Normal)   Pulse (!) 111   Temp 98.1 F (36.7 C) (Oral)   Resp 12   Ht 5\' 4"  (1.626 m)   Wt 147 lb 3.2 oz (66.8 kg)   SpO2 92%   BMI 25.27 kg/m  BP Readings from Last 3 Encounters:  01/30/24 128/60  01/20/24 116/70  12/23/23 (!) 113/92   Wt Readings from Last 3 Encounters:  01/30/24 147 lb 3.2 oz (66.8 kg)  01/20/24 146 lb (66.2 kg)  12/23/23 145 lb 0.6 oz (65.8 kg)   SpO2 Readings from Last 3 Encounters:  01/30/24 92%  01/20/24 96%  12/23/23 98%      Physical Exam Vitals and nursing note reviewed.  Constitutional:      General: She is not in acute distress.    Appearance: Normal appearance. She is well-developed.  HENT:     Head: Normocephalic and atraumatic.  Eyes:     General: No scleral icterus.       Right eye: No discharge.        Left eye: No discharge.  Cardiovascular:     Rate and Rhythm: Normal rate and regular rhythm.     Heart sounds: No murmur heard. Pulmonary:     Effort: Pulmonary effort is normal. No respiratory distress.     Breath sounds: Normal breath sounds.  Musculoskeletal:        General: Normal range of motion.     Cervical back: Normal range of motion and neck supple.     Right lower leg: No edema.  Left lower leg: No edema.  Skin:    General: Skin is warm and dry.  Neurological:     Mental Status: She is alert and oriented to person, place, and time.  Psychiatric:        Mood and Affect: Mood normal.        Behavior: Behavior normal.        Thought Content: Thought content normal.        Judgment: Judgment normal.      Results for orders placed or performed in visit on 01/30/24  POCT urinalysis dipstick  Result Value Ref Range    Color, UA yellow yellow   Clarity, UA turbid (A) clear   Glucose, UA negative negative mg/dL   Bilirubin, UA moderate (A) negative   Ketones, POC UA trace (5) (A) negative mg/dL   Spec Grav, UA 5.784 6.962 - 1.025   Blood, UA moderate (A) negative   pH, UA 6.0 5.0 - 8.0   Protein Ur, POC =100 (A) negative mg/dL   Urobilinogen, UA 0.2 0.2 or 1.0 E.U./dL   Nitrite, UA Positive (A) Negative   Leukocytes, UA Large (3+) (A) Negative    Last CBC Lab Results  Component Value Date   WBC 7.0 12/23/2023   HGB 13.6 12/23/2023   HCT 41.7 12/23/2023   MCV 89.3 12/23/2023   MCH 29.1 12/23/2023   RDW 14.3 12/23/2023   PLT 215 12/23/2023   Last metabolic panel Lab Results  Component Value Date   GLUCOSE 132 (H) 12/23/2023   NA 141 12/23/2023   K 4.5 12/23/2023   CL 103 12/23/2023   CO2 26 12/23/2023   BUN 10 12/23/2023   CREATININE 0.89 12/23/2023   GFRNONAA >60 12/23/2023   CALCIUM 10.4 (H) 12/23/2023   PROT 7.3 12/23/2023   ALBUMIN 4.7 12/23/2023   LABGLOB 2.8 07/02/2017   AGRATIO 1.6 07/02/2017   BILITOT 0.5 12/23/2023   ALKPHOS 35 (L) 12/23/2023   AST 39 12/23/2023   ALT 40 12/23/2023   ANIONGAP 12 12/23/2023   Last lipids Lab Results  Component Value Date   CHOL 131 12/11/2023   HDL 50.60 12/11/2023   LDLCALC 59 12/11/2023   LDLDIRECT 145.6 06/22/2013   TRIG 109.0 12/11/2023   CHOLHDL 3 12/11/2023   Last hemoglobin A1c Lab Results  Component Value Date   HGBA1C 6.7 (H) 12/11/2023   Last thyroid functions Lab Results  Component Value Date   TSH 0.80 01/30/2015   Last vitamin D Lab Results  Component Value Date   VD25OH 67.63 12/25/2022   Last vitamin B12 and Folate No results found for: "VITAMINB12", "FOLATE"    The 10-year ASCVD risk score (Arnett DK, et al., 2019) is: 43.2%    Assessment & Plan:   Problem List Items Addressed This Visit       Unprioritized   Dysuria   Relevant Orders   POCT urinalysis dipstick (Completed)   Urine Culture    Urinary tract infection with hematuria - Primary   Relevant Medications   cephALEXin (KEFLEX) 500 MG capsule   phenazopyridine (PYRIDIUM) 200 MG tablet   Other Visit Diagnoses       Urinary frequency       Relevant Orders   POCT urinalysis dipstick (Completed)   Urine Culture     Assessment and Plan Assessment & Plan Urinary Tract Infection (UTI)   Symptoms of dysuria, frequency, and malodorous urine for 2-3 days are consistent with a UTI, confirmed by urinalysis. She is allergic  to sulfa drugs but not penicillin. Start Keflex (cephalexin) for 7 days, BID. Prescribe Pyridium for dysuria relief, noting potential orange urine discoloration. A urine sample is sent for culture to identify the causative organism and adjust antibiotics if needed. She has given informed consent for Keflex and Pyridium, understanding the side effects. Advise contacting the on-call physician if symptoms worsen over the weekend, as culture results should be available by Sunday. Review culture results on Monday to adjust treatment if necessary.     No follow-ups on file.    Angelene Rome R Lowne Chase, DO

## 2024-02-01 LAB — URINE CULTURE
MICRO NUMBER:: 16319475
SPECIMEN QUALITY:: ADEQUATE

## 2024-02-01 NOTE — Patient Instructions (Signed)

## 2024-02-06 ENCOUNTER — Encounter: Payer: Self-pay | Admitting: Family Medicine

## 2024-02-06 ENCOUNTER — Other Ambulatory Visit: Payer: Self-pay | Admitting: Family Medicine

## 2024-02-06 NOTE — Telephone Encounter (Signed)
 Copied from CRM [id number]. Topic: Clinical - Medication Refill >> Feb 06, 2024  2:53 PM Delana SAILOR wrote: Most Recent Primary Care Visit:  Provider: ANTONIO CYNDEE ROCKERS R  Department: [facility]  Visit Type: ACUTE  Date: 01/30/2024  Medication: alendronate  (FOSAMAX ) 70 MG tablet  Has the patient contacted their pharmacy? Yes (Agent: If no, request that the patient contact the pharmacy for the refill. If patient does not wish to contact the pharmacy document the reason why and proceed with request.) (Agent: If yes, when and what did the pharmacy advise?)  Is this the correct pharmacy for this prescription? Yes If no, delete pharmacy and type the correct one.  This is the patient's preferred pharmacy:   [id number] - [place], [place] - [address] [place] [address] Phone: [phone] Fax: [phone]    Has the prescription been filled recently? No  Is the patient out of the medication? Yes  Has the patient been seen for an appointment in the last year OR does the patient have an upcoming appointment? Yes  Can we respond through MyChart? No  Agent: Please be advised that Rx refills may take up to 3 business days. We ask that you follow-up with your pharmacy.

## 2024-02-09 ENCOUNTER — Other Ambulatory Visit: Payer: Self-pay | Admitting: Family Medicine

## 2024-02-15 ENCOUNTER — Encounter: Payer: Self-pay | Admitting: Family Medicine

## 2024-02-16 NOTE — Telephone Encounter (Signed)
 Please advise if we can send in this medication

## 2024-02-17 MED ORDER — BUDESONIDE-FORMOTEROL FUMARATE 160-4.5 MCG/ACT IN AERO
2.0000 | INHALATION_SPRAY | Freq: Two times a day (BID) | RESPIRATORY_TRACT | 2 refills | Status: DC
Start: 2024-02-17 — End: 2024-03-30

## 2024-03-08 ENCOUNTER — Telehealth: Payer: Self-pay | Admitting: *Deleted

## 2024-03-08 DIAGNOSIS — K219 Gastro-esophageal reflux disease without esophagitis: Secondary | ICD-10-CM

## 2024-03-08 MED ORDER — PANTOPRAZOLE SODIUM 40 MG PO TBEC: 40.0000 mg | DELAYED_RELEASE_TABLET | Freq: Every day | ORAL | 3 refills | Status: DC

## 2024-03-08 NOTE — Telephone Encounter (Signed)
 Rx sent.

## 2024-03-08 NOTE — Telephone Encounter (Signed)
 Copied from CRM [id number]. Topic: Clinical - Prescription Issue >> Mar 08, 2024 10:48 AM Juleen Oakland F wrote: Reason for CRM: Patient has new insurance with [employer] and needs the pantoprazole  (PROTONIX ) 40 MG tablet medication called verbally into [facility] at [phone]

## 2024-03-16 ENCOUNTER — Other Ambulatory Visit: Payer: Self-pay | Admitting: Family Medicine

## 2024-03-16 NOTE — Telephone Encounter (Unsigned)
 Copied from CRM [id number]. Topic: Clinical - Medication Refill >> Mar 16, 2024  3:50 PM Taleah C wrote: Medication: nateglinide  & rosuvastatin   Has the patient contacted their pharmacy? Yes  They stated that they haven't heard back from the clinic.  This is the patient's preferred pharmacy:  [facility] [place], [place] - [address] [place] [address] Phone: [phone] Fax: [phone]   Is this the correct pharmacy for this prescription? Yes If no, delete pharmacy and type the correct one.   Has the prescription been filled recently? No  Is the patient out of the medication? No  Has the patient been seen for an appointment in the last year OR does the patient have an upcoming appointment? Yes  Can we respond through MyChart? No  Agent: Please be advised that Rx refills may take up to 3 business days. We ask that you follow-up with your pharmacy.

## 2024-03-17 MED ORDER — NATEGLINIDE 60 MG PO TABS: 60.0000 mg | ORAL_TABLET | Freq: Two times a day (BID) | ORAL | 0 refills | Status: DC

## 2024-03-17 MED ORDER — ROSUVASTATIN CALCIUM 5 MG PO TABS: 5.0000 mg | ORAL_TABLET | ORAL | 0 refills | Status: DC

## 2024-03-30 ENCOUNTER — Ambulatory Visit (INDEPENDENT_AMBULATORY_CARE_PROVIDER_SITE_OTHER): Payer: Medicare HMO | Admitting: Family Medicine

## 2024-03-30 ENCOUNTER — Encounter: Payer: Self-pay | Admitting: Family Medicine

## 2024-03-30 VITALS — BP 132/66 | HR 110 | Temp 97.8°F | Wt 147.8 lb

## 2024-03-30 DIAGNOSIS — J4520 Mild intermittent asthma, uncomplicated: Secondary | ICD-10-CM | POA: Diagnosis not present

## 2024-03-30 DIAGNOSIS — I10 Essential (primary) hypertension: Secondary | ICD-10-CM

## 2024-03-30 DIAGNOSIS — E1165 Type 2 diabetes mellitus with hyperglycemia: Secondary | ICD-10-CM

## 2024-03-30 DIAGNOSIS — E1169 Type 2 diabetes mellitus with other specified complication: Secondary | ICD-10-CM | POA: Diagnosis not present

## 2024-03-30 DIAGNOSIS — E785 Hyperlipidemia, unspecified: Secondary | ICD-10-CM | POA: Diagnosis not present

## 2024-03-30 LAB — COMPREHENSIVE METABOLIC PANEL WITH GFR
ALT: 34 U/L (ref 0–35)
AST: 31 U/L (ref 0–37)
Albumin: 4.4 g/dL (ref 3.5–5.2)
Alkaline Phosphatase: 35 U/L — ABNORMAL LOW (ref 39–117)
BUN: 12 mg/dL (ref 6–23)
CO2: 28 meq/L (ref 19–32)
Calcium: 10 mg/dL (ref 8.4–10.5)
Chloride: 104 meq/L (ref 96–112)
Creatinine, Ser: 0.78 mg/dL (ref 0.40–1.20)
GFR: 72.97 mL/min (ref >60.00)
Glucose, Bld: 122 mg/dL — ABNORMAL HIGH (ref 70–99)
Potassium: 4.8 meq/L (ref 3.5–5.1)
Sodium: 140 meq/L (ref 135–145)
Total Bilirubin: 0.5 mg/dL (ref 0.2–1.2)
Total Protein: 7.1 g/dL (ref 6.0–8.3)

## 2024-03-30 LAB — LIPID PANEL
Cholesterol: 179 mg/dL (ref 0–200)
HDL: 50.9 mg/dL (ref >39.00)
LDL Cholesterol: 102 mg/dL — ABNORMAL HIGH (ref 0–99)
NonHDL: 128.03
Total CHOL/HDL Ratio: 4
Triglycerides: 129 mg/dL (ref 0.0–149.0)
VLDL: 25.8 mg/dL (ref 0.0–40.0)

## 2024-03-30 LAB — CBC WITH DIFFERENTIAL/PLATELET
Basophils Absolute: 0.1 10*3/uL (ref 0.0–0.1)
Basophils Relative: 0.8 % (ref 0.0–3.0)
Eosinophils Absolute: 0.2 10*3/uL (ref 0.0–0.7)
Eosinophils Relative: 2.5 % (ref 0.0–5.0)
HCT: 41.5 % (ref 36.0–46.0)
Hemoglobin: 13.2 g/dL (ref 12.0–15.0)
Lymphocytes Relative: 18.5 % (ref 12.0–46.0)
Lymphs Abs: 1.2 10*3/uL (ref 0.7–4.0)
MCHC: 31.9 g/dL (ref 30.0–36.0)
MCV: 87.7 fl (ref 78.0–100.0)
Monocytes Absolute: 0.6 10*3/uL (ref 0.1–1.0)
Monocytes Relative: 8.9 % (ref 3.0–12.0)
Neutro Abs: 4.3 10*3/uL (ref 1.4–7.7)
Neutrophils Relative %: 69.3 % (ref 43.0–77.0)
Platelets: 221 10*3/uL (ref 150.0–400.0)
RBC: 4.73 Mil/uL (ref 3.87–5.11)
RDW: 15.1 % (ref 11.5–15.5)
WBC: 6.3 10*3/uL (ref 4.0–10.5)

## 2024-03-30 LAB — HEMOGLOBIN A1C: Hgb A1c MFr Bld: 6.8 % — ABNORMAL HIGH (ref 4.6–6.5)

## 2024-03-30 MED ORDER — BUDESONIDE-FORMOTEROL FUMARATE 160-4.5 MCG/ACT IN AERO: 2.0000 | INHALATION_SPRAY | Freq: Two times a day (BID) | RESPIRATORY_TRACT | 2 refills | Status: DC

## 2024-03-30 NOTE — Assessment & Plan Note (Signed)
 Stable  Con't symbicort  and albuterol 

## 2024-03-30 NOTE — Assessment & Plan Note (Signed)
 hgba1c to be checked, minimize simple carbs. Increase exercise as tolerated. Continue current meds

## 2024-03-30 NOTE — Assessment & Plan Note (Signed)
 Well controlled, no changes to meds. Encouraged heart healthy diet such as the DASH diet and exercise as tolerated.

## 2024-03-30 NOTE — Progress Notes (Signed)
 Established Patient Office Visit  Subjective   Patient ID: Michelle Long, female    DOB: July 07, 1946  Age: 78 y.o. MRN: [id number]  Chief Complaint  Patient presents with   Medical Management of Chronic Issues    Pt is here for her 47m f/u. Pt requesting a generic of symbicort  to be send to [facility].     HPI Discussed the use of AI scribe software for clinical note transcription with the patient, who gave verbal consent to proceed.  History of Present Illness Michelle Long is a 78 year old female who presents for medication management and family stress related to her daughter's mental health issues.  She is experiencing issues with her medication management, particularly with her Symbicort  prescription. While out of town, her Symbicort  was substituted with a generic version, which she found concerning due to cost differences. She prefers to continue with Symbicort  from [facility], where it is more affordable. Additionally, she requires a refill for citalopram , which is managed by another provider.  She is under significant stress due to her daughter's mental health challenges. Her daughter has bipolar disorder and anxiety, and is currently living in temporary accommodations that are financially burdensome. She is actively involved in her daughter's care, providing financial support and meals. Her daughter is not working and is awaiting SSI approval. She finds it difficult to manage the situation as her daughter is resistant to taking prescribed medications like Risperdal and Vraylar, which are intended to manage her bipolar disorder and sleep issues.  Her social history is significantly impacted by her daughter's condition, causing strain within the family. Her youngest daughter is frustrated with the situation, and her ex-husband, while financially supportive, has distanced himself emotionally. She is considering relocating to alleviate the stress but is conflicted about leaving her  daughter. She has sought therapy for herself but is dissatisfied with the advice received.  She does not check her blood sugars regularly and reports significant life stress since March due to her daughter's situation.   Patient Active Problem List   Diagnosis Date Noted   Elevated liver enzymes 12/11/2023   Cardiomyopathy due to chemotherapy (HCC) 03/23/2021   Thrombocytopenia (HCC) 03/23/2021   Porokeratosis 09/01/2019   Type 2 diabetes mellitus with hyperglycemia, without long-term current use of insulin (HCC) 01/08/2019   Hyperlipidemia associated with type 2 diabetes mellitus (HCC) 01/08/2019   Impacted cerumen of left ear 12/10/2016   Essential hypertension 05/16/2016   Contact dermatitis 02/17/2015   Cancer of lower-inner quadrant of left female breast (HCC) 12/08/2013   Estrogen deficiency 10/22/2013   Athlete's foot 04/03/2013   Elevated BP 11/05/2012   Grief 10/06/2012   Fatigue 02/13/2012   Easy bruising 09/25/2011   Leg cramping 08/17/2011   Arm swelling 08/16/2011   Urinary tract infection with hematuria 06/04/2011   Vitamin D  deficiency 05/29/2011   Preventative health care 05/29/2011   Hx of abnormal cervical Pap smear 05/29/2011   Dysuria 05/15/2011   Dry skin 05/15/2011   Asthma    Bladder cancer (HCC)    Bipolar disorder (HCC)    Inadequately controlled diabetes mellitus (HCC)    GERD (gastroesophageal reflux disease)    Past Medical History:  Diagnosis Date   Anxiety    Asthma    Bipolar disorder (HCC)    Bladder cancer (HCC)    Breast cancer (HCC)    Cancer of lower-inner quadrant of left female breast (HCC) 12/08/2013   COPD (chronic obstructive pulmonary disease) (HCC)  former smoker, quit 1999   Depression    Diabetes mellitus    GERD (gastroesophageal reflux disease)    Hepatitis B infection    History of transfusion of whole blood    Approximately 2009, due to breast cancer/chemo.   Hyperlipidemia    Hypertension    Personal history of  chemotherapy    Personal history of radiation therapy    PONV (postoperative nausea and vomiting)    Past Surgical History:  Procedure Laterality Date   ABDOMINAL HYSTERECTOMY     APPENDECTOMY  1961   bladder cancer  2006   BREAST LUMPECTOMY Left 2007   BREAST LUMPECTOMY WITH RADIOACTIVE SEED LOCALIZATION Right 03/09/2021   Procedure: RIGHT BREAST LUMPECTOMY WITH RADIOACTIVE SEED LOCALIZATION;  Surgeon: Caralyn Chandler, MD;  Location: [facility];  Service: General;  Laterality: Right;   PARTIAL HYSTERECTOMY  1991   TONSILLECTOMY  1951   Social History   Tobacco Use   Smoking status: Former    Current packs/day: 0.00    Average packs/day: 1.5 packs/day for 33.7 years (50.6 ttl pk-yrs)    Types: Cigarettes    Start date: 12/08/1964    Quit date: 08/21/1998    Years since quitting: 25.6   Smokeless tobacco: Never   Tobacco comments:    quit 16 years ago  Vaping Use   Vaping status: Never Used  Substance Use Topics   Alcohol  use: Not Currently    Alcohol /week: 0.0 standard drinks of alcohol    Drug use: No   Social History   Socioeconomic History   Marital status: Divorced    Spouse name: Not on file   Number of children: Not on file   Years of education: Not on file   Highest education level: Not on file  Occupational History   Occupation: retired    Comment: retired  Tobacco Use   Smoking status: Former    Current packs/day: 0.00    Average packs/day: 1.5 packs/day for 33.7 years (50.6 ttl pk-yrs)    Types: Cigarettes    Start date: 12/08/1964    Quit date: 08/21/1998    Years since quitting: 25.6   Smokeless tobacco: Never   Tobacco comments:    quit 16 years ago  Vaping Use   Vaping status: Never Used  Substance and Sexual Activity   Alcohol  use: Not Currently    Alcohol /week: 0.0 standard drinks of alcohol    Drug use: No   Sexual activity: Not Currently    Partners: Male    Birth control/protection: Surgical  Other Topics Concern   Not on  file  Social History Narrative   Exercise--- no   Social Drivers of Health   Financial Resource Strain: Low Risk  (10/09/2023)   Overall Financial Resource Strain (CARDIA)    Difficulty of Paying Living Expenses: Not hard at all  Food Insecurity: No Food Insecurity (10/09/2023)   Hunger Vital Sign    Worried About Running Out of Food in the Last Year: Never true    Ran Out of Food in the Last Year: Never true  Transportation Needs: No Transportation Needs (10/09/2023)   PRAPARE - Administrator, Civil Service (Medical): No    Lack of Transportation (Non-Medical): No  Physical Activity: Inactive (10/09/2023)   Exercise Vital Sign    Days of Exercise per Week: 0 days    Minutes of Exercise per Session: 0 min  Stress: No Stress Concern Present (10/09/2023)   [employer] of Occupational Health -  Occupational Stress Questionnaire    Feeling of Stress : Not at all  Social Connections: Moderately Integrated (10/09/2023)   Social Connection and Isolation Panel [NHANES]    Frequency of Communication with Friends and Family: More than three times a week    Frequency of Social Gatherings with Friends and Family: More than three times a week    Attends Religious Services: More than 4 times per year    Active Member of [employer] or Organizations: Yes    Attends Banker Meetings: More than 4 times per year    Marital Status: Divorced  Intimate Partner Violence: Not At Risk (10/09/2023)   Humiliation, Afraid, Rape, and Kick questionnaire    Fear of Current or Ex-Partner: No    Emotionally Abused: No    Physically Abused: No    Sexually Abused: No   Family Status  Relation Name Status   Mother  Deceased at age 48       natural causes   Father  Deceased at age 64       heart disease. cancer   Sister  Alive   Sister  Alive  No partnership data on file   Family History  Problem Relation Age of Onset   Arthritis Mother    Breast cancer Mother    Transient  ischemic attack Mother    Hypertension Mother    Heart disease Father    Diabetes Father    Cancer Father        bladder cancer   Throat cancer Father    Breast cancer Sister    Allergies  Allergen Reactions   Fluoxetine Other (See Comments)    seizure   Fluoxetine Hcl Other (See Comments)    seizure   Lamotrigine Itching and Other (See Comments)    itch   Sulfa Antibiotics Shortness Of Breath   Sulfa Antibiotics    Sulfa Drugs Cross Reactors Shortness Of Breath        Statins Other (See Comments)    Muscle pain  Muscle pains, Muscle pains      Review of Systems  Constitutional:  Negative for fever and malaise/fatigue.  HENT:  Negative for congestion.   Eyes:  Negative for blurred vision.  Respiratory:  Negative for shortness of breath.   Cardiovascular:  Negative for chest pain, palpitations and leg swelling.  Gastrointestinal:  Negative for abdominal pain, blood in stool and nausea.  Genitourinary:  Negative for dysuria and frequency.  Musculoskeletal:  Negative for falls.  Skin:  Negative for rash.  Neurological:  Negative for dizziness, loss of consciousness and headaches.  Endo/Heme/Allergies:  Negative for environmental allergies.  Psychiatric/Behavioral:  Negative for depression. The patient is not nervous/anxious.        Objective:     BP 132/66   Pulse (!) 110   Temp 97.8 F (36.6 C)   Wt 147 lb 12.8 oz (67 kg)   SpO2 96%   BMI 25.37 kg/m  BP Readings from Last 3 Encounters:  03/30/24 132/66  01/30/24 128/60  01/20/24 116/70   Wt Readings from Last 3 Encounters:  03/30/24 147 lb 12.8 oz (67 kg)  01/30/24 147 lb 3.2 oz (66.8 kg)  01/20/24 146 lb (66.2 kg)   SpO2 Readings from Last 3 Encounters:  03/30/24 96%  01/30/24 92%  01/20/24 96%      Physical Exam Vitals and nursing note reviewed.  Constitutional:      General: She is not in acute distress.  Appearance: Normal appearance. She is well-developed.  HENT:     Head:  Normocephalic and atraumatic.  Eyes:     General: No scleral icterus.       Right eye: No discharge.        Left eye: No discharge.  Cardiovascular:     Rate and Rhythm: Normal rate and regular rhythm.     Heart sounds: No murmur heard. Pulmonary:     Effort: Pulmonary effort is normal. No respiratory distress.     Breath sounds: Normal breath sounds.  Musculoskeletal:        General: Normal range of motion.     Cervical back: Normal range of motion and neck supple.     Right lower leg: No edema.     Left lower leg: No edema.  Skin:    General: Skin is warm and dry.  Neurological:     Mental Status: She is alert and oriented to person, place, and time.  Psychiatric:        Mood and Affect: Mood normal.        Behavior: Behavior normal.        Thought Content: Thought content normal.        Judgment: Judgment normal.      No results found for any visits on 03/30/24.  Last CBC Lab Results  Component Value Date   WBC 7.0 12/23/2023   HGB 13.6 12/23/2023   HCT 41.7 12/23/2023   MCV 89.3 12/23/2023   MCH 29.1 12/23/2023   RDW 14.3 12/23/2023   PLT 215 12/23/2023   Last metabolic panel Lab Results  Component Value Date   GLUCOSE 132 (H) 12/23/2023   NA 141 12/23/2023   K 4.5 12/23/2023   CL 103 12/23/2023   CO2 26 12/23/2023   BUN 10 12/23/2023   CREATININE 0.89 12/23/2023   GFRNONAA >60 12/23/2023   CALCIUM  10.4 (H) 12/23/2023   PROT 7.3 12/23/2023   ALBUMIN 4.7 12/23/2023   LABGLOB 2.8 07/02/2017   AGRATIO 1.6 07/02/2017   BILITOT 0.5 12/23/2023   ALKPHOS 35 (L) 12/23/2023   AST 39 12/23/2023   ALT 40 12/23/2023   ANIONGAP 12 12/23/2023   Last lipids Lab Results  Component Value Date   CHOL 131 12/11/2023   HDL 50.60 12/11/2023   LDLCALC 59 12/11/2023   LDLDIRECT 145.6 06/22/2013   TRIG 109.0 12/11/2023   CHOLHDL 3 12/11/2023   Last hemoglobin A1c Lab Results  Component Value Date   HGBA1C 6.7 (H) 12/11/2023   Last thyroid  functions Lab  Results  Component Value Date   TSH 0.80 01/30/2015   Last vitamin D  Lab Results  Component Value Date   VD25OH 67.63 12/25/2022   Last vitamin B12 and Folate No results found for: "VITAMINB12", "FOLATE"    The 10-year ASCVD risk score (Arnett DK, et al., 2019) is: 45.3%    Assessment & Plan:   Problem List Items Addressed This Visit       Unprioritized   Hyperlipidemia associated with type 2 diabetes mellitus (HCC)   Relevant Orders   Lipid panel   CBC with Differential/Platelet   Comprehensive metabolic panel with GFR   Hemoglobin A1c   Type 2 diabetes mellitus with hyperglycemia, without long-term current use of insulin (HCC)   hgba1c to be checked, minimize simple carbs. Increase exercise as tolerated. Continue current meds       Relevant Orders   Lipid panel   CBC with Differential/Platelet   Comprehensive metabolic panel  with GFR   Hemoglobin A1c   Microalbumin / creatinine urine ratio   Essential hypertension - Primary   Well controlled, no changes to meds. Encouraged heart healthy diet such as the DASH diet and exercise as tolerated.        Relevant Orders   Lipid panel   CBC with Differential/Platelet   Comprehensive metabolic panel with GFR   Hemoglobin A1c   Asthma   Stable  Con't symbicort  and albuterol         Relevant Medications   budesonide -formoterol  (BREYNA ) 160-4.5 MCG/ACT inhaler   Assessment and Plan Assessment & Plan Depression   She is experiencing significant stress and emotional burden due to her daughter's ongoing mental health issues since March, resulting in prolonged distress. Currently on citalopram , managed by Dr. Audie Leander office, and has requested a refill. She faces financial and emotional strain supporting her daughter with bipolar disorder and anxiety, who is resistant to prescribed medications. Refill citalopram  prescription. Encourage continued therapy and support for managing stress related to her daughter's  situation.  Asthma   She is currently using Symbicort  for asthma management. During a recent trip, she used a generic version but prefers Symbicort  from [facility] due to its affordability at $47 for a 30-day supply. No acute exacerbations or issues with asthma control reported. Prescribe Symbicort  and send the prescription to [facility] on [address] for a 30-day supply.   No follow-ups on file.    Keyandra Swenson R Lowne Chase, DO

## 2024-03-31 ENCOUNTER — Other Ambulatory Visit: Payer: Self-pay | Admitting: Family Medicine

## 2024-03-31 LAB — MICROALBUMIN / CREATININE URINE RATIO
Creatinine,U: 127.4 mg/dL
Microalb Creat Ratio: 38.5 mg/g — ABNORMAL HIGH (ref 0.0–30.0)
Microalb, Ur: 4.9 mg/dL — ABNORMAL HIGH (ref 0.0–1.9)

## 2024-04-05 ENCOUNTER — Ambulatory Visit: Payer: Self-pay | Admitting: Family Medicine

## 2024-04-05 DIAGNOSIS — Z1389 Encounter for screening for other disorder: Secondary | ICD-10-CM | POA: Diagnosis not present

## 2024-04-08 ENCOUNTER — Telehealth: Payer: Self-pay

## 2024-04-08 DIAGNOSIS — Z780 Asymptomatic menopausal state: Secondary | ICD-10-CM

## 2024-04-08 NOTE — Telephone Encounter (Signed)
 Copied from CRM [id number]. Topic: Clinical - Request for Lab/Test Order >> Apr 08, 2024  2:20 PM Marlan Silva wrote: Reason for CRM: Patient calling  in to request a bone density order get sent over to Radiology Dept @ [address] in [place]  Fax: [phone]

## 2024-04-14 NOTE — Addendum Note (Signed)
 Addended by: Shonica Weier D on: 04/14/2024 11:33 AM   Modules accepted: Orders

## 2024-04-14 NOTE — Telephone Encounter (Signed)
 Order faxed.

## 2024-04-21 ENCOUNTER — Other Ambulatory Visit: Payer: Medicare HMO

## 2024-04-25 ENCOUNTER — Other Ambulatory Visit: Payer: Self-pay | Admitting: Family Medicine

## 2024-05-04 DIAGNOSIS — M85852 Other specified disorders of bone density and structure, left thigh: Secondary | ICD-10-CM | POA: Diagnosis not present

## 2024-05-04 LAB — HM DEXA SCAN

## 2024-05-04 NOTE — Telephone Encounter (Signed)
 Copied from CRM [id number]. Topic: Clinical - Home Health Verbal Orders >> May 03, 2024  4:23 PM Delon DASEN wrote: [name]/Agency: Marquita with [facility] Callback Number: [phone] Service Requested: Skilled Nursing Frequency: n/a Any new concerns about the patient? Yes- patient having to use rescue inhaler more and getting short of breath- needs a spirometry test done

## 2024-05-05 ENCOUNTER — Encounter: Payer: Self-pay | Admitting: Family Medicine

## 2024-05-10 ENCOUNTER — Telehealth: Payer: Self-pay

## 2024-05-10 NOTE — Telephone Encounter (Signed)
 Copied from CRM [id number]. Topic: Clinical - Lab/Test Results >> May 10, 2024 10:35 AM Charlet HERO wrote: Reason for CRM: Patient is calling about the bone density test and she would like someone to call her back and go over the results with her.

## 2024-05-11 ENCOUNTER — Other Ambulatory Visit: Payer: Self-pay | Admitting: Family Medicine

## 2024-05-11 ENCOUNTER — Encounter: Payer: Self-pay | Admitting: Family Medicine

## 2024-05-11 DIAGNOSIS — Z5181 Encounter for therapeutic drug level monitoring: Secondary | ICD-10-CM | POA: Diagnosis not present

## 2024-05-11 DIAGNOSIS — Z1389 Encounter for screening for other disorder: Secondary | ICD-10-CM | POA: Diagnosis not present

## 2024-05-11 DIAGNOSIS — I10 Essential (primary) hypertension: Secondary | ICD-10-CM

## 2024-05-12 NOTE — Telephone Encounter (Signed)
Mychart message sent to pcp.

## 2024-05-13 NOTE — Telephone Encounter (Signed)
 Care everywhere  DEXA Scan 1 or More Sites Axial (05/04/2024 11:57 AM EDT) Imaging Results - DEXA Scan 1 or More Sites Axial (05/04/2024 11:57 AM EDT) Anatomical Region Laterality Modality      Radiographic Imaging   Imaging Results - DEXA Scan 1 or More Sites Axial (05/04/2024 11:57 AM EDT) Specimen (Source) Anatomical Location / Laterality Collection Method / Volume Collection Time Received Time        05/04/2024 3:44 PM EDT     Imaging Results - DEXA Scan 1 or More Sites Axial (05/04/2024 11:57 AM EDT) Impressions  05/05/2024 10:19 AM EDT  1. Diagnosis: Osteopenia. 2. Fracture Risk: Increased risk. FRAX not applicable in the setting of treatment. 3. Monitoring: No prior studies are available for comparison. 4. Followup: Consider follow up DXA in 2 years to evaluate response to treatment.      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  CLINICAL HISTORY: Age: 78 years Race: White   Gender: Female Menopausal status: Post-menopausal Risk factors: None. Osteoporosis therapy: Fosamax  (Alendronate )  TECHNIQUE: The study was performed using [employer] W bone densitometer.  FINDINGS:  LUMBAR SPINE .  BMD measured in L1-L2 region of interest is 1.130 g/cm2. .  T-score: 1.4 .  Z-score: 3.8 .  Comments: L3-L4 excluded due to degenerative sclerosis.  FEMORAL NECK .  BMD measured in left femoral neck region of interest is 0.720 g/cm2. .  T-score: -1.2 .  Z-score: 1.1 .  Comments: None.  TOTAL HIP .  BMD measured in left total hip region of interest is 0.910 g/cm2. .  T-score: -0.3 .  Z-score: 1.7 .  Comments: None.   PLEASE NOTE .  Note 1: WHO classification: The T-score compares the patient's BMD to the average BMD of a young adult. The  criteria below are from the World Health Organization: 1.  Normal: T-score -1.0 or above. 2.  Osteopenia/low bone mass: T-score -1.1 to -2.4. 3.  Osteoporosis: T-score -2.5 or lower. 4.  Severe or established osteoporosis: T-score -2.5 or lower plus fragility fracture.  .  Note 2: According to the International Society for Clinical Densitometry's 2013 consensus conference, in women prior to menopause and men less than age 69: 1.  Z-scores, not T-scores are preferred. This is particularly important in children. 2.  A Z-score of -2.0 or lower is defined as 'below the expected range for age' and a Z-score above -2.0 is 'within the expected range for age'. 3.  The WHO diagnostic criteria may be applied in women in the menopausal transition. 4.  Osteoporosis cannot be diagnosed in men under age 22 on the basis of BMD alone.  .  Note 3: Approaches to reduce osteoporosis related fracture risk include optimizing calcium  and vitamin D  status and fall prevention measures. The National Osteoporosis Foundation (ContactDrugs.es) recommends: 1.  Initiate pharmacologic treatment in those with hip or vertebral fractures (clinical or asymptomatic). 2.  Initiate therapy in those with T-scores less than or equal to -2.5 at the femoral neck, total hip or lumbar spine by DXA, after appropriate evaluation. 3.  Initiate treatment in postmenopausal women and men age 13 or older with low bone mass (T-score between -1.0 and 2.5, osteopenia) at the femoral neck, total hip or lumbar spine by DXA and a 10-year hip fracture probability  greater than or equal to 3% or a 10-year major osteoporosis-related fracture probability of greater than or equal to 20% based on the U.[place] adapted WHO absolute fracture risk model (FRAX: www.CouponChronicle.com.au and www.shef.ac.uk/FRAX).  All treatment decisions require clinical judgment and consideration of individual factors including  patient preferences, comorbidities, prior drug use, risk factors not captured in the FRAX model (e.g. sarcopenia, falls, vitamin D  deficiency, increased bone turnover, interval significant decline in bone density) and possible under or over estimation of fracture risk by FRAX.  The [employer] of Rheumatology guidelines for patients receiving glucocorticoid therapy can be found at: http://www.rheumatology.org/practice/clinical/guidelines/ACR_2010_GIOP_Recomm_Clinicians_Guide.pdf  .  Note 4: At this facility, the least significant change in BMD with 95% confidence is 0.04 g/cm2 at the lumbar spine or total hip.

## 2024-05-14 ENCOUNTER — Other Ambulatory Visit: Payer: Self-pay | Admitting: Family Medicine

## 2024-05-20 ENCOUNTER — Encounter: Payer: Self-pay | Admitting: Family Medicine

## 2024-05-27 ENCOUNTER — Telehealth: Payer: Self-pay

## 2024-05-27 NOTE — Telephone Encounter (Signed)
 Copied from CRM [id number]. Topic: General - Other >> May 27, 2024 11:26 AM Rosina BIRCH wrote: Reason for CRM: stephanie from [facility] is calling stating she need mammogram dates from 2018 and up for the patient [phone]

## 2024-06-03 ENCOUNTER — Other Ambulatory Visit: Payer: Self-pay | Admitting: Family Medicine

## 2024-06-04 ENCOUNTER — Telehealth: Payer: Self-pay | Admitting: Family Medicine

## 2024-06-04 ENCOUNTER — Other Ambulatory Visit: Payer: Self-pay | Admitting: Family Medicine

## 2024-06-04 DIAGNOSIS — J4520 Mild intermittent asthma, uncomplicated: Secondary | ICD-10-CM

## 2024-06-04 NOTE — Addendum Note (Signed)
 Addended by: ANTONIO CYNDEE ROCKERS R on: 06/04/2024 04:44 PM   Modules accepted: Orders

## 2024-06-04 NOTE — Telephone Encounter (Signed)
 Copied from CRM #[id number]. Topic: Referral - Request for Referral >> Jun 04, 2024  2:44 PM Rea C wrote: Did the patient discuss referral with their provider in the last year? Yes (If No - schedule appointment) (If Yes - send message)  Appointment offered? Not yet   Type of order/referral and detailed reason for visit: COPD- Patient is seeking pulmonologist   Preference of office, provider, location: [facility], [phone] ATTN: Sari   If referral order, have you been seen by this specialty before? No (If Yes, this issue or another issue? When? Where?  Can we respond through MyChart? Yes

## 2024-06-15 ENCOUNTER — Other Ambulatory Visit: Payer: Self-pay | Admitting: Family Medicine

## 2024-06-15 DIAGNOSIS — E119 Type 2 diabetes mellitus without complications: Secondary | ICD-10-CM

## 2024-06-15 MED ORDER — TRUEPLUS LANCETS 33G MISC: 1 refills | Status: DC

## 2024-06-15 MED ORDER — TRUE METRIX BLOOD GLUCOSE TEST VI STRP: ORAL_STRIP | 1 refills | Status: DC

## 2024-06-15 NOTE — Telephone Encounter (Signed)
 Copied from CRM #[id number]. Topic: Clinical - Medication Refill >> Jun 15, 2024  8:51 AM Michelle Long wrote: Medication: TRUE METRIX BLOOD GLUCOSE TEST test strip TRUEplus Lancets 33G MISC  Has the patient contacted their pharmacy? Yes (Agent: If no, request that the patient contact the pharmacy for the refill. If patient does not wish to contact the pharmacy document the reason why and proceed with request.) (Agent: If yes, when and what did the pharmacy advise?)  This is the patient's preferred pharmacy:   [facility] - [place], [place] - [address] [place] [address] Phone: [phone] Fax: [phone]    Is this the correct pharmacy for this prescription? Yes If no, delete pharmacy and type the correct one.   Has the prescription been filled recently? No  Is the patient out of the medication? No  Has the patient been seen for an appointment in the last year OR does the patient have an upcoming appointment? Yes  Can we respond through MyChart? Yes  Agent: Please be advised that Rx refills may take up to 3 business days. We ask that you follow-up with your pharmacy.

## 2024-06-15 NOTE — Telephone Encounter (Signed)
 Copied from CRM #[id number]. Topic: Clinical - Medication Refill >> Jun 15, 2024  8:51 AM Ismael A wrote: Medication: TRUE METRIX BLOOD GLUCOSE TEST test strip TRUEplus Lancets 33G MISC  Has the patient contacted their pharmacy? Yes (Agent: If no, request that the patient contact the pharmacy for the refill. If patient does not wish to contact the pharmacy document the reason why and proceed with request.) (Agent: If yes, when and what did the pharmacy advise?)  This is the patient's preferred pharmacy:   [facility] [place], [place] - [address] [place] [address] Phone: [phone] Fax: [phone]    Is this the correct pharmacy for this prescription? Yes If no, delete pharmacy and type the correct one.   Has the prescription been filled recently? No  Is the patient out of the medication? No  Has the patient been seen for an appointment in the last year OR does the patient have an upcoming appointment? Yes  Can we respond through MyChart? Yes  Agent: Please be advised that Rx refills may take up to 3 business days. We ask that you follow-up with your pharmacy.

## 2024-06-24 ENCOUNTER — Inpatient Hospital Stay: Admitting: Medical Oncology

## 2024-06-24 ENCOUNTER — Inpatient Hospital Stay: Admitting: Hematology & Oncology

## 2024-06-29 ENCOUNTER — Inpatient Hospital Stay: Attending: Medical Oncology | Admitting: Medical Oncology

## 2024-06-29 VITALS — BP 129/83 | HR 105 | Temp 97.9°F | Resp 18 | Ht 63.0 in | Wt 142.5 lb

## 2024-06-29 DIAGNOSIS — Z853 Personal history of malignant neoplasm of breast: Secondary | ICD-10-CM | POA: Diagnosis not present

## 2024-06-29 DIAGNOSIS — N6099 Unspecified benign mammary dysplasia of unspecified breast: Secondary | ICD-10-CM | POA: Diagnosis not present

## 2024-06-29 DIAGNOSIS — Z170 Estrogen receptor positive status [ER+]: Secondary | ICD-10-CM

## 2024-06-29 DIAGNOSIS — Z08 Encounter for follow-up examination after completed treatment for malignant neoplasm: Secondary | ICD-10-CM | POA: Insufficient documentation

## 2024-06-29 DIAGNOSIS — Z87891 Personal history of nicotine dependence: Secondary | ICD-10-CM | POA: Insufficient documentation

## 2024-06-29 DIAGNOSIS — C50312 Malignant neoplasm of lower-inner quadrant of left female breast: Secondary | ICD-10-CM | POA: Diagnosis not present

## 2024-06-29 NOTE — Progress Notes (Signed)
 Hematology and Oncology Follow Up Visit  Michelle Long [id number] [birth year] 78 y.o. 06/29/2024   Principle Diagnosis:  Stage IIA (T2 N0M0) infiltrating ductal carcinoma of the left breast- 2008 Usual Ductal Hyperplasia of Right breast-- Complex sclerosing - 02/2021  - Lumpectomy- Dr. Curvin- 02/2021  Current Therapy:   Tamoxifen  20 mg by mouth daily - finished in September 2017 after completing 8 years of therapy Femara  2.5 mg p.o. daily --started on 04/02/2021 following ductal hyperplasia finding  Interim History:  Michelle Long is here for follow-up.   At her last visit she discussed SOB, chest pains, generalized aches/pains, brain fog that she was having. She was having to use her rescue inhaler often. She had evaluation including blood work, EKG, chest x ray without significant findings. She suspected her symptoms were due to her Femara . She ended up stopping the Femara  and found that once she stopped this medication these symptoms resolved.  Today she states that she has been doing well. She has no concerns or complaints.   Last mammogram was on 08/21/2023 and was BI-RADs-3 PCP has placed orders for a repeat DEXA scan to be performed.   She is a former smoker  She has had no bleeding.  There is been no leg swelling.  She has had no rashes. No chest pains, SOB, night sweats, unintentional weight loss- she has been focused on controlling her diabetes and following a low carb diet to help with weight loss efforts and DM2.   Soft bump in inguinal fold is stable- GYN agrees to watch  Overall, her performance status is probably ECOG 1.   Wt Readings from Last 3 Encounters:  06/29/24 142 lb 8 oz (64.6 kg)  03/30/24 147 lb 12.8 oz (67 kg)  01/30/24 147 lb 3.2 oz (66.8 kg)    Medications:  Allergies as of 06/29/2024       Reactions   Fluoxetine Other (See Comments)   seizure   Fluoxetine Hcl Other (See Comments)   seizure   Lamotrigine Itching, Other (See Comments)   itch   Sulfa  Antibiotics Shortness Of Breath   Sulfa Antibiotics    Sulfa Drugs Cross Reactors Shortness Of Breath      Statins Other (See Comments)   Muscle pain Muscle pains, Muscle pains        Medication List        Accurate as of June 29, 2024  2:29 PM. If you have any questions, ask your nurse or doctor.          albuterol  108 (90 Base) MCG/ACT inhaler Commonly known as: VENTOLIN  HFA INHALE 2 PUFFS EVERY 6 HOURS AS NEEDED FOR WHEEZING OR SHORTNESS OF BREATH   alendronate  70 MG tablet Commonly known as: FOSAMAX  TAKE 1 TABLET BY MOUTH ONCE A WEEK WITH  A  FULL  GLASS  OF  WATER  ON  AN  EMPTY  STOMACH   aspirin 81 MG tablet Take 81 mg by mouth daily.   budesonide -formoterol  160-4.5 MCG/ACT inhaler Commonly known as: Breyna  Inhale 2 puffs into the lungs 2 (two) times daily.   CALCIUM  1000 + D PO Take by mouth 2 (two) times daily.   citalopram  10 MG tablet Commonly known as: CELEXA  Take 0.5 tablets (5 mg total) by mouth at bedtime.   DropSafe Alcohol  Prep 70 % Pads To use before checking blood sugars   ezetimibe  10 MG tablet Commonly known as: ZETIA  TAKE 1 TABLET BY MOUTH DAILY   LORazepam  0.5 MG tablet  Commonly known as: ATIVAN  Take 0.5 mg by mouth daily as needed for anxiety.   losartan  50 MG tablet Commonly known as: COZAAR  Take 1 tablet (50 mg total) by mouth daily.   metFORMIN  1000 MG tablet Commonly known as: GLUCOPHAGE  TAKE 1 TABLET BY MOUTH TWICE  DAILY WITH MEALS   MULTIVITAMIN ADULT PO Take 1 capsule by mouth daily at 6 (six) AM.   nateglinide  60 MG tablet Commonly known as: STARLIX  Take 1 tablet (60 mg total) by mouth 2 (two) times daily with a meal.   OLANZapine  5 MG tablet Commonly known as: ZYPREXA  1/2 tablet daily What changed:  how much to take how to take this when to take this additional instructions   pantoprazole  40 MG tablet Commonly known as: PROTONIX  Take 1 tablet (40 mg total) by mouth daily.   rosuvastatin  5 MG  tablet Commonly known as: CRESTOR  TAKE 1 TABLET BY MOUTH 3 TIMES  WEEKLY   Spacer/Aero Chamber Mouthpiece Misc To use with inhaler   True Metrix Air Glucose Meter w/Device Kit Check blood sugars once daily   True Metrix Blood Glucose Test test strip Generic drug: glucose blood TEST BLOOD SUGAR EVERY DAY   True Metrix Level 1 Low Soln USE AS DIRECTED WITH GLUCOSE METER   TRUEplus Lancets 33G Misc TEST BLOOD SUGAR EVERY DAY   Vitamin D  50 MCG (2000 UT) Caps Take 2,000 Units by mouth daily.        Allergies:  Allergies  Allergen Reactions   Fluoxetine Other (See Comments)    seizure   Fluoxetine Hcl Other (See Comments)    seizure   Lamotrigine Itching and Other (See Comments)    itch   Sulfa Antibiotics Shortness Of Breath   Sulfa Antibiotics    Sulfa Drugs Cross Reactors Shortness Of Breath        Statins Other (See Comments)    Muscle pain  Muscle pains, Muscle pains    Past Medical History, Surgical history, Social history, and Family History were reviewed and updated.  Review of Systems: Review of Systems  Constitutional: Negative.   HENT: Negative.    Eyes: Negative.   Respiratory: Negative.    Cardiovascular: Negative.   Gastrointestinal: Negative.   Genitourinary: Negative.   Musculoskeletal: Negative.   Skin: Negative.   Neurological: Negative.   Endo/Heme/Allergies: Negative.   Psychiatric/Behavioral: Negative.       Physical Exam:  height is 5' 3 (1.6 m) and weight is 142 lb 8 oz (64.6 kg). Her oral temperature is 97.9 F (36.6 C). Her blood pressure is 129/83 and her pulse is 105 (abnormal). Her respiration is 18 and oxygen saturation is 97%.   Wt Readings from Last 3 Encounters:  06/29/24 142 lb 8 oz (64.6 kg)  03/30/24 147 lb 12.8 oz (67 kg)  01/30/24 147 lb 3.2 oz (66.8 kg)     Lab Results  Component Value Date   WBC 6.3 03/30/2024   HGB 13.2 03/30/2024   HCT 41.5 03/30/2024   MCV 87.7 03/30/2024   PLT 221.0 03/30/2024    No results found for: FERRITIN, IRON, TIBC, UIBC, IRONPCTSAT Lab Results  Component Value Date   RBC 4.73 03/30/2024   No results found for: KPAFRELGTCHN, LAMBDASER, KAPLAMBRATIO No results found for: IGGSERUM, IGA, IGMSERUM No results found for: TOTALPROTELP, ALBUMINELP, A1GS, A2GS, BETS, BETA2SER, GAMS, MSPIKE, SPEI   Chemistry      Component Value Date/Time   NA 140 03/30/2024 1154   NA 139 07/02/2017 1445  NA 142 01/01/2017 1059   K 4.8 03/30/2024 1154   K 4.6 07/02/2017 1445   K 4.6 01/01/2017 1059   CL 104 03/30/2024 1154   CL 103 07/02/2017 1445   CL 105 01/20/2015 1240   CO2 28 03/30/2024 1154   CO2 28 07/02/2017 1445   CO2 26 01/01/2017 1059   BUN 12 03/30/2024 1154   BUN 10 07/02/2017 1445   BUN 10.6 01/01/2017 1059   CREATININE 0.78 03/30/2024 1154   CREATININE 0.89 12/23/2023 1131   CREATININE 0.68 07/17/2020 1105   CREATININE 0.8 01/01/2017 1059      Component Value Date/Time   CALCIUM  10.0 03/30/2024 1154   CALCIUM  10.3 07/02/2017 1445   CALCIUM  10.1 01/01/2017 1059   ALKPHOS 35 (L) 03/30/2024 1154   ALKPHOS 47 07/02/2017 1445   ALKPHOS 49 01/01/2017 1059   AST 31 03/30/2024 1154   AST 39 12/23/2023 1131   AST 60 (H) 01/01/2017 1059   ALT 34 03/30/2024 1154   ALT 40 12/23/2023 1131   ALT 63 (H) 01/01/2017 1059   BILITOT 0.5 03/30/2024 1154   BILITOT 0.5 12/23/2023 1131   BILITOT 0.64 01/01/2017 1059      Encounter Diagnoses  Name Primary?   Malignant neoplasm of lower-inner quadrant of left breast in female, estrogen receptor positive (HCC) Yes   Ductal hyperplasia of breast     Impression and Plan: Michelle Long is a delightful 78 year old postmenopausal white female. She had stage IIA ductal carcinoma the left breast in 2008. She completed tamoxifen  in September 2017. She then had a ductal hyperplasia removed by Dr. Curvin in 2022. She was placed on Femara  but has had significant side effects and has since  stopped this medication.   Labs from 03/2024 show a normal CBC.  She is UTD on mammograms- slightly overdue on DEXA scan. She will continue follow up with her PCP.  At her last visit her calcium  was 0.1 above target- she reduced her calcium  supplement and this has returned to normal level in June labs.   RTC 6 months APP, no labs needed    Michelle Long, [place] 9/9/20252:29 PM

## 2024-07-06 DIAGNOSIS — J449 Chronic obstructive pulmonary disease, unspecified: Secondary | ICD-10-CM | POA: Diagnosis not present

## 2024-07-13 DIAGNOSIS — J449 Chronic obstructive pulmonary disease, unspecified: Secondary | ICD-10-CM | POA: Diagnosis not present

## 2024-07-15 ENCOUNTER — Ambulatory Visit (INDEPENDENT_AMBULATORY_CARE_PROVIDER_SITE_OTHER): Admitting: Family Medicine

## 2024-07-15 ENCOUNTER — Encounter: Payer: Self-pay | Admitting: Family Medicine

## 2024-07-15 VITALS — BP 134/60 | HR 88 | Temp 98.0°F | Resp 18 | Ht 63.0 in | Wt 142.4 lb

## 2024-07-15 DIAGNOSIS — E785 Hyperlipidemia, unspecified: Secondary | ICD-10-CM

## 2024-07-15 DIAGNOSIS — E1169 Type 2 diabetes mellitus with other specified complication: Secondary | ICD-10-CM

## 2024-07-15 DIAGNOSIS — E1165 Type 2 diabetes mellitus with hyperglycemia: Secondary | ICD-10-CM

## 2024-07-15 DIAGNOSIS — I10 Essential (primary) hypertension: Secondary | ICD-10-CM | POA: Diagnosis not present

## 2024-07-15 DIAGNOSIS — Z23 Encounter for immunization: Secondary | ICD-10-CM | POA: Diagnosis not present

## 2024-07-15 DIAGNOSIS — E119 Type 2 diabetes mellitus without complications: Secondary | ICD-10-CM

## 2024-07-15 NOTE — Assessment & Plan Note (Signed)
 Well controlled, no changes to meds. Encouraged heart healthy diet such as the DASH diet and exercise as tolerated.

## 2024-07-15 NOTE — Progress Notes (Signed)
 Subjective:    Patient ID: Michelle Long, female    DOB: 1946/06/12, 78 y.o.   MRN: [id number]  Chief Complaint  Patient presents with   Medication Management    Pt would like to discuss pantoprazole  and would like to stop medication     HPI Patient is in today for f/u pantoprazole  and anxiety.  Discussed the use of AI scribe software for clinical note transcription with the patient, who gave verbal consent to proceed.  History of Present Illness Michelle Long is a 78 year old female with COPD who presents for a follow-up on her respiratory condition and medication management.  She has a history of COPD diagnosed years ago and attends a breathing clinic, though she has not received feedback yet. She is currently on Breztri but does not recall the exact dosage. She no longer requires albuterol  and does not think about it anymore.  She has a history of indigestion and heartburn, which she used to experience frequently. She has been on pantoprazole  and has not had symptoms for several years.  She monitors her blood glucose levels daily, with readings ranging from 85 to 116 mg/dL, mostly around 95 mg/dL. Her last A1c in June was 6.8%. Her daughter also checks her blood sugar, and she does it together daily.    Past Medical History:  Diagnosis Date   Anxiety    Asthma    Bipolar disorder [facility])    Bladder cancer (HCC)    Breast cancer (HCC)    Cancer of lower-inner quadrant of left female breast (HCC) 12/08/2013   COPD (chronic obstructive pulmonary disease) (HCC)    former smoker, quit 1999   Depression    Diabetes mellitus    GERD (gastroesophageal reflux disease)    Hepatitis B infection    History of transfusion of whole blood    Approximately 2009, due to breast cancer/chemo.   Hyperlipidemia    Hypertension    Personal history of chemotherapy    Personal history of radiation therapy    PONV (postoperative nausea and vomiting)     Past Surgical History:  Procedure  Laterality Date   ABDOMINAL HYSTERECTOMY     APPENDECTOMY  1961   bladder cancer  2006   BREAST LUMPECTOMY Left 2007   BREAST LUMPECTOMY WITH RADIOACTIVE SEED LOCALIZATION Right 03/09/2021   Procedure: RIGHT BREAST LUMPECTOMY WITH RADIOACTIVE SEED LOCALIZATION;  Surgeon: Curvin Deward MOULD, MD;  Location: [facility];  Service: General;  Laterality: Right;   PARTIAL HYSTERECTOMY  1991   TONSILLECTOMY  1951    Family History  Problem Relation Age of Onset   Arthritis Mother    Breast cancer Mother    Transient ischemic attack Mother    Hypertension Mother    Heart disease Father    Diabetes Father    Cancer Father        bladder cancer   Throat cancer Father    Breast cancer Sister     Social History   Socioeconomic History   Marital status: Divorced    Spouse name: Not on file   Number of children: Not on file   Years of education: Not on file   Highest education level: Not on file  Occupational History   Occupation: retired    Comment: retired  Tobacco Use   Smoking status: Former    Current packs/day: 0.00    Average packs/day: 1.5 packs/day for 33.7 years (50.6 ttl pk-yrs)    Types: Cigarettes  Start date: 12/08/1964    Quit date: 08/21/1998    Years since quitting: 25.9   Smokeless tobacco: Never   Tobacco comments:    quit 16 years ago  Vaping Use   Vaping status: Never Used  Substance and Sexual Activity   Alcohol  use: Not Currently    Alcohol /week: 0.0 standard drinks of alcohol    Drug use: No   Sexual activity: Not Currently    Partners: Male    Birth control/protection: Surgical  Other Topics Concern   Not on file  Social History Narrative   Exercise--- no   Social Drivers of Health   Financial Resource Strain: Low Risk  (10/09/2023)   Overall Financial Resource Strain (CARDIA)    Difficulty of Paying Living Expenses: Not hard at all  Food Insecurity: Low Risk  (07/06/2024)   Received from [facility]   Hunger Vital Sign     Within the past 12 months, you worried that your food would run out before you got money to buy more: Never true    Within the past 12 months, the food you bought just didn't last and you didn't have money to get more. : Never true  Transportation Needs: No Transportation Needs (07/06/2024)   Received from [facility]    In the past 12 months, has lack of reliable transportation kept you from medical appointments, meetings, work or from getting things needed for daily living? : No  Physical Activity: Inactive (10/09/2023)   Exercise Vital Sign    Days of Exercise per Week: 0 days    Minutes of Exercise per Session: 0 min  Stress: No Stress Concern Present (10/09/2023)   [employer] of Occupational Health - Occupational Stress Questionnaire    Feeling of Stress : Not at all  Social Connections: Moderately Integrated (10/09/2023)   Social Connection and Isolation Panel    Frequency of Communication with Friends and Family: More than three times a week    Frequency of Social Gatherings with Friends and Family: More than three times a week    Attends Religious Services: More than 4 times per year    Active Member of [employer] or Organizations: Yes    Attends Banker Meetings: More than 4 times per year    Marital Status: Divorced  Intimate Partner Violence: Not At Risk (10/09/2023)   Humiliation, Afraid, Rape, and Kick questionnaire    Fear of Current or Ex-Partner: No    Emotionally Abused: No    Physically Abused: No    Sexually Abused: No    Outpatient Medications Prior to Visit  Medication Sig Dispense Refill   albuterol  (VENTOLIN  HFA) 108 (90 Base) MCG/ACT inhaler INHALE 2 PUFFS EVERY 6 HOURS AS NEEDED FOR WHEEZING OR SHORTNESS OF BREATH 1 each 1   Alcohol  Swabs (DROPSAFE ALCOHOL  PREP) 70 % PADS To use before checking blood sugars 200 each 12   alendronate  (FOSAMAX ) 70 MG tablet TAKE 1 TABLET BY MOUTH ONCE A WEEK WITH  A  FULL  GLASS  OF  WATER  ON   AN  EMPTY  STOMACH 12 tablet 2   aspirin 81 MG tablet Take 81 mg by mouth daily.     Blood Glucose Calibration (TRUE METRIX LEVEL 1) Low SOLN USE AS DIRECTED WITH GLUCOSE METER 1 each PRN   Blood Glucose Monitoring Suppl (TRUE METRIX AIR GLUCOSE METER) w/Device KIT Check blood sugars once daily 1 kit 0   BREZTRI AEROSPHERE 160-9-4.8 MCG/ACT AERO inhaler Inhale  2 puffs into the lungs.     Calcium  Carb-Cholecalciferol (CALCIUM  1000 + D PO) Take by mouth 2 (two) times daily.     Cholecalciferol (VITAMIN D ) 2000 units CAPS Take 2,000 Units by mouth daily.     citalopram  (CELEXA ) 10 MG tablet Take 0.5 tablets (5 mg total) by mouth at bedtime. 45 tablet 1   ezetimibe  (ZETIA ) 10 MG tablet TAKE 1 TABLET BY MOUTH DAILY 90 tablet 1   glucose blood (TRUE METRIX BLOOD GLUCOSE TEST) test strip TEST BLOOD SUGAR EVERY DAY 100 strip 1   LORazepam  (ATIVAN ) 0.5 MG tablet Take 0.5 mg by mouth daily as needed for anxiety.     losartan  (COZAAR ) 50 MG tablet Take 1 tablet (50 mg total) by mouth daily. 90 tablet 1   metFORMIN  (GLUCOPHAGE ) 1000 MG tablet TAKE 1 TABLET BY MOUTH TWICE  DAILY WITH MEALS 200 tablet 0   Multiple Vitamin (MULTIVITAMIN ADULT PO) Take 1 capsule by mouth daily at 6 (six) AM.     nateglinide  (STARLIX ) 60 MG tablet Take 1 tablet (60 mg total) by mouth 2 (two) times daily with a meal. 180 tablet 1   OLANZapine  (ZYPREXA ) 5 MG tablet 1/2 tablet daily (Patient taking differently: Take 5 mg by mouth daily. 1 tablet daily) 45 tablet 1   pantoprazole  (PROTONIX ) 40 MG tablet Take 1 tablet (40 mg total) by mouth daily. 90 tablet 3   rosuvastatin  (CRESTOR ) 5 MG tablet TAKE 1 TABLET BY MOUTH 3 TIMES  WEEKLY 36 tablet 3   Spacer/Aero Chamber Mouthpiece MISC To use with inhaler 1 each 1   TRUEplus Lancets 33G MISC TEST BLOOD SUGAR EVERY DAY 100 each 1   budesonide -formoterol  (BREYNA ) 160-4.5 MCG/ACT inhaler Inhale 2 puffs into the lungs 2 (two) times daily. 3 each 2   No facility-administered medications  prior to visit.    Allergies  Allergen Reactions   Fluoxetine Other (See Comments)    seizure   Fluoxetine Hcl Other (See Comments)    seizure   Lamotrigine Itching and Other (See Comments)    itch   Sulfa Antibiotics Shortness Of Breath   Sulfa Antibiotics    Sulfa Drugs Cross Reactors Shortness Of Breath        Statins Other (See Comments)    Muscle pain  Muscle pains, Muscle pains    Review of Systems  Constitutional:  Negative for fever.  HENT:  Negative for congestion.   Eyes:  Negative for blurred vision.  Respiratory:  Negative for cough.   Cardiovascular:  Negative for chest pain and palpitations.  Gastrointestinal:  Negative for vomiting.  Musculoskeletal:  Negative for back pain.  Skin:  Negative for rash.  Neurological:  Negative for loss of consciousness and headaches.       Objective:    Physical Exam Vitals and nursing note reviewed.  Constitutional:      General: She is not in acute distress.    Appearance: Normal appearance. She is well-developed.  HENT:     Head: Normocephalic and atraumatic.  Eyes:     General: No scleral icterus.       Right eye: No discharge.        Left eye: No discharge.  Cardiovascular:     Rate and Rhythm: Normal rate and regular rhythm.     Heart sounds: No murmur heard. Pulmonary:     Effort: Pulmonary effort is normal. No respiratory distress.     Breath sounds: Normal breath sounds.  Musculoskeletal:  General: Normal range of motion.     Cervical back: Normal range of motion and neck supple.     Right lower leg: No edema.     Left lower leg: No edema.  Skin:    General: Skin is warm and dry.  Neurological:     Mental Status: She is alert and oriented to person, place, and time.  Psychiatric:        Mood and Affect: Mood is anxious.        Behavior: Behavior normal.        Thought Content: Thought content normal.        Judgment: Judgment normal.     BP 134/60 (BP Location: Right Arm, Patient  Position: Sitting, Cuff Size: Normal)   Pulse 88   Temp 98 F (36.7 C) (Oral)   Resp 18   Ht 5' 3 (1.6 m)   Wt 142 lb 6.4 oz (64.6 kg)   SpO2 96%   BMI 25.23 kg/m  Wt Readings from Last 3 Encounters:  07/15/24 142 lb 6.4 oz (64.6 kg)  06/29/24 142 lb 8 oz (64.6 kg)  03/30/24 147 lb 12.8 oz (67 kg)    Diabetic Foot Exam - Simple   No data filed    Lab Results  Component Value Date   WBC 6.3 03/30/2024   HGB 13.2 03/30/2024   HCT 41.5 03/30/2024   PLT 221.0 03/30/2024   GLUCOSE 122 (H) 03/30/2024   CHOL 179 03/30/2024   TRIG 129.0 03/30/2024   HDL 50.90 03/30/2024   LDLDIRECT 145.6 06/22/2013   LDLCALC 102 (H) 03/30/2024   ALT 34 03/30/2024   AST 31 03/30/2024   NA 140 03/30/2024   K 4.8 03/30/2024   CL 104 03/30/2024   CREATININE 0.78 03/30/2024   BUN 12 03/30/2024   CO2 28 03/30/2024   TSH 0.80 01/30/2015   INR 0.97 09/25/2011   HGBA1C 6.8 (H) 03/30/2024   MICROALBUR 4.9 (H) 03/30/2024    Lab Results  Component Value Date   TSH 0.80 01/30/2015   Lab Results  Component Value Date   WBC 6.3 03/30/2024   HGB 13.2 03/30/2024   HCT 41.5 03/30/2024   MCV 87.7 03/30/2024   PLT 221.0 03/30/2024   Lab Results  Component Value Date   NA 140 03/30/2024   K 4.8 03/30/2024   CHLORIDE 103 01/01/2017   CO2 28 03/30/2024   GLUCOSE 122 (H) 03/30/2024   BUN 12 03/30/2024   CREATININE 0.78 03/30/2024   BILITOT 0.5 03/30/2024   ALKPHOS 35 (L) 03/30/2024   AST 31 03/30/2024   ALT 34 03/30/2024   PROT 7.1 03/30/2024   ALBUMIN 4.4 03/30/2024   CALCIUM  10.0 03/30/2024   ANIONGAP 12 12/23/2023   EGFR 70 (L) 01/01/2017   GFR 72.97 03/30/2024   Lab Results  Component Value Date   CHOL 179 03/30/2024   Lab Results  Component Value Date   HDL 50.90 03/30/2024   Lab Results  Component Value Date   LDLCALC 102 (H) 03/30/2024   Lab Results  Component Value Date   TRIG 129.0 03/30/2024   Lab Results  Component Value Date   CHOLHDL 4 03/30/2024   Lab  Results  Component Value Date   HGBA1C 6.8 (H) 03/30/2024       Assessment & Plan:  Diabetes mellitus type II, non insulin dependent (HCC)  Essential hypertension Assessment & Plan: Well controlled, no changes to meds. Encouraged heart healthy diet such as the DASH diet  and exercise as tolerated.     Type 2 diabetes mellitus with hyperglycemia, without long-term current use of insulin (HCC) -     Comprehensive metabolic panel with GFR -     Hemoglobin A1c  Hyperlipidemia associated with type 2 diabetes mellitus (HCC) Assessment & Plan: Encourage heart healthy diet such as MIND or DASH diet, increase exercise, avoid trans fats, simple carbohydrates and processed foods, consider a krill or fish or flaxseed oil cap daily.    Orders: -     Comprehensive metabolic panel with GFR -     Lipid panel  Need for influenza vaccination -     Flu vaccine HIGH DOSE PF(Fluzone Trivalent)  Assessment and Plan Assessment & Plan Chronic obstructive pulmonary disease (COPD)   COPD is previously diagnosed and managed with Breztri. She reports significant improvement and no longer requires albuterol . A referral to a specialist confirmed asthma leaning towards COPD.  Type 2 diabetes mellitus   Type 2 diabetes mellitus is present with a recent A1c of 6.8 in June. She monitors blood glucose daily, with readings mostly around 95, ranging from 85 to 116. She is eager to see current A1c results. Order A1c test today.  Gastroesophageal reflux disease (GERD)   GERD was previously managed with pantoprazole . She reports no symptoms of indigestion or heartburn for several years and desires to discontinue pantoprazole . Discontinue pantoprazole  and monitor for recurrence of symptoms. Retain pantoprazole  on the medication list for potential future need.    Brylynn Hanssen R Lowne Chase, DO

## 2024-07-15 NOTE — Assessment & Plan Note (Signed)
 Encourage heart healthy diet such as MIND or DASH diet, increase exercise, avoid trans fats, simple carbohydrates and processed foods, consider a krill or fish or flaxseed oil cap daily.

## 2024-07-16 LAB — COMPREHENSIVE METABOLIC PANEL WITH GFR
ALT: 30 U/L (ref 0–35)
AST: 32 U/L (ref 0–37)
Albumin: 4.3 g/dL (ref 3.5–5.2)
Alkaline Phosphatase: 34 U/L — ABNORMAL LOW (ref 39–117)
BUN: 9 mg/dL (ref 6–23)
CO2: 28 meq/L (ref 19–32)
Calcium: 9.8 mg/dL (ref 8.4–10.5)
Chloride: 102 meq/L (ref 96–112)
Creatinine, Ser: 0.81 mg/dL (ref 0.40–1.20)
GFR: 69.6 mL/min (ref >60.00)
Glucose, Bld: 95 mg/dL (ref 70–99)
Potassium: 4.4 meq/L (ref 3.5–5.1)
Sodium: 140 meq/L (ref 135–145)
Total Bilirubin: 0.4 mg/dL (ref 0.2–1.2)
Total Protein: 6.6 g/dL (ref 6.0–8.3)

## 2024-07-16 LAB — LIPID PANEL
Cholesterol: 145 mg/dL (ref 0–200)
HDL: 43.3 mg/dL (ref >39.00)
LDL Cholesterol: 83 mg/dL (ref 0–99)
NonHDL: 101.73
Total CHOL/HDL Ratio: 3
Triglycerides: 95 mg/dL (ref 0.0–149.0)
VLDL: 19 mg/dL (ref 0.0–40.0)

## 2024-07-16 LAB — HEMOGLOBIN A1C: Hgb A1c MFr Bld: 6.7 % — ABNORMAL HIGH (ref 4.6–6.5)

## 2024-07-19 ENCOUNTER — Other Ambulatory Visit: Payer: Self-pay | Admitting: Family Medicine

## 2024-07-19 DIAGNOSIS — R921 Mammographic calcification found on diagnostic imaging of breast: Secondary | ICD-10-CM

## 2024-07-22 ENCOUNTER — Other Ambulatory Visit: Payer: Self-pay | Admitting: Family Medicine

## 2024-07-22 DIAGNOSIS — I10 Essential (primary) hypertension: Secondary | ICD-10-CM

## 2024-08-10 DIAGNOSIS — Z1389 Encounter for screening for other disorder: Secondary | ICD-10-CM | POA: Diagnosis not present

## 2024-08-11 ENCOUNTER — Other Ambulatory Visit: Payer: Self-pay | Admitting: Family Medicine

## 2024-08-17 ENCOUNTER — Other Ambulatory Visit: Payer: Self-pay | Admitting: Medical Genetics

## 2024-08-21 ENCOUNTER — Other Ambulatory Visit: Payer: Self-pay | Admitting: Family Medicine

## 2024-08-23 ENCOUNTER — Ambulatory Visit
Admission: RE | Admit: 2024-08-23 | Discharge: 2024-08-23 | Disposition: A | Source: Ambulatory Visit | Attending: Family Medicine | Admitting: Family Medicine

## 2024-08-23 DIAGNOSIS — R921 Mammographic calcification found on diagnostic imaging of breast: Secondary | ICD-10-CM

## 2024-08-28 ENCOUNTER — Other Ambulatory Visit: Payer: Self-pay | Admitting: Family Medicine

## 2024-09-02 ENCOUNTER — Ambulatory Visit: Payer: Self-pay

## 2024-09-02 NOTE — Telephone Encounter (Signed)
 FYI Only or Action Required?: FYI only for provider: appointment scheduled on 11/14.  Patient was last seen in primary care on 07/15/2024 by Antonio Meth, Jamee SAUNDERS, DO.  Called Nurse Triage reporting Dysuria.  Symptoms began several days ago.  Interventions attempted: Nothing.  Symptoms are: unchanged.  Triage Disposition: See Physician Within 24 Hours  Patient/caregiver understands and will follow disposition?: Yes     Copied from CRM #[id number]. Topic: Clinical - Red Word Triage >> Sep 02, 2024  3:06 PM Darshell M wrote: Red Word that prompted transfer to Nurse Triage: Started with back ache Sunday but was gone by Monday. Urge incontinence and not fully voiding. Burning with urination       Reason for Disposition  Age > 50 years  Answer Assessment - Initial Assessment Questions 1. SEVERITY: How bad is the pain?  (e.g., Scale 1-10; mild, moderate, or severe)     Mild pain at the end of urination  2. FREQUENCY: How many times have you had painful urination today?      Several times  3. PATTERN: Is pain present every time you urinate or just sometimes?      Every urination  4. ONSET: When did the painful urination start?      4 days ago  5. FEVER: Do you have a fever? If Yes, ask: What is your temperature, how was it measured, and when did it start?     No 6. PAST UTI: Have you had a urine infection before? If Yes, ask: When was the last time? and What happened that time?      Yes, but this feels different  7. CAUSE: What do you think is causing the painful urination?  (e.g., UTI, scratch, Herpes sore)     Unsure if UTI 8. OTHER SYMPTOMS: Do you have any other symptoms? (e.g., blood in urine, flank pain, genital sores, urgency, vaginal discharge)     Cloudy urine, increased urgency, decreased urine output  Protocols used: Urination Pain - Female-A-AH

## 2024-09-03 ENCOUNTER — Other Ambulatory Visit: Payer: Self-pay

## 2024-09-03 ENCOUNTER — Other Ambulatory Visit (HOSPITAL_BASED_OUTPATIENT_CLINIC_OR_DEPARTMENT_OTHER): Payer: Self-pay

## 2024-09-03 ENCOUNTER — Encounter: Payer: Self-pay | Admitting: Student

## 2024-09-03 ENCOUNTER — Ambulatory Visit (INDEPENDENT_AMBULATORY_CARE_PROVIDER_SITE_OTHER): Admitting: Student

## 2024-09-03 VITALS — BP 142/72 | HR 82 | Temp 97.6°F | Resp 16 | Ht 63.0 in | Wt 146.4 lb

## 2024-09-03 DIAGNOSIS — R300 Dysuria: Secondary | ICD-10-CM

## 2024-09-03 DIAGNOSIS — R350 Frequency of micturition: Secondary | ICD-10-CM | POA: Diagnosis not present

## 2024-09-03 LAB — POCT URINALYSIS DIPSTICK
Bilirubin, UA: NEGATIVE
Blood, UA: NEGATIVE
Glucose, UA: NEGATIVE
Ketones, UA: NEGATIVE
Nitrite, UA: NEGATIVE
Protein, UA: NEGATIVE
Spec Grav, UA: <=1.005 — AB (ref 1.010–1.025)
Urobilinogen, UA: 0.2 U/dL
pH, UA: 7.5 (ref 5.0–8.0)

## 2024-09-03 MED ORDER — NITROFURANTOIN MONOHYD MACRO 100 MG PO CAPS
100.0000 mg | ORAL_CAPSULE | Freq: Two times a day (BID) | ORAL | 0 refills | Status: AC
  Filled 2024-09-03: qty 10, 5d supply, fill #0

## 2024-09-03 NOTE — Progress Notes (Signed)
 Chief Complaint  Patient presents with   Dysuria    Patient stated that her urine has been cloudy, some discomfort, urgency and lessened output since Monday    Michelle Long is a 78 y.o. female here for possible UTI.  History of Present Illness Michelle Long is a 78 year old female who presents with urinary tract symptoms.  She experiences urinary urgency with occasional slight incontinence before reaching the bathroom. Her urine appears cloudy and lighter in color. Urination results in variable output, sometimes small despite urgency, and other times larger. Mild dysuria at end of urination. No hematuria is present. No fever is present.Denies Hx UTI.  Patient denies fever, chills, SOB, CP, palpitations, dyspnea, edema, HA, vision changes, N/V/D, abdominal pain, rash, weight changes, and recent illness or hospitalizations.    Past Medical History:  Diagnosis Date   Anxiety    Asthma    Bipolar disorder [facility])    Bladder cancer (HCC)    Breast cancer (HCC)    Cancer of lower-inner quadrant of left female breast (HCC) 12/08/2013   COPD (chronic obstructive pulmonary disease) (HCC)    former smoker, quit 1999   Depression    Diabetes mellitus    GERD (gastroesophageal reflux disease)    Hepatitis B infection    History of transfusion of whole blood    Approximately 2009, due to breast cancer/chemo.   Hyperlipidemia    Hypertension    Personal history of chemotherapy    Personal history of radiation therapy    PONV (postoperative nausea and vomiting)      BP (!) 142/72 (BP Location: Right Arm, Patient Position: Sitting, Cuff Size: Normal)   Pulse 82   Temp 97.6 F (36.4 C) (Oral)   Resp 16   Ht 5' 3 (1.6 m)   Wt 146 lb 6.4 oz (66.4 kg)   SpO2 99%   BMI 25.93 kg/m  General: Awake, alert, appears stated age Heart: RRR Lungs: CTAB, normal respiratory effort, no accessory muscle usage Abd: BS+, soft, NT, ND, no masses or organomegaly MSK: No CVA tenderness,  Psych: Age  appropriate judgment and insight  Dysuria - Plan: POCT Urinalysis Dipstick, Urine Culture, nitrofurantoin , macrocrystal-monohydrate, (MACROBID ) 100 MG capsule  Urinary frequency - Plan: nitrofurantoin , macrocrystal-monohydrate, (MACROBID ) 100 MG capsule  -Urine Dip- + leuks - Sent urine for culture, results pending - Rx-Macrobid  Stay hydrated. Seek immediate care if pt starts to develop fevers, new/worsening symptoms, uncontrollable N/V. F/u prn, or if  symptoms persist or worsen The patient voiced understanding and agreement to the plan.  Harlene LITTIE Jolly, DNP, AGNP-C 09/03/24 2:27 PM

## 2024-09-05 LAB — URINE CULTURE
MICRO NUMBER:: 17236943
SPECIMEN QUALITY:: ADEQUATE

## 2024-09-06 ENCOUNTER — Ambulatory Visit: Payer: Self-pay | Admitting: Student

## 2024-09-28 ENCOUNTER — Encounter: Admitting: Family Medicine

## 2024-09-30 ENCOUNTER — Ambulatory Visit: Admitting: Family Medicine

## 2024-10-04 ENCOUNTER — Encounter: Payer: Self-pay | Admitting: Family Medicine

## 2024-10-04 ENCOUNTER — Ambulatory Visit: Admitting: Family Medicine

## 2024-10-04 VITALS — BP 122/70 | HR 99 | Temp 98.4°F | Resp 16 | Ht 63.0 in | Wt 147.4 lb

## 2024-10-04 DIAGNOSIS — E1169 Type 2 diabetes mellitus with other specified complication: Secondary | ICD-10-CM

## 2024-10-04 DIAGNOSIS — J4520 Mild intermittent asthma, uncomplicated: Secondary | ICD-10-CM

## 2024-10-04 DIAGNOSIS — I10 Essential (primary) hypertension: Secondary | ICD-10-CM | POA: Diagnosis not present

## 2024-10-04 DIAGNOSIS — E119 Type 2 diabetes mellitus without complications: Secondary | ICD-10-CM

## 2024-10-04 DIAGNOSIS — E785 Hyperlipidemia, unspecified: Secondary | ICD-10-CM

## 2024-10-04 DIAGNOSIS — Z0000 Encounter for general adult medical examination without abnormal findings: Secondary | ICD-10-CM | POA: Diagnosis not present

## 2024-10-04 LAB — LIPID PANEL
Cholesterol: 143 mg/dL (ref 0–200)
HDL: 49.4 mg/dL (ref >39.00)
LDL Cholesterol: 71 mg/dL (ref 0–99)
NonHDL: 93.22
Total CHOL/HDL Ratio: 3
Triglycerides: 109 mg/dL (ref 0.0–149.0)
VLDL: 21.8 mg/dL (ref 0.0–40.0)

## 2024-10-04 LAB — CBC WITH DIFFERENTIAL/PLATELET
Basophils Absolute: 0 K/uL (ref 0.0–0.1)
Basophils Relative: 0.8 % (ref 0.0–3.0)
Eosinophils Absolute: 0.2 K/uL (ref 0.0–0.7)
Eosinophils Relative: 2.6 % (ref 0.0–5.0)
HCT: 38.9 % (ref 36.0–46.0)
Hemoglobin: 13 g/dL (ref 12.0–15.0)
Lymphocytes Relative: 22.1 % (ref 12.0–46.0)
Lymphs Abs: 1.3 K/uL (ref 0.7–4.0)
MCHC: 33.4 g/dL (ref 30.0–36.0)
MCV: 89.6 fl (ref 78.0–100.0)
Monocytes Absolute: 0.6 K/uL (ref 0.1–1.0)
Monocytes Relative: 9.2 % (ref 3.0–12.0)
Neutro Abs: 3.9 K/uL (ref 1.4–7.7)
Neutrophils Relative %: 65.3 % (ref 43.0–77.0)
Platelets: 188 K/uL (ref 150.0–400.0)
RBC: 4.34 Mil/uL (ref 3.87–5.11)
RDW: 14.6 % (ref 11.5–15.5)
WBC: 6 K/uL (ref 4.0–10.5)

## 2024-10-04 LAB — COMPREHENSIVE METABOLIC PANEL WITH GFR
ALT: 25 U/L (ref 0–35)
AST: 28 U/L (ref 0–37)
Albumin: 4.2 g/dL (ref 3.5–5.2)
Alkaline Phosphatase: 35 U/L — ABNORMAL LOW (ref 39–117)
BUN: 12 mg/dL (ref 6–23)
CO2: 30 meq/L (ref 19–32)
Calcium: 9.8 mg/dL (ref 8.4–10.5)
Chloride: 104 meq/L (ref 96–112)
Creatinine, Ser: 0.72 mg/dL (ref 0.40–1.20)
GFR: 80.04 mL/min (ref >60.00)
Glucose, Bld: 90 mg/dL (ref 70–99)
Potassium: 4.8 meq/L (ref 3.5–5.1)
Sodium: 142 meq/L (ref 135–145)
Total Bilirubin: 0.4 mg/dL (ref 0.2–1.2)
Total Protein: 6.6 g/dL (ref 6.0–8.3)

## 2024-10-04 LAB — MICROALBUMIN / CREATININE URINE RATIO
Creatinine,U: 120.9 mg/dL
Microalb Creat Ratio: 13.9 mg/g (ref 0.0–30.0)
Microalb, Ur: 1.7 mg/dL (ref 0.0–1.9)

## 2024-10-04 LAB — HEMOGLOBIN A1C: Hgb A1c MFr Bld: 6.2 % (ref 4.6–6.5)

## 2024-10-04 LAB — TSH: TSH: 1.43 u[IU]/mL (ref 0.35–5.50)

## 2024-10-04 NOTE — Progress Notes (Signed)
 Subjective:    Patient ID: Michelle Long, female    DOB: 1946/09/28, 78 y.o.   MRN: [id number]  Chief Complaint  Patient presents with   Annual Exam    Pt states fasting     HPI Patient is in today for cpe.  Discussed the use of AI scribe software for clinical note transcription with the patient, who gave verbal consent to proceed.  History of Present Illness Michelle Long is a 78 year old female who presents for a routine follow-up visit.  Her blood sugars are well-controlled, with most readings around 95 mg/dL and a reading of 887 mg/dL today. She checks her blood sugar daily, alongside her daughter who also has diabetes.  Her asthma is well-controlled with Breztri, and she rarely needs to use albuterol .  She has received both doses of the shingles vaccine at [facility] on [address] in [place]. She had a normal mammogram in November and visits the dentist regularly.  No changes in her family history. She does not engage in regular exercise. She can feel the bottom of her feet when walking and gets regular pedicures.    Past Medical History:  Diagnosis Date   Anxiety    Asthma    Bipolar disorder [facility])    Bladder cancer (HCC)    Breast cancer (HCC)    Cancer of lower-inner quadrant of left female breast (HCC) 12/08/2013   COPD (chronic obstructive pulmonary disease) (HCC)    former smoker, quit 1999   Depression    Diabetes mellitus    GERD (gastroesophageal reflux disease)    Hepatitis B infection    History of transfusion of whole blood    Approximately 2009, due to breast cancer/chemo.   Hyperlipidemia    Hypertension    Personal history of chemotherapy    Personal history of radiation therapy    PONV (postoperative nausea and vomiting)     Past Surgical History:  Procedure Laterality Date   ABDOMINAL HYSTERECTOMY     APPENDECTOMY  1961   bladder cancer  2006   BREAST LUMPECTOMY Left 2007   BREAST LUMPECTOMY WITH RADIOACTIVE  SEED LOCALIZATION Right 03/09/2021   Procedure: RIGHT BREAST LUMPECTOMY WITH RADIOACTIVE SEED LOCALIZATION;  Surgeon: Curvin Deward MOULD, MD;  Location: [facility];  Service: General;  Laterality: Right;   PARTIAL HYSTERECTOMY  1991   TONSILLECTOMY  1951    Family History  Problem Relation Age of Onset   Arthritis Mother    Breast cancer Mother    Transient ischemic attack Mother    Hypertension Mother    Heart disease Father    Diabetes Father    Cancer Father        bladder cancer   Throat cancer Father    Breast cancer Sister     Social History   Socioeconomic History   Marital status: Divorced    Spouse name: Not on file   Number of children: Not on file   Years of education: Not on file   Highest education level: Bachelor's degree (e.g., BA, AB, BS)  Occupational History   Occupation: retired    Comment: retired  Tobacco Use   Smoking status: Former    Current packs/day: 0.00    Average packs/day: 1.5 packs/day for 33.7 years (50.6 ttl pk-yrs)    Types: Cigarettes    Start date: 12/08/1964    Quit date: 08/21/1998    Years since quitting: 26.1   Smokeless tobacco: Never  Tobacco comments:    quit 16 years ago  Vaping Use   Vaping status: Never Used  Substance and Sexual Activity   Alcohol use: Not Currently    Alcohol/week: 0.0 standard drinks of alcohol   Drug use: No   Sexual activity: Not Currently    Partners: Male    Birth control/protection: Surgical  Other Topics Concern   Not on file  Social History Narrative   Exercise--- no   Social Drivers of Health   Tobacco Use: Medium Risk (10/04/2024)   Patient History    Smoking Tobacco Use: Former    Smokeless Tobacco Use: Never    Passive Exposure: Not on Actuary Strain: Low Risk (09/02/2024)   Overall Financial Resource Strain (CARDIA)    Difficulty of Paying Living Expenses: Not hard at all  Food Insecurity: No Food Insecurity (09/02/2024)    Epic    Worried About Programme Researcher, Broadcasting/film/video in the Last Year: Never true    Ran Out of Food in the Last Year: Never true  Transportation Needs: No Transportation Needs (09/02/2024)   Epic    Lack of Transportation (Medical): No    Lack of Transportation (Non-Medical): No  Physical Activity: Inactive (09/02/2024)   Exercise Vital Sign    Days of Exercise per Week: 0 days    Minutes of Exercise per Session: Not on file  Stress: No Stress Concern Present (09/02/2024)   [employer] of Occupational Health - Occupational Stress Questionnaire    Feeling of Stress: Not at all  Social Connections: Moderately Integrated (09/02/2024)   Social Connection and Isolation Panel    Frequency of Communication with Friends and Family: More than three times a week    Frequency of Social Gatherings with Friends and Family: More than three times a week    Attends Religious Services: More than 4 times per year    Active Member of [employer] or Organizations: Yes    Attends Banker Meetings: More than 4 times per year    Marital Status: Divorced  Intimate Partner Violence: Not At Risk (10/09/2023)   Humiliation, Afraid, Rape, and Kick questionnaire    Fear of Current or Ex-Partner: No    Emotionally Abused: No    Physically Abused: No    Sexually Abused: No  Depression (PHQ2-9): Low Risk (09/03/2024)   Depression (PHQ2-9)    PHQ-2 Score: 0  Alcohol Screen: Low Risk (10/09/2023)   Alcohol Screen    Last Alcohol Screening Score (AUDIT): 0  Housing: Low Risk (09/02/2024)   Epic    Unable to Pay for Housing in the Last Year: No    Number of Times Moved in the Last Year: 0    Homeless in the Last Year: No  Utilities: Low Risk (07/06/2024)   Received from [facility]   Utilities    In the past 12 months has the electric, gas, oil, or water company threatened to shut off services in your home? : No  Health Literacy: Adequate Health Literacy (10/09/2023)   B1300  Health Literacy    Frequency of need for help with medical instructions: Never    Outpatient Medications Prior to Visit  Medication Sig Dispense Refill   albuterol  (VENTOLIN  HFA) 108 (90 Base) MCG/ACT inhaler INHALE 2 PUFFS EVERY 6 HOURS AS NEEDED FOR WHEEZING OR SHORTNESS OF BREATH 1 each 1   Alcohol Swabs (DROPSAFE ALCOHOL PREP) 70 % PADS To use before checking blood sugars 200 each 12   alendronate  (  FOSAMAX ) 70 MG tablet TAKE 1 TABLET BY MOUTH ONCE A WEEK WITH  A  FULL  GLASS  OF  WATER  ON  AN  EMPTY  STOMACH 12 tablet 2   aspirin 81 MG tablet Take 81 mg by mouth daily.     Blood Glucose Calibration (TRUE METRIX LEVEL 1) Low SOLN USE AS DIRECTED WITH GLUCOSE METER 1 each PRN   Blood Glucose Monitoring Suppl (TRUE METRIX AIR GLUCOSE METER) w/Device KIT Check blood sugars once daily 1 kit 0   BREZTRI AEROSPHERE 160-9-4.8 MCG/ACT AERO inhaler Inhale 2 puffs into the lungs.     Calcium  Carb-Cholecalciferol (CALCIUM  1000 + D PO) Take by mouth 2 (two) times daily.     Cholecalciferol (VITAMIN D ) 2000 units CAPS Take 2,000 Units by mouth daily.     citalopram  (CELEXA ) 10 MG tablet Take 0.5 tablets (5 mg total) by mouth at bedtime. 45 tablet 1   ezetimibe  (ZETIA ) 10 MG tablet Take 1 tablet (10 mg total) by mouth daily. 90 tablet 1   glucose blood (TRUE METRIX BLOOD GLUCOSE TEST) test strip TEST BLOOD SUGAR EVERY DAY 100 strip 1   LORazepam  (ATIVAN ) 0.5 MG tablet Take 0.5 mg by mouth daily as needed for anxiety.     losartan  (COZAAR ) 50 MG tablet Take 1 tablet (50 mg total) by mouth daily. 90 tablet 1   metFORMIN  (GLUCOPHAGE ) 1000 MG tablet TAKE 1 TABLET BY MOUTH TWICE  DAILY WITH MEALS 200 tablet 2   Multiple Vitamin (MULTIVITAMIN ADULT PO) Take 1 capsule by mouth daily at 6 (six) AM.     nateglinide  (STARLIX ) 60 MG tablet Take 1 tablet (60 mg total) by mouth 2 (two) times daily with a meal. 180 tablet 1   OLANZapine  (ZYPREXA ) 5 MG tablet 1/2 tablet daily (Patient taking  differently: Take 5 mg by mouth daily. 1 tablet daily) 45 tablet 1   pantoprazole  (PROTONIX ) 40 MG tablet Take 1 tablet (40 mg total) by mouth daily. 90 tablet 3   rosuvastatin  (CRESTOR ) 5 MG tablet TAKE 1 TABLET BY MOUTH 3 TIMES  WEEKLY 36 tablet 3   Spacer/Aero Chamber Mouthpiece MISC To use with inhaler 1 each 1   TRUEplus Lancets 33G MISC TEST BLOOD SUGAR EVERY DAY 100 each 1   No facility-administered medications prior to visit.    Allergies[1]  Review of Systems  Constitutional:  Negative for fever and malaise/fatigue.  HENT:  Negative for congestion.   Eyes:  Negative for blurred vision.  Respiratory:  Negative for shortness of breath.   Cardiovascular:  Negative for chest pain, palpitations and leg swelling.  Gastrointestinal:  Negative for abdominal pain, blood in stool and nausea.  Genitourinary:  Negative for dysuria and frequency.  Musculoskeletal:  Negative for falls.  Skin:  Negative for rash.  Neurological:  Negative for dizziness, loss of consciousness and headaches.  Endo/Heme/Allergies:  Negative for environmental allergies.  Psychiatric/Behavioral:  Negative for depression. The patient is not nervous/anxious.        Objective:    Physical Exam Vitals and nursing note reviewed.  Constitutional:      General: She is not in acute distress.    Appearance: Normal appearance. She is well-developed.  HENT:     Head: Normocephalic and atraumatic.     Right Ear: Tympanic membrane, ear canal and external ear normal. There is no impacted cerumen.     Left Ear: Tympanic membrane, ear canal and external ear normal. There is no impacted cerumen.  Nose: Nose normal.     Mouth/Throat:     Mouth: Mucous membranes are moist.     Pharynx: Oropharynx is clear. No oropharyngeal exudate or posterior oropharyngeal erythema.  Eyes:     General: No scleral icterus.       Right eye: No discharge.        Left eye: No discharge.     Conjunctiva/sclera: Conjunctivae normal.      Pupils: Pupils are equal, round, and reactive to light.  Neck:     Thyroid : No thyromegaly or thyroid  tenderness.     Vascular: No JVD.  Cardiovascular:     Rate and Rhythm: Normal rate and regular rhythm.     Heart sounds: Normal heart sounds. No murmur heard. Pulmonary:     Effort: Pulmonary effort is normal. No respiratory distress.     Breath sounds: Normal breath sounds.  Abdominal:     General: Bowel sounds are normal. There is no distension.     Palpations: Abdomen is soft. There is no mass.     Tenderness: There is no abdominal tenderness. There is no guarding or rebound.  Genitourinary:    Vagina: Normal.  Musculoskeletal:        General: Normal range of motion.     Cervical back: Normal range of motion and neck supple.     Right lower leg: No edema.     Left lower leg: No edema.  Lymphadenopathy:     Cervical: No cervical adenopathy.  Skin:    General: Skin is warm and dry.     Findings: No erythema or rash.  Neurological:     General: No focal deficit present.     Mental Status: She is alert and oriented to person, place, and time.     Cranial Nerves: No cranial nerve deficit.     Deep Tendon Reflexes: Reflexes are normal and symmetric.  Psychiatric:        Mood and Affect: Mood normal.        Behavior: Behavior normal.        Thought Content: Thought content normal.        Judgment: Judgment normal.     BP 122/70 (BP Location: Right Arm, Patient Position: Sitting, Cuff Size: Normal)   Pulse 99   Temp 98.4 F (36.9 C) (Oral)   Resp 16   Ht 5' 3 (1.6 m)   Wt 147 lb 6.4 oz (66.9 kg)   SpO2 96%   BMI 26.11 kg/m  Wt Readings from Last 3 Encounters:  10/04/24 147 lb 6.4 oz (66.9 kg)  09/03/24 146 lb 6.4 oz (66.4 kg)  07/15/24 142 lb 6.4 oz (64.6 kg)    Diabetic Foot Exam - Simple   No data filed    Lab Results  Component Value Date   WBC 6.3 03/30/2024   HGB 13.2 03/30/2024   HCT 41.5 03/30/2024   PLT 221.0 03/30/2024   GLUCOSE 95  07/15/2024   CHOL 145 07/15/2024   TRIG 95.0 07/15/2024   HDL 43.30 07/15/2024   LDLDIRECT 145.6 06/22/2013   LDLCALC 83 07/15/2024   ALT 30 07/15/2024   AST 32 07/15/2024   NA 140 07/15/2024   K 4.4 07/15/2024   CL 102 07/15/2024   CREATININE 0.81 07/15/2024   BUN 9 07/15/2024   CO2 28 07/15/2024   TSH 0.80 01/30/2015   INR 0.97 09/25/2011   HGBA1C 6.7 (H) 07/15/2024   MICROALBUR 4.9 (H) 03/30/2024    Lab Results  Component Value Date   TSH 0.80 01/30/2015   Lab Results  Component Value Date   WBC 6.3 03/30/2024   HGB 13.2 03/30/2024   HCT 41.5 03/30/2024   MCV 87.7 03/30/2024   PLT 221.0 03/30/2024   Lab Results  Component Value Date   NA 140 07/15/2024   K 4.4 07/15/2024   CHLORIDE 103 01/01/2017   CO2 28 07/15/2024   GLUCOSE 95 07/15/2024   BUN 9 07/15/2024   CREATININE 0.81 07/15/2024   BILITOT 0.4 07/15/2024   ALKPHOS 34 (L) 07/15/2024   AST 32 07/15/2024   ALT 30 07/15/2024   PROT 6.6 07/15/2024   ALBUMIN 4.3 07/15/2024   CALCIUM  9.8 07/15/2024   ANIONGAP 12 12/23/2023   EGFR 70 (L) 01/01/2017   GFR 69.60 07/15/2024   Lab Results  Component Value Date   CHOL 145 07/15/2024   Lab Results  Component Value Date   HDL 43.30 07/15/2024   Lab Results  Component Value Date   LDLCALC 83 07/15/2024   Lab Results  Component Value Date   TRIG 95.0 07/15/2024   Lab Results  Component Value Date   CHOLHDL 3 07/15/2024   Lab Results  Component Value Date   HGBA1C 6.7 (H) 07/15/2024       Assessment & Plan:  Preventative health care  Essential hypertension -     CBC with Differential/Platelet -     Comprehensive metabolic panel with GFR -     TSH  Diabetes mellitus type II, non insulin dependent (HCC) -     Comprehensive metabolic panel with GFR -     TSH -     Hemoglobin A1c -     Microalbumin / creatinine urine ratio  Hyperlipidemia associated with type 2 diabetes mellitus (HCC) -     Comprehensive metabolic panel with GFR -      Lipid panel  Mild intermittent asthma, unspecified whether complicated  Assessment and Plan Assessment & Plan Adult Wellness Visit   Routine adult wellness visit with no changes in family history. She completed regular dental visits and a mammogram in November. An eye doctor appointment is scheduled for this month. She reports no exercise. Blood sugar levels are well-controlled with daily monitoring. Continue regular dental visits, attend the scheduled eye doctor appointment, and maintain daily blood sugar monitoring.  Type 2 diabetes mellitus   Her diabetes is well-controlled with daily blood sugar monitoring. Recent readings are mostly around 95 mg/dL, with a recent reading of 112 mg/dL. Her daughter, who also has diabetes, checks her blood sugar daily, providing mutual encouragement. Continue daily blood sugar monitoring.  Mild intermittent asthma   Her asthma is well-controlled with Breztri, and she reports minimal use of albuterol . Continue Breztri for asthma management.    Jamee Michelle Shanks Chase, DO      [1] Allergies Allergen Reactions   Fluoxetine Other (See Comments)    seizure   Fluoxetine Hcl Other (See Comments)    seizure   Lamotrigine Itching and Other (See Comments)    itch   Sulfa Antibiotics Shortness Of Breath   Sulfa Antibiotics    Sulfa Drugs Cross Reactors Shortness Of Breath        Statins Other (See Comments)    Muscle pain  Muscle pains, Muscle pains

## 2024-10-05 NOTE — Progress Notes (Signed)
 Subjective:   Patient ID: Michelle Long is a 77 y.o. female.  HPI  78 year old ex-smoker, quit 1999, who presents for follow-up of COPD.  Patient feels her breathing is stable at this time.  She is planning to begin exercise more frequently after the holidays.  Recent PFTs showed results consistent with moderate COPD.  She reports no recent fever, chills, chest pain, or hemoptysis.  Patient has responded very well to Breztri.  Review of Systems   A complete ROS was performed with pertinent positives/negatives noted in the HPI. The remainder of the ROS is negative.  Objective:   Physical Exam   Constitutional: She is oriented to person, place, and time. She appears well-developed and well-nourished.  Head: Normocephalic and atraumatic.  Eyes: Conjunctivae are normal. Pupils are equal, round, and reactive to light.  Neck: Normal range of motion.  Cardiovascular: Normal rate and regular rhythm. Exam reveals no gallop and no friction rub.  No murmur heard. Pulmonary/Chest: Effort normal and breath sounds diminished. Abdominal: Soft.  Musculoskeletal: Normal range of motion.  Neurological: She is alert and oriented to person, place, and time.  Skin: Skin is warm and dry.  Psychiatric: She has a normal mood and affect. Her behavior is normal. Judgment and thought content normal.   Assessment/Plan:   1.  COPD, moderate  Recent PFT results were discussed in detail with patient today.  She will continue Breztri daily as directed.  We discussed referral to pulmonary rehabilitation.  Patient will notify our office at later date if she would like to proceed with evaluation for pulmonary rehabilitation.  Patient will follow-up in 6 months or visit sooner if needed.

## 2024-10-09 ENCOUNTER — Other Ambulatory Visit: Payer: Self-pay | Admitting: Family Medicine

## 2024-10-09 DIAGNOSIS — I10 Essential (primary) hypertension: Secondary | ICD-10-CM

## 2024-10-11 ENCOUNTER — Ambulatory Visit: Payer: Self-pay | Admitting: Family Medicine

## 2024-10-11 ENCOUNTER — Ambulatory Visit: Admitting: Family

## 2024-10-11 ENCOUNTER — Ambulatory Visit: Admitting: Medical

## 2024-10-11 ENCOUNTER — Encounter: Payer: Self-pay | Admitting: Family

## 2024-10-11 VITALS — BP 135/63 | HR 107 | Temp 98.2°F | Resp 16 | Ht 63.0 in | Wt 144.4 lb

## 2024-10-11 DIAGNOSIS — J209 Acute bronchitis, unspecified: Secondary | ICD-10-CM | POA: Diagnosis not present

## 2024-10-11 DIAGNOSIS — R059 Cough, unspecified: Secondary | ICD-10-CM

## 2024-10-11 MED ORDER — METHYLPREDNISOLONE 4 MG PO TBPK: ORAL_TABLET | ORAL | 0 refills | Status: DC

## 2024-10-11 NOTE — Progress Notes (Signed)
 "  Acute Office Visit  Subjective:     Patient ID: Michelle Long, female    DOB: [birth year], 78 y.o.   MRN: [id number]  Chief Complaint  Patient presents with   Cough    Patient complains of having a cough    HPI Patient is in today with complaints of cough, chest congestion, and wheezing that appears to be improving.  Symptoms have been ongoing for about 5 days.  She has been using a sore throat lozenges that have helped.  Denies any fever or chills.  No weakness.  She has a history of COPD  Review of Systems  Constitutional:  Negative for fever and weight loss.  HENT: Negative.    Respiratory:  Positive for cough and wheezing. Negative for sputum production.   Cardiovascular: Negative.   Musculoskeletal: Negative.   Skin: Negative.   Neurological: Negative.   Endo/Heme/Allergies: Negative.   Psychiatric/Behavioral: Negative.    All other systems reviewed and are negative.  Past Medical History:  Diagnosis Date   Anxiety    Asthma    Bipolar disorder [facility])    Bladder cancer (HCC)    Breast cancer (HCC)    Cancer of lower-inner quadrant of left female breast (HCC) 12/08/2013   COPD (chronic obstructive pulmonary disease) (HCC)    former smoker, quit 1999   Depression    Diabetes mellitus    GERD (gastroesophageal reflux disease)    Hepatitis B infection    History of transfusion of whole blood    Approximately 2009, due to breast cancer/chemo.   Hyperlipidemia    Hypertension    Personal history of chemotherapy    Personal history of radiation therapy    PONV (postoperative nausea and vomiting)     Social History   Socioeconomic History   Marital status: Divorced    Spouse name: Not on file   Number of children: Not on file   Years of education: Not on file   Highest education level: Bachelor's degree (e.g., BA, AB, BS)  Occupational History   Occupation: retired    Comment: retired  Tobacco Use   Smoking status: Former    Current  packs/day: 0.00    Average packs/day: 1.5 packs/day for 33.7 years (50.6 ttl pk-yrs)    Types: Cigarettes    Start date: 12/08/1964    Quit date: 08/21/1998    Years since quitting: 26.1   Smokeless tobacco: Never   Tobacco comments:    quit 16 years ago  Vaping Use   Vaping status: Never Used  Substance and Sexual Activity   Alcohol use: Not Currently    Alcohol/week: 0.0 standard drinks of alcohol   Drug use: No   Sexual activity: Not Currently    Partners: Male    Birth control/protection: Surgical  Other Topics Concern   Not on file  Social History Narrative   Exercise--- no   Social Drivers of Health   Tobacco Use: Medium Risk (10/11/2024)   Patient History    Smoking Tobacco Use: Former    Smokeless Tobacco Use: Never    Passive Exposure: Not on file  Financial Resource Strain: Low Risk (09/02/2024)   Overall Financial Resource Strain (CARDIA)    Difficulty of Paying Living Expenses: Not hard at all  Food Insecurity: Low Risk (10/05/2024)   Received from [facility]   Epic    Within the past 12 months, you worried that your food would run out before you got money to buy more: Never  true    Within the past 12 months, the food you bought just didn't last and you didn't have money to get more. : Never true  Transportation Needs: No Transportation Needs (10/05/2024)   Received from [facility]    In the past 12 months, has lack of reliable transportation kept you from medical appointments, meetings, work or from getting things needed for daily living? : No  Physical Activity: Inactive (09/02/2024)   Exercise Vital Sign    Days of Exercise per Week: 0 days    Minutes of Exercise per Session: Not on file  Stress: No Stress Concern Present (09/02/2024)   [employer] of Occupational Health - Occupational Stress Questionnaire    Feeling of Stress: Not at all  Social Connections: Moderately Integrated (09/02/2024)   Social  Connection and Isolation Panel    Frequency of Communication with Friends and Family: More than three times a week    Frequency of Social Gatherings with Friends and Family: More than three times a week    Attends Religious Services: More than 4 times per year    Active Member of [employer] or Organizations: Yes    Attends Banker Meetings: More than 4 times per year    Marital Status: Divorced  Intimate Partner Violence: Not At Risk (10/09/2023)   Humiliation, Afraid, Rape, and Kick questionnaire    Fear of Current or Ex-Partner: No    Emotionally Abused: No    Physically Abused: No    Sexually Abused: No  Depression (PHQ2-9): Low Risk (09/03/2024)   Depression (PHQ2-9)    PHQ-2 Score: 0  Alcohol Screen: Low Risk (10/09/2023)   Alcohol Screen    Last Alcohol Screening Score (AUDIT): 0  Housing: Low Risk (10/05/2024)   Received from [facility]   Epic    What is your living situation today?: I have a steady place to live    Think about the place you live. Do you have problems with any of the following? Choose all that apply:: Not on file  Utilities: Low Risk (10/05/2024)   Received from [facility]   Utilities    In the past 12 months has the electric, gas, oil, or water company threatened to shut off services in your home? : No  Health Literacy: Adequate Health Literacy (10/09/2023)   B1300 Health Literacy    Frequency of need for help with medical instructions: Never    Past Surgical History:  Procedure Laterality Date   ABDOMINAL HYSTERECTOMY     APPENDECTOMY  1961   bladder cancer  2006   BREAST LUMPECTOMY Left 2007   BREAST LUMPECTOMY WITH RADIOACTIVE SEED LOCALIZATION Right 03/09/2021   Procedure: RIGHT BREAST LUMPECTOMY WITH RADIOACTIVE SEED LOCALIZATION;  Surgeon: Curvin Deward MOULD, MD;  Location:  SURGERY CENTER;  Service: General;  Laterality: Right;   PARTIAL HYSTERECTOMY  1991   TONSILLECTOMY  1951    Family History   Problem Relation Age of Onset   Arthritis Mother    Breast cancer Mother    Transient ischemic attack Mother    Hypertension Mother    Heart disease Father    Diabetes Father    Cancer Father        bladder cancer   Throat cancer Father    Breast cancer Sister     Allergies[1]  Medications Ordered Prior to Encounter[2]  BP 135/63 (BP Location: Right Arm, Patient Position: Sitting, Cuff Size: Normal)   Pulse (!) 107  Temp 98.2 F (36.8 C) (Oral)   Resp 16   Ht 5' 3 (1.6 m)   Wt 144 lb 6.4 oz (65.5 kg)   SpO2 98%   BMI 25.58 kg/m chart      Objective:    BP 135/63 (BP Location: Right Arm, Patient Position: Sitting, Cuff Size: Normal)   Pulse (!) 107   Temp 98.2 F (36.8 C) (Oral)   Resp 16   Ht 5' 3 (1.6 m)   Wt 144 lb 6.4 oz (65.5 kg)   SpO2 98%   BMI 25.58 kg/m    Physical Exam Vitals and nursing note reviewed.  Constitutional:      Appearance: Normal appearance. She is obese.  HENT:     Right Ear: Tympanic membrane, ear canal and external ear normal.     Left Ear: Tympanic membrane, ear canal and external ear normal.  Cardiovascular:     Rate and Rhythm: Normal rate and regular rhythm.  Pulmonary:     Effort: Pulmonary effort is normal.     Breath sounds: Wheezing present. No rhonchi or rales.     Comments: Mild wheezing Abdominal:     General: Abdomen is flat. Bowel sounds are normal.     Palpations: Abdomen is soft.  Musculoskeletal:        General: Normal range of motion.     Cervical back: Normal range of motion and neck supple.  Skin:    General: Skin is warm and dry.  Neurological:     General: No focal deficit present.     Mental Status: She is alert and oriented to person, place, and time. Mental status is at baseline.  Psychiatric:        Mood and Affect: Mood normal.        Thought Content: Thought content normal.        Judgment: Judgment normal.    No results found for any visits on 10/11/24.      Assessment &  Plan:   Problem List Items Addressed This Visit   None Visit Diagnoses       Acute bronchitis, unspecified organism    -  Primary     Cough, unspecified type           Meds ordered this encounter  Medications   methylPREDNISolone  (MEDROL  DOSEPAK) 4 MG TBPK tablet    Sig: As directed    Dispense:  21 tablet    Refill:  0   Since she still has some mild wheezing a Medrol  Dosepak was sent to the pharmacy for her.  Call the office if symptoms worsen or persist.  Recheck as scheduled or sooner as needed. No follow-ups on file.  Jawanza Zambito B Tinslee Klare, FNP       [1] Allergies Allergen Reactions   Fluoxetine Other (See Comments)    seizure   Fluoxetine Hcl Other (See Comments)    seizure   Lamotrigine Itching and Other (See Comments)    itch   Sulfa Antibiotics Shortness Of Breath   Sulfa Antibiotics    Sulfa Drugs Cross Reactors Shortness Of Breath        Statins Other (See Comments)    Muscle pain  Muscle pains, Muscle pains  [2] Current Outpatient Medications on File Prior to Visit  Medication Sig Dispense Refill   albuterol  (VENTOLIN  HFA) 108 (90 Base) MCG/ACT inhaler INHALE 2 PUFFS EVERY 6 HOURS AS NEEDED FOR WHEEZING OR SHORTNESS OF BREATH 1 each 1   Alcohol Swabs (DROPSAFE  ALCOHOL PREP) 70 % PADS To use before checking blood sugars 200 each 12   alendronate  (FOSAMAX ) 70 MG tablet TAKE 1 TABLET BY MOUTH ONCE A WEEK WITH  A  FULL  GLASS  OF  WATER  ON  AN  EMPTY  STOMACH 12 tablet 2   aspirin 81 MG tablet Take 81 mg by mouth daily.     Blood Glucose Calibration (TRUE METRIX LEVEL 1) Low SOLN USE AS DIRECTED WITH GLUCOSE METER 1 each PRN   Blood Glucose Monitoring Suppl (TRUE METRIX AIR GLUCOSE METER) w/Device KIT Check blood sugars once daily 1 kit 0   BREZTRI AEROSPHERE 160-9-4.8 MCG/ACT AERO inhaler Inhale 2 puffs into the lungs.     Calcium  Carb-Cholecalciferol (CALCIUM  1000 + D PO) Take by mouth 2 (two) times daily.     Cholecalciferol (VITAMIN D )  2000 units CAPS Take 2,000 Units by mouth daily.     citalopram  (CELEXA ) 10 MG tablet Take 0.5 tablets (5 mg total) by mouth at bedtime. 45 tablet 1   ezetimibe  (ZETIA ) 10 MG tablet Take 1 tablet (10 mg total) by mouth daily. 90 tablet 1   glucose blood (TRUE METRIX BLOOD GLUCOSE TEST) test strip TEST BLOOD SUGAR EVERY DAY 100 strip 1   LORazepam  (ATIVAN ) 0.5 MG tablet Take 0.5 mg by mouth daily as needed for anxiety.     losartan  (COZAAR ) 50 MG tablet TAKE 1 TABLET BY MOUTH DAILY 100 tablet 1   metFORMIN  (GLUCOPHAGE ) 1000 MG tablet TAKE 1 TABLET BY MOUTH TWICE  DAILY WITH MEALS 200 tablet 2   Multiple Vitamin (MULTIVITAMIN ADULT PO) Take 1 capsule by mouth daily at 6 (six) AM.     nateglinide  (STARLIX ) 60 MG tablet Take 1 tablet (60 mg total) by mouth 2 (two) times daily with a meal. 180 tablet 1   OLANZapine  (ZYPREXA ) 5 MG tablet 1/2 tablet daily (Patient taking differently: Take 5 mg by mouth daily. 1 tablet daily) 45 tablet 1   pantoprazole  (PROTONIX ) 40 MG tablet Take 1 tablet (40 mg total) by mouth daily. 90 tablet 3   rosuvastatin  (CRESTOR ) 5 MG tablet TAKE 1 TABLET BY MOUTH 3 TIMES  WEEKLY 36 tablet 3   Spacer/Aero Chamber Mouthpiece MISC To use with inhaler 1 each 1   TRUEplus Lancets 33G MISC TEST BLOOD SUGAR EVERY DAY 100 each 1   No current facility-administered medications on file prior to visit.  "

## 2024-10-12 ENCOUNTER — Ambulatory Visit: Payer: Medicare HMO

## 2024-10-12 VITALS — Ht 63.0 in | Wt 144.0 lb

## 2024-10-12 DIAGNOSIS — Z0000 Encounter for general adult medical examination without abnormal findings: Secondary | ICD-10-CM | POA: Diagnosis not present

## 2024-10-12 NOTE — Progress Notes (Signed)
 "  Please attest this visit in the absence of patient primary care provider.    Chief Complaint  Patient presents with   Medicare Wellness     Subjective:   ELYSIA Long is a 78 y.o. female who presents for a Medicare Annual Wellness Visit.  Visit info / Clinical Intake: Medicare Wellness Visit Type:: Subsequent Annual Wellness Visit Persons participating in visit and providing information:: patient Medicare Wellness Visit Mode:: Telephone If telephone:: video declined Since this visit was completed virtually, some vitals may be partially provided or unavailable. Missing vitals are due to the limitations of the virtual format.: Unable to obtain vitals - no equipment If Telephone or Video please confirm:: I connected with patient using audio/video enable telemedicine. I verified patient identity with two identifiers, discussed telehealth limitations, and patient agreed to proceed. Patient Location:: home Provider Location:: office Interpreter Needed?: No Pre-visit prep was completed: yes AWV questionnaire completed by patient prior to visit?: no Living arrangements:: with family/others (daughter) Patient's Overall Health Status Rating: very good Typical amount of pain: some Does pain affect daily life?: no Are you currently prescribed opioids?: no  Dietary Habits and Nutritional Risks How many meals a day?: 3 (2-3 meals) Eats fruit and vegetables daily?: yes Most meals are obtained by: preparing own meals In the last 2 weeks, have you had any of the following?: none Diabetic:: (!) yes Any non-healing wounds?: no How often do you check your BS?: 1 Would you like to be referred to a Nutritionist or for Diabetic Management? : no  Functional Status Activities of Daily Living (to include ambulation/medication): Independent Ambulation: Independent Medication Administration: Independent Home Management (perform basic housework or laundry): Independent Manage your own finances?:  yes Primary transportation is: driving Concerns about vision?: no *vision screening is required for WTM* (scheduled on 12/29 with [facility]) Concerns about hearing?: no  Fall Screening Falls in the past year?: 1 (tripped over uneven ground) Number of falls in past year: 0 Was there an injury with Fall?: 0 Fall Risk Category Calculator: 1 Patient Fall Risk Level: Low Fall Risk  Fall Risk Patient at Risk for Falls Due to: History of fall(s) Fall risk Follow up: Falls evaluation completed  Home and Transportation Safety: All rugs have non-skid backing?: yes All stairs or steps have railings?: yes (steps into the house) Grab bars in the bathtub or shower?: yes Have non-skid surface in bathtub or shower?: yes Good home lighting?: yes Regular seat belt use?: yes Long stays in the last year:: no  Cognitive Assessment Difficulty concentrating, remembering, or making decisions? : no Will 6CIT or Mini Cog be Completed: yes What year is it?: 0 points What month is it?: 0 points Give patient an address phrase to remember (5 components): [address] [place]  About what time is it?: 0 points Count backwards from 20 to 1: 0 points Say the months of the year in reverse: 0 points Repeat the address phrase from earlier: 0 points 6 CIT Score: 0 points  Advance Directives (For Healthcare) Does Patient Have a Medical Advance Directive?: Yes Does patient want to make changes to medical advance directive?: No - Patient declined Type of Advance Directive: Healthcare Power of [place]; Living will Copy of Healthcare Power of Attorney in Chart?: Yes - validated most recent copy scanned in chart (See row information) Copy of Living Will in Chart?: Yes - validated most recent copy scanned in chart (See row information) Would patient like information on creating a medical advance directive?: No -  Patient declined  Reviewed/Updated  Reviewed/Updated: Reviewed All (Medical, Surgical,  Family, Medications, Allergies, Care Teams, Patient Goals)    Allergies (verified) Fluoxetine, Fluoxetine hcl, Lamotrigine, Sulfa antibiotics, Sulfa antibiotics, Sulfa drugs cross reactors, and Statins   Current Medications (verified) Outpatient Encounter Medications as of 10/12/2024  Medication Sig   albuterol  (VENTOLIN  HFA) 108 (90 Base) MCG/ACT inhaler INHALE 2 PUFFS EVERY 6 HOURS AS NEEDED FOR WHEEZING OR SHORTNESS OF BREATH   Alcohol Swabs (DROPSAFE ALCOHOL PREP) 70 % PADS To use before checking blood sugars   alendronate  (FOSAMAX ) 70 MG tablet TAKE 1 TABLET BY MOUTH ONCE A WEEK WITH  A  FULL  GLASS  OF  WATER  ON  AN  EMPTY  STOMACH   aspirin 81 MG tablet Take 81 mg by mouth daily.   Blood Glucose Calibration (TRUE METRIX LEVEL 1) Low SOLN USE AS DIRECTED WITH GLUCOSE METER   Blood Glucose Monitoring Suppl (TRUE METRIX AIR GLUCOSE METER) w/Device KIT Check blood sugars once daily   BREZTRI AEROSPHERE 160-9-4.8 MCG/ACT AERO inhaler Inhale 2 puffs into the lungs.   Calcium  Carb-Cholecalciferol (CALCIUM  1000 + D PO) Take by mouth 2 (two) times daily.   Cholecalciferol (VITAMIN D ) 2000 units CAPS Take 2,000 Units by mouth daily.   citalopram  (CELEXA ) 10 MG tablet Take 0.5 tablets (5 mg total) by mouth at bedtime.   ezetimibe  (ZETIA ) 10 MG tablet Take 1 tablet (10 mg total) by mouth daily.   glucose blood (TRUE METRIX BLOOD GLUCOSE TEST) test strip TEST BLOOD SUGAR EVERY DAY   LORazepam  (ATIVAN ) 0.5 MG tablet Take 0.5 mg by mouth daily as needed for anxiety.   losartan  (COZAAR ) 50 MG tablet TAKE 1 TABLET BY MOUTH DAILY   metFORMIN  (GLUCOPHAGE ) 1000 MG tablet TAKE 1 TABLET BY MOUTH TWICE  DAILY WITH MEALS   methylPREDNISolone  (MEDROL  DOSEPAK) 4 MG TBPK tablet As directed   Multiple Vitamin (MULTIVITAMIN ADULT PO) Take 1 capsule by mouth daily at 6 (six) AM.   nateglinide  (STARLIX ) 60 MG tablet Take 1 tablet (60 mg total) by mouth 2 (two) times daily with a meal.   OLANZapine  (ZYPREXA ) 5  MG tablet 1/2 tablet daily (Patient taking differently: Take 5 mg by mouth daily. 1 tablet daily)   pantoprazole  (PROTONIX ) 40 MG tablet Take 1 tablet (40 mg total) by mouth daily.   rosuvastatin  (CRESTOR ) 5 MG tablet TAKE 1 TABLET BY MOUTH 3 TIMES  WEEKLY   Spacer/Aero Chamber Mouthpiece MISC To use with inhaler   TRUEplus Lancets 33G MISC TEST BLOOD SUGAR EVERY DAY   No facility-administered encounter medications on file as of 10/12/2024.    History: Past Medical History:  Diagnosis Date   Anxiety    Asthma    Bipolar disorder (HCC)    Bladder cancer (HCC)    Breast cancer (HCC)    Cancer of lower-inner quadrant of left female breast (HCC) 12/08/2013   COPD (chronic obstructive pulmonary disease) (HCC)    former smoker, quit 1999   Depression    Diabetes mellitus    GERD (gastroesophageal reflux disease)    Hepatitis B infection    History of transfusion of whole blood    Approximately 2009, due to breast cancer/chemo.   Hyperlipidemia    Hypertension    Personal history of chemotherapy    Personal history of radiation therapy    PONV (postoperative nausea and vomiting)    Past Surgical History:  Procedure Laterality Date   ABDOMINAL HYSTERECTOMY     APPENDECTOMY  1961   bladder cancer  2006   BREAST LUMPECTOMY Left 2007   BREAST LUMPECTOMY WITH RADIOACTIVE SEED LOCALIZATION Right 03/09/2021   Procedure: RIGHT BREAST LUMPECTOMY WITH RADIOACTIVE SEED LOCALIZATION;  Surgeon: Curvin Deward MOULD, MD;  Location: [facility];  Service: General;  Laterality: Right;   PARTIAL HYSTERECTOMY  1991   TONSILLECTOMY  1951   Family History  Problem Relation Age of Onset   Arthritis Mother    Breast cancer Mother    Transient ischemic attack Mother    Hypertension Mother    Heart disease Father    Diabetes Father    Cancer Father        bladder cancer   Throat cancer Father    Breast cancer Sister    Social History   Occupational History   Occupation: retired     Comment: retired  Tobacco Use   Smoking status: Former    Current packs/day: 0.00    Average packs/day: 1.5 packs/day for 33.7 years (50.6 ttl pk-yrs)    Types: Cigarettes    Start date: 12/08/1964    Quit date: 08/21/1998    Years since quitting: 26.1   Smokeless tobacco: Never   Tobacco comments:    quit 16 years ago  Vaping Use   Vaping status: Never Used  Substance and Sexual Activity   Alcohol use: Not Currently    Alcohol/week: 0.0 standard drinks of alcohol   Drug use: No   Sexual activity: Not Currently    Partners: Male    Birth control/protection: Surgical   Tobacco Counseling Counseling given: Not Answered Tobacco comments: quit 16 years ago  SDOH Screenings   Food Insecurity: No Food Insecurity (10/12/2024)  Housing: Low Risk (10/12/2024)  Transportation Needs: No Transportation Needs (10/12/2024)  Utilities: Not At Risk (10/12/2024)  Alcohol Screen: Low Risk (10/09/2023)  Depression (PHQ2-9): Low Risk (10/12/2024)  Financial Resource Strain: Low Risk (09/02/2024)  Physical Activity: Inactive (10/12/2024)  Social Connections: Moderately Integrated (10/12/2024)  Stress: No Stress Concern Present (10/12/2024)  Tobacco Use: Medium Risk (10/12/2024)  Health Literacy: Adequate Health Literacy (10/09/2023)   See flowsheets for full screening details  Depression Screen PHQ 2 & 9 Depression Scale- Over the past 2 weeks, how often have you been bothered by any of the following problems? Little interest or pleasure in doing things: 0 Feeling down, depressed, or hopeless (PHQ Adolescent also includes...irritable): 0 PHQ-2 Total Score: 0 Trouble falling or staying asleep, or sleeping too much: 0 Feeling tired or having little energy: 1 (takes vitamins) Poor appetite or overeating (PHQ Adolescent also includes...weight loss): 0 Feeling bad about yourself - or that you are a failure or have let yourself or your family down: 0 Trouble concentrating on things, such as  reading the newspaper or watching television (PHQ Adolescent also includes...like school work): 0 Moving or speaking so slowly that other people could have noticed. Or the opposite - being so fidgety or restless that you have been moving around a lot more than usual: 0 Thoughts that you would be better off dead, or of hurting yourself in some way: 0 PHQ-9 Total Score: 1 If you checked off any problems, how difficult have these problems made it for you to do your work, take care of things at home, or get along with other people?: Not difficult at all     Goals Addressed             This Visit's Progress    Eat more fruits and  vegetables   On track            Objective:    Today's Vitals   10/12/24 1307  Weight: 144 lb (65.3 kg)  Height: 5' 3 (1.6 m)   Body mass index is 25.51 kg/m.  Hearing/Vision screen No results found. Immunizations and Health Maintenance Health Maintenance  Topic Date Due   OPHTHALMOLOGY EXAM  10/05/2024   COVID-19 Vaccine (8 - Moderna risk 2025-26 season) 01/19/2025   HEMOGLOBIN A1C  04/04/2025   Mammogram  08/23/2025   DTaP/Tdap/Td (4 - Td or Tdap) 09/19/2025   Diabetic kidney evaluation - eGFR measurement  10/04/2025   Diabetic kidney evaluation - Urine ACR  10/04/2025   FOOT EXAM  10/04/2025   Medicare Annual Wellness (AWV)  10/12/2025   Colonoscopy  12/17/2027   Pneumococcal Vaccine: 50+ Years  Completed   Influenza Vaccine  Completed   Bone Density Scan  Completed   Hepatitis C Screening  Completed   Zoster Vaccines- Shingrix  Completed   Meningococcal B Vaccine  Aged Out   Hepatitis B Vaccines 19-59 Average Risk  Discontinued        Assessment/Plan:  This is a routine wellness examination for Michelle Long.  Patient Care Team: Antonio Meth, Jamee SAUNDERS, DO as PCP - General (Family Medicine) Marvis Ronal BIRCH., MD as Referring Physician (Gastroenterology) Jess Roby Masker, MD (Psychiatry) Norleen Shaver as Consulting Physician  (Optometry) Marty Mealing, DDS (Dentistry) Sammie Numbers, [place] (Physician Assistant) Timmy Maude SAUNDERS, MD as Consulting Physician (Oncology) Dermatology, [facility]  I have personally reviewed and noted the following in the patients chart:   Medical and social history Use of alcohol, tobacco or illicit drugs  Current medications and supplements including opioid prescriptions. Functional ability and status Nutritional status Physical activity Advanced directives List of other physicians Hospitalizations, surgeries, and ER visits in previous 12 months Vitals Screenings to include cognitive, depression, and falls Referrals and appointments  No orders of the defined types were placed in this encounter.  In addition, I have reviewed and discussed with patient certain preventive protocols, quality metrics, and best practice recommendations. A written personalized care plan for preventive services as well as general preventive health recommendations were provided to patient.   Lolita Libra, CMA   10/12/2024   Return in 1 year (on 10/12/2025).  After Visit Summary: (MyChart) Due to this being a telephonic visit, the after visit summary with patients personalized plan was offered to patient via MyChart   Nurse Notes: HM Addressed: HM up to date except Shingles vaccine. Awaiting verification from pharmacy.  "

## 2024-10-12 NOTE — Patient Instructions (Addendum)
 Ms. Buxbaum,  Thank you for taking the time for your Medicare Wellness Visit. I appreciate your continued commitment to your health goals. Please review the care plan we discussed, and feel free to reach out if I can assist you further.  Please note that Annual Wellness Visits do not include a physical exam. Some assessments may be limited, especially if the visit was conducted virtually. If needed, we may recommend an in-person follow-up with your provider.  Ongoing Care Seeing your primary care provider every 3 to 6 months helps us  monitor your health and provide consistent, personalized care.   Dr Antonio Meth: 01/03/25 11am Medicare AWV: 10/18/25 1:40pm, telephone  Referrals If a referral was made during today's visit and you haven't received any updates within two weeks, please contact the referred provider directly to check on the status.  [facility]:  Please keep your upcoming appointment and ask them to send a copy to Dr Antonio Meth.  Recommended Screenings:  Health Maintenance  Topic Date Due   Medicare Annual Wellness Visit  10/08/2024   Eye exam for diabetics  10/05/2024   COVID-19 Vaccine (8 - Moderna risk 2025-26 season) 01/19/2025   Hemoglobin A1C  04/04/2025   Breast Cancer Screening  08/23/2025   DTaP/Tdap/Td vaccine (4 - Td or Tdap) 09/19/2025   Yearly kidney function blood test for diabetes  10/04/2025   Yearly kidney health urinalysis for diabetes  10/04/2025   Complete foot exam   10/04/2025   Colon Cancer Screening  12/17/2027   Pneumococcal Vaccine for age over 36  Completed   Flu Shot  Completed   Osteoporosis screening with Bone Density Scan  Completed   Hepatitis C Screening  Completed   Zoster (Shingles) Vaccine  Completed   Meningitis B Vaccine  Aged Out   Hepatitis B Vaccine  Discontinued       10/12/2024    1:09 PM  Advanced Directives  Does Patient Have a Medical Advance Directive? Yes  Type of Estate Agent of [place];Living  will  Does patient want to make changes to medical advance directive? No - Patient declined  Copy of Healthcare Power of Attorney in Chart? Yes - validated most recent copy scanned in chart (See row information)    Vision: Annual vision screenings are recommended for early detection of glaucoma, cataracts, and diabetic retinopathy. These exams can also reveal signs of chronic conditions such as diabetes and high blood pressure.  Dental: Annual dental screenings help detect early signs of oral cancer, gum disease, and other conditions linked to overall health, including heart disease and diabetes.  Please see the attached documents for additional preventive care recommendations.

## 2024-10-20 ENCOUNTER — Other Ambulatory Visit: Payer: Self-pay | Admitting: Family Medicine

## 2024-10-22 ENCOUNTER — Telehealth: Payer: Self-pay

## 2024-10-22 NOTE — Transitions of Care (Post Inpatient/ED Visit) (Signed)
 "  10/22/2024  Name: Michelle Long MRN: [id number] DOB: 05/18/1946  Today's TOC FU Call Status: Today's TOC FU Call Status:: Successful TOC FU Call Completed TOC FU Call Complete Date: 10/22/24  Patient's Name and Date of Birth confirmed. Name, DOB  Transition Care Management Follow-up Telephone Call Date of Discharge: 10/20/24 Discharge Facility: Other [facility]) Name of Other (Non-[facility]) Discharge Facility: [facility] Type of Discharge: Inpatient Admission Primary Inpatient Discharge Diagnosis:: Community acquired bacterial pneumonia How have you been since you were released from the hospital?: Better Any questions or concerns?: No  Items Reviewed: Did you receive and understand the discharge instructions provided?: Yes Medications obtained,verified, and reconciled?: Yes (Medications Reviewed) Any new allergies since your discharge?: No Dietary orders reviewed?: NA Do you have support at home?: Yes People in Home [RPT]: child(ren), adult Name of Support/Comfort Primary Source: Lives with patient - Almarie  Medications Reviewed Today: Medications Reviewed Today     Reviewed by Lauro Shona LABOR, RN (Registered Nurse) on 10/22/24 at 1136  Med List Status: <None>   Medication Order Taking? Sig Documenting Provider Last Dose Status Informant  albuterol  (VENTOLIN  HFA) 108 (90 Base) MCG/ACT inhaler [id number] Yes INHALE 2 PUFFS EVERY 6 HOURS AS NEEDED FOR WHEEZING OR SHORTNESS OF BREATH Antonio Cyndee Jamee JONELLE, DO  Active   Alcohol Swabs (DROPSAFE ALCOHOL PREP) 70 % PADS [id number] Yes To use before checking blood sugars Antonio Cyndee, Jamee JONELLE, DO  Active   alendronate  (FOSAMAX ) 70 MG tablet [id number] Yes TAKE 1 TABLET BY MOUTH ONCE A WEEK WITH  A  FULL  GLASS  OF  WATER  ON  AN  EMPTY  STOMACH Antonio Cyndee Jamee JONELLE, DO  Active   aspirin 81 MG tablet [id number] Yes Take 81 mg by mouth daily. [provider]  Active   benzonatate (TESSALON) 200 MG capsule [id number] Yes Take 200  mg by mouth 3 (three) times daily as needed for cough. [provider]  Active   Blood Glucose Calibration (TRUE METRIX LEVEL 1) Low SOLN [id number] Yes USE AS DIRECTED WITH GLUCOSE METER Antonio Cyndee, Yvonne R, DO  Active   Blood Glucose Monitoring Suppl (TRUE METRIX AIR GLUCOSE METER) w/Device KIT [id number] Yes Check blood sugars once daily Lowne Chase, Yvonne R, DO  Active   BREZTRI AEROSPHERE 160-9-4.8 MCG/ACT AERO inhaler [id number] Yes Inhale 2 puffs into the lungs. [provider]  Active   Calcium  Carb-Cholecalciferol (CALCIUM  1000 + D PO) [id number]  Take by mouth 2 (two) times daily.  Patient not taking: Reported on 10/22/2024   [provider]  Active            Med Note CECILLE, STACEY I   Tue Dec 23, 2023 11:51 AM) Calcium  also contains Magnesium and Zinc.  Cholecalciferol (VITAMIN D ) 2000 units CAPS [id number] Yes Take 2,000 Units by mouth daily. [provider]  Active   citalopram  (CELEXA ) 10 MG tablet [id number] Yes Take 0.5 tablets (5 mg total) by mouth at bedtime. Antonio Cyndee Jamee R, DO  Active   ezetimibe  (ZETIA ) 10 MG tablet [id number] Yes Take 1 tablet (10 mg total) by mouth daily. Antonio Cyndee, Yvonne R, DO  Active   glucose blood (TRUE METRIX BLOOD GLUCOSE TEST) test strip [id number] Yes TEST BLOOD SUGAR EVERY DAY Antonio Cyndee, Yvonne R, DO  Active   LORazepam  (ATIVAN ) 0.5 MG tablet [id number] Yes Take 0.5 mg by mouth daily as needed for anxiety.  Patient taking differently: Take 0.5 mg by mouth daily as  needed for anxiety. DC summary from 12/31 discharge shows 1mg  - patient confirmed   [provider]  Active   losartan  (COZAAR ) 50 MG tablet [id number] Yes TAKE 1 TABLET BY MOUTH DAILY Antonio Meth, Yvonne R, DO  Active   metFORMIN  (GLUCOPHAGE ) 1000 MG tablet [id number] Yes TAKE 1 TABLET BY MOUTH TWICE  DAILY WITH MEALS Antonio Meth, Yvonne R, DO  Active   methylPREDNISolone  (MEDROL  DOSEPAK) 4 MG TBPK tablet [id number]  As directed  Patient not  taking: Reported on 10/22/2024   Webb, Padonda B, FNP  Consider Medication Status and Discontinue (Completed Course)   Multiple Vitamin (MULTIVITAMIN ADULT PO) [id number] Yes Take 1 capsule by mouth daily at 6 (six) AM. [provider]  Active   nateglinide  (STARLIX ) 60 MG tablet [id number] Yes TAKE 1 TABLET BY MOUTH TWICE  DAILY WITH A MEAL Lowne Chase, Yvonne R, DO  Active   OLANZapine  (ZYPREXA ) 5 MG tablet [id number] Yes 1/2 tablet daily  Patient taking differently: Take 5 mg by mouth daily. 1 tablet daily @ bedtime   Antonio Meth, Yvonne R, DO  Active Self  pantoprazole  (PROTONIX ) 40 MG tablet [id number] Yes Take 1 tablet (40 mg total) by mouth daily. Antonio Meth Jamee JONELLE, DO  Active   predniSONE  (DELTASONE ) 20 MG tablet [id number] Yes Take 20 mg by mouth daily with breakfast.  Patient taking differently: Take 20 mg by mouth daily with breakfast. Take 2 tabs daily for 3 doses   [provider]  Active   rosuvastatin  (CRESTOR ) 5 MG tablet [id number] Yes TAKE 1 TABLET BY MOUTH 3 TIMES  WEEKLY  Patient taking differently: Patient taking 3x/week   Antonio Meth Jamee JONELLE, DO  Active   Spacer/Aero Chamber Mouthpiece MISC [id number] Yes To use with inhaler Antonio Meth Jamee JONELLE, DO  Active   TRUEplus Lancets 33G MISC [id number] Yes TEST BLOOD SUGAR EVERY DAY Antonio Meth Jamee JONELLE, DO  Active             Home Care and Equipment/Supplies: Were Home Health Services Ordered?: NA Any new equipment or medical supplies ordered?: Yes Name of Medical supply agency?: Went home from hospital with [facility] Were you able to get the equipment/medical supplies?: Yes Do you have any questions related to the use of the equipment/supplies?: No  Functional Questionnaire: Do you need assistance with bathing/showering or dressing?: No Do you need assistance with meal preparation?: No Do you need assistance with eating?: No Do you have difficulty maintaining continence: Yes (having diarrhea after  starting antibiotic and has had episode of bowel incontinence) Do you need assistance with getting out of bed/getting out of a chair/moving?: No Do you have difficulty managing or taking your medications?: Yes  Follow up appointments reviewed: PCP Follow-up appointment confirmed?: Yes Date of PCP follow-up appointment?: 11/05/24 Follow-up Provider: Called with patient to schedule hospital f/u with PCP, Jamee Antonio [facility] Follow-up appointment confirmed?: NA Do you need transportation to your follow-up appointment?: No Do you understand care options if your condition(s) worsen?: Yes-patient verbalized understanding  SDOH Interventions Today    Flowsheet Row Most Recent Value  SDOH Interventions   Food Insecurity Interventions Intervention Not Indicated  Housing Interventions Intervention Not Indicated  Transportation Interventions Intervention Not Indicated  Utilities Interventions Intervention Not Indicated    Goals Addressed             This Visit's Progress    VBCI Transitions of Care (TOC) Care Plan       Problems:  Recent Hospitalization for treatment of Community acquired bacterial pneumonia   Goal:  Over the next 30 days, the patient will not experience hospital readmission  Interventions:  Transitions of Care: Doctor Visits  - discussed the importance of doctor visits Arranged PCP follow-up within 12-14 days TOC RN assisted in conference call with patient - scheduled 11/05/24 Call provider if diarrhea continues or worsens and drink fluids to replace fluids/electrolytes - educated on foods to help with diarrhea Reviewed rescheduling eye exam (missed during hospital stay) related to diabetes  COPD Interventions: Assessed social determinant of health barriers Provided instruction about proper use of medications used for management of COPD including inhalers Screening for signs and symptoms of depression related to chronic disease state  Reviewed  rinsing mouth after Breztri use  Reviewed use of rescue inhaler  Diabetes Interventions: Assessed patient's understanding of A1c goal:Maintain <6.5% Reviewed medications with patient and discussed importance of medication adherence Discussed plans with patient for ongoing care management follow up and provided patient with direct contact information for care management team Lab Results  Component Value Date   HGBA1C 6.2 10/04/2024  Patient was taking steroid prior to hospitalization and was discharged with Prednisone  - patient had not checked sugar today but states she will check tomorrow and daily  Patient Self Care Activities:  Attend all scheduled provider appointments Call pharmacy for medication refills 3-7 days in advance of running out of medications Call provider office for new concerns or questions  Notify RN Care Manager of [facility] call rescheduling needs Participate in Transition of Care Program/Attend TOC scheduled calls Take medications as prescribed   schedule appointment with eye doctor take the blood sugar log to all doctor visits  limit outdoor activity during cold weather eliminate symptom triggers at home keep follow-up appointments: PCP hospital follow up 11/06/23 Monitor buttocks for skin breakdown related to diarrhea that patient feels is caused by antibiotic and feels this should resolve since she has completed the med  Plan:  Telephone follow up appointment with care management team member scheduled for:  10/27/24 late am The patient has been provided with contact information for the care management team and has been advised to call with any health related questions or concerns.         Shona Prow RN, CCM [place]  VBCI-Population Health RN Care Manager [phone]  "

## 2024-10-22 NOTE — Patient Instructions (Signed)
 Visit Information  Thank you for taking time to visit with me today. Please don't hesitate to contact me if I can be of assistance to you before our next scheduled telephone appointment.  Our next appointment is by telephone on 10/27/2024 late am   Following is a copy of your care plan:   Goals Addressed             This Visit's Progress    VBCI Transitions of Care (TOC) Care Plan       Problems:  Recent Hospitalization for treatment of Community acquired bacterial pneumonia   Goal:  Over the next 30 days, the patient will not experience hospital readmission  Interventions:  Transitions of Care: Doctor Visits  - discussed the importance of doctor visits Arranged PCP follow-up within 12-14 days [facility] RN assisted in conference call with patient - scheduled 11/05/24 Call provider if diarrhea continues or worsens and drink fluids to replace fluids/electrolytes - educated on foods to help with diarrhea Reviewed rescheduling eye exam (missed during hospital stay) related to diabetes  COPD Interventions: Assessed social determinant of health barriers Provided instruction about proper use of medications used for management of COPD including inhalers Screening for signs and symptoms of depression related to chronic disease state  Reviewed rinsing mouth after Breztri use  Reviewed use of rescue inhaler  Diabetes Interventions: Assessed patient's understanding of A1c goal:Maintain <6.5% Reviewed medications with patient and discussed importance of medication adherence Discussed plans with patient for ongoing care management follow up and provided patient with direct contact information for care management team Lab Results  Component Value Date   HGBA1C 6.2 10/04/2024  Patient was taking steroid prior to hospitalization and was discharged with Prednisone  - patient had not checked sugar today but states she will check tomorrow and daily  Patient Self Care Activities:  Attend all scheduled  provider appointments Call pharmacy for medication refills 3-7 days in advance of running out of medications Call provider office for new concerns or questions  Notify RN Care Manager of [facility] call rescheduling needs Participate in Transition of Care Program/Attend TOC scheduled calls Take medications as prescribed   schedule appointment with eye doctor take the blood sugar log to all doctor visits  limit outdoor activity during cold weather eliminate symptom triggers at home keep follow-up appointments: PCP hospital follow up 11/06/23 Monitor buttocks for skin breakdown related to diarrhea that patient feels is caused by antibiotic and feels this should resolve since she has completed the med  Plan:  Telephone follow up appointment with care management team member scheduled for:  10/27/24 late am The patient has been provided with contact information for the care management team and has been advised to call with any health related questions or concerns.         Patient verbalizes understanding of instructions and care plan provided today and agrees to view in MyChart. Active MyChart status and patient understanding of how to access instructions and care plan via MyChart confirmed with patient.     Telephone follow up appointment with care management team member scheduled for: 10/27/2024 The patient has been provided with contact information for the care management team and has been advised to call with any health related questions or concerns.   Please call the care guide team at [phone] if you need to cancel or reschedule your appointment.   Please call the Suicide and Crisis Lifeline: 988 call 1-800-273-TALK (toll free, 24 hour hotline) call 911 if you are experiencing a  Mental Health or Behavioral Health Crisis or need someone to talk to.  Shona Prow RN, CCM [place]  VBCI-Population Health RN Care Manager [phone]

## 2024-10-27 ENCOUNTER — Other Ambulatory Visit: Payer: Self-pay | Admitting: Family Medicine

## 2024-10-27 ENCOUNTER — Other Ambulatory Visit: Payer: Self-pay

## 2024-10-27 ENCOUNTER — Telehealth: Payer: Self-pay

## 2024-10-27 MED ORDER — ALENDRONATE SODIUM 70 MG PO TABS: 70.0000 mg | ORAL_TABLET | ORAL | 3 refills | Status: AC

## 2024-10-27 NOTE — Transitions of Care (Post Inpatient/ED Visit) (Signed)
 " Transition of Care week 2  Visit Note  10/27/2024  Name: Michelle Long MRN: [id number]          DOB: Dec 28, 1945  Situation: Patient enrolled in [facility] 30-day program. Visit completed with patient by telephone.   Background: Admit/Discharge Date:   12/28 -12/31 [facility] Primary Diagnosis: Community acquired bacterial pneumonia  Initial Transition Care Management Follow-up Telephone Call Discharge Date and Diagnosis: 10/20/24, Community acquired bacterial pneumonia   Past Medical History:  Diagnosis Date   Anxiety    Asthma    Bipolar disorder (HCC)    Bladder cancer (HCC)    Breast cancer (HCC)    Cancer of lower-inner quadrant of left female breast (HCC) 12/08/2013   COPD (chronic obstructive pulmonary disease) (HCC)    former smoker, quit 1999   Depression    Diabetes mellitus    GERD (gastroesophageal reflux disease)    Hepatitis B infection    History of transfusion of whole blood    Approximately 2009, due to breast cancer/chemo.   Hyperlipidemia    Hypertension    Personal history of chemotherapy    Personal history of radiation therapy    PONV (postoperative nausea and vomiting)     Assessment: Patient Reported Symptoms: Cognitive Cognitive Status: No symptoms reported, Normal speech and language skills, Alert and oriented to person, place, and time      Neurological Neurological Review of Symptoms: No symptoms reported    HEENT HEENT Symptoms Reported: No symptoms reported      Cardiovascular Cardiovascular Symptoms Reported: No symptoms reported    Respiratory Respiratory Symptoms Reported: No symptoms reported Other Respiratory Symptoms: patient reports very little sputum now and it is clear    Endocrine Endocrine Symptoms Reported: No symptoms reported Is patient diabetic?: Yes Is patient checking blood sugars at home?: Yes List most recent blood sugar readings, include date and time of day: Patient reports sugar is back to normal and was 85  yesterday Endocrine Self-Management Outcome: 4 (good)  Gastrointestinal Gastrointestinal Symptoms Reported: No symptoms reported Additional Gastrointestinal Details: patient states diarrhea has resolved      Genitourinary Genitourinary Symptoms Reported: No symptoms reported    Integumentary Integumentary Symptoms Reported: No symptoms reported Other Integumentary Symptoms: Patient states the Desitin helped and no further issues    Musculoskeletal Musculoskelatal Symptoms Reviewed: No symptoms reported        Psychosocial Psychosocial Symptoms Reported: No symptoms reported         There were no vitals filed for this visit. Pain Scale: 0-10 Pain Score: 0-No pain  Medications Reviewed Today     Reviewed by Lauro Shona LABOR, RN (Registered Nurse) on 10/27/24 at 1036  Med List Status: <None>   Medication Order Taking? Sig Documenting Provider Last Dose Status Informant  albuterol  (VENTOLIN  HFA) 108 (90 Base) MCG/ACT inhaler [id number] Yes INHALE 2 PUFFS EVERY 6 HOURS AS NEEDED FOR WHEEZING OR SHORTNESS OF BREATH Antonio Cyndee Jamee JONELLE, DO  Active   Alcohol Swabs (DROPSAFE ALCOHOL PREP) 70 % PADS [id number] Yes To use before checking blood sugars Antonio Cyndee, Jamee JONELLE, DO  Active   alendronate  (FOSAMAX ) 70 MG tablet [id number] Yes Take 1 tablet (70 mg total) by mouth once a week. Take with full glass of water on an empty stomach Antonio Cyndee Jamee JONELLE, DO  Active   aspirin 81 MG tablet [id number] Yes Take 81 mg by mouth daily. [provider]  Active   benzonatate (TESSALON) 200 MG capsule [id number] Yes Take 200  mg by mouth 3 (three) times daily as needed for cough. [provider]  Active   Blood Glucose Calibration (TRUE METRIX LEVEL 1) Low SOLN [id number] Yes USE AS DIRECTED WITH GLUCOSE METER Antonio Meth, Yvonne R, DO  Active   Blood Glucose Monitoring Suppl (TRUE METRIX AIR GLUCOSE METER) w/Device KIT [id number] Yes Check blood sugars once daily Lowne Chase, Yvonne R, DO   Active   BREZTRI AEROSPHERE 160-9-4.8 MCG/ACT AERO inhaler [id number] Yes Inhale 2 puffs into the lungs. [provider]  Active   Calcium  Carb-Cholecalciferol (CALCIUM  1000 + D PO) [id number]  Take by mouth 2 (two) times daily.  Patient not taking: Reported on 10/27/2024   [provider]  Active            Med Note CECILLE, STACEY I   Tue Dec 23, 2023 11:51 AM) Calcium  also contains Magnesium and Zinc.  Cholecalciferol (VITAMIN D ) 2000 units CAPS [id number] Yes Take 2,000 Units by mouth daily. [provider]  Active   citalopram  (CELEXA ) 10 MG tablet [id number] Yes Take 0.5 tablets (5 mg total) by mouth at bedtime. Lowne Chase, Yvonne R, DO  Active   ezetimibe  (ZETIA ) 10 MG tablet [id number] Yes Take 1 tablet (10 mg total) by mouth daily. Antonio Meth, Yvonne R, DO  Active   glucose blood (TRUE METRIX BLOOD GLUCOSE TEST) test strip [id number] Yes TEST BLOOD SUGAR EVERY DAY Antonio Meth, Yvonne R, DO  Active   LORazepam  (ATIVAN ) 0.5 MG tablet [id number] Yes Take 0.5 mg by mouth daily as needed for anxiety.  Patient taking differently: Take 0.5 mg by mouth daily as needed for anxiety. DC summary from 12/31 discharge shows 1mg  - patient confirmed   [provider]  Active   losartan  (COZAAR ) 50 MG tablet [id number] Yes TAKE 1 TABLET BY MOUTH DAILY Antonio Meth, Yvonne R, DO  Active   metFORMIN  (GLUCOPHAGE ) 1000 MG tablet [id number] Yes TAKE 1 TABLET BY MOUTH TWICE  DAILY WITH MEALS Antonio Meth, Yvonne R, DO  Active   methylPREDNISolone  (MEDROL  DOSEPAK) 4 MG TBPK tablet [id number]  As directed  Patient not taking: Reported on 10/22/2024   Webb, Padonda B, FNP  Consider Medication Status and Discontinue (Completed Course)   Multiple Vitamin (MULTIVITAMIN ADULT PO) [id number] Yes Take 1 capsule by mouth daily at 6 (six) AM. [provider]  Active   nateglinide  (STARLIX ) 60 MG tablet [id number] Yes TAKE 1 TABLET BY MOUTH TWICE  DAILY WITH A MEAL Lowne Chase, Yvonne R, DO   Active   OLANZapine  (ZYPREXA ) 5 MG tablet [id number] Yes 1/2 tablet daily  Patient taking differently: Take 5 mg by mouth daily. 1 tablet daily @ bedtime   Antonio Meth, Yvonne R, DO  Active Self  pantoprazole  (PROTONIX ) 40 MG tablet [id number] Yes Take 1 tablet (40 mg total) by mouth daily. Lowne Chase, Yvonne R, DO  Active   predniSONE  (DELTASONE ) 20 MG tablet [id number]  Take 20 mg by mouth daily with breakfast.  Patient not taking: Reported on 10/27/2024   [provider]  Consider Medication Status and Discontinue (Completed Course)   rosuvastatin  (CRESTOR ) 5 MG tablet [id number] Yes TAKE 1 TABLET BY MOUTH 3 TIMES  WEEKLY Antonio Meth Jamee JONELLE, DO  Active   Spacer/Aero Chamber [place] MISC [id number] Yes To use with inhaler Lowne Chase, Yvonne R, DO  Active   TRUEplus Lancets 33G MISC [id number] Yes TEST BLOOD SUGAR EVERY DAY Antonio Meth Jamee JONELLE, DO  Active  Recommendation:   Continue Current Plan of Care  Follow Up Plan:   Telephone follow up appointment date/time:  11/04/24 in am  Shona Prow RN, CCM [place]  VBCI-Population Health RN Care Manager [phone]     "

## 2024-10-27 NOTE — Telephone Encounter (Signed)
 Error sent. [facility]  was already refilled to [facility] on 10/20/24,

## 2024-10-27 NOTE — Telephone Encounter (Signed)
 Copied from CRM [id number]. Topic: Clinical - Medication Refill >> Oct 27, 2024  8:08 AM Deleta RAMAN wrote: Medication: nateglinide  (STARLIX ) 60 MG tablet  Has the patient contacted their pharmacy? Yes (Agent: If no, request that the patient contact the pharmacy for the refill. If patient does not wish to contact the pharmacy document the reason why and proceed with request.) (Agent: If yes, when and what did the pharmacy advise?)  This is the patient's preferred pharmacy:    [facility] - [place], [place] - [address] [place] [address] Phone: [phone] Fax: [phone]  Is this the correct pharmacy for this prescription? Yes If no, delete pharmacy and type the correct one.   Has the prescription been filled recently? Yes  Is the patient out of the medication? Yes  Has the patient been seen for an appointment in the last year OR does the patient have an upcoming appointment? Yes  Can we respond through MyChart? Yes  Agent: Please be advised that Rx refills may take up to 3 business days. We ask that you follow-up with your pharmacy.

## 2024-10-27 NOTE — Patient Instructions (Signed)
 Visit Information  Thank you for taking time to visit with me today. Please don't hesitate to contact me if I can be of assistance to you before our next scheduled telephone appointment.  Our next appointment is by telephone on 11/04/24 in the am  Following is a copy of your care plan:   Goals Addressed             This Visit's Progress    VBCI Transitions of Care (TOC) Care Plan       Problems:  Recent Hospitalization for treatment of Community acquired bacterial pneumonia   Goal:  Over the next 30 days, the patient will not experience hospital readmission  Interventions:  Transitions of Care: Doctor Visits  - discussed the importance of doctor visits Arranged PCP follow-up within 12-14 days TOC RN assisted in conference call with patient - scheduled 11/05/24 Call provider if diarrhea continues or worsens and drink fluids to replace fluids/electrolytes - educated on foods to help with diarrhea Reviewed rescheduling eye exam (missed during hospital stay) related to diabetes Update 10/27/24: Patient states she is doing well and feeling better and reports her sugar was 85 and she feels it has gotten back to normal since completing steroids. Patient agreed to additional follow up call that was scheduled. After completing initial call, patient called back a little later and said she feels weak. Patient denied need to go to MD or UC and said she has no other symptoms and says she had been laying down and when getting up she felt weak. TOC RN educated member to increase activity each day as tolerated which can be standing up every hour and walking to bathroom or kitchen. Patient states she does feel she has been laying too much and will try moving more. Patient states she will call if anything worsens and agreed to keep scheduled appt as is on 11/04/24  COPD Interventions: Assessed social determinant of health barriers Provided instruction about proper use of medications used for management of  COPD including inhalers Screening for signs and symptoms of depression related to chronic disease state  Reviewed rinsing mouth after Breztri use  Reviewed use of rescue inhaler  Diabetes Interventions: Assessed patient's understanding of A1c goal:Maintain <6.5% Reviewed medications with patient and discussed importance of medication adherence Discussed plans with patient for ongoing care management follow up and provided patient with direct contact information for care management team Lab Results  Component Value Date   HGBA1C 6.2 10/04/2024  Patient was taking steroid prior to hospitalization and was discharged with Prednisone  - patient had not checked sugar today but states she will check tomorrow and daily  Patient Self Care Activities:  Attend all scheduled provider appointments Call pharmacy for medication refills 3-7 days in advance of running out of medications Call provider office for new concerns or questions  Notify RN Care Manager of [facility] call rescheduling needs Participate in Transition of Care Program/Attend TOC scheduled calls Take medications as prescribed   schedule appointment with eye doctor take the blood sugar log to all doctor visits  limit outdoor activity during cold weather eliminate symptom triggers at home keep follow-up appointments: PCP hospital follow up 11/06/23 Monitor buttocks for skin breakdown related to diarrhea that patient feels is caused by antibiotic and feels this should resolve since she has completed the med  Plan:  Telephone follow up appointment with care management team member scheduled for:  11/04/24 late am The patient has been provided with contact information for the care management  team and has been advised to call with any health related questions or concerns.         Patient verbalizes understanding of instructions and care plan provided today and agrees to view in MyChart. Active MyChart status and patient understanding of how to  access instructions and care plan via MyChart confirmed with patient.     Telephone follow up appointment with care management team member scheduled for: 11/04/24 The patient has been provided with contact information for the care management team and has been advised to call with any health related questions or concerns.   Please call the care guide team at [phone] if you need to cancel or reschedule your appointment.   Please call the Suicide and Crisis Lifeline: 988 call 1-800-273-TALK (toll free, 24 hour hotline) call 911 if you are experiencing a Mental Health or Behavioral Health Crisis or need someone to talk to. Shona Prow RN, CCM [place]  VBCI-Population Health RN Care Manager [phone]

## 2024-10-29 ENCOUNTER — Telehealth: Payer: Self-pay

## 2024-10-29 NOTE — Telephone Encounter (Signed)
 Pt requesting to get at time of her OV on 11/05/24

## 2024-10-29 NOTE — Telephone Encounter (Signed)
 Copied from CRM #[id number]. Topic: General - Other >> Oct 29, 2024 10:49 AM Tinnie BROCKS wrote: Reason for CRM: Pt is wondering if she can get a handicap sticker ordered. She will be coming in for HFU visit next week to speak about this but was wondering if this could be started for her. #[phone]

## 2024-11-01 ENCOUNTER — Other Ambulatory Visit: Payer: Self-pay | Admitting: Family Medicine

## 2024-11-01 DIAGNOSIS — E119 Type 2 diabetes mellitus without complications: Secondary | ICD-10-CM

## 2024-11-04 ENCOUNTER — Other Ambulatory Visit (HOSPITAL_COMMUNITY)
Admission: RE | Admit: 2024-11-04 | Discharge: 2024-11-04 | Disposition: A | Discharge status: Home / Self Care | Payer: Self-pay | Source: Ambulatory Visit | Attending: Medical Genetics | Admitting: Medical Genetics

## 2024-11-04 ENCOUNTER — Telehealth: Payer: Self-pay

## 2024-11-04 NOTE — Transitions of Care (Post Inpatient/ED Visit) (Signed)
" °  Transition of Care week 3  Visit Note  11/04/2024  Name: Michelle Long MRN: [id number]          DOB: 22-Mar-1946  Situation: Patient enrolled in [facility] 30-day program. Visit completed with patient by telephone.   Background: Admit/Discharge Date:   12/28 -12/31 [facility] Primary Diagnosis: Community acquired bacterial pneumonia  Initial Transition Care Management Follow-up Telephone Call Discharge Date and Diagnosis: 10/20/24, Community acquired bacterial pneumonia   Past Medical History:  Diagnosis Date   Anxiety    Asthma    Bipolar disorder (HCC)    Bladder cancer (HCC)    Breast cancer (HCC)    Cancer of lower-inner quadrant of left female breast (HCC) 12/08/2013   COPD (chronic obstructive pulmonary disease) (HCC)    former smoker, quit 1999   Depression    Diabetes mellitus    GERD (gastroesophageal reflux disease)    Hepatitis B infection    History of transfusion of whole blood    Approximately 2009, due to breast cancer/chemo.   Hyperlipidemia    Hypertension    Personal history of chemotherapy    Personal history of radiation therapy    PONV (postoperative nausea and vomiting)     Assessment: Patient Reported Symptoms: Patient stated she is back to her old self and denies need for continued calls - unable to complete assessments or review medications.. Patient has PCP appt tommorow.  Recommendation:   Patient will continue to follow up with provider  Follow Up Plan:   Closing From:  Transitions of Care Program  Shona Prow RN, CCM [facility]  VBCI-Population Health RN Care Manager [phone]     "

## 2024-11-05 ENCOUNTER — Encounter: Payer: Self-pay | Admitting: Family Medicine

## 2024-11-05 ENCOUNTER — Ambulatory Visit (INDEPENDENT_AMBULATORY_CARE_PROVIDER_SITE_OTHER): Admitting: Family Medicine

## 2024-11-05 VITALS — BP 132/66 | HR 101 | Temp 97.9°F | Resp 18 | Ht 63.0 in | Wt 145.4 lb

## 2024-11-05 DIAGNOSIS — J189 Pneumonia, unspecified organism: Secondary | ICD-10-CM

## 2024-11-05 NOTE — Patient Instructions (Signed)
 Community-Acquired Pneumonia, Adult Pneumonia is a lung infection that causes inflammation and the buildup of mucus and fluids in the lungs. This may cause coughing and difficulty breathing. Community-acquired pneumonia is pneumonia that develops in people who are not, and have not recently been, in a hospital or other health care facility. Usually, pneumonia develops as a result of an illness that is caused by a virus, such as the common cold and the flu (influenza). It can also be caused by bacteria or fungi. While the common cold and influenza can pass from person to person (are contagious), pneumonia itself is not considered contagious. What are the causes? This condition may be caused by: Viruses. Bacteria. Fungi. What increases the risk? The following factors may make you more likely to develop this condition: Being over age 40 or having certain medical conditions, such as: A long-term (chronic) disease, such as: chronic obstructive pulmonary disease (COPD), asthma, heart failure, diabetes, or kidney disease. A condition that increases the risk of breathing in (aspirating) mucus and other fluids from your mouth and nose. A weakened body defense system (immune system). Having had your spleen removed (splenectomy). The spleen is the organ that helps fight germs and infections. Not cleaning your teeth and gums well (poor dental hygiene). Using tobacco products. Traveling to places where germs that cause pneumonia are present or being near certain animals or animal habitats that could have germs that cause pneumonia. What are the signs or symptoms? Symptoms of this condition include: A dry cough or a wet (productive) cough. A fever, sweating, or chills. Chest pain, especially when breathing deeply or coughing. Fast breathing, difficulty breathing, or shortness of breath. Tiredness (fatigue) and muscle aches. How is this diagnosed? This condition may be diagnosed based on your medical  history or a physical exam. You may also have tests, including: Imaging, such as a chest X-ray or lung ultrasound. Tests of: The level of oxygen and other gases in your blood. Mucus from your lungs (sputum). Fluid around your lungs (pleural fluid). Your urine. How is this treated? Treatment for this condition depends on many factors, such as the cause of your pneumonia, your medicines, and other medical conditions that you have. For most adults, pneumonia may be treated at home. In some cases, treatment must happen in a hospital and may include: Medicines that are given by mouth (orally) or through an IV, including: Antibiotic medicines, if bacteria caused the pneumonia. Medicines that kill viruses (antiviral medicines), if a virus caused the pneumonia. Oxygen therapy. Severe pneumonia, although rare, may require the following treatments: Mechanical ventilation.This procedure uses a machine to help you breathe if you cannot breathe well on your own or maintain a safe level of blood oxygen. Thoracentesis. This procedure removes any buildup of pleural fluid to help with breathing. Follow these instructions at home:  Medicines Take over-the-counter and prescription medicines only as told by your health care provider. Take cough medicine only if you have trouble sleeping. Cough medicine can prevent your body from removing mucus from your lungs. If you were prescribed antibiotics, take them as told by your health care provider. Do not stop taking the antibiotic even if you start to feel better. Lifestyle     Do not drink alcohol. Do not use any products that contain nicotine or tobacco. These products include cigarettes, chewing tobacco, and vaping devices, such as e-cigarettes. If you need help quitting, ask your health care provider. Eat a healthy diet. This includes plenty of vegetables, fruits, whole grains, low-fat  dairy products, and lean protein. General instructions Rest a lot and  get at least 8 hours of sleep each night. Sleep in a partly upright position at night. Place a few pillows under your head or sleep in a reclining chair. Return to your normal activities as told by your health care provider. Ask your health care provider what activities are safe for you. Drink enough fluid to keep your urine pale yellow. This helps to thin the mucus in your lungs. If your throat is sore, gargle with a mixture of salt and water 3-4 times a day or as needed. To make salt water, completely dissolve -1 tsp (3-6 g) of salt in 1 cup (237 mL) of warm water. Keep all follow-up visits. How is this prevented? You can lower your risk of developing community-acquired pneumonia by: Getting the pneumonia vaccine. There are different types and schedules of pneumonia vaccines. Ask your health care provider which option is best for you. Consider getting the pneumonia vaccine if: You are older than 79 years of age. You are 67-15 years of age and are receiving cancer treatment, have chronic lung disease, or have other medical conditions that affect your immune system. Ask your health care provider if this applies to you. Getting your influenza vaccine every year. Ask your health care provider which type of vaccine is best for you. Getting regular dental checkups. Washing your hands often with soap and water for at least 20 seconds. If soap and water are not available, use hand sanitizer. Contact a health care provider if: You have a fever. You have trouble sleeping because you cannot control your cough with cough medicine. Get help right away if: Your shortness of breath becomes worse. Your chest pain increases. Your sickness becomes worse, especially if you are an older adult or have a weak immune system. You cough up blood. These symptoms may be an emergency. Get help right away. Call 911. Do not wait to see if the symptoms will go away. Do not drive yourself to the  hospital. Summary Pneumonia is an infection of the lungs. Community-acquired pneumonia develops in people who have not been in the hospital. It can be caused by bacteria, viruses, or fungi. This condition may be treated with antibiotics or antiviral medicines. Severe pneumonia may require a hospital stay and treatment to help with breathing. This information is not intended to replace advice given to you by your health care provider. Make sure you discuss any questions you have with your health care provider. Document Revised: 09/10/2023 Document Reviewed: 12/05/2021 Elsevier Patient Education  2025 [employer].

## 2024-11-05 NOTE — Assessment & Plan Note (Signed)
 Resolved Pt with no new symptoms

## 2024-11-05 NOTE — Progress Notes (Signed)
 "  Subjective:    Patient ID: Michelle Long, female    DOB: 18-Aug-1946, 79 y.o.   MRN: [id number]  Chief Complaint  Patient presents with   Hospitalization Follow-up    HPI Patient is in today for hosp f/u.  Discussed the use of AI scribe software for clinical note transcription with the patient, who gave verbal consent to proceed.  History of Present Illness Michelle Long is a 79 year old female who presents for follow-up after hospitalization for right upper lobe pneumonia.  She was hospitalized on December 28th for right upper lobe pneumonia and discharged on December 31st. During her illness, she experienced chest pain localized to the right upper chest, confusion, and difficulty walking. Her daughter noted episodes of altered consciousness. No home oxygen was required upon discharge.  She completed a course of antibiotics prescribed at discharge within two to three days and reports no need for refills. She has multiple inhalers at home and recently filled her prescription for alendronate . During her hospitalization, she experienced low magnesium levels.  She was confused for about a week after her discharge, unable to remember events, and frequently asked her daughters for details about her hospital stay.  Prior to her pneumonia, she had bronchitis and was on prednisone , which she completed on the day she was admitted to the hospital. No current chest pain or swelling in her legs. She reports no recent exposure to sick individuals.    Past Medical History:  Diagnosis Date   Anxiety    Asthma    Bipolar disorder [facility])    Bladder cancer (HCC)    Breast cancer (HCC)    Cancer of lower-inner quadrant of left female breast (HCC) 12/08/2013   COPD (chronic obstructive pulmonary disease) (HCC)    former smoker, quit 1999   Depression    Diabetes mellitus    GERD (gastroesophageal reflux disease)    Hepatitis B infection    History of transfusion of whole blood    Approximately 2009,  due to breast cancer/chemo.   Hyperlipidemia    Hypertension    Personal history of chemotherapy    Personal history of radiation therapy    PONV (postoperative nausea and vomiting)     Past Surgical History:  Procedure Laterality Date   ABDOMINAL HYSTERECTOMY     APPENDECTOMY  1961   bladder cancer  2006   BREAST LUMPECTOMY Left 2007   BREAST LUMPECTOMY WITH RADIOACTIVE SEED LOCALIZATION Right 03/09/2021   Procedure: RIGHT BREAST LUMPECTOMY WITH RADIOACTIVE SEED LOCALIZATION;  Surgeon: Curvin Deward MOULD, MD;  Location: [facility];  Service: General;  Laterality: Right;   PARTIAL HYSTERECTOMY  1991   TONSILLECTOMY  1951    Family History  Problem Relation Age of Onset   Arthritis Mother    Breast cancer Mother    Transient ischemic attack Mother    Hypertension Mother    Heart disease Father    Diabetes Father    Cancer Father        bladder cancer   Throat cancer Father    Breast cancer Sister     Social History   Socioeconomic History   Marital status: Divorced    Spouse name: Not on file   Number of children: Not on file   Years of education: Not on file   Highest education level: Bachelor's degree (e.g., BA, AB, BS)  Occupational History   Occupation: retired    Comment: retired  Tobacco Use   Smoking  status: Former    Current packs/day: 0.00    Average packs/day: 1.5 packs/day for 33.7 years (50.6 ttl pk-yrs)    Types: Cigarettes    Start date: 12/08/1964    Quit date: 08/21/1998    Years since quitting: 26.2   Smokeless tobacco: Never   Tobacco comments:    quit 16 years ago  Vaping Use   Vaping status: Never Used  Substance and Sexual Activity   Alcohol use: Not Currently    Alcohol/week: 0.0 standard drinks of alcohol   Drug use: No   Sexual activity: Not Currently    Partners: Male    Birth control/protection: Surgical  Other Topics Concern   Not on file  Social History Narrative   Exercise--- no   Social Drivers of Health    Tobacco Use: Medium Risk (11/05/2024)   Patient History    Smoking Tobacco Use: Former    Smokeless Tobacco Use: Never    Passive Exposure: Not on file  Financial Resource Strain: Low Risk (10/18/2024)   Received from [facility]   Overall Financial Resource Strain (CARDIA)    How hard is it for you to pay for the very basics like food, housing, medical care, and heating?: Not hard at all  Food Insecurity: No Food Insecurity (10/22/2024)   Epic    Worried About Radiation Protection Practitioner of Food in the Last Year: Never true    Ran Out of Food in the Last Year: Never true  Transportation Needs: No Transportation Needs (10/22/2024)   Epic    Lack of Transportation (Medical): No    Lack of Transportation (Non-Medical): No  Physical Activity: Inactive (10/12/2024)   Exercise Vital Sign    Days of Exercise per Week: 0 days    Minutes of Exercise per Session: 0 min  Stress: No Stress Concern Present (10/12/2024)   [employer] of Occupational Health - Occupational Stress Questionnaire    Feeling of Stress: Not at all  Social Connections: Moderately Integrated (10/18/2024)   Received from [facility]   Social Connection and Isolation Panel    In a typical week, how many times do you talk on the phone with family, friends, or neighbors?: Three times a week    How often do you get together with friends or relatives?: Three times a week    How often do you attend church or religious services?: More than 4 times per year    Do you belong to any clubs or organizations such as church groups, unions, fraternal or athletic groups, or school groups?: Yes    How often do you attend meetings of the clubs or organizations you belong to?: More than 4 times per year    Are you married, widowed, divorced, separated, never married, or living with a partner?: Divorced  Intimate Partner Violence: Not At Risk (10/22/2024)   Epic    Fear of Current or Ex-Partner: No    Emotionally Abused: No    Physically Abused:  No    Sexually Abused: No  Depression (PHQ2-9): Low Risk (10/12/2024)   Depression (PHQ2-9)    PHQ-2 Score: 1  Alcohol Screen: Low Risk (10/09/2023)   Alcohol Screen    Last Alcohol Screening Score (AUDIT): 0  Housing: Low Risk (10/22/2024)   Epic    Unable to Pay for Housing in the Last Year: No    Number of Times Moved in the Last Year: 0    Homeless in the Last Year: No  Utilities: Not At Risk (  10/22/2024)   Epic    Threatened with loss of utilities: No  Health Literacy: Adequate Health Literacy (10/09/2023)   B1300 Health Literacy    Frequency of need for help with medical instructions: Never    Outpatient Medications Prior to Visit  Medication Sig Dispense Refill   albuterol  (VENTOLIN  HFA) 108 (90 Base) MCG/ACT inhaler INHALE 2 PUFFS EVERY 6 HOURS AS NEEDED FOR WHEEZING OR SHORTNESS OF BREATH 1 each 1   Alcohol Swabs (DROPSAFE ALCOHOL PREP) 70 % PADS To use before checking blood sugars 200 each 12   alendronate  (FOSAMAX ) 70 MG tablet Take 1 tablet (70 mg total) by mouth once a week. Take with full glass of water on an empty stomach 12 tablet 3   aspirin 81 MG tablet Take 81 mg by mouth daily.     benzonatate (TESSALON) 200 MG capsule Take 200 mg by mouth 3 (three) times daily as needed for cough.     Blood Glucose Calibration (TRUE METRIX LEVEL 1) Low SOLN USE AS DIRECTED WITH GLUCOSE METER 1 each PRN   Blood Glucose Monitoring Suppl (TRUE METRIX AIR GLUCOSE METER) w/Device KIT Check blood sugars once daily 1 kit 0   BREZTRI AEROSPHERE 160-9-4.8 MCG/ACT AERO inhaler Inhale 2 puffs into the lungs.     Cholecalciferol (VITAMIN D ) 2000 units CAPS Take 2,000 Units by mouth daily.     citalopram  (CELEXA ) 10 MG tablet Take 0.5 tablets (5 mg total) by mouth at bedtime. 45 tablet 1   ezetimibe  (ZETIA ) 10 MG tablet TAKE 1 TABLET BY MOUTH DAILY 100 tablet 2   glucose blood (TRUE METRIX BLOOD GLUCOSE TEST) test strip CHECK BLOOD SUGAR DAILY 100 strip 2   LORazepam  (ATIVAN ) 0.5 MG tablet Take  0.5 mg by mouth daily as needed for anxiety. (Patient taking differently: Take 0.5 mg by mouth daily as needed for anxiety. DC summary from 12/31 discharge shows 1mg  - patient confirmed)     losartan  (COZAAR ) 50 MG tablet TAKE 1 TABLET BY MOUTH DAILY 100 tablet 1   metFORMIN  (GLUCOPHAGE ) 1000 MG tablet TAKE 1 TABLET BY MOUTH TWICE  DAILY WITH MEALS 200 tablet 2   Multiple Vitamin (MULTIVITAMIN ADULT PO) Take 1 capsule by mouth daily at 6 (six) AM.     nateglinide  (STARLIX ) 60 MG tablet TAKE 1 TABLET BY MOUTH TWICE  DAILY WITH A MEAL 200 tablet 0   OLANZapine  (ZYPREXA ) 5 MG tablet 1/2 tablet daily (Patient taking differently: Take 5 mg by mouth daily. 1 tablet daily @ bedtime) 45 tablet 1   pantoprazole  (PROTONIX ) 40 MG tablet Take 1 tablet (40 mg total) by mouth daily. 90 tablet 3   rosuvastatin  (CRESTOR ) 5 MG tablet TAKE 1 TABLET BY MOUTH 3 TIMES  WEEKLY 36 tablet 3   Spacer/Aero Chamber Mouthpiece MISC To use with inhaler 1 each 1   TRUEplus Lancets 33G MISC TEST BLOOD SUGAR DAILY 100 each 2   Calcium  Carb-Cholecalciferol (CALCIUM  1000 + D PO) Take by mouth 2 (two) times daily. (Patient not taking: Reported on 11/05/2024)     methylPREDNISolone  (MEDROL  DOSEPAK) 4 MG TBPK tablet As directed (Patient not taking: Reported on 10/22/2024) 21 tablet 0   predniSONE  (DELTASONE ) 20 MG tablet Take 20 mg by mouth daily with breakfast. (Patient not taking: Reported on 10/27/2024)     No facility-administered medications prior to visit.    Allergies  Allergen Reactions   Fluoxetine Other (See Comments)    seizure   Fluoxetine Hcl Other (See Comments)  seizure   Lamotrigine Itching and Other (See Comments)    itch   Sulfa Antibiotics Shortness Of Breath   Sulfa Antibiotics    Sulfa Drugs Cross Reactors Shortness Of Breath        Statins Other (See Comments)    Muscle pain  Muscle pains, Muscle pains    Review of Systems  Constitutional:  Negative for fever and malaise/fatigue.  HENT:  Negative  for congestion.   Eyes:  Negative for blurred vision.  Respiratory:  Negative for shortness of breath.   Cardiovascular:  Negative for chest pain, palpitations and leg swelling.  Gastrointestinal:  Negative for abdominal pain, blood in stool and nausea.  Genitourinary:  Negative for dysuria and frequency.  Musculoskeletal:  Negative for falls.  Skin:  Negative for rash.  Neurological:  Negative for dizziness, loss of consciousness and headaches.  Endo/Heme/Allergies:  Negative for environmental allergies.  Psychiatric/Behavioral:  Negative for depression. The patient is not nervous/anxious.        Objective:    Physical Exam Vitals and nursing note reviewed.  Constitutional:      General: She is not in acute distress.    Appearance: Normal appearance. She is well-developed.  HENT:     Head: Normocephalic and atraumatic.     Right Ear: Tympanic membrane, ear canal and external ear normal. There is no impacted cerumen.     Left Ear: Tympanic membrane, ear canal and external ear normal. There is no impacted cerumen.     Nose: Nose normal.     Mouth/Throat:     Mouth: Mucous membranes are moist.     Pharynx: Oropharynx is clear. No oropharyngeal exudate or posterior oropharyngeal erythema.  Eyes:     General: No scleral icterus.       Right eye: No discharge.        Left eye: No discharge.     Conjunctiva/sclera: Conjunctivae normal.     Pupils: Pupils are equal, round, and reactive to light.  Neck:     Thyroid : No thyromegaly or thyroid  tenderness.     Vascular: No JVD.  Cardiovascular:     Rate and Rhythm: Normal rate and regular rhythm.     Heart sounds: Normal heart sounds. No murmur heard. Pulmonary:     Effort: Pulmonary effort is normal. No respiratory distress.     Breath sounds: Normal breath sounds.  Abdominal:     General: Bowel sounds are normal. There is no distension.     Palpations: Abdomen is soft. There is no mass.     Tenderness: There is no abdominal  tenderness. There is no guarding or rebound.  Genitourinary:    Vagina: Normal.  Musculoskeletal:        General: Normal range of motion.     Cervical back: Normal range of motion and neck supple.     Right lower leg: No edema.     Left lower leg: No edema.  Lymphadenopathy:     Cervical: No cervical adenopathy.  Skin:    General: Skin is warm and dry.     Findings: No erythema or rash.  Neurological:     Mental Status: She is alert and oriented to person, place, and time.     Cranial Nerves: No cranial nerve deficit.     Deep Tendon Reflexes: Reflexes are normal and symmetric.  Psychiatric:        Mood and Affect: Mood normal.        Behavior: Behavior normal.  Thought Content: Thought content normal.        Judgment: Judgment normal.     BP 132/66 (BP Location: Right Arm, Patient Position: Sitting, Cuff Size: Normal)   Pulse (!) 101   Temp 97.9 F (36.6 C) (Oral)   Resp 18   Ht 5' 3 (1.6 m)   Wt 145 lb 6.4 oz (66 kg)   SpO2 96%   BMI 25.76 kg/m  Wt Readings from Last 3 Encounters:  11/05/24 145 lb 6.4 oz (66 kg)  10/12/24 144 lb (65.3 kg)  10/11/24 144 lb 6.4 oz (65.5 kg)    Diabetic Foot Exam - Simple   No data filed    Lab Results  Component Value Date   WBC 6.0 10/04/2024   HGB 13.0 10/04/2024   HCT 38.9 10/04/2024   PLT 188.0 10/04/2024   GLUCOSE 90 10/04/2024   CHOL 143 10/04/2024   TRIG 109.0 10/04/2024   HDL 49.40 10/04/2024   LDLDIRECT 145.6 06/22/2013   LDLCALC 71 10/04/2024   ALT 25 10/04/2024   AST 28 10/04/2024   NA 142 10/04/2024   K 4.8 10/04/2024   CL 104 10/04/2024   CREATININE 0.72 10/04/2024   BUN 12 10/04/2024   CO2 30 10/04/2024   TSH 1.43 10/04/2024   INR 0.97 09/25/2011   HGBA1C 6.2 10/04/2024   MICROALBUR 1.7 10/04/2024    Lab Results  Component Value Date   TSH 1.43 10/04/2024   Lab Results  Component Value Date   WBC 6.0 10/04/2024   HGB 13.0 10/04/2024   HCT 38.9 10/04/2024   MCV 89.6 10/04/2024   PLT  188.0 10/04/2024   Lab Results  Component Value Date   NA 142 10/04/2024   K 4.8 10/04/2024   CHLORIDE 103 01/01/2017   CO2 30 10/04/2024   GLUCOSE 90 10/04/2024   BUN 12 10/04/2024   CREATININE 0.72 10/04/2024   BILITOT 0.4 10/04/2024   ALKPHOS 35 (L) 10/04/2024   AST 28 10/04/2024   ALT 25 10/04/2024   PROT 6.6 10/04/2024   ALBUMIN 4.2 10/04/2024   CALCIUM  9.8 10/04/2024   ANIONGAP 12 12/23/2023   EGFR 70 (L) 01/01/2017   GFR 80.04 10/04/2024   Lab Results  Component Value Date   CHOL 143 10/04/2024   Lab Results  Component Value Date   HDL 49.40 10/04/2024   Lab Results  Component Value Date   LDLCALC 71 10/04/2024   Lab Results  Component Value Date   TRIG 109.0 10/04/2024   Lab Results  Component Value Date   CHOLHDL 3 10/04/2024   Lab Results  Component Value Date   HGBA1C 6.2 10/04/2024       Assessment & Plan:  Community acquired pneumonia of right upper lobe of lung Assessment & Plan: Resolved Pt with no new symptoms   Orders: -     CBC with Differential/Platelet -     Comprehensive metabolic panel with GFR -     Magnesium  Hypomagnesemia -     Magnesium   Assessment and Plan Assessment & Plan Community acquired pneumonia of right upper lobe   She was recently hospitalized for right upper lobe pneumonia, presenting with confusion and difficulty walking, likely due to hypoxia. Discharged on January 31st, 2025, without home oxygen or home health services. Completed antibiotics within 2-3 days. Currently, she has no chest pain or respiratory distress and no leg swelling. Repeated blood work to ensure stability of condition.  Hypomagnesemia   Hypomagnesemia was noted during  hospitalization and resolved within three days.  + Faren Florence R Lowne Chase, DO  "

## 2024-11-06 LAB — COMPREHENSIVE METABOLIC PANEL WITH GFR
AG Ratio: 1.8 (calc) (ref 1.0–2.5)
ALT: 24 U/L (ref 6–29)
AST: 20 U/L (ref 10–35)
Albumin: 4 g/dL (ref 3.6–5.1)
Alkaline phosphatase (APISO): 35 U/L — ABNORMAL LOW (ref 37–153)
BUN: 9 mg/dL (ref 7–25)
CO2: 27 mmol/L (ref 20–32)
Calcium: 9.1 mg/dL (ref 8.6–10.4)
Chloride: 106 mmol/L (ref 98–110)
Creat: 0.67 mg/dL (ref 0.60–1.00)
Globulin: 2.2 g/dL (ref 1.9–3.7)
Glucose, Bld: 136 mg/dL — ABNORMAL HIGH (ref 65–99)
Potassium: 4.4 mmol/L (ref 3.5–5.3)
Sodium: 141 mmol/L (ref 135–146)
Total Bilirubin: 0.3 mg/dL (ref 0.2–1.2)
Total Protein: 6.2 g/dL (ref 6.1–8.1)
eGFR: 89 mL/min/1.73m2

## 2024-11-06 LAB — CBC WITH DIFFERENTIAL/PLATELET
Absolute Lymphocytes: 1428 {cells}/uL (ref 850–3900)
Absolute Monocytes: 504 {cells}/uL (ref 200–950)
Basophils Absolute: 30 {cells}/uL (ref 0–200)
Basophils Relative: 0.5 %
Eosinophils Absolute: 108 {cells}/uL (ref 15–500)
Eosinophils Relative: 1.8 %
HCT: 38.3 % (ref 35.9–46.0)
Hemoglobin: 12.2 g/dL (ref 11.7–15.5)
MCH: 28.6 pg (ref 27.0–33.0)
MCHC: 31.9 g/dL (ref 31.6–35.4)
MCV: 89.9 fL (ref 81.4–101.7)
MPV: 11.7 fL (ref 7.5–12.5)
Monocytes Relative: 8.4 %
Neutro Abs: 3930 {cells}/uL (ref 1500–7800)
Neutrophils Relative %: 65.5 %
Platelets: 199 Thousand/uL (ref 140–400)
RBC: 4.26 Million/uL (ref 3.80–5.10)
RDW: 13.3 % (ref 11.0–15.0)
Total Lymphocyte: 23.8 %
WBC: 6 Thousand/uL (ref 3.8–10.8)

## 2024-11-06 LAB — MAGNESIUM: Magnesium: 1.6 mg/dL (ref 1.5–2.5)

## 2024-11-08 ENCOUNTER — Ambulatory Visit: Payer: Self-pay | Admitting: Family Medicine

## 2024-11-12 ENCOUNTER — Telehealth: Payer: Self-pay

## 2024-11-12 MED ORDER — ACCU-CHEK GUIDE W/DEVICE KIT: PACK | 0 refills | Status: AC

## 2024-11-12 MED ORDER — ACCU-CHEK GUIDE TEST VI STRP: ORAL_STRIP | 12 refills | Status: AC

## 2024-11-12 MED ORDER — ACCU-CHEK SOFTCLIX LANCETS MISC: 12 refills | Status: AC

## 2024-11-12 NOTE — Telephone Encounter (Signed)
 Copied from CRM #[id number]. Topic: Clinical - Prescription Issue >> Nov 12, 2024  1:39 PM Berwyn MATSU wrote: Reason for CRM: Patient needs new glucometer and test strips with lancets as [employer] no longer covers the true metrix. Per patient she needs accu-check brand.    May you please assist.

## 2024-11-15 LAB — GENECONNECT MOLECULAR SCREEN: Genetic Analysis Overall Interpretation: NEGATIVE

## 2024-11-25 ENCOUNTER — Other Ambulatory Visit: Payer: Self-pay | Admitting: Family Medicine

## 2024-11-25 DIAGNOSIS — K219 Gastro-esophageal reflux disease without esophagitis: Secondary | ICD-10-CM

## 2024-12-29 ENCOUNTER — Ambulatory Visit: Admitting: Hematology & Oncology

## 2025-01-03 ENCOUNTER — Ambulatory Visit: Admitting: Family Medicine

## 2025-10-18 ENCOUNTER — Ambulatory Visit
